# Patient Record
Sex: Female | Born: 1935 | Race: Black or African American | Hispanic: No | Smoking: Never smoker
Health system: Southern US, Community
[De-identification: ages and names within clinical notes are randomized; demographics above are authoritative.]

## PROBLEM LIST (undated history)

## (undated) DIAGNOSIS — I639 Cerebral infarction, unspecified: Secondary | ICD-10-CM

## (undated) DIAGNOSIS — T7840XA Allergy, unspecified, initial encounter: Secondary | ICD-10-CM

## (undated) DIAGNOSIS — Z801 Family history of malignant neoplasm of trachea, bronchus and lung: Secondary | ICD-10-CM

## (undated) DIAGNOSIS — K219 Gastro-esophageal reflux disease without esophagitis: Secondary | ICD-10-CM

## (undated) DIAGNOSIS — I10 Essential (primary) hypertension: Secondary | ICD-10-CM

## (undated) DIAGNOSIS — M4800 Spinal stenosis, site unspecified: Secondary | ICD-10-CM

## (undated) DIAGNOSIS — N390 Urinary tract infection, site not specified: Secondary | ICD-10-CM

## (undated) DIAGNOSIS — J189 Pneumonia, unspecified organism: Secondary | ICD-10-CM

## (undated) DIAGNOSIS — Z006 Encounter for examination for normal comparison and control in clinical research program: Secondary | ICD-10-CM

## (undated) DIAGNOSIS — Z8042 Family history of malignant neoplasm of prostate: Secondary | ICD-10-CM

## (undated) DIAGNOSIS — Z803 Family history of malignant neoplasm of breast: Secondary | ICD-10-CM

## (undated) DIAGNOSIS — I2510 Atherosclerotic heart disease of native coronary artery without angina pectoris: Secondary | ICD-10-CM

## (undated) DIAGNOSIS — M8580 Other specified disorders of bone density and structure, unspecified site: Secondary | ICD-10-CM

## (undated) DIAGNOSIS — R0600 Dyspnea, unspecified: Secondary | ICD-10-CM

## (undated) DIAGNOSIS — M1990 Unspecified osteoarthritis, unspecified site: Secondary | ICD-10-CM

## (undated) DIAGNOSIS — I5032 Chronic diastolic (congestive) heart failure: Secondary | ICD-10-CM

## (undated) HISTORY — DX: Family history of malignant neoplasm of breast: Z80.3

## (undated) HISTORY — DX: Essential (primary) hypertension: I10

## (undated) HISTORY — DX: Family history of malignant neoplasm of prostate: Z80.42

## (undated) HISTORY — DX: Encounter for examination for normal comparison and control in clinical research program: Z00.6

## (undated) HISTORY — DX: Atherosclerotic heart disease of native coronary artery without angina pectoris: I25.10

## (undated) HISTORY — DX: Urinary tract infection, site not specified: N39.0

## (undated) HISTORY — PX: ABDOMINAL HYSTERECTOMY: SHX81

## (undated) HISTORY — DX: Spinal stenosis, site unspecified: M48.00

## (undated) HISTORY — PX: LUMBAR DISC SURGERY: SHX700

## (undated) HISTORY — DX: Gastro-esophageal reflux disease without esophagitis: K21.9

## (undated) HISTORY — DX: Allergy, unspecified, initial encounter: T78.40XA

## (undated) HISTORY — PX: APPENDECTOMY: SHX54

## (undated) HISTORY — DX: Unspecified osteoarthritis, unspecified site: M19.90

## (undated) HISTORY — PX: CATARACT EXTRACTION: SUR2

## (undated) HISTORY — DX: Family history of malignant neoplasm of trachea, bronchus and lung: Z80.1

## (undated) HISTORY — DX: Cerebral infarction, unspecified: I63.9

## (undated) HISTORY — DX: Pneumonia, unspecified organism: J18.9

## (undated) HISTORY — DX: Other specified disorders of bone density and structure, unspecified site: M85.80

---

## 1983-06-11 DIAGNOSIS — I639 Cerebral infarction, unspecified: Secondary | ICD-10-CM

## 1983-06-11 HISTORY — DX: Cerebral infarction, unspecified: I63.9

## 1999-01-29 ENCOUNTER — Other Ambulatory Visit: Admission: RE | Admit: 1999-01-29 | Discharge: 1999-01-29 | Payer: Self-pay | Admitting: Family Medicine

## 1999-12-18 ENCOUNTER — Encounter: Admission: RE | Admit: 1999-12-18 | Discharge: 1999-12-18 | Payer: Self-pay | Admitting: Family Medicine

## 1999-12-18 ENCOUNTER — Encounter: Payer: Self-pay | Admitting: Family Medicine

## 2001-02-17 ENCOUNTER — Encounter: Admission: RE | Admit: 2001-02-17 | Discharge: 2001-02-17 | Payer: Self-pay | Admitting: Family Medicine

## 2001-02-17 ENCOUNTER — Encounter: Payer: Self-pay | Admitting: Family Medicine

## 2001-02-25 ENCOUNTER — Encounter: Admission: RE | Admit: 2001-02-25 | Discharge: 2001-03-26 | Payer: Self-pay | Admitting: Family Medicine

## 2001-05-17 ENCOUNTER — Encounter: Payer: Self-pay | Admitting: Emergency Medicine

## 2001-05-17 ENCOUNTER — Emergency Department (HOSPITAL_COMMUNITY): Admission: EM | Admit: 2001-05-17 | Discharge: 2001-05-17 | Payer: Self-pay | Admitting: Emergency Medicine

## 2001-07-08 ENCOUNTER — Ambulatory Visit (HOSPITAL_COMMUNITY): Admission: RE | Admit: 2001-07-08 | Discharge: 2001-07-08 | Payer: Self-pay | Admitting: *Deleted

## 2002-06-10 LAB — HM COLONOSCOPY

## 2002-07-20 ENCOUNTER — Encounter: Payer: Self-pay | Admitting: Family Medicine

## 2002-07-20 ENCOUNTER — Encounter: Admission: RE | Admit: 2002-07-20 | Discharge: 2002-07-20 | Payer: Self-pay | Admitting: Family Medicine

## 2002-08-24 LAB — HM DEXA SCAN

## 2002-08-25 ENCOUNTER — Emergency Department (HOSPITAL_COMMUNITY): Admission: EM | Admit: 2002-08-25 | Discharge: 2002-08-26 | Payer: Self-pay | Admitting: Emergency Medicine

## 2002-08-26 ENCOUNTER — Encounter: Payer: Self-pay | Admitting: Emergency Medicine

## 2002-08-26 ENCOUNTER — Encounter: Payer: Self-pay | Admitting: Internal Medicine

## 2002-10-12 ENCOUNTER — Encounter: Payer: Self-pay | Admitting: Neurosurgery

## 2002-10-12 ENCOUNTER — Ambulatory Visit (HOSPITAL_COMMUNITY): Admission: RE | Admit: 2002-10-12 | Discharge: 2002-10-12 | Payer: Self-pay | Admitting: Neurosurgery

## 2002-12-29 ENCOUNTER — Ambulatory Visit (HOSPITAL_COMMUNITY): Admission: RE | Admit: 2002-12-29 | Discharge: 2002-12-29 | Payer: Self-pay | Admitting: Gastroenterology

## 2003-08-08 ENCOUNTER — Other Ambulatory Visit: Admission: RE | Admit: 2003-08-08 | Discharge: 2003-08-08 | Payer: Self-pay | Admitting: Family Medicine

## 2003-08-08 ENCOUNTER — Encounter: Payer: Self-pay | Admitting: Family Medicine

## 2003-08-08 LAB — CONVERTED CEMR LAB: Pap Smear: NORMAL

## 2003-08-22 LAB — FECAL OCCULT BLOOD, GUAIAC: Fecal Occult Blood: NEGATIVE

## 2004-01-16 ENCOUNTER — Emergency Department (HOSPITAL_COMMUNITY): Admission: EM | Admit: 2004-01-16 | Discharge: 2004-01-17 | Payer: Self-pay | Admitting: Emergency Medicine

## 2004-01-24 ENCOUNTER — Encounter: Admission: RE | Admit: 2004-01-24 | Discharge: 2004-01-24 | Payer: Self-pay | Admitting: Family Medicine

## 2004-09-04 ENCOUNTER — Ambulatory Visit: Payer: Self-pay | Admitting: Family Medicine

## 2004-10-04 ENCOUNTER — Ambulatory Visit: Payer: Self-pay | Admitting: Family Medicine

## 2005-01-24 ENCOUNTER — Ambulatory Visit: Payer: Self-pay | Admitting: Internal Medicine

## 2005-05-22 ENCOUNTER — Ambulatory Visit: Payer: Self-pay | Admitting: Family Medicine

## 2005-09-17 ENCOUNTER — Ambulatory Visit: Payer: Self-pay | Admitting: Family Medicine

## 2005-09-25 ENCOUNTER — Ambulatory Visit: Payer: Self-pay | Admitting: Family Medicine

## 2005-09-25 ENCOUNTER — Encounter: Admission: RE | Admit: 2005-09-25 | Discharge: 2005-09-25 | Payer: Self-pay | Admitting: *Deleted

## 2005-11-27 ENCOUNTER — Ambulatory Visit: Payer: Self-pay | Admitting: Family Medicine

## 2005-11-28 ENCOUNTER — Encounter: Admission: RE | Admit: 2005-11-28 | Discharge: 2005-11-28 | Payer: Self-pay | Admitting: Family Medicine

## 2005-12-02 ENCOUNTER — Encounter: Admission: RE | Admit: 2005-12-02 | Discharge: 2005-12-02 | Payer: Self-pay | Admitting: Family Medicine

## 2005-12-17 ENCOUNTER — Ambulatory Visit: Payer: Self-pay | Admitting: Family Medicine

## 2005-12-27 ENCOUNTER — Encounter: Admission: RE | Admit: 2005-12-27 | Discharge: 2005-12-27 | Payer: Self-pay | Admitting: Neurosurgery

## 2006-01-01 ENCOUNTER — Ambulatory Visit: Payer: Self-pay | Admitting: Family Medicine

## 2006-01-07 ENCOUNTER — Encounter: Payer: Self-pay | Admitting: Family Medicine

## 2006-01-07 LAB — CONVERTED CEMR LAB: Hgb A1c MFr Bld: 5.8 %

## 2006-01-29 ENCOUNTER — Ambulatory Visit (HOSPITAL_COMMUNITY): Admission: RE | Admit: 2006-01-29 | Discharge: 2006-01-30 | Payer: Self-pay | Admitting: Neurosurgery

## 2006-03-25 ENCOUNTER — Ambulatory Visit: Payer: Self-pay | Admitting: Family Medicine

## 2006-12-08 ENCOUNTER — Encounter: Payer: Self-pay | Admitting: Family Medicine

## 2006-12-08 DIAGNOSIS — I10 Essential (primary) hypertension: Secondary | ICD-10-CM | POA: Insufficient documentation

## 2006-12-08 DIAGNOSIS — Z8619 Personal history of other infectious and parasitic diseases: Secondary | ICD-10-CM | POA: Insufficient documentation

## 2006-12-08 DIAGNOSIS — E7800 Pure hypercholesterolemia, unspecified: Secondary | ICD-10-CM | POA: Insufficient documentation

## 2006-12-08 DIAGNOSIS — K219 Gastro-esophageal reflux disease without esophagitis: Secondary | ICD-10-CM

## 2006-12-09 ENCOUNTER — Ambulatory Visit: Payer: Self-pay | Admitting: Family Medicine

## 2006-12-09 ENCOUNTER — Encounter: Admission: RE | Admit: 2006-12-09 | Discharge: 2006-12-09 | Payer: Self-pay | Admitting: Family Medicine

## 2006-12-16 LAB — CONVERTED CEMR LAB
BUN: 14 mg/dL (ref 6–23)
GFR calc Af Amer: 79 mL/min
Hemoglobin: 12.7 g/dL (ref 12.0–15.0)
Lymphocytes Relative: 36.2 % (ref 12.0–46.0)
MCHC: 32.7 g/dL (ref 30.0–36.0)
MCV: 88 fL (ref 78.0–100.0)
Neutrophils Relative %: 52.2 % (ref 43.0–77.0)
RBC: 4.41 M/uL (ref 3.87–5.11)
Sed Rate: 10 mm/hr (ref 0–25)
WBC: 5.4 10*3/uL (ref 4.5–10.5)

## 2006-12-24 ENCOUNTER — Ambulatory Visit: Payer: Self-pay | Admitting: Family Medicine

## 2007-01-16 ENCOUNTER — Encounter: Payer: Self-pay | Admitting: Family Medicine

## 2007-01-22 ENCOUNTER — Encounter: Admission: RE | Admit: 2007-01-22 | Discharge: 2007-01-22 | Payer: Self-pay | Admitting: Specialist

## 2007-01-28 ENCOUNTER — Encounter: Admission: RE | Admit: 2007-01-28 | Discharge: 2007-01-28 | Payer: Self-pay | Admitting: Specialist

## 2007-03-02 ENCOUNTER — Ambulatory Visit: Payer: Self-pay | Admitting: Family Medicine

## 2007-03-12 ENCOUNTER — Inpatient Hospital Stay (HOSPITAL_COMMUNITY): Admission: RE | Admit: 2007-03-12 | Discharge: 2007-03-15 | Payer: Self-pay | Admitting: Specialist

## 2007-03-12 ENCOUNTER — Encounter (INDEPENDENT_AMBULATORY_CARE_PROVIDER_SITE_OTHER): Payer: Self-pay | Admitting: Specialist

## 2007-03-27 ENCOUNTER — Ambulatory Visit: Payer: Self-pay | Admitting: Family Medicine

## 2007-03-30 LAB — CONVERTED CEMR LAB
ALT: 12 units/L (ref 0–35)
AST: 15 units/L (ref 0–37)
Albumin: 3.5 g/dL (ref 3.5–5.2)
BUN: 14 mg/dL (ref 6–23)
CO2: 33 meq/L — ABNORMAL HIGH (ref 19–32)
Calcium: 9.3 mg/dL (ref 8.4–10.5)
Cholesterol: 170 mg/dL (ref 0–200)
Creatinine, Ser: 0.9 mg/dL (ref 0.4–1.2)
GFR calc non Af Amer: 65 mL/min
Glucose, Bld: 92 mg/dL (ref 70–99)
HDL: 37.5 mg/dL — ABNORMAL LOW (ref >39.0)
LDL Cholesterol: 108 mg/dL — ABNORMAL HIGH (ref 0–99)
Sodium: 144 meq/L (ref 135–145)

## 2007-04-11 ENCOUNTER — Emergency Department (HOSPITAL_COMMUNITY): Admission: EM | Admit: 2007-04-11 | Discharge: 2007-04-11 | Payer: Self-pay | Admitting: Emergency Medicine

## 2007-08-14 ENCOUNTER — Encounter: Payer: Self-pay | Admitting: Family Medicine

## 2007-08-24 ENCOUNTER — Encounter (INDEPENDENT_AMBULATORY_CARE_PROVIDER_SITE_OTHER): Payer: Self-pay | Admitting: *Deleted

## 2008-04-07 ENCOUNTER — Ambulatory Visit: Payer: Self-pay | Admitting: Family Medicine

## 2008-06-10 HISTORY — PX: OTHER SURGICAL HISTORY: SHX169

## 2008-06-24 ENCOUNTER — Ambulatory Visit: Payer: Self-pay | Admitting: Family Medicine

## 2008-06-24 DIAGNOSIS — M810 Age-related osteoporosis without current pathological fracture: Secondary | ICD-10-CM | POA: Insufficient documentation

## 2008-06-30 ENCOUNTER — Ambulatory Visit: Payer: Self-pay | Admitting: Family Medicine

## 2008-07-01 ENCOUNTER — Encounter (INDEPENDENT_AMBULATORY_CARE_PROVIDER_SITE_OTHER): Payer: Self-pay | Admitting: *Deleted

## 2008-07-01 LAB — CONVERTED CEMR LAB
AST: 18 units/L (ref 0–37)
CO2: 32 meq/L (ref 19–32)
Chloride: 106 meq/L (ref 96–112)
Eosinophils Absolute: 0 10*3/uL (ref 0.0–0.7)
Eosinophils Relative: 0.8 % (ref 0.0–5.0)
GFR calc non Af Amer: 75 mL/min
Glucose, Bld: 105 mg/dL — ABNORMAL HIGH (ref 70–99)
HDL: 50.9 mg/dL (ref >39.0)
Hemoglobin: 13.1 g/dL (ref 12.0–15.0)
Lymphocytes Relative: 38.8 % (ref 12.0–46.0)
MCV: 85.3 fL (ref 78.0–100.0)
Monocytes Relative: 8.9 % (ref 3.0–12.0)
Neutro Abs: 2.9 10*3/uL (ref 1.4–7.7)
Neutrophils Relative %: 50.8 % (ref 43.0–77.0)
Phosphorus: 3.2 mg/dL (ref 2.3–4.6)
RBC: 4.53 M/uL (ref 3.87–5.11)
TSH: 1.14 microintl units/mL (ref 0.35–5.50)
Total CHOL/HDL Ratio: 4.2
Triglycerides: 101 mg/dL (ref 0–149)
VLDL: 20 mg/dL (ref 0–40)

## 2008-07-13 ENCOUNTER — Encounter: Payer: Self-pay | Admitting: Family Medicine

## 2008-07-13 ENCOUNTER — Telehealth (INDEPENDENT_AMBULATORY_CARE_PROVIDER_SITE_OTHER): Payer: Self-pay | Admitting: *Deleted

## 2008-08-15 ENCOUNTER — Encounter: Payer: Self-pay | Admitting: Family Medicine

## 2008-08-18 ENCOUNTER — Encounter (INDEPENDENT_AMBULATORY_CARE_PROVIDER_SITE_OTHER): Payer: Self-pay | Admitting: *Deleted

## 2008-09-22 ENCOUNTER — Ambulatory Visit: Payer: Self-pay | Admitting: Family Medicine

## 2008-09-22 DIAGNOSIS — E559 Vitamin D deficiency, unspecified: Secondary | ICD-10-CM | POA: Insufficient documentation

## 2008-09-26 LAB — CONVERTED CEMR LAB: Vit D, 25-Hydroxy: 73 ng/mL (ref 30–89)

## 2008-10-28 ENCOUNTER — Ambulatory Visit (HOSPITAL_COMMUNITY): Admission: RE | Admit: 2008-10-28 | Discharge: 2008-10-29 | Payer: Self-pay | Admitting: Orthopaedic Surgery

## 2008-11-10 ENCOUNTER — Telehealth: Payer: Self-pay | Admitting: Family Medicine

## 2008-11-10 ENCOUNTER — Encounter: Payer: Self-pay | Admitting: Family Medicine

## 2009-01-04 ENCOUNTER — Ambulatory Visit: Payer: Self-pay | Admitting: Family Medicine

## 2009-02-28 ENCOUNTER — Ambulatory Visit: Payer: Self-pay | Admitting: Family Medicine

## 2009-02-28 LAB — CONVERTED CEMR LAB
Cholesterol: 189 mg/dL (ref 0–200)
LDL Cholesterol: 123 mg/dL — ABNORMAL HIGH (ref 0–99)
Triglycerides: 104 mg/dL (ref 0.0–149.0)

## 2009-03-01 LAB — CONVERTED CEMR LAB: Vit D, 25-Hydroxy: 48 ng/mL (ref 30–89)

## 2009-04-11 ENCOUNTER — Ambulatory Visit: Payer: Self-pay | Admitting: Family Medicine

## 2009-04-18 ENCOUNTER — Encounter: Payer: Self-pay | Admitting: Family Medicine

## 2009-04-24 ENCOUNTER — Telehealth: Payer: Self-pay | Admitting: Family Medicine

## 2009-04-25 ENCOUNTER — Ambulatory Visit: Payer: Self-pay | Admitting: Family Medicine

## 2009-04-26 ENCOUNTER — Encounter: Admission: RE | Admit: 2009-04-26 | Discharge: 2009-04-26 | Payer: Self-pay | Admitting: Family Medicine

## 2009-04-26 LAB — CONVERTED CEMR LAB
Albumin: 4.3 g/dL (ref 3.5–5.2)
CO2: 32 meq/L (ref 19–32)
Calcium: 9.7 mg/dL (ref 8.4–10.5)
Chloride: 102 meq/L (ref 96–112)
Potassium: 3.9 meq/L (ref 3.5–5.1)

## 2009-07-08 ENCOUNTER — Emergency Department (HOSPITAL_COMMUNITY): Admission: EM | Admit: 2009-07-08 | Discharge: 2009-07-08 | Payer: Self-pay | Admitting: Family Medicine

## 2009-07-19 ENCOUNTER — Ambulatory Visit: Payer: Self-pay | Admitting: Family Medicine

## 2009-07-19 LAB — CONVERTED CEMR LAB
Bacteria, UA: 0
Bilirubin Urine: NEGATIVE
Blood in Urine, dipstick: NEGATIVE
Casts: 0 /LPF
Glucose, Urine, Semiquant: NEGATIVE
Ketones, urine, test strip: NEGATIVE
Nitrite: NEGATIVE
Protein, U semiquant: NEGATIVE
RBC / HPF: 0
Specific Gravity, Urine: 1.015
Urine crystals, microscopic: 0 /HPF
Urobilinogen, UA: 0.2
WBC Urine, dipstick: NEGATIVE
WBC, UA: 0 {cells}/[HPF]
Yeast, UA: 0
pH: 6

## 2009-07-25 ENCOUNTER — Telehealth: Payer: Self-pay | Admitting: Family Medicine

## 2009-07-26 ENCOUNTER — Emergency Department (HOSPITAL_COMMUNITY): Admission: EM | Admit: 2009-07-26 | Discharge: 2009-07-27 | Payer: Self-pay | Admitting: Emergency Medicine

## 2009-08-16 ENCOUNTER — Encounter: Admission: RE | Admit: 2009-08-16 | Discharge: 2009-08-16 | Payer: Self-pay | Admitting: Specialist

## 2009-11-30 ENCOUNTER — Encounter: Admission: RE | Admit: 2009-11-30 | Discharge: 2009-11-30 | Payer: Self-pay | Admitting: Specialist

## 2010-03-08 ENCOUNTER — Ambulatory Visit: Payer: Self-pay | Admitting: Family Medicine

## 2010-03-09 LAB — CONVERTED CEMR LAB: Vit D, 25-Hydroxy: 83 ng/mL (ref 30–89)

## 2010-03-12 LAB — CONVERTED CEMR LAB
BUN: 16 mg/dL (ref 6–23)
Basophils Absolute: 0 10*3/uL (ref 0.0–0.1)
Bilirubin, Direct: 0.1 mg/dL (ref 0.0–0.3)
Calcium: 10 mg/dL (ref 8.4–10.5)
Creatinine, Ser: 1 mg/dL (ref 0.4–1.2)
Eosinophils Absolute: 0 10*3/uL (ref 0.0–0.7)
Eosinophils Relative: 0.6 % (ref 0.0–5.0)
Glucose, Bld: 95 mg/dL (ref 70–99)
HDL: 51.6 mg/dL (ref >39.00)
MCV: 88.9 fL (ref 78.0–100.0)
Monocytes Absolute: 0.6 10*3/uL (ref 0.1–1.0)
Neutrophils Relative %: 56.4 % (ref 43.0–77.0)
Platelets: 190 10*3/uL (ref 150.0–400.0)
RDW: 12.8 % (ref 11.5–14.6)
Sed Rate: 10 mm/hr (ref 0–22)
Sodium: 141 meq/L (ref 135–145)
Total Bilirubin: 0.7 mg/dL (ref 0.3–1.2)
Total CHOL/HDL Ratio: 4
VLDL: 21.2 mg/dL (ref 0.0–40.0)
WBC: 5.5 10*3/uL (ref 4.5–10.5)

## 2010-05-01 ENCOUNTER — Ambulatory Visit: Payer: Self-pay | Admitting: Internal Medicine

## 2010-05-01 LAB — CONVERTED CEMR LAB
Glucose, Urine, Semiquant: NEGATIVE
Ketones, urine, test strip: NEGATIVE
Urobilinogen, UA: 0.2
pH: 7.5

## 2010-05-02 ENCOUNTER — Encounter: Payer: Self-pay | Admitting: Family Medicine

## 2010-06-15 ENCOUNTER — Ambulatory Visit: Admit: 2010-06-15 | Payer: Self-pay | Admitting: Family Medicine

## 2010-07-03 ENCOUNTER — Ambulatory Visit
Admission: RE | Admit: 2010-07-03 | Discharge: 2010-07-03 | Payer: Self-pay | Source: Home / Self Care | Attending: Family Medicine | Admitting: Family Medicine

## 2010-07-03 DIAGNOSIS — K5909 Other constipation: Secondary | ICD-10-CM | POA: Insufficient documentation

## 2010-07-10 NOTE — Progress Notes (Signed)
Summary: refill request for macrobid  Phone Note Refill Request Message from:  [employer]  Refills Requested: Medication #1:  NITROFURANTOIN MACROCRYSTAL 100 MG  CAPS take by mouth as directed prn   Last Refilled: 05/17/2010 Electronic request from [facility] [address].  Initial call taken by: Lowella Petties CMA,  July 25, 2009 2:15 PM  Follow-up for Phone Call        please call her - why does she need refil please? Follow-up by: Judith Part MD,  July 25, 2009 2:34 PM  Additional Follow-up for Phone Call Additional follow up Details #1::        She is not having a problem right now but Dr. Patsi Sears prescribed these and she has been keeping some on hand.  Lugene Fuquay CMA (AAMA)  July 25, 2009 3:16 PM   px written on EMR for call in  Additional Follow-up by: Judith Part MD,  July 25, 2009 3:20 PM    Additional Follow-up for Phone Call Additional follow up Details #2::    Medication phoned to [facility] [address] [facility] as instructed. Lewanda Rife LPN  July 25, 2009 4:57 PM   New/Updated Medications: NITROFURANTOIN MACROCRYSTAL 100 MG  CAPS (NITROFURANTOIN MACROCRYSTAL) take by mouth as directed prn Prescriptions: NITROFURANTOIN MACROCRYSTAL 100 MG  CAPS (NITROFURANTOIN MACROCRYSTAL) take by mouth as directed prn  #30 x 1   Entered and Authorized by:   Judith Part MD   Signed by:   Lewanda Rife LPN on 12/04/9483   Method used:   Telephoned to ...       [facility] [address] [phone]* (retail)       [address]       [place], [place]  [address]       Ph: [phone]       Fax: [phone]   RxID:   [id number]    Past Medical History:    GERD    Hypertension    spinal stenosis    osteopenia    frequent utis - keeps px for macrobid on hand ---------------------------------Dr Patsi Sears urologist

## 2010-07-10 NOTE — Assessment & Plan Note (Signed)
Summary: F/U ON INFECTION/DLO   Vital Signs:  Patient profile:   77 year old female Height:      61 inches Weight:      162.75 pounds BMI:     30.86 Temp:     98.5 degrees F oral Pulse rate:   64 / minute Pulse rhythm:   152regular BP sitting:   128 / 80  (left arm) Cuff size:   regular  Vitals Entered By: Lewanda Rife LPN (July 19, 2009 3:07 PM)  History of Present Illness: here to f/u for uti she had in ER on 1/29 for uti  macrobid and pyridium were px  is feeling better overall- no more urinary symptoms  her urine is clear today   is not sleeping well  from severe pain in her leg - for 2-3 years  does not know what to do about it  L leg just hurts severely   ? if coming from her back  last ortho appt was quite a while ago --  2 surgeries did not help  no numbness or weakness  worse in the left leg now -- is really both legs now  does not feel like going anywhere        Allergies: 1)  * Vantin 2)  Biaxin 3)  Amoxicillin 4)  Asa 5)  * Furosemide  Past History:  Past Medical History: Last updated: 2008/07/02 GERD Hypertension spinal stenosis osteopenia  Past Surgical History: Last updated: 04/25/2009 Hysterectomy- partial, still has one ovary EGD Back surgery (1985) Cath (07/08/2001) Chest CT (08/26/2002) Abd CT- normal (08/26/2002) Dexa- osteopenia, femoral neck -2.3 (08/24/2002) MRI- spinal stenosis- lumbar, with slip (11/2005) Carotid dopplers (12/2005) Colonoscopy (2004) rotator cuff surgery 2010  in clinical trial ([facility]) -- 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 Eyes:  Denies blurring and eye irritation. CV:  Denies chest pain or discomfort and palpitations. Resp:  Denies cough and shortness of breath. GI:  Denies abdominal pain and change in bowel habits. GU:  Denies dysuria, genital sores, and urinary frequency. MS:  Complains of joint pain, low back pain, and stiffness; denies joint redness, joint swelling, cramps, and muscle weakness. Derm:  Denies lesion(s), poor wound healing, and rash. Neuro:  Denies numbness, tingling, and weakness. Heme:  Denies abnormal bruising and bleeding.  Physical Exam  General:  Well-developed,well-nourished,in no acute distress; alert,appropriate and cooperative throughout examination Head:  normocephalic, atraumatic, and no abnormalities observed.   Eyes:  vision grossly intact, pupils equal, pupils round, and pupils reactive to light.   Neck:  supple with full rom and no masses or thyromegally, no JVD or carotid bruit  no bony tenderness  Lungs:  Normal respiratory effort, chest expands symmetrically. Lungs are clear to auscultation, no crackles or wheezes. Heart:  Normal rate and regular rhythm. S1 and S2 normal without gallop, murmur, click, rub or other extra sounds. Abdomen:  no suprapubic tenderness or fullness felt  Msk:  LS not tender some tenderness SI joints  some pain - in hip rotation no CVA tenderness  Pulses:  R and L carotid,radial,femoral,dorsalis pedis and posterior tibial pulses are full and equal bilaterally Extremities:  No clubbing, cyanosis, edema, or deformity noted with normal full range of motion of all joints.   Neurologic:  strength normal in all extremities and sensation intact to light touch.   Skin:  Intact without suspicious lesions or rashes Cervical Nodes:  No lymphadenopathy noted Inguinal Nodes:  No significant adenopathy Psych:  nl affect    Impression &  Recommendations:  Problem # 1:  HIP PAIN (ICD-719.45) Assessment Deteriorated ongoing hip and leg pain - chronic and worsening will try inc her gabapentin to 200 for better sleep with caution f/u with ortho The following medications were removed from the medication list:    Celebrex 100 Mg Caps (Celecoxib) .Marland Kitchen... 1 by mouth once daily with food as needed pain Her updated medication list for this problem includes:    Adult Aspirin Ec Low Strength 81 Mg Tbec (Aspirin) .Marland Kitchen... Take one by mouth daily    Flexeril 10 Mg Tabs (Cyclobenzaprine hcl) .Marland Kitchen... 1/2 to 1 by mouth three times a day as needed pain or muscle spasm  Orders: Orthopedic Referral (Ortho)  Problem # 2:  UTI (ICD-599.0) Assessment: New  resolved after tx with macrobid symptoms gone and neg ua  adv to call if symptoms return  Her updated medication list for this problem includes:    Nitrofurantoin Macrocrystal 100 Mg Caps (Nitrofurantoin macrocrystal) .Marland Kitchen... Take by mouth as directed prn  Orders: UA Dipstick W/ Micro (manual) ([address])  Complete Medication List: 1)  Diltiazem Hcl Cr 120 Mg Cp12 (Diltiazem hcl) .... Take one by mouth daily 2)  Hydrochlorothiazide 25 Mg Tabs (Hydrochlorothiazide) .... Take one by mouth daily 3)  Klor-con M10 10 Meq Tbcr (Potassium chloride crys cr) .... Take one by mouth daily 4)  Adult Aspirin Ec Low Strength 81 Mg Tbec (Aspirin) .... Take one by mouth daily 5)  Nitrofurantoin Macrocrystal 100 Mg Caps (Nitrofurantoin macrocrystal) .... Take by mouth as directed prn 6)  Flexeril 10 Mg Tabs (Cyclobenzaprine hcl) .... 1/2 to 1 by mouth three times a day as needed pain or muscle spasm 7)  Gabapentin 100 Mg Caps (Gabapentin) .... 2 by mouth qhs 8)  Triamcinolone Acetonide 0.1 % Crea (Triamcinolone acetonide) .... Apply to affected area once daily as needed rash -- do not use for more than 2 weeks  Patient Instructions: 1)  try increasing your gabapentin to 2 pills (200 mg) at bedtime for your pain   2)  we will refer you to orthopedics at check out  3)  update me if urine symptoms return  Current Allergies (reviewed today): * VANTIN BIAXIN AMOXICILLIN ASA * FUROSEMIDE  Laboratory Results   Urine Tests  Date/Time Received: July 19, 2009 3:08 PM  Date/Time Reported: July 19, 2009 3:08 PM   Routine Urinalysis   Color: yellow Appearance: Clear Glucose: negative   (Normal Range: Negative) Bilirubin: negative   (Normal Range: Negative) Ketone: negative   (Normal Range: Negative) Spec. Gravity: 1.015   (Normal Range: 1.003-1.035) Blood: negative   (Normal Range: Negative) pH: 6.0   (Normal Range: 5.0-8.0) Protein: negative   (Normal Range: Negative) Urobilinogen: 0.2   (Normal Range: 0-1) Nitrite: negative   (Normal Range: Negative) Leukocyte Esterace: negative   (Normal Range: Negative)  Urine Microscopic WBC/HPF: 0 RBC/HPF: 0 Bacteria/HPF: 0 Mucous/HPF: few Epithelial/HPF: 3-5 Crystals/HPF: 0 Casts/LPF: 0 Yeast/HPF: 0 Other: 0

## 2010-07-10 NOTE — Assessment & Plan Note (Signed)
Summary: NECK PAIN/CLE   Vital Signs:  Patient profile:   77 year old female Height:      61 inches Weight:      150.25 pounds BMI:     28.49 Temp:     98.4 degrees F oral Pulse rate:   64 / minute Pulse rhythm:   regular BP sitting:   128 / 76  (left arm) Cuff size:   regular  Vitals Entered By: Lewanda Rife LPN (March 08, 2010 10:16 AM) CC: Rt eye pain and rt side of face hurts and runs down neck   History of Present Illness: has been having pain on the R side of face for about a year and getting worse pain behind eye and on top that goes down side of face and neck sharp at times some days better than others  occ pain med -- that Dr Otelia Sergeant gave her -- hydrocodone apap only when it is really bad   still also chronic pain in leg -- cannot have another back surgery  claritin does not help   pain level 7 at most   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" had surgery years ago  never right since  always blurry -- and was told" she is going blind in it "   jaw hurts a bit   no sinus symptoms except occ sneezing  no congestion or colored d/c  no fever   Allergies: 1)  * Vantin 2)  Biaxin 3)  Amoxicillin 4)  Asa 5)  * Furosemide  Past History:  Past Medical History: Last updated: 07/25/2009 GERD Hypertension spinal stenosis osteopenia frequent utis - keeps px for macrobid on hand ---------------------------------Dr Patsi Sears urologist  Past Surgical History: Last updated: 04/25/2009 Hysterectomy- partial, still has one ovary EGD Back surgery (1985) Cath (07/08/2001) Chest CT (08/26/2002) Abd CT- normal (08/26/2002) Dexa- osteopenia, femoral neck -2.3 (08/24/2002) MRI- spinal stenosis- lumbar, with slip (11/2005) Carotid dopplers (12/2005) Colonoscopy (2004) rotator cuff surgery 2010  in clinical trial ([facility]) -- 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 Eyes:  Complains of eye irritation and eye pain; denies blurring, discharge, double vision, light sensitivity, and red eye. ENT:  Complains of earache; denies postnasal drainage, ringing in ears, sinus pressure, and sore throat. CV:  Denies chest pain or discomfort and palpitations. Resp:  Denies cough, shortness of breath, and wheezing. GI:  Denies abdominal pain, nausea, and vomiting. MS:  Complains of stiffness. Derm:  Denies itching, lesion(s), poor wound healing, and rash. Neuro:  Complains of headaches and visual disturbances; denies difficulty with concentration, disturbances in coordination, numbness, poor balance, tingling, and tremors. Psych:  Denies anxiety and depression. Endo:  Denies cold intolerance, excessive thirst, excessive urination, and heat intolerance. Heme:  Denies abnormal bruising and bleeding.   Impression & Recommendations:  Problem # 1:  HEADACHE (ICD-784.0) Assessment New headache and R sided neck and facial pain- ongoing with baseline poor vision in that eye need to rule out temporal arteritis other things in differential incl radiculopathy from neck dz/ trigeminal neuralgia/ tmj sed rate and cbc  today and update  urged to try muscle relaxer for neck pain Her updated medication list for this problem includes:    Adult Aspirin Ec Low Strength 81 Mg Tbec (Aspirin) .Marland Kitchen... Take one by mouth daily    Hydrocodone-acetaminophen 5-325 Mg Tabs (Hydrocodone-acetaminophen) ..... One tablet by mouth every 6 hours as needed.  Orders: TLB-CBC Platelet - w/Differential  (85025-CBCD) TLB-Hepatic/Liver Function Pnl (80076-HEPATIC) TLB-TSH (Thyroid Stimulating Hormone) (84443-TSH) TLB-Sedimentation Rate (ESR) (85652-ESR) T-Vitamin D (25-Hydroxy) ([address]-60454)  Problem # 2:  VITAMIN D DEFICIENCY (ICD-268.9) Assessment: Unchanged lab today pt has been compliant with supplement Orders: TLB-CBC Platelet - w/Differential (85025-CBCD) TLB-Hepatic/Liver Function Pnl (80076-HEPATIC) TLB-TSH (Thyroid Stimulating Hormone) (84443-TSH) TLB-Sedimentation Rate (ESR) (85652-ESR) T-Vitamin D (25-Hydroxy) (09811-91478) Specimen Handling (29562)  Problem # 3:  Hx of HYPERCHOLESTEROLEMIA (ICD-272.0) Assessment: Unchanged  lab today and update  diet has imp with some wt loss  Orders: TLB-CBC Platelet - w/Differential (85025-CBCD) TLB-Hepatic/Liver Function Pnl (80076-HEPATIC) TLB-TSH (Thyroid Stimulating Hormone) (84443-TSH) TLB-Sedimentation Rate (ESR) (85652-ESR) T-Vitamin D (25-Hydroxy) (13086-57846)  Labs Reviewed: SGOT: 17 (02/28/2009)   SGPT: 14 (02/28/2009)   HDL:45.50 (02/28/2009), 50.9 (06/30/2008)  LDL:123 (02/28/2009), DEL (06/30/2008)  Chol:189 (02/28/2009), 212 (06/30/2008)  Trig:104.0 (02/28/2009), 101 (06/30/2008)  Problem # 4:  HYPERTENSION (ICD-401.9) this seems well controlled lab today Her updated medication list for this problem includes:    Diltiazem Hcl Cr 120 Mg Cp12 (Diltiazem hcl) .Marland Kitchen... Take one by mouth daily    Hydrochlorothiazide 25 Mg Tabs (Hydrochlorothiazide) .Marland Kitchen... Take one by mouth daily  Orders: TLB-CBC Platelet - w/Differential (85025-CBCD) TLB-Hepatic/Liver Function Pnl (80076-HEPATIC) TLB-TSH (Thyroid Stimulating Hormone) (84443-TSH) TLB-Sedimentation Rate (ESR) (85652-ESR) T-Vitamin D (25-Hydroxy) (96295-28413)  Complete Medication List: 1)  Diltiazem Hcl Cr 120 Mg Cp12 (Diltiazem hcl) .... Take one by mouth daily 2)  Hydrochlorothiazide 25 Mg Tabs (Hydrochlorothiazide) .... Take one by mouth daily 3)  Klor-con M10 10  Meq Tbcr (Potassium chloride crys cr) .... Take one by mouth daily 4)  Adult Aspirin Ec Low Strength 81 Mg Tbec (Aspirin) .... Take one by mouth daily 5)  Nitrofurantoin Macrocrystal 100 Mg Caps (Nitrofurantoin macrocrystal) .... Take by mouth as directed prn 6)  Flexeril 10 Mg Tabs (Cyclobenzaprine hcl) .... 1/2 to 1 by mouth three times a day as needed pain or muscle spasm 7)  Gabapentin 100 Mg Caps (Gabapentin) .... 2 by mouth qhs 8)  Triamcinolone Acetonide 0.1 % Crea (Triamcinolone acetonide) .... Apply to affected area once daily as needed rash -- do not use for more than 2 weeks 9)  Vitamin D3 1000 Unit Tabs (Cholecalciferol) .... Take 1 tablet by mouth once a day 10)  Hydrocodone-acetaminophen 5-325 Mg Tabs (Hydrocodone-acetaminophen) .... One tablet by mouth every 6 hours as needed. 11)  Methocarban 500mg   .... One tablet by mouth every 6 hours as needed  Other Orders: Venipuncture (24401) TLB-Lipid Panel (80061-LIPID) TLB-Renal Function Panel (80069-RENAL) Flu Vaccine 46yrs + MEDICARE PATIENTS (U2725) Administration Flu vaccine - MCR (D6644)  Patient Instructions: 1)  try a warm compress on face and neck 2)  try muscle relaxer instead of pain pill (caution of sedation)  3)  we are checking labs today  4)  when those come back will make a plan and update you  5)  other labs for for HTN and cholesterol and vitamin D also   Current Allergies (reviewed today): * VANTIN BIAXIN AMOXICILLIN ASA * FUROSEMIDE      Flu Vaccine Consent Questions     Do you have a history of severe allergic reactions  to this vaccine? no    Any prior history of allergic reactions to egg and/or gelatin? no    Do you have a sensitivity to the preservative Thimersol? no    Do you have a past history of Guillan-Barre Syndrome? no    Do you currently have an acute febrile illness? no    Have you ever had a severe reaction to latex? no    Vaccine information given and explained to patient? yes    Are  you currently pregnant? no    Lot Number:[id number]   Exp Date:12/08/2010   Site Given  Left Deltoid IMflu   Lewanda Rife LPN  March 08, 2010 10:51 AM

## 2010-07-10 NOTE — Assessment & Plan Note (Signed)
Summary: ?UTI/CLE   Vital Signs:  Patient profile:   77 year old female Height:      61 inches Weight:      152.25 pounds BMI:     28.87 Temp:     98.6 degrees F oral Pulse rate:   62 / minute Pulse rhythm:   regular BP sitting:   120 / 72  (left arm) Cuff size:   regular  Vitals Entered ByMelody Comas (May 01, 2010 10:12 AM) CC: uti   History of Present Illness: CC: UTI?  h/o uTIs in past.  Now having burning pain at end of stream.  + urgency.  no polyuria.  h/o cysts removed from bladder.  + L flank/hip pain (h/o back surgery in past)  No hematuria.  no fevers/chills, abd pain, n/v.  supposed to be on macrobid daily per Clark, but not regularly taking.  Last took macrobid yesterday.  Current Medications (verified): 1)  Diltiazem Hcl Cr 120 Mg  Cp12 (Diltiazem Hcl) .... Take One By Mouth Daily 2)  Hydrochlorothiazide 25 Mg  Tabs (Hydrochlorothiazide) .... Take One By Mouth Daily 3)  Klor-Con M10 10 Meq  Tbcr (Potassium Chloride Crys Cr) .... Take One By Mouth Daily 4)  Adult Aspirin Ec Low Strength 81 Mg  Tbec (Aspirin) .... Take One By Mouth Daily 5)  Nitrofurantoin Macrocrystal 100 Mg  Caps (Nitrofurantoin Macrocrystal) .... Take By Mouth As Directed Prn 6)  Flexeril 10 Mg  Tabs (Cyclobenzaprine Hcl) .... 1/2 To 1 By Mouth Three Times A Day As Needed Pain or Muscle Spasm 7)  Vitamin D3 1000 Unit Tabs (Cholecalciferol) .... Take 2  Tablets  By Mouth Once A Day 8)  Hydrocodone-Acetaminophen 5-325 Mg Tabs (Hydrocodone-Acetaminophen) .... One Tablet By Mouth Every 6 Hours As Needed.  Allergies: 1)  ! Gabapentin (Gabapentin) 2)  * Vantin 3)  Biaxin 4)  Amoxicillin 5)  Asa 6)  * Furosemide  Past History:  Past Medical History: Last updated: 07/25/2009 GERD Hypertension spinal stenosis osteopenia frequent utis - keeps px for macrobid on hand ---------------------------------Dr Patsi Sears urologist  Social History: Last updated: 06/24/2008 Marital  Status: Married Children: 6 Occupation: retired non smoker  Review of Systems       per HPI  Physical Exam  General:  Well-developed,well-nourished,in no acute distress; alert,appropriate and cooperative throughout examination Lungs:  Normal respiratory effort, chest expands symmetrically. Lungs are clear to auscultation, no crackles or wheezes. Heart:  Normal rate and regular rhythm. S1 and S2 normal without gallop, murmur, click, rub or other extra sounds. Abdomen:  no suprapubic tenderness.  no cva tenderness.  NABS, NTND Pulses:  2+ rad pulses Extremities:  no pedal edema   Impression & Recommendations:  Problem # 1:  UTI (ICD-599.0) Encouraged to push clear liquids, get enough rest. To be seen in 10 days if no improvement, sooner if worse.  advised to hold macrobid while on cipro.  treat for 7 days given pt endorses L flank pain.  Her updated medication list for this problem includes:    Nitrofurantoin Macrocrystal 100 Mg Caps (Nitrofurantoin macrocrystal) .Marland Kitchen... Take by mouth as directed prn    Ciprofloxacin Hcl 500 Mg Tabs (Ciprofloxacin hcl) .Marland Kitchen... Take one twice daily  Orders: UA Dipstick W/ Micro (manual) (62952) Specimen Handling (99000) T-Culture, Urine ([id number])  Complete Medication List: 1)  Diltiazem Hcl Cr 120 Mg Cp12 (Diltiazem hcl) .... Take one by mouth daily 2)  Hydrochlorothiazide 25 Mg Tabs (Hydrochlorothiazide) .... Take one by mouth daily 3)  Klor-con M10 10 Meq Tbcr (Potassium chloride crys cr) .... Take one by mouth daily 4)  Adult Aspirin Ec Low Strength 81 Mg Tbec (Aspirin) .... Take one by mouth daily 5)  Nitrofurantoin Macrocrystal 100 Mg Caps (Nitrofurantoin macrocrystal) .... Take by mouth as directed prn 6)  Flexeril 10 Mg Tabs (Cyclobenzaprine hcl) .... 1/2 to 1 by mouth three times a day as needed pain or muscle spasm 7)  Vitamin D3 1000 Unit Tabs (Cholecalciferol) .... Take 2  tablets  by mouth once a day 8)  Hydrocodone-acetaminophen  5-325 Mg Tabs (Hydrocodone-acetaminophen) .... One tablet by mouth every 6 hours as needed. 9)  Ciprofloxacin Hcl 500 Mg Tabs (Ciprofloxacin hcl) .... Take one twice daily  Patient Instructions: 1)  looks like infection. 2)   treat with course of cipro twice daily for 7 days. 3)  stop macrobid while on cipro 4)  push water and cranberry juice 5)  good to see you today, call clinic wit hquestions or if not improving in next few days.  return sooner if any fevers, nausea, or worsening abdominal pain. Prescriptions: CIPROFLOXACIN HCL 500 MG TABS (CIPROFLOXACIN HCL) take one twice daily  #14 x 0   Entered and Authorized by:   Eustaquio Boyden  MD   Signed by:   Eustaquio Boyden  MD on 05/01/2010   Method used:   Electronically to        [facility] [phone]* (retail)       [address]       [place], [place]  [address]       Ph: [phone]       Fax: [phone]   RxID:   [id number]    Orders Added: 1)  Est. Patient Level III [24401] 2)  UA Dipstick W/ Micro (manual) [81000] 3)  Specimen Handling [99000] 4)  T-Culture, Urine [[id number]]    Current Allergies (reviewed today): ! GABAPENTIN (GABAPENTIN) * VANTIN BIAXIN AMOXICILLIN ASA * FUROSEMIDE   Laboratory Results   Urine Tests  Date/Time Received: May 01, 2010 10:26 AM Date/Time Reported: May 01, 2010 10:26 AM  Routine Urinalysis   Color: yellow Appearance: Cloudy Glucose: negative   (Normal Range: Negative) Bilirubin: negative   (Normal Range: Negative) Ketone: negative   (Normal Range: Negative) Spec. Gravity: 1.010   (Normal Range: 1.003-1.035) Blood: trace-intact   (Normal Range: Negative) pH: 7.5   (Normal Range: 5.0-8.0) Protein: negative   (Normal Range: Negative) Urobilinogen: 0.2   (Normal Range: 0-1) Nitrite: negative   (Normal Range: Negative) Leukocyte Esterace: large   (Normal Range: Negative)  Urine Microscopic WBC/HPF: TNTC RBC/HPF: 4-6 Bacteria/HPF: tr Mucous/HPF:  no Epithelial/HPF: rare Crystals/HPF: 0 Casts/LPF: 0 Yeast/HPF: 0    Comments: read by ...................Eustaquio Boyden  MD  May 01, 2010 10:36 AM  UCx sent.

## 2010-07-12 ENCOUNTER — Ambulatory Visit (INDEPENDENT_AMBULATORY_CARE_PROVIDER_SITE_OTHER): Payer: Medicare HMO

## 2010-07-12 ENCOUNTER — Encounter: Payer: Self-pay | Admitting: Family Medicine

## 2010-07-12 DIAGNOSIS — I10 Essential (primary) hypertension: Secondary | ICD-10-CM

## 2010-07-18 NOTE — Assessment & Plan Note (Signed)
Summary: Nurse visit BP check   Nurse Visit   Vital Signs:  Patient profile:   77 year old female Height:      61 inches Weight:      152.75 pounds BMI:     28.97 Temp:     97.9 degrees F oral Pulse rate:   60 / minute Pulse rhythm:   regular BP sitting:   142 / 84  (left arm) Cuff size:   regular  Vitals Entered By: Lewanda Rife LPN (July 12, 2010 9:43 AM)  Serial Vital Signs/Assessments:  Time      Position  BP       Pulse  Resp  Temp     By                     140/85                         Judith Part MD  CC: BP check Comments Pt came for BP check per Dr Royden Purl request. Pt has been compliant with meds. Has occasional dizziness.   Patient Instructions: 1)  I spoke to pt - bp is better here and at home 2)  will avoid dieters tea and use miralax instead  3)  will continue to monitor and let me know if any spikes    Allergies: 1)  ! Gabapentin (Gabapentin) 2)  * Vantin 3)  Biaxin 4)  Amoxicillin 5)  Asa 6)  * Furosemide  Orders Added: 1)  Est. Patient Level I [04540]

## 2010-07-18 NOTE — Assessment & Plan Note (Signed)
Summary: PROBLEM MOVING BOWELS / LFW   Vital Signs:  Patient profile:   77 year old female Height:      61 inches Weight:      150.25 pounds BMI:     28.49 Temp:     98.2 degrees F oral Pulse rate:   68 / minute Pulse rhythm:   regular BP sitting:   166 / 96  (left arm) Cuff size:   regular  Vitals Entered By: Lewanda Rife LPN (July 03, 2010 10:18 AM)  Serial Vital Signs/Assessments:  Time      Position  BP       Pulse  Resp  Temp     By                     150/85                         Judith Part MD  CC: Constipation and h/a on and off for 2 weeks   History of Present Illness: here for constipation and headache   in past - fall had headache R with neck and facial pain sed rate nl at 10 other labs ok  that headache is better   nagging headaches on and off for 2 wk  bp is up today -- has not been checking at home  took her med   colonosc 04-- thinks it was normal - no problems   been constipated for years  last month or 2 terrible  is coming out in lumps and balls - hard to pass drinks lots of water is eating prunes  uses 2 softener pills every day  drank some diet tea to help bowels mover - that helps a little  has tried miralax  occ cramping but no blood in stool    Allergies: 1)  ! Gabapentin (Gabapentin) 2)  * Vantin 3)  Biaxin 4)  Amoxicillin 5)  Asa 6)  * Furosemide  Past History:  Past Medical History: Last updated: 07/25/2009 GERD Hypertension spinal stenosis osteopenia frequent utis - keeps px for macrobid on hand ---------------------------------Dr Patsi Sears urologist  Past Surgical History: Last updated: 04/25/2009 Hysterectomy- partial, still has one ovary EGD Back surgery (1985) Cath (07/08/2001) Chest CT (08/26/2002) Abd CT- normal (08/26/2002) Dexa- osteopenia, femoral neck -2.3 (08/24/2002) MRI- spinal stenosis- lumbar, with slip (11/2005) Carotid dopplers (12/2005) Colonoscopy (2004) rotator cuff surgery 2010  in  clinical trial ([facility]) -- 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 Eyes:  Denies blurring and eye irritation. CV:  Denies chest pain or discomfort, lightheadness, palpitations, and shortness of breath with exertion. Resp:  Denies cough, shortness of breath, and wheezing. GI:  Complains of constipation; denies abdominal pain, bloody stools, dark tarry stools, gas, indigestion, nausea, and vomiting. GU:  Denies hematuria, urinary frequency, and urinary hesitancy. MS:  Denies cramps, muscle weakness, and stiffness. Derm:  Denies itching, lesion(s), poor wound healing, and rash. Neuro:  Complains of headaches; denies difficulty with concentration, disturbances in coordination, falling down, inability to speak,  memory loss, numbness, sensation of room spinning, tingling, tremors, visual disturbances, and weakness. Psych:  Complains of anxiety; stressed caring for husband. Endo:  Denies cold intolerance, excessive thirst, excessive urination, and heat intolerance. Heme:  Denies abnormal bruising and bleeding.  Physical Exam  General:  overweight but generally well appearing  Head:  normocephalic, atraumatic, and no abnormalities observed.   Eyes:  vision grossly intact, pupils equal, pupils round, and pupils reactive to light.  no conjunctival pallor, injection or icterus  Nose:  no nasal discharge.   Mouth:  pharynx pink and moist.   Neck:  supple with full rom and no masses or thyromegally, no JVD or carotid bruit    Chest Wall:  No deformities, masses, or tenderness noted. Lungs:  Normal respiratory effort, chest expands symmetrically. Lungs are clear to auscultation, no crackles or wheezes. Heart:  Normal rate and regular rhythm. S1 and S2 normal without gallop, murmur, click, rub or other extra sounds. Abdomen:  Bowel sounds positive,abdomen soft and non-tender without masses, organomegaly or hernias noted. no renal bruits  Msk:  No deformity or scoliosis noted of thoracic or lumbar spine.   Pulses:  2+ rad pulses Extremities:  no pedal edema Neurologic:  cranial nerves II-XII intact, sensation intact to light touch, gait normal, and DTRs symmetrical and normal.   Skin:  Intact without suspicious lesions or rashes Cervical Nodes:  No lymphadenopathy noted Inguinal Nodes:  No significant adenopathy Psych:  normal affect, talkative and pleasant    Impression & Recommendations:  Problem # 1:  HEADACHE (ICD-784.0) Assessment Deteriorated this is a new frontal headache - mild and intermittent  could be related to bp - see below will stop the dieters tea -- and re check next thurs  Her updated medication list for this problem includes:    Adult Aspirin Ec Low Strength 81 Mg Tbec (Aspirin) .Marland Kitchen... Take one by mouth daily    Hydrocodone-acetaminophen 5-325 Mg Tabs (Hydrocodone-acetaminophen) ..... One tablet by mouth every 6 hours as needed.  Problem # 2:  HYPERTENSION (ICD-401.9) Assessment: Deteriorated  worse despite med ? if stimulant in dieter's tea  will get ingredients for [place]  stop that  home bp checks daily nurse visit in 10 days if still high will adj tx this may be causing ha  Her updated medication list for this problem includes:    Diltiazem Hcl Cr 120 Mg Cp12 (Diltiazem hcl) .Marland Kitchen... Take one by mouth daily    Hydrochlorothiazide 25 Mg Tabs (Hydrochlorothiazide) .Marland Kitchen... Take one by mouth daily  BP today: 166/96-- re check 150/85 at res  Prior BP: 120/72 (05/01/2010)  Labs  Reviewed: K+: 4.2 (03/08/2010) Creat: : 1.0 (03/08/2010)   Chol: 196 (03/08/2010)   HDL: 51.60 (03/08/2010)   LDL: 123 (03/08/2010)   TG: 106.0 (03/08/2010)  Problem # 3:  CONSTIPATION, CHRONIC (ICD-564.09) Assessment: New getting worse wth time  outlined plan for more fiber and water and exercise start miralax every day  continue stool softener stop dieters tea check in 10 d   Complete Medication List: 1)  Diltiazem Hcl Cr 120 Mg Cp12 (Diltiazem hcl) .... Take one by mouth daily 2)  Hydrochlorothiazide 25 Mg Tabs (Hydrochlorothiazide) .... Take one by mouth daily 3)  Klor-con M10 10 Meq Tbcr (Potassium chloride crys cr) .... Take one by mouth daily 4)  Adult Aspirin Ec Low Strength 81 Mg Tbec (Aspirin) .... Take one by mouth daily 5)  Nitrofurantoin Macrocrystal 100 Mg Caps (Nitrofurantoin macrocrystal) .... Take by mouth as directed  as needed 6)  Flexeril 10 Mg Tabs (Cyclobenzaprine hcl) .... 1/2 to 1 by mouth three times a day as needed pain or muscle spasm 7)  Vitamin D3 1000 Unit Tabs (Cholecalciferol) .... Take 2  tablets  by mouth once a day 8)  Hydrocodone-acetaminophen 5-325 Mg Tabs (Hydrocodone-acetaminophen) .... One tablet by mouth every 6 hours as needed. 9)  Fish Oil 1000 Mg Caps (Omega-3 fatty acids) .... Take 1 capsule by mouth once a day 10)  Different Otc Laxatives(pt Cannot Remember Names  .... Otc as directed. 11)  Dieters Super Tea Misc (Nutritional supp - diet aids) .... Otc as directed for constipation  Patient Instructions: 1)  get miralax over the counter - and take daily with water as directed 2)  continue your stool softerner 3)  drink lots of water 4)  exercise  5)  keep up good fruit and veggie intake 6)  call back or drop off ingredient list for dieters tea  7)  stop the dieters tea (? making bp high ) 8)  check blood pressure at home at least once daily when relaxed  9)  schedule nurse visit for a week from thursday (when her husband comes in for  visit)    Orders Added: 1)  Est. Patient Level IV [[address]]    Current Allergies (reviewed today): ! GABAPENTIN (GABAPENTIN) * VANTIN BIAXIN AMOXICILLIN ASA * FUROSEMIDE

## 2010-08-09 LAB — HM MAMMOGRAPHY: HM Mammogram: NORMAL

## 2010-08-20 ENCOUNTER — Encounter: Payer: Self-pay | Admitting: Family Medicine

## 2010-08-24 ENCOUNTER — Encounter (INDEPENDENT_AMBULATORY_CARE_PROVIDER_SITE_OTHER): Payer: Self-pay | Admitting: *Deleted

## 2010-08-26 LAB — URINE CULTURE: Colony Count: 5000

## 2010-08-26 LAB — POCT URINALYSIS DIP (DEVICE)
Bilirubin Urine: NEGATIVE
Glucose, UA: NEGATIVE mg/dL
Ketones, ur: NEGATIVE mg/dL
Specific Gravity, Urine: 1.01 (ref 1.005–1.030)

## 2010-08-28 NOTE — Letter (Signed)
Summary: Results Follow up Letter  [place] [facility]  [address] [place], [place] [address]   Phone: [phone]  Fax: [phone]    08/24/2010 MRN: [id number]     Sara [name] [address] [place], [place]  [address]  [place]    Dear [name],  The following are the results of your recent test(s):  Test         Result    Pap Smear:        Normal _____  Not Normal _____ Comments: ______________________________________________________ Cholesterol: LDL(Bad cholesterol):         Your goal is less than:         HDL (Good cholesterol):       Your goal is more than: Comments:  ______________________________________________________ Mammogram:        Normal __X___  Not Normal _____ Comments: Repeat in 1 year  ___________________________________________________________________ Hemoccult:        Normal _____  Not normal _______ Comments:    _____________________________________________________________________ Other Tests:    We routinely do not discuss normal results over the telephone.  If you desire a copy of the results, or you have any questions about this information we can discuss them at your next office visit.   Sincerely,  Roxy Manns MD

## 2010-09-18 LAB — BASIC METABOLIC PANEL
BUN: 14 mg/dL (ref 6–23)
Creatinine, Ser: 0.94 mg/dL (ref 0.4–1.2)
GFR calc non Af Amer: 58 mL/min — ABNORMAL LOW (ref >60)
Glucose, Bld: 90 mg/dL (ref 70–99)

## 2010-09-18 LAB — CBC
HCT: 38.8 % (ref 36.0–46.0)
Platelets: 195 10*3/uL (ref 150–400)
RDW: 13.1 % (ref 11.5–15.5)
WBC: 4.9 10*3/uL (ref 4.0–10.5)

## 2010-10-17 ENCOUNTER — Other Ambulatory Visit: Payer: Self-pay | Admitting: Family Medicine

## 2010-10-23 NOTE — Op Note (Signed)
Sara [name], Sara [name]        ACCOUNT NO.:  [id number]   MEDICAL RECORD NO.:  [id number]          PATIENT TYPE:  INP   LOCATION:  3034                         FACILITY:  [facility]   PHYSICIAN:  Kerrin Champagne, M.D.   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  The patient is status post central  decompressive laminectomy at L3 and L4 levels in the past.   POSTOPERATIVE DIAGNOSIS:  As above right L4-5 synovial cyst found to be  impressing on the thecal sac on the right side, affecting the right L5  nerve root.  Lateral recess stenosis, bilateral L4-5 and bilateral L5-S1  affecting both L4, L5, and L1 nerve roots.   PROCEDURE:  1. Bilateral lateral recess decompression redo the procedure above L4-      5 and redo procedure at L5-S1 bilateral.  2. Decompression of bilateral L4, bilateral L5, and bilateral S1 nerve      roots.  3. Resection of synovial cyst right L4-5 facette, off the posterior      lateral aspect of the thecal sac over the right side, L5 nerve root      region.  4. Posterolateral fusion L4-5 with local and VITOSS material local      bone graft.  5. Left L5 bone marrow aspirate.  6. L4-5 pedicle screw and rod fixation using a DePuy Monarch pedicle      screws length 6.25 x 45 mm; 40 mm rods.   SURGEON:  Kerrin Champagne, M.D.   ASSISTANT:  Wende Neighbors, P.A.-C   ANESTHESIA:  General via oral tracheal intubation, Quita Skye. Krista Blue, M.D.  Supplemented with local infiltration Marcaine 1/2% 1:200,000 epinephrine  20 mL.   FINDINGS:  Bilateral lateral recess stenosis L4-5, L5-S1, right L4-5  synovial cyst.   ESTIMATED BLOOD LOSS:  400 mL, Cell Saver returned 200 mL.   COMPLICATIONS:  None.   DRAINS:  Foley to  straight drain, Hemovac x1.   FINDINGS:  Tissue resistance values:  Left L4 screw 43, right L4 screw  28.  Left L5 screw 28, right L5 screw 29.  The patient showed no  significant changes during intraoperative neuro monitoring.  Cell saver  was used during the case.   HISTORY OF PRESENT ILLNESS:  The patient is a 77 year old female has  undergone previous decompressive laminectomy procedure for spinal  stenosis 2 years ago.  Persistent back pain, discomfort, difficulty  standing, and ambulating.  She has undergone extensive evaluation with  both MR and myelogram.  Plain radiographs demonstrating  spondylolisthesis that reduces with flexion and extends to a grade 1  spondylolisthesis L4-5.  The patient found to have lateral recess  narrowing in both L4-5 and L5-S1.  Intraoperative findings, as above.  Most remarkable is a synovial cyst at right L4-5, significant thecal sac  compression, bilateral L5 and S1 nerve root entrapment secondary to  residual lateral recess stenosis.  Foraminal  entrapment right L4 greater  than the left L4 nerve roots.   DESCRIPTION OF PROCEDURE:  After adequate general anesthesia with the  patient on the [facility] spine table, all pressure points well-padded, and  all pads were adjusted.  The patient had TED stockings thigh-high with  neural electrodes placed for intraoperative neural monitoring.  Foley  catheter placed prior to turning in the prone position.   All pressure points were well-padded.  PAS stockings were used during  the case.  The patient had the area for expected surgery marked in the  preoperative holding area at L4-5 and L5-S1 designated.  The initial  incision was made after standard prep with DuraPrep solution, draped  with Ioban and Vi-Drape in the usual manner.   Incision ellipsing the old incision scar in the midline extending from  the S1-L2 the skin and subcutaneous layers after infiltration with  Marcaine 1/2% and 1:200,000  epinephrine.  Incision carried to the  spinous process of L2 superiorly.  The spinous process of L5 inferiorly,  and then in the midline carried to the depth equal to that of the lowest  portion of the spinous process, and the posterior aspect of the lamina  of L2 and L5.   Next, exposure carried out to the lateral masses and L3-4 and at L4-5  preserving the facette capsules at the L3-4 level and resecting the  capsules at the expected L4-5 level; and then performing exposure out to  the transverse process of L5 bilaterally, and the transverse process of  L4 bilaterally.  Bleeders controlled using monopolar-bipolar  electrocautery.  Gutter was then packed with sponges.  Exposure carried  down to the L5-S1 level, Cobbs were used to elevate the paralumbar  muscles, both sides.  Viper retractor was placed in the midline at the  upper lumbar segments, and Boss McCullough retractor was used to retract  the L5-S1 level.  Intraoperative C-arm used to identify the levels.  Clamps on the spinous process of L2 and of L5 identified these segments,  and a Penfield-4 placed into the facette at the L4-5 level on the right  side identifying this level.  This level was then marked using a marking  pen at L2 and L5.  Care was taken to ensure that these were the correct  levels.  The upper transverse process of L4 located at the L3-4 level,  and adjacent to the L3-4 facette, again, this capsule preserved this  level.  At the L4-5 level, the patient had resection of the facette  capsules both sides.  This measured exposure at the L5 transverse  process carried out laterally.   Using loupe magnification and headlamp then osteotome was used to resect  the medial border of the inferior articular process of L4 on the right  side.  This was carried down to the superior articular process of L5 on  the right side, and decompression was carried out resecting the medial  portions of the lamina of the facette at  this level.  Then 3 mm Kerrison  used to enter into the spinal canal over the superior aspect of the  lamina of L5.  Foraminotomy performed at the right L5 nerve root.  Then  an osteotome used to perform an osteotomy of the superior articular  process of L5 on the right side, green-sticking this area, and then  removing the 2-and-3-mm Kerrison's.  This decompressed the lateral  recess.  The patient then underwent a recess decompression at L3-4 on  the right side above, and this was then used to expose the L4 nerve root  at its entry point into the L4r neural foramen.  Resecting the reflected  portion of the ligamentum flavum overlying the nerve root and the  superior portion of the L5 superior articular process, decompressing  this nerve root quite nicely.   A generous amount of soft tissue still felt to be localized over the  lateral aspect of the thecal sac just at the medial portion of the L4-5  facette that had been previously resected.  This was then carefully  observed with a Penfield-4 and cushions to be easily dissected off the  thecal sac and represented synovial cyst. sent to pathology for  pathologic diagnosis.  Following its resection then, the thecal sac was  well decompressed on the right side.  A hockey stick neural probe could  be passed down the L4 neural foramen demonstrating its patency with  decompression of the L4 nerve root as well as the lateral recess of L5.  The L5 nerve root neural foramen appeared to be well decompressed.   Exposure of the L5-S1 level then carried out, using an osteotome  resecting the medial 20% of the inferior articular process of L5  exposing the superior articular process of S1.  Then exposing the  superior aspect and lamina of S1 using 2-and-3-mm Kerrison's to perform  foraminotomy over the right S1 nerve root.  Then osteotomizing the  medial facette and S1 superior articulate process on the right side,  decompressing the lateral recess,  removing the ligament of flavum where  it was reflected here.  This decompressed the lateral recess and allowed  for evaluation of the neural foramen both S1 and L5 on the right side.  Blunt tip neural probe could be easily passed out both of these  demonstrating the patency of decompression.   This was then similarly performed on the left side.  Moving to the left  side of the patient, first a lateral recess decompression was carried  down on the left side at L4-5 using an osteotome to resect residual  portions of the medial articular process on the left side at L4-5 along  with lamina that was residual here.  Decompressing the lateral recess,  by resecting portions of the medial portion of this inferior articular  process of L4, and performing foraminotomy over the L5 nerve root by  resecting portions of the superior aspect of the lamina of L5 and then  out lateral over the L5 nerve root, decompressing this nerve within its  foramen.  Identifying the medial aspect of pedicle of L5, using  osteotome then to resect, perform osteotomy in the medial portion of the  L5, superior articular process on the left side.  This was then removed  using 2-and-3-mm Kerrison's decompressing the lateral recess here, and  opening the spinal canal quite nicely.  Hockey stick neural probe could  be passed out the L5 neural foramen, and blunt tip neural probe could be  passed out the L-4 neural foramen.  Decompression was carried up to the  level of the L3-4 facette to ensure that the entire lateral recess of  the L4 nerve root was well decompressed as well.   Again, a hockey stick neural probe could be passed out over the L4 nerve  root on the left side demonstrating patency and decompression of the  left L4 nerve root here.  Thrombin-soaked Gelfoam then placed into the  lateral recess on  the left side, right side at L4-5, right side of L5-  S1.  Final decompression of lateral recess carried out on the  left side  at L5-S1 performing an osteotomy of the inferior articular process of L5  on the left side, medial border, about 15-20% using an osteotome.  Identifying the superior portion of the S1 lamina, forming a  foraminotomy over the S1 nerve root with 2-and-3-mm Kerrison's; and  osteotomizing the medial aspect of the S1 superior articular process and  then resecting using 2-and-3-mm Kerrison's decompressing the left L5-S1  lateral recess.  When this was completed, then blunt tip neural probe  could be passed out the S1 neural foramen at L5, and neural foramen  demonstrated patencies of these foramen, decompressing the lateral  recess at that level.  Bone wax applied to the bleeding cancellus bone  surfaces, and thrombin-soaked Gelfoam placed as well as Cottonoids.   Next, the posterolateral regions bilaterally were further exposed with  removal of a small portion of muscle making a pocket for later bone  graft over the transverse process of L4 extending to L5 bilaterally.  The awl was then placed at the expected insertion area for the pedicle  screw on the left side at the L4 level, intersection of the L4  transverse process with the lateral aspect of the L4 pedicle, superior  to the process of L4.  Intraoperative C-arm used to identify the correct  position and alignment of the awl.   Next a blunt pedicle finder then used to probe the pedicle on the left  side with a correct degree of convergence and lordosis.  This was probed  to approximately 45 mm.  A 6.2 x 45 mm screw was chosen.  Tapping was  performed using a 5.5 tap decortication carried out over the transverse  process of L4 on the left side, and then local bone graft was then  harvested with resection of the facettes and lamina for the previous  decompression portion of the procedure was used bone graft the posterior  lateral region here.   After tapping, then checking the channel for the pedicle using a ball-  tipped  probe, ensuring patency of the pedicle with some sign of broach.  The 45 mm x 6.25 screw was placed without difficulty.  At the left L5  level, then, an awl was introduced; again, at the intersection of  transverse process of L5 with the lateral aspect of pedicle of L5.  Observed with C-arm fluoro to be in the correct position and alignment.  Blunt tip pedicle finder used to probe the pedicle to a depth of 45 mm.  Again, checking the pedicle with the ball-tipped probe assuring its  patency and no sign of broaching the cortex.  With this completed, then  bone marrow aspiration was carried out using the trocar provided.  The  trocar was introduced into the pedicle of L5 on the left side to a depth  of 45 mm.  Aspiration of dense bone marrow was carried out for the  VITOSS bone substitute.  This was then applied to the strip with 10 mL  of VITOSS.  Tapping was carried out at the L5 pedicle opening using a  5.5 tap; and checking the pedicle, again, with the ball-tipped probe to  ensure patency of the pedicle opening.  No sign of broaching cortex.  A  45 mm x 6.25 screw then placed on the left side at L5 following  decortication of the transverse  process of L5, introduction of local  bone graft over the transverse process in addition to the VITOSS  material.  Screw was placed without difficulty, and the correct degree  of convergence and lordosis observed on C-arm fluoro.  Final screws  placed on the right side, again, at L4 and L5 and we performed similar  to those on the left using the ball-tipped probe between each of the  procedures to ensure the patency of the pedicles.   An awl was used to make an entry point using C-arm fluoro to identify  this at the intersection of the transverse process at L4 with its  pedicle.  Then using the blunt-tip pedicle probe, probing the channel on  the right side at L4, then tapping 5 times, and then placing the  appropriate screw 45 mm x 6.25.  This was  done following decortication  of the transverse process, and application of VITOSS and local bone  graft here.  Final screw was placed on the right side at the L5 level;  and, again, using the awl for an initial entry point, I then used a  pedicle probe, and probed the channel with the correct degree of  convergence and lordosis.  Checking with the ball-tipped probe to ensure patency of the pedicle opening, no sign of broach.  Tapping with  appropriate 5 tap, and then placing a 45 mm x 6.25 screw after first  decorticating the transverse process on the right side of L5 as well.  The ball-tipped probe was used to probe each of the pedicle channels  between the steps of using the pedicle probe as well as tapping pedicles  following each step prior to insertion of the screw.  Following  placement of the screws bone grafting done, and each of the screws were  measured for soft tissue resistance.  Left L4 measured 43, the left L5  measured 28, right L4 measured 28, right L5 measured 29.  These appeared  to be adequate with soft tissue resistance, and the C-arm fluoro  identified the screws to be in good position and alignment.  Then 40-mm  rods were then placed into the screw fasteners after first loosening the  screw fasteners from their threaded portion of the screw using the  breaker provided.  Then with the caps reach of the fasteners were then  applied; and each of the fasteners were attached to the rod at 80 foot  pounds using the antitorque device.  Stabilization and instrumentation  then carried out.  Irrigation was performed.  All excess Gelfoam was  removed from lateral recesses at each level.  Bone wax applied to the  cancellous bone surfaces and excess bone wax removed.  Irrigation was  carried out.  Copious amounts of irrigant solution was used.  Final C-  arm views were obtained in AP and lateral planes demonstrating the rods  and screws in position and alignment.   The patient  then had closure of the incision.  First placing a medium  Hemovac drain in the depths of the incision actually on the right lower  lumbar spine, reapproximating the paralumbar muscles in the midline  loosely with interrupted #1 Vicryl sutures, reattaching the lumbodorsal  fascia to the spinous processes and interspinous ligaments where  possible with #1 Vicryl suture.  Deep subcu layers were approximated  after approximating the lumbodorsal fascia in the midline with  interrupted Vicryl sutures.  These were then closed with interrupted #1-  0 Vicryl sutures, and  the more superficial fascial layers closed with  interrupted 2-0 Vicryl suture.  The skin closed with a running subcu  stitch and 4-0 Vicryl.  The patient then had Dermabond applied.  Then  4x4s, ABD pad fixed to the skin with Hypafix tape.  The patient was then  returned to a supine position, reactivated, extubated, and returned to  the recovery room in satisfactory condition.      Kerrin Champagne, M.D.  Electronically Signed     JEN/MEDQ  D:  03/12/2007  T:  03/13/2007  Job:  [id number]

## 2010-10-23 NOTE — Op Note (Signed)
Sara [name], Sara [name]        ACCOUNT NO.:  [id number]   MEDICAL RECORD NO.:  [id number]          PATIENT TYPE:  AMB   LOCATION:  SDS                          FACILITY:  [facility]   PHYSICIAN:  Vanita Panda. Magnus Ivan, M.D.DATE OF BIRTH:  [birth year]   DATE OF PROCEDURE:  10/28/2008  DATE OF DISCHARGE:                               OPERATIVE REPORT   PREOPERATIVE DIAGNOSES:  Severe right shoulder impingement syndrome with  partial thickness subscapularis tendon rotator cuff tear and full-  thickness supraspinatus and infraspinatus rotator cuff tears.   POSTOPERATIVE DIAGNOSES:  Severe right shoulder impingement syndrome  with partial thickness subscapularis tendon rotator cuff tear and full-  thickness supraspinatus and infraspinatus rotator cuff tears.   PROCEDURE:  1. Right shoulder arthroscopy with extensor debridement.  2. Right shoulder arthroscopy subacromial decompression.  3. Right shoulder arthroscopically-assisted rotator cuff repair.   SURGEON:  Vanita Panda. Magnus Ivan, MD   ANESTHESIA:  1. Regional right shoulder block.  2. General.   ANTIBIOTICS:  IV Ancef 1 g.   BLOOD LOSS:  Minimal.   COMPLICATIONS:  None.   INDICATIONS:  Briefly, Ms. Sara [name] is a 77 year old active individual  who is right-hand-dominant.  She has had severe right shoulder pain for  sometime now.  She has been followed by Dr. Otelia Sergeant for her neck and felt  like the majority of her pain was related to her shoulder.  He has tried  injections over the last year and she did get relief with injections,  but continued to complain of shoulder weakness and recurrent pain.  An  MRI was obtained and it showed a significant full thickness rotator cuff  tear at the supraspinatus and infraspinatus tendons with retraction.  There was also partial tearing of this subscapularis tendon.  It was  recommended, she undergo right shoulder arthroscopy, debridement and  subacromial decompression in an attempt of  rotator cuff repair with the  understanding that if her tissues did not allow due to weakness of the  tissues, we would just perform the debridement.  The risk and benefits  are well understood by the patient.  She agrees to surgery.   DESCRIPTION OF PROCEDURE:  After informed consent obtained, appropriate  right shoulder was marked.  Anesthesia was obtained in right shoulder  block, then she was brought to the operating room, placed supine on the  operating table.  General anesthesia was then obtained.  She was then  fashioned in a beach chair position with appropriate padding and  positioning of the head and neck and padding of the nonoperative left  arm.  Her right arm was then prepped and draped with DuraPrep and  sterile drapes, and placed in in-line skeletal traction using 45 degrees  of forward flexion and 10 pounds of traction with neutral rotation.  A  time-out was called, and she was identified as the correct patient and  correct right shoulder.  I then made a posterior incision of the  posterolateral edge of the acromion and the arthroscope was inserted  into the glenohumeral joint.  Right away, you could see there was  significant tearing of the rotator cuff.  The subscapularis  tendon was  torn anteriorly, but there was plenty of fibers remaining, biceps tendon  was not present, and you could see there was a full thickness rotator  cuff tear.  An anterior portal was then made just lateral to the  coracoid process and a soft tissue ablation wand as well as the  arthroscopic shaver was inserted to carry out an extensive debridement  on the undersurface of the rotator cuff to mobilize this as well as in  the glenohumeral joint itself including fraying of the labrum in the  subscapularis.  I then entered the subacromial space of the posterior  portal and made a separate lateral portal.  Through the lateral portal,  I mobilized the rotator cuff using a soft-tissue ablation wand  and  arthroscopic shaver.  I performed a subacromial decompression with the  shaving of the undersurface of the acromion and a partial acromioplasty.  I removed significant bursitis tissue from the shoulder using a soft-  tissue ablation wand.  Once I was able to mobilize the rotator cuff, I  was able to clean the footprint using a high-speed bur to get bleeding  bone base.  I was able to grab the rotator cuff and pull it over to near-  resting position and she did have some integrity of her cuff.  I then  made two separate additional portals for suture management.  Next, I  placed a punch for a 5.5 Arthrex suture anchor and then placed a 5.5  bioabsorbable screw with two strands of FiberWire suture.  Then using a  Scorpion suture passer, I was able to pass horizontal mattress sutures  from the front of the rotator cuff for 1 set into back of the rotator  cuff for a set of sutures.  I then used sliding knot to tie both of  these knots down tightly.  Next, using a double-row technique, I put a  separate push lock into the subdeltoid area of the humeral head to bring  the sutures down to resting position.  I put the shoulder through  internal and external rotation, you could visualize that the rotator  cuff was intact.  I then removed all instrumentation and closed the  portal sites with interrupted 3-0 nylon suture.  Xeroform followed by  well-padded sterile dressing was applied, and the patient's shoulder was  placed in a shoulder abduction mobilizer sling.  The patient was  awakened, extubated, and taken to recovery room in stable condition.  All final counts were correct, and there were no complications noted.      Vanita Panda. Magnus Ivan, M.D.  Electronically Signed     CYB/MEDQ  D:  10/28/2008  T:  10/29/2008  Job:  [id number]

## 2010-10-26 NOTE — Cardiovascular Report (Signed)
[place]. [facility]  Patient:    Sara [name], GUDGEL Visit Number: [id number] MRN: [id number]          Service Type: EMS Location: [place] Attending Physician:  Cathren Laine Dictated by:   Daisey Must, M.D. [facility] Proc. Date: 07/08/01 Admit Date:  05/17/2001 Discharge Date: 05/17/2001   CC:         Laurita Quint, M.D. [facility]  Rollene Rotunda, M.D. [facility]  Cardiac Catheterization Laboratory   Cardiac Catheterization  PROCEDURES PERFORMED: Left heart catheterization with coronary angiography and left ventriculography.  INDICATIONS: The patient is a 77 year old woman, who has had progressive exertional dyspnea and chest discomfort. She was evaluated by Dr. Antoine Poche in the office and referred for cardiac catheterization to rule out coronary artery disease.  DESCRIPTION OF PROCEDURE: A 6 French sheath was placed in the right femoral artery.  Standard Judkins 6 French catheters were utilized.  Contrast was Omnipaque.  There were no complications.  RESULTS:  HEMODYNAMICS: Left ventricular pressure 140/20.  Aortic pressure 140/74. There was no aortic valve gradient.  LEFT VENTRICULOGRAM: Wall motion is normal. Ejection fraction is estimated at greater than or equal to 60%. There is trace mitral regurgitation.  CORONARY ARTERIOGRAPHY: (Right dominant).  Left main: Normal.  Left anterior descending: The left anterior descending artery has a 20% stenosis in the mid vessel. The LAD gives rise to two normal sized diagonal branches.  Left circumflex: The left circumflex is a large dominant vessel. There are minor luminal irregularities in the proximal vessel and a 20% stenosis in the mid vessel. The circumflex gives rise to a small OM-1, normal sized OM-2, normal sized first and second posterolateral branches, and a small to normal sized left posterior descending artery.  Right coronary artery: The right coronary artery is a small nondominant vessel.   The right coronary is normal by angiography.  IMPRESSION: 1. Normal left ventricular function systolic function. 2. No significant coronary artery disease identified. Dictated by:   Daisey Must, M.D. [facility] Attending Physician:  Cathren Laine DD:  07/08/01 TD:  07/09/01 Job: [id number]

## 2010-10-26 NOTE — Discharge Summary (Signed)
[name], [name]        ACCOUNT NO.:  [id number]   MEDICAL RECORD NO.:  [id number]          PATIENT TYPE:  INP   LOCATION:  5033                         FACILITY:  [facility]   PHYSICIAN:  Kerrin Champagne, M.D.   DATE OF BIRTH:  02-May-1932   DATE OF ADMISSION:  03/12/2007  DATE OF DISCHARGE:  03/15/2007                               DISCHARGE SUMMARY   ADMISSION DIAGNOSES:  1. Bilateral lateral recess stenosis, lumbar four-five and lumbar five-      sacral one, foraminal entrapment, bilateral lumbar four nerve roots      with degenerative spondylolisthesis, lumbar four-five.  2. Status post central decompressive laminectomy at lumbar three-four      levels in 2007.  3. Hypertension.   DISCHARGE DIAGNOSES:  1. Bilateral lateral recess stenosis, lumbar four-five and lumbar five-      sacral one, foraminal entrapment, bilateral lumbar four nerve roots      with degenerative spondylolisthesis, lumbar four-five.  2. Status post central decompressive laminectomy at lumbar three-four      levels in 2007.  3. Hypertension.  4. Posthemorrhagic anemia, requiring transfusion of 2 units of      autologous blood.   PROCEDURE:  1. On March 12, 2007, the patient underwent bilateral lateral recess      decompression redo above L4-5 and redo procedure at L5-S1      bilateral.  2. Decompression of bilateral L4, bilateral L5 and bilateral S1 nerve      roots.  3. Resection of synovial cyst, right L4-5 facet, off the      posterolateral aspect of the thecal sac over the right side at the      L5 nerve root region.  4. Posterolateral fusion L4-5 with local and vitos bone graft      material.  5. Left L5 bone marrow aspirate.  6. L4-5 pedicle screw and rod fixation.  This was performed by Dr.      Otelia Sergeant, assisted by Maud Deed, PA-C, under general anesthesia.   CONSULTATIONS:  None.   BRIEF HISTORY:  The patient is a 77 year old female, status post  previous decompression and  laminectomy for spinal stenosis at the L3-4  level in the past.  She has had persistent pain in the low back with  difficulty standing and ambulating.  Plain radiographs demonstrated  spondylolisthesis that reduced with flexion and extends to a grade 1  spondylolisthesis at L4-5.  MRI scan had indicated lateral recess  narrowing in both the L4-5 and L5-S1 levels.  She was also noted to have  a synovial cyst at the right L4-5 level with significant thecal sac  compression as well as bilateral L5 and S1 nerve root entrapment  secondary to residual lateral recess stenosis.  Foraminal entrapment  right L4 greater than the left L4 nerve roots.  It was felt that she  would benefit from surgical intervention and was admitted for the  procedure as stated above.   BRIEF HOSPITAL COURSE:  The patient tolerated the procedure under  general anesthesia without complications.  Postoperatively neurovascular  function of the lower extremities was noted to be intact.  Hemovac  drain  was discontinued and wound was healing well throughout the hospital  stay.  Diet was held until bowel function returned.  Once the patient  was having flatus and bowel movements, a regular diet was restarted.  Foley catheter was discontinued, and the patient was able to void.  Hemoglobin and hematocrit postoperatively dropped to 8.3, and the  patient was transfused with 2 units of packed red blood cells, which was  autologous, and hemoglobin returned to a value of 10.0.  The patient  received physical therapy for ambulation and gait training.  She was  able to use an Aspen LSO for ambulation, also used a walker for  ambulation.  Prior to discharge, she was ambulating in the hallway.  She  was able to demonstrate the ability to don and doff the Aspen LSO  safely.  She was ambulating 250 feet on the day of discharge.   PERTINENT LABORATORY VALUES:  Hemoglobin and hematocrit as stated above.  Chemistry studies on admission  within normal limits and repeat on  October 3 within normal limits as well.   PLAN:  The patient was discharged to her home.  She was given  prescription for Vicodin to use for pain and ferrous sulfate to take for  1 month for her anemia.  She will change her dressing daily and will be  allowed to shower.  She will wear her brace at all times when out of bed  and use a walker for ambulation.  The patient will follow up with Dr.  Otelia Sergeant 2 weeks from the date of surgery.  All questions encouraged and  answered.      Sara [name], P.A.      Kerrin Champagne, M.D.  Electronically Signed    SMV/MEDQ  D:  06/18/2007  T:  06/18/2007  Job:  [id number]

## 2010-11-16 ENCOUNTER — Other Ambulatory Visit: Payer: Self-pay | Admitting: Family Medicine

## 2010-11-16 NOTE — Telephone Encounter (Signed)
Pt needs to call for appt. 

## 2011-01-21 ENCOUNTER — Other Ambulatory Visit: Payer: Self-pay | Admitting: Family Medicine

## 2011-01-22 NOTE — Telephone Encounter (Signed)
[facility] [address] electronically request HCTZ 25mg , Taztia XT 120mg  and Potassium chloride #30 x1 refill on each. Note added for pt to call for appt.

## 2011-02-07 ENCOUNTER — Encounter: Payer: Self-pay | Admitting: Family Medicine

## 2011-02-08 ENCOUNTER — Encounter: Payer: Self-pay | Admitting: Family Medicine

## 2011-02-08 ENCOUNTER — Ambulatory Visit (INDEPENDENT_AMBULATORY_CARE_PROVIDER_SITE_OTHER)
Admission: RE | Admit: 2011-02-08 | Discharge: 2011-02-08 | Disposition: A | Discharge status: Home / Self Care | Payer: Medicare HMO | Source: Ambulatory Visit | Attending: Family Medicine | Admitting: Family Medicine

## 2011-02-08 ENCOUNTER — Ambulatory Visit (INDEPENDENT_AMBULATORY_CARE_PROVIDER_SITE_OTHER): Payer: Medicare HMO | Admitting: Family Medicine

## 2011-02-08 DIAGNOSIS — M25559 Pain in unspecified hip: Secondary | ICD-10-CM

## 2011-02-08 DIAGNOSIS — M25552 Pain in left hip: Secondary | ICD-10-CM

## 2011-02-08 DIAGNOSIS — N644 Mastodynia: Secondary | ICD-10-CM | POA: Insufficient documentation

## 2011-02-08 DIAGNOSIS — M25551 Pain in right hip: Secondary | ICD-10-CM | POA: Insufficient documentation

## 2011-02-08 MED ORDER — TRAMADOL HCL 50 MG PO TABS: 50.0000 mg | ORAL_TABLET | Freq: Three times a day (TID) | ORAL | Status: DC | PRN

## 2011-02-08 NOTE — Progress Notes (Signed)
Subjective:    Patient ID: Sara [name], female    DOB: 1931-10-21, 77 y.o.   MRN: [id number]  HPI Is having some breast pain and also L hip pain   Never had breast pain before  No hormones No longer in dementia study  Is a little worse on L side  No lumps  Mam 3/12 - was fine  No menopause medicines  No caffiene at all  Not more active lately   L hip pain -- always has trouble with it  Pain is in groin area L  No xrays recently- last in 2010 showed oa   Patient Active Problem List  Diagnoses  . VITAMIN D DEFICIENCY  . HYPERCHOLESTEROLEMIA  . HYPERTENSION  . GERD  . SPINAL STENOSIS  . OSTEOPENIA  . CONSTIPATION, CHRONIC  . Breast pain   Past Medical History  Diagnosis Date  . GERD (gastroesophageal reflux disease)   . Hypertension   . Spinal stenosis   . Osteopenia   . Frequent UTI   . Clinical trial participant     [facility] Genetic studies in familial dementia   Past Surgical History  Procedure Date  . Abdominal hysterectomy     partial has one ovary  . Brain surgery 1985  . Rotator cuff surgery 2010   History  Substance Use Topics  . Smoking status: Never Smoker   . Smokeless tobacco: Not on file  . Alcohol Use:    Family History  Problem Relation Age of Onset  . Cancer Mother     kidney tumor  . Hypertension Mother   . Diabetes Sister   . Cancer Brother     lung CA  . Diabetes Brother   . Diabetes Sister    Allergies  Allergen Reactions  . Amoxicillin     REACTION: reaction not known  . Aspirin     REACTION: GERD  . Cefpodoxime Proxetil     REACTION: reaction not known  . Clarithromycin     REACTION: reaction not known  . Furosemide     REACTION: doesn't help  . Gabapentin     REACTION: caused severe swellling   Current Outpatient Prescriptions on File Prior to Visit  Medication Sig Dispense Refill  . aspirin EC 81 MG tablet Take 81 mg by mouth daily.        . cholecalciferol (VITAMIN D) 1000 UNITS tablet Take 1,000  Units by mouth daily.       . hydrochlorothiazide 25 MG tablet TAKE ONE TABLET BY MOUTH EVERY DAY  30 tablet  1  . potassium chloride (K-DUR) 10 MEQ tablet TAKE ONE TABLET BY MOUTH EVERY DAY  30 tablet  1  . TAZTIA XT 120 MG 24 hr capsule TAKE ONE CAPSULE BY MOUTH EVERY DAY  30 each  1  . cyclobenzaprine (FLEXERIL) 10 MG tablet Take 5-10 mg by mouth 3 (three) times daily as needed.        . Omega-3 Fatty Acids (FISH OIL) 1000 MG CAPS Take 1 capsule by mouth daily.             Review of Systems Review of Systems  Constitutional: Negative for fever, appetite change, fatigue and unexpected weight change.  Eyes: Negative for pain and visual disturbance.  Respiratory: Negative for cough and shortness of breath.   Cardiovascular: Negative for cp or palpitations    Gastrointestinal: Negative for nausea, diarrhea and constipation.  Genitourinary: Negative for urgency and frequency. no vaginal discharge MSK pos  for hip pain- worse on L , radiates to groin  Skin: Negative for pallor or rash   Neurological: Negative for weakness, light-headedness, numbness and headaches.  Hematological: Negative for adenopathy. Does not bruise/bleed easily.  Psychiatric/Behavioral: Negative for dysphoric mood. The patient is not nervous/anxious.          Objective:   Physical Exam  Constitutional: She appears well-developed and well-nourished. No distress.  HENT:  Head: Normocephalic and atraumatic.  Eyes: Conjunctivae and EOM are normal. Pupils are equal, round, and reactive to light.  Neck: Normal range of motion. Neck supple. No JVD present. Carotid bruit is not present. Erythema present. No thyromegaly present.  Cardiovascular: Normal rate, regular rhythm and normal heart sounds.   Pulmonary/Chest: Effort normal and breath sounds normal. No respiratory distress. She has no wheezes.  Abdominal: Soft. Bowel sounds are normal. She exhibits no distension. There is no tenderness. There is no rebound.    Genitourinary: There is breast tenderness. No breast swelling, discharge or bleeding.       Generalized tenderness of outer quadrants of both breasts  No M or skin change or nipple d/c  Musculoskeletal: She exhibits tenderness. She exhibits no edema.       Pt has limited internal and external rotation of both hips - worse on the L with pain rad to the groin  No troch tenderness Some medial knee joint line tenderness- no swelling No LS tenderness today  Lymphadenopathy:    She has no cervical adenopathy.  Neurological: She is alert. She has normal reflexes. Coordination normal.  Skin: Skin is warm and dry. No rash noted. No erythema. No pallor.  Psychiatric: She has a normal mood and affect.          Assessment & Plan:

## 2011-02-08 NOTE — Patient Instructions (Signed)
X ray of hip today  Try the ultram (tramadol) as needed for more severe pain - but be careful of dizziness or sedation  Will make plan after xray report returns  Breast pain -- we will watch this  Avoid caffiene  Wear supportive bra  If any lumps or changes please let me know

## 2011-02-08 NOTE — Assessment & Plan Note (Addendum)
In pt with hx of fibrocystic breasts - but does not drink caffiene or take hormone replacement  Pain has been intermittent in past Mam utd Exam reassuring  Will continue to monitor and update if symptoms worsen or do not resolve

## 2011-02-10 ENCOUNTER — Encounter: Payer: Self-pay | Admitting: Family Medicine

## 2011-02-10 NOTE — Assessment & Plan Note (Addendum)
With pain in groin and limited rom hips Suspect OA has worsened  Xray today and update Will try some tramadol with caution for added pain relief  Want to avoid nsaids if possible

## 2011-03-18 ENCOUNTER — Other Ambulatory Visit: Payer: Self-pay | Admitting: Family Medicine

## 2011-03-19 LAB — POCT URINALYSIS DIP (DEVICE)
Bilirubin Urine: NEGATIVE
Nitrite: NEGATIVE
Protein, ur: 100 — AB
Urobilinogen, UA: 0.2
pH: 5.5

## 2011-03-21 LAB — HEMOGLOBIN AND HEMATOCRIT, BLOOD
HCT: 25.1 — ABNORMAL LOW
HCT: 26.2 — ABNORMAL LOW
Hemoglobin: 10 — ABNORMAL LOW
Hemoglobin: 8.3 — ABNORMAL LOW

## 2011-03-21 LAB — BASIC METABOLIC PANEL
BUN: 4 — ABNORMAL LOW
CO2: 31
Calcium: 8 — ABNORMAL LOW
Calcium: 9.8
Chloride: 106
Chloride: 104
Creatinine, Ser: 0.95
Creatinine, Ser: 0.92
GFR calc Af Amer: >60
GFR calc non Af Amer: >60
Glucose, Bld: 152 — ABNORMAL HIGH

## 2011-03-21 LAB — CROSSMATCH
ABO/RH(D): O POS
Antibody Screen: NEGATIVE

## 2011-03-21 LAB — CBC
MCV: 86.9
RBC: 4.1
WBC: 5.7

## 2011-03-21 LAB — ABO/RH: ABO/RH(D): O POS

## 2011-05-20 ENCOUNTER — Ambulatory Visit (INDEPENDENT_AMBULATORY_CARE_PROVIDER_SITE_OTHER): Payer: Medicare HMO | Admitting: Family Medicine

## 2011-05-20 DIAGNOSIS — Z23 Encounter for immunization: Secondary | ICD-10-CM

## 2011-08-21 ENCOUNTER — Encounter: Payer: Self-pay | Admitting: Family Medicine

## 2011-08-22 ENCOUNTER — Encounter: Payer: Self-pay | Admitting: *Deleted

## 2011-10-16 ENCOUNTER — Other Ambulatory Visit: Payer: Self-pay | Admitting: Family Medicine

## 2011-11-14 ENCOUNTER — Other Ambulatory Visit: Payer: Self-pay | Admitting: Family Medicine

## 2011-11-14 NOTE — Telephone Encounter (Signed)
Done

## 2012-01-14 ENCOUNTER — Encounter: Payer: Self-pay | Admitting: Family Medicine

## 2012-01-14 ENCOUNTER — Ambulatory Visit (INDEPENDENT_AMBULATORY_CARE_PROVIDER_SITE_OTHER): Payer: Medicare HMO | Admitting: Family Medicine

## 2012-01-14 VITALS — BP 122/66 | HR 61 | Temp 98.3°F | Wt 151.0 lb

## 2012-01-14 DIAGNOSIS — L299 Pruritus, unspecified: Secondary | ICD-10-CM | POA: Insufficient documentation

## 2012-01-14 DIAGNOSIS — I10 Essential (primary) hypertension: Secondary | ICD-10-CM

## 2012-01-14 LAB — RENAL FUNCTION PANEL
BUN: 15 mg/dL (ref 6–23)
CO2: 31 mEq/L (ref 19–32)
Creatinine, Ser: 0.9 mg/dL (ref 0.4–1.2)
GFR: 83.25 mL/min (ref >60.00)
Glucose, Bld: 99 mg/dL (ref 70–99)
Phosphorus: 3.3 mg/dL (ref 2.3–4.6)
Sodium: 139 mEq/L (ref 135–145)

## 2012-01-14 LAB — HEPATIC FUNCTION PANEL
AST: 18 U/L (ref 0–37)
Albumin: 4.3 g/dL (ref 3.5–5.2)
Alkaline Phosphatase: 79 U/L (ref 39–117)
Total Protein: 7.4 g/dL (ref 6.0–8.3)

## 2012-01-14 NOTE — Assessment & Plan Note (Signed)
bp in fair control at this time  No changes needed  Disc lifstyle change with low sodium diet and exercise   If itching does not resolve may need to consider changing her hctz Lab today

## 2012-01-14 NOTE — Assessment & Plan Note (Signed)
Lab today- hepatic/ renal Trial of zyrtec No rash seen - ? Allergy Change to non scented products Then update- if not imp will consider change of hctz

## 2012-01-14 NOTE — Progress Notes (Signed)
Subjective:    Patient ID: Sara [name], female    DOB: May 13, 1932, 78 y.o.   MRN: [id number]  HPI Is here with some itching -- ? Wondered if she has shingles   Started all over her body , no rash anywhere  It started about 4 months months   No new medicines  No new otc meds or vits or herbs  No new products in the past 4 mo Tide detergent Uses fabric softener sheets  Dial soap occ perfume   Lab Results  Component Value Date   ALT 13 03/08/2010   AST 19 03/08/2010   ALKPHOS 74 03/08/2010   BILITOT 0.7 03/08/2010     Is on hctz  Never had problems with it before   Patient Active Problem List  Diagnosis  . VITAMIN D DEFICIENCY  . HYPERCHOLESTEROLEMIA  . HYPERTENSION  . GERD  . SPINAL STENOSIS  . OSTEOPENIA  . CONSTIPATION, CHRONIC  . Breast pain  . Hip pain, left  . Itching   Past Medical History  Diagnosis Date  . GERD (gastroesophageal reflux disease)   . Hypertension   . Spinal stenosis   . Osteopenia   . Frequent UTI   . 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 Osteoarthritis     hips   Past Surgical History  Procedure Date  . Abdominal hysterectomy     partial has one ovary  . Brain surgery 1985  . Rotator cuff surgery 2010   History  Substance Use Topics  . Smoking status: Never Smoker   . Smokeless tobacco: Not on file  . Alcohol Use:    Family History  Problem Relation Age of Onset  . Cancer Mother     kidney tumor  . Hypertension Mother   . Diabetes Sister   . Cancer Brother     lung CA  . Diabetes Brother   . Diabetes Sister    Allergies  Allergen Reactions  . Amoxicillin     REACTION: reaction not known  . Aspirin     REACTION: GERD  . Cefpodoxime Proxetil     REACTION: reaction not known  . Clarithromycin     REACTION: reaction not known  . Furosemide     REACTION: doesn't help  . Gabapentin     REACTION: caused severe swellling   Current Outpatient Prescriptions on  File Prior to Visit  Medication Sig Dispense Refill  . aspirin EC 81 MG tablet Take 81 mg by mouth daily.        . cholecalciferol (VITAMIN D) 1000 UNITS tablet Take 1,000 Units by mouth daily.       . hydrochlorothiazide (HYDRODIURIL) 25 MG tablet TAKE ONE TABLET BY MOUTH EVERY DAY  30 tablet  6  . polyethylene glycol (MIRALAX / GLYCOLAX) packet Take 17 g by mouth daily.        . potassium chloride (K-DUR) 10 MEQ tablet TAKE ONE TABLET BY MOUTH EVERY DAY  30 tablet  6  . TAZTIA XT 120 MG 24 hr capsule TAKE ONE  BY MOUTH EVERY DAY  30 each  5     Review of Systems    Review of Systems  Constitutional: Negative for fever, appetite change, fatigue and unexpected weight change.  Eyes: Negative for pain and visual disturbance.  Respiratory: Negative for cough and shortness of breath.   Cardiovascular: Negative for cp or palpitations    Gastrointestinal: Negative for nausea,  diarrhea and constipation.  Genitourinary: Negative for urgency and frequency.  Skin: Negative for pallor or rash  pos for itching  Neurological: Negative for weakness, light-headedness, numbness and headaches.  Hematological: Negative for adenopathy. Does not bruise/bleed easily.  Psychiatric/Behavioral: Negative for dysphoric mood. The patient is not nervous/anxious.      Objective:   Physical Exam  Constitutional: She appears well-developed and well-nourished. No distress.  HENT:  Head: Normocephalic and atraumatic.  Mouth/Throat: Oropharynx is clear and moist. No oropharyngeal exudate.  Eyes: Conjunctivae and EOM are normal. Pupils are equal, round, and reactive to light. Right eye exhibits no discharge. Left eye exhibits no discharge. No scleral icterus.  Neck: Normal range of motion. Neck supple.  Cardiovascular: Normal rate, regular rhythm, normal heart sounds and intact distal pulses.  Exam reveals no gallop.   Pulmonary/Chest: Effort normal and breath sounds normal. She has no wheezes.  Abdominal: Soft.  Bowel sounds are normal. She exhibits no distension and no mass. There is no tenderness.       No HSM  Musculoskeletal: She exhibits no edema.  Lymphadenopathy:    She has no cervical adenopathy.  Neurological: She is alert. She has normal reflexes. No cranial nerve deficit. She exhibits normal muscle tone. Coordination normal.  Skin: Skin is warm and dry. No rash noted. No erythema. No pallor.       No rash Some dryness of skin Few excoriations  Psychiatric: She has a normal mood and affect.          Assessment & Plan:

## 2012-01-14 NOTE — Patient Instructions (Signed)
Get zytec 10 mg over the counter Take 1 daily at night for itching  Change soap to dove for sensitive skin  Use scent free products, and avoid perfume for now Stop all fabric softener Change detergent to tide FREE  If you are not better in 2 weeks - call and let me know , we may need to change one of your medicines   Labs today  The name of the shingles vaccine is Zostavax

## 2012-03-11 ENCOUNTER — Encounter: Payer: Self-pay | Admitting: Family Medicine

## 2012-03-11 ENCOUNTER — Ambulatory Visit (INDEPENDENT_AMBULATORY_CARE_PROVIDER_SITE_OTHER): Payer: Medicare HMO | Admitting: Family Medicine

## 2012-03-11 VITALS — BP 126/88 | HR 76 | Temp 101.1°F | Ht 62.0 in | Wt 149.8 lb

## 2012-03-11 DIAGNOSIS — J019 Acute sinusitis, unspecified: Secondary | ICD-10-CM | POA: Insufficient documentation

## 2012-03-11 MED ORDER — DOXYCYCLINE HYCLATE 100 MG PO TABS: 100.0000 mg | ORAL_TABLET | Freq: Two times a day (BID) | ORAL | Status: DC

## 2012-03-11 MED ORDER — GUAIFENESIN-CODEINE 100-10 MG/5ML PO SYRP: 5.0000 mL | ORAL_SOLUTION | Freq: Every evening | ORAL | Status: DC | PRN

## 2012-03-11 NOTE — Patient Instructions (Addendum)
Take the doxycycline as directed  Try the cough syrup at night- it will make you sleepy Drink lots of fluids Try saline nasal spray for congestion as needed chlorcedin hbp is fine if it help Update if not starting to improve in a week or if worsening

## 2012-03-11 NOTE — Progress Notes (Signed)
Subjective:    Patient ID: Sara [name], female    DOB: [birth year], 78 y.o.   MRN: [id number]  HPI Took a flu shot last Wednesday  Was at [facility]  Started feeling sick the day after  Coughing all night  Low grade fever at night- chilled and achey  Some runny and stuffy nose Some ear fullness  Some facial pain now and then   Phlegm - prod in ams only - dark yellow to brown  No wheezing   Taking chlorcedin hpb  Patient Active Problem List  Diagnosis  . VITAMIN D DEFICIENCY  . HYPERCHOLESTEROLEMIA  . HYPERTENSION  . GERD  . SPINAL STENOSIS  . OSTEOPENIA  . CONSTIPATION, CHRONIC  . Breast pain  . Hip pain, left  . Itching   Past Medical History  Diagnosis Date  . GERD (gastroesophageal reflux disease)   . Hypertension   . Spinal stenosis   . Osteopenia   . Frequent UTI   . 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 Osteoarthritis     hips   Past Surgical History  Procedure Date  . Abdominal hysterectomy     partial has one ovary  . Brain surgery 1985  . Rotator cuff surgery 2010   History  Substance Use Topics  . Smoking status: Never Smoker   . Smokeless tobacco: Not on file  . Alcohol Use: No   Family History  Problem Relation Age of Onset  . Cancer Mother     kidney tumor  . Hypertension Mother   . Diabetes Sister   . Cancer Brother     lung CA  . Diabetes Brother   . Diabetes Sister    Allergies  Allergen Reactions  . Amoxicillin     REACTION: reaction not known  . Aspirin     REACTION: GERD  . Cefpodoxime Proxetil     REACTION: reaction not known  . Clarithromycin     REACTION: reaction not known  . Furosemide     REACTION: doesn't help  . Gabapentin     REACTION: caused severe swellling   Current Outpatient Prescriptions on File Prior to Visit  Medication Sig Dispense Refill  . aspirin EC 81 MG tablet Take 81 mg by mouth daily.        . hydrochlorothiazide (HYDRODIURIL) 25 MG tablet  TAKE ONE TABLET BY MOUTH EVERY DAY  30 tablet  6  . polyethylene glycol (MIRALAX / GLYCOLAX) packet Take 17 g by mouth as needed.       . potassium chloride (K-DUR) 10 MEQ tablet TAKE ONE TABLET BY MOUTH EVERY DAY  30 tablet  6  . TAZTIA XT 120 MG 24 hr capsule TAKE ONE  BY MOUTH EVERY DAY  30 each  5     Review of Systems Review of Systems  Constitutional: Negative for appetite change,  and unexpected weight change.  Eyes: Negative for pain and visual disturbance.  ENT pos for congestion and sinus pressure and post nasal drip  Respiratory: Negative for sob or wheeze Cardiovascular: Negative for cp or palpitations    Gastrointestinal: Negative for nausea, diarrhea and constipation.  Genitourinary: Negative for urgency and frequency.  Skin: Negative for pallor or rash   Neurological: Negative for weakness, light-headedness, numbness and headaches.  Hematological: Negative for adenopathy. Does not bruise/bleed easily.  Psychiatric/Behavioral: Negative for dysphoric mood. The patient is not nervous/anxious.  Objective:   Physical Exam  Constitutional: She appears well-developed and well-nourished. No distress.  HENT:  Head: Normocephalic and atraumatic.  Mouth/Throat: Oropharynx is clear and moist. No oropharyngeal exudate.       Nares are injected and congested   Bilateral maxillary sinus tenderness  Eyes: Conjunctivae normal and EOM are normal. Pupils are equal, round, and reactive to light. Right eye exhibits no discharge. Left eye exhibits no discharge. No scleral icterus.  Neck: Normal range of motion. Neck supple. No thyromegaly present.  Cardiovascular: Normal rate and regular rhythm.   Pulmonary/Chest: Effort normal and breath sounds normal. No respiratory distress. She has no wheezes. She has no rales. She exhibits no tenderness.       Harsh bs at bases , no rhonchi   Lymphadenopathy:    She has no cervical adenopathy.  Neurological: She is alert. She has normal  reflexes.  Skin: Skin is warm and dry. No rash noted. No erythema.  Psychiatric: She has a normal mood and affect.          Assessment & Plan:

## 2012-03-11 NOTE — Assessment & Plan Note (Signed)
After a uri viral  Is all to several abx Cover with doxycycline Robitussin AC for night time cough Disc symptomatic care - see instructions on AVS Update if not starting to improve in a week or if worsening

## 2012-05-13 ENCOUNTER — Other Ambulatory Visit: Payer: Self-pay | Admitting: Family Medicine

## 2012-06-11 ENCOUNTER — Other Ambulatory Visit: Payer: Self-pay | Admitting: Family Medicine

## 2012-08-31 ENCOUNTER — Encounter: Payer: Self-pay | Admitting: Family Medicine

## 2012-09-04 ENCOUNTER — Encounter: Payer: Self-pay | Admitting: Family Medicine

## 2012-09-04 ENCOUNTER — Ambulatory Visit (INDEPENDENT_AMBULATORY_CARE_PROVIDER_SITE_OTHER): Payer: Medicare Other | Admitting: Family Medicine

## 2012-09-04 VITALS — BP 144/84 | HR 57 | Temp 98.5°F | Ht 62.0 in | Wt 155.0 lb

## 2012-09-04 DIAGNOSIS — L738 Other specified follicular disorders: Secondary | ICD-10-CM

## 2012-09-04 DIAGNOSIS — R350 Frequency of micturition: Secondary | ICD-10-CM

## 2012-09-04 DIAGNOSIS — L731 Pseudofolliculitis barbae: Secondary | ICD-10-CM | POA: Insufficient documentation

## 2012-09-04 LAB — POCT URINALYSIS DIPSTICK
Bilirubin, UA: NEGATIVE
Ketones, UA: NEGATIVE
Leukocytes, UA: NEGATIVE
Spec Grav, UA: 1.01

## 2012-09-04 NOTE — Progress Notes (Signed)
Subjective:    Patient ID: Sara [name], female    DOB: 1931-10-19, 79 y.o.   MRN: [id number]  HPI Here for an uncomfortable area in groin - it hurts and itches - ? Unsure if there is a rash  She has never had shingles in the past Has noticed for about a week   No fever  It burns a little when she urinates More urinary frequency -no blood in urine   No vagina discharge  Is generally tired  Has some stress - dealing with her husband since he had a stroke   Patient Active Problem List  Diagnosis  . VITAMIN D DEFICIENCY  . HYPERCHOLESTEROLEMIA  . HYPERTENSION  . GERD  . SPINAL STENOSIS  . OSTEOPENIA  . CONSTIPATION, CHRONIC  . Breast pain  . Hip pain, left   Past Medical History  Diagnosis Date  . GERD (gastroesophageal reflux disease)   . Hypertension   . Spinal stenosis   . Osteopenia   . Frequent UTI   . 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 Osteoarthritis     hips   Past Surgical History  Procedure Laterality Date  . Abdominal hysterectomy      partial has one ovary  . Brain surgery  1985  . Rotator cuff surgery  2010   History  Substance Use Topics  . Smoking status: Never Smoker   . Smokeless tobacco: Not on file  . Alcohol Use: No   Family History  Problem Relation Age of Onset  . Cancer Mother     kidney tumor  . Hypertension Mother   . Diabetes Sister   . Cancer Brother     lung CA  . Diabetes Brother   . Diabetes Sister    Allergies  Allergen Reactions  . Amoxicillin     REACTION: reaction not known  . Aspirin     REACTION: GERD  . Cefpodoxime Proxetil     REACTION: reaction not known  . Clarithromycin     REACTION: reaction not known  . Furosemide     REACTION: doesn't help  . Gabapentin     REACTION: caused severe swellling   Current Outpatient Prescriptions on File Prior to Visit  Medication Sig Dispense Refill  . aspirin EC 81 MG tablet Take 81 mg by mouth daily.         . Cholecalciferol (VITAMIN D-3) 1000 UNITS CAPS Take 1 tablet by mouth daily.      . hydrochlorothiazide (HYDRODIURIL) 25 MG tablet TAKE ONE TABLET BY MOUTH EVERY DAY  30 tablet  3  . KLOR-CON M10 10 MEQ tablet TAKE ONE TABLET BY MOUTH EVERY DAY  30 tablet  3  . nitrofurantoin (MACRODANTIN) 100 MG capsule Take 100 mg by mouth 2 (two) times a week.      . polyethylene glycol (MIRALAX / GLYCOLAX) packet Take 17 g by mouth as needed.       Marland Kitchen TAZTIA XT 120 MG 24 hr capsule TAKE ONE  BY MOUTH EVERY DAY  30 capsule  5  . [DISCONTINUED] potassium chloride (K-DUR) 10 MEQ tablet TAKE ONE TABLET BY MOUTH EVERY DAY  30 tablet  6   No current facility-administered medications on file prior to visit.       Review of Systems Review of Systems  Constitutional: Negative for fever, appetite change, fatigue and unexpected weight change.  Eyes: Negative for pain and visual disturbance.  Respiratory:  Negative for cough and shortness of breath.   Cardiovascular: Negative for cp or palpitations    Gastrointestinal: Negative for nausea, diarrhea and constipation.  Genitourinary: Negative for urgency and pos for frequency  Skin: Negative for pallor or rash  pos for itchy/ tender area  Neurological: Negative for weakness, light-headedness, numbness and headaches.  Hematological: Negative for adenopathy. Does not bruise/bleed easily.  Psychiatric/Behavioral: Negative for dysphoric mood. The patient is not nervous/anxious.         Objective:   Physical Exam  Constitutional: She appears well-developed and well-nourished. No distress.  HENT:  Head: Normocephalic and atraumatic.  Mouth/Throat: Oropharynx is clear and moist.  Eyes: Conjunctivae and EOM are normal. Pupils are equal, round, and reactive to light. Right eye exhibits no discharge. Left eye exhibits no discharge. No scleral icterus.  Neck: Normal range of motion. Neck supple. No JVD present. Carotid bruit is not present. No thyromegaly present.   Cardiovascular: Normal rate and regular rhythm.   Pulmonary/Chest: Effort normal and breath sounds normal. No respiratory distress. She has no wheezes.  Abdominal: Soft. Bowel sounds are normal. She exhibits no distension, no abdominal bruit and no mass. There is no tenderness. There is no rebound and no guarding.  No suprapubic tenderness or fullness    Genitourinary:  Small erythematous area at top of mons pubis with scant induration - no drainage or tenderness or ulceration Resembles ingrown hair Nl app vag mucosa and urethra  Musculoskeletal: She exhibits no edema.  No cva tenderness   Lymphadenopathy:    She has no cervical adenopathy.  Neurological: She is alert. She has normal reflexes. No cranial nerve deficit. She exhibits normal muscle tone. Coordination normal.  Skin: Skin is warm and dry. No rash noted. No pallor.  Psychiatric: She has a normal mood and affect.          Assessment & Plan:

## 2012-09-04 NOTE — Patient Instructions (Addendum)
I think you have had an ingrown hair in the pubic area - looks like it is improving  Use warm compresses - 10 minutes on and 10 minutes off  Let me know if worse or not further improved after the weekend   (especially if it gets red or starts to drain)_ Urine is clear today

## 2012-09-04 NOTE — Assessment & Plan Note (Signed)
Suspect this is what caused her pain and swelling at top of mons pubis, her exam yields small area of erythema / mild / no fluctuance Adv use of warm compresses and if worse or no imp - will px abx  Will watch closely

## 2012-09-04 NOTE — Assessment & Plan Note (Signed)
ua neg Suspect overactive bladder Will watch this and disc tx if worse

## 2012-09-07 ENCOUNTER — Other Ambulatory Visit: Payer: Self-pay | Admitting: Family Medicine

## 2012-09-08 ENCOUNTER — Telehealth: Payer: Self-pay

## 2012-09-08 MED ORDER — SULFAMETHOXAZOLE-TRIMETHOPRIM 800-160 MG PO TABS: 1.0000 | ORAL_TABLET | Freq: Two times a day (BID) | ORAL | Status: DC

## 2012-09-08 NOTE — Telephone Encounter (Signed)
Pt said symptoms are same as when seen on 09/04/12; still pain and swelling in pubic area and another spot on privates has come up. p t request antibiotic to [facility] [address]Please advise.

## 2012-09-08 NOTE — Telephone Encounter (Signed)
Pt.notified

## 2012-09-08 NOTE — Telephone Encounter (Signed)
For folliculitis / skin infection take septra as directed and update if not improved Will send to her [facility] Be careful outdoors as this med can make her sunburn easily

## 2012-10-05 ENCOUNTER — Other Ambulatory Visit: Payer: Self-pay | Admitting: Family Medicine

## 2012-12-04 ENCOUNTER — Encounter (HOSPITAL_COMMUNITY): Payer: Self-pay | Admitting: Emergency Medicine

## 2012-12-04 ENCOUNTER — Emergency Department (HOSPITAL_COMMUNITY)
Admission: EM | Admit: 2012-12-04 | Discharge: 2012-12-04 | Disposition: A | Discharge status: Home / Self Care | Payer: Medicare Other | Source: Home / Self Care | Attending: Family Medicine | Admitting: Family Medicine

## 2012-12-04 ENCOUNTER — Telehealth: Payer: Self-pay | Admitting: Family Medicine

## 2012-12-04 DIAGNOSIS — B372 Candidiasis of skin and nail: Secondary | ICD-10-CM

## 2012-12-04 LAB — GLUCOSE, CAPILLARY: Glucose-Capillary: 90 mg/dL (ref 70–99)

## 2012-12-04 MED ORDER — NYSTATIN-TRIAMCINOLONE 100000-0.1 UNIT/GM-% EX OINT: TOPICAL_OINTMENT | Freq: Two times a day (BID) | CUTANEOUS | Status: DC

## 2012-12-04 MED ORDER — FLUCONAZOLE 150 MG PO TABS: ORAL_TABLET | ORAL | Status: DC

## 2012-12-04 NOTE — ED Provider Notes (Signed)
History    CSN: [id number] Arrival date & time 12/04/12  1012  First MD Initiated Contact with Patient 12/04/12 1036     Chief Complaint  Patient presents with  . Urticaria   (Consider location/radiation/quality/duration/timing/severity/associated sxs/prior Treatment) HPI Comments: 79 -year-old female here complaining of itchiness in her vulvar and bilateral groin area for almost 3 weeks. She has seen her primary care provider about this symptom and she was found to have an ingrown hair in the pubic area was treated with Septra she completed treatment without relief of her symptoms. She was also started on Zyrtec which has not helped. She is constantly scratching and feels her whole body is itching although there no rashes in other body areas. On reviewing her record she had normal blood glucose August 2013. Also had a normal hepatic panel last year. Denies polyuria polydipsia or polyphagia.  Past Medical History  Diagnosis Date  . GERD (gastroesophageal reflux disease)   . Hypertension   . Spinal stenosis   . Osteopenia   . Frequent UTI   . 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 Osteoarthritis     hips   Past Surgical History  Procedure Laterality Date  . Abdominal hysterectomy      partial has one ovary  . Brain surgery  1985  . Rotator cuff surgery  2010   Family History  Problem Relation Age of Onset  . Cancer Mother     kidney tumor  . Hypertension Mother   . Diabetes Sister   . Cancer Brother     lung CA  . Diabetes Brother   . Diabetes Sister    History  Substance Use Topics  . Smoking status: Never Smoker   . Smokeless tobacco: Not on file  . Alcohol Use: No   OB History   Grav Para Term Preterm Abortions TAB SAB Ect Mult Living                 Review of Systems  Constitutional: Negative for fever, chills, activity change, appetite change and fatigue.  Eyes: Negative for itching.  Gastrointestinal: Negative  for nausea, vomiting and abdominal pain.  Genitourinary: Negative for dysuria, vaginal discharge, genital sores and vaginal pain.  Skin: Positive for rash.       Vulvar and bilateral groin rash as per HPI  Allergic/Immunologic: Negative for environmental allergies and food allergies.  Neurological: Negative for dizziness and headaches.    Allergies  Amoxicillin; Aspirin; Cefpodoxime proxetil; Clarithromycin; Furosemide; and Gabapentin  Home Medications   Current Outpatient Rx  Name  Route  Sig  Dispense  Refill  . aspirin EC 81 MG tablet   Oral   Take 81 mg by mouth daily.           . Cholecalciferol (VITAMIN D-3) 1000 UNITS CAPS   Oral   Take 1 tablet by mouth daily.         . hydrochlorothiazide (HYDRODIURIL) 25 MG tablet      TAKE ONE TABLET BY MOUTH EVERY DAY   30 tablet   3   . KLOR-CON M10 10 MEQ tablet      TAKE ONE TABLET BY MOUTH EVERY DAY   30 tablet   2   . nitrofurantoin (MACRODANTIN) 100 MG capsule   Oral   Take 100 mg by mouth 2 (two) times a week.         . TAZTIA XT 120 MG  24 hr capsule      TAKE ONE  BY MOUTH EVERY DAY   30 capsule   5   . fluconazole (DIFLUCAN) 150 MG tablet      1 tab po weekly for 4   4 tablet   0   . nystatin-triamcinolone ointment (MYCOLOG)   Topical   Apply topically 2 (two) times daily.   30 g   0   . polyethylene glycol (MIRALAX / GLYCOLAX) packet   Oral   Take 17 g by mouth as needed.           BP 142/82  Pulse 60  Temp(Src) 98.6 F (37 C) (Oral)  Resp 20  SpO2 97% Physical Exam  Nursing note and vitals reviewed. Constitutional: She is oriented to person, place, and time. She appears well-developed and well-nourished. No distress.  HENT:  Head: Normocephalic and atraumatic.  Cardiovascular: Normal heart sounds.   Pulmonary/Chest: Breath sounds normal.  Abdominal: Soft. There is no tenderness.  Lymphadenopathy:    She has no cervical adenopathy.  Neurological: She is alert and oriented to  person, place, and time.  Skin: She is not diaphoretic.  Skin in bilateral groin areas look erythematous, smooth, shiny, inflamed and with areas of skin brake and maceration. There is also vulvar erythema, swelling and mild thickening of the skin from scratching. Described areas are very pruriginous. No pustules or scabs.     ED Course  Procedures (including critical care time) Labs Reviewed - No data to display No results found. 1. Candidal intertrigo     MDM  Treated with nystatin/triamcinolone cream and oral Diflucan. Check point-of-care CBG= Supportive care and red flags that should prompt return to medical attention discussed with patient and provided in writing.  Sharin Grave, MD 12/05/12 [phone]

## 2012-12-04 NOTE — Telephone Encounter (Signed)
Pt called and stated that ever since she started taking Zyrtec, she has been itching.  She says it started in her "private parts" that she saw Dr. Milinda Antis for but now it is her entire body that is itching.  Pt is also experiencing diarrhea but is unsure if this is related.  No other symptoms present.  She stopped taking Zyrtec 2-3 days ago and is still experiencing symptoms.  Advised pt to take Benadryl 25mg  tablet and if she experiences worsening symptoms to call our office or seek UC (no appts available today).  Pt verbalized understanding and has previous appt scheduled with Dr. Milinda Antis on Monday 6/30.

## 2012-12-04 NOTE — ED Notes (Signed)
Pt c/o itching all over body that comes and goes ... It started in the groin area about 2 weeks ago... Saw her PCP and was told it was an ingrown hair... Reports she started to take Zyrtec about 4 weeks ago and it maybe the cause of her itchiness... Denies: fevers, urinary sxs, vag d/c... She is alert and oriented w/no signs of acute distress.

## 2012-12-05 NOTE — Telephone Encounter (Signed)
Will see her then 

## 2012-12-07 ENCOUNTER — Other Ambulatory Visit: Payer: Self-pay | Admitting: Family Medicine

## 2012-12-07 ENCOUNTER — Ambulatory Visit (INDEPENDENT_AMBULATORY_CARE_PROVIDER_SITE_OTHER): Payer: Medicare Other | Admitting: Family Medicine

## 2012-12-07 ENCOUNTER — Encounter: Payer: Self-pay | Admitting: Family Medicine

## 2012-12-07 VITALS — BP 130/88 | HR 56 | Temp 98.3°F | Ht 62.0 in | Wt 152.2 lb

## 2012-12-07 DIAGNOSIS — M7989 Other specified soft tissue disorders: Secondary | ICD-10-CM

## 2012-12-07 DIAGNOSIS — K130 Diseases of lips: Secondary | ICD-10-CM | POA: Insufficient documentation

## 2012-12-07 MED ORDER — DILTIAZEM HCL ER BEADS 120 MG PO CP24: ORAL_CAPSULE | ORAL | Status: DC

## 2012-12-07 NOTE — Telephone Encounter (Signed)
Received faxed refill request from pharmacy. Refill sent to pharmacy electronically. 

## 2012-12-07 NOTE — Patient Instructions (Signed)
Continue your treatment for the groin itching - let me know if it does not go away  We will do referral to surgery for evaluation of your arm - I would like their opinion - see Shirlee Limerick on the way out for this Take care of yourself

## 2012-12-07 NOTE — Assessment & Plan Note (Signed)
Pt c/o of antecubital area of R arm - swelling (appears fatty - like lipoma on exam) - and pt has discomfort  Otherwise nl exam Will ref to gen surg to eval and see what they think this is

## 2012-12-07 NOTE — Progress Notes (Signed)
Subjective:    Patient ID: Sara [name], female    DOB: 09/22/1931, 79 y.o.   MRN: [id number]  HPI Here with arm swelling - both arms but worse in the Right  Is primarily over the antecubital area (worse if she uses her arm more )  This is really bothering her No trauma or blood draw in that area No neck pain  No numbness  Went to UC for itching in groin area  This has gone on for a long time - intermittently Still all in vaginal area / on outside only Dx with candidal vaginitis and tx with diflucan and also cream- anti fungal and also triamcinolone  Will take a diflucan weekly for 4 weeks  Is improved quite a bit   Patient Active Problem List   Diagnosis Date Noted  . Frequent urination 09/04/2012  . Ingrown hair 09/04/2012  . Breast pain 02/08/2011  . Hip pain, left 02/08/2011  . CONSTIPATION, CHRONIC 07/03/2010  . VITAMIN D DEFICIENCY 09/22/2008  . OSTEOPENIA 06/24/2008  . HYPERCHOLESTEROLEMIA 12/08/2006  . HYPERTENSION 12/08/2006  . GERD 12/08/2006  . SPINAL STENOSIS 12/08/2006   Past Medical History  Diagnosis Date  . GERD (gastroesophageal reflux disease)   . Hypertension   . Spinal stenosis   . Osteopenia   . Frequent UTI   . 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 Osteoarthritis     hips   Past Surgical History  Procedure Laterality Date  . Abdominal hysterectomy      partial has one ovary  . Brain surgery  1985  . Rotator cuff surgery  2010   History  Substance Use Topics  . Smoking status: Never Smoker   . Smokeless tobacco: Not on file  . Alcohol Use: No   Family History  Problem Relation Age of Onset  . Cancer Mother     kidney tumor  . Hypertension Mother   . Diabetes Sister   . Cancer Brother     lung CA  . Diabetes Brother   . Diabetes Sister    Allergies  Allergen Reactions  . Amoxicillin     REACTION: reaction not known  . Aspirin     REACTION: GERD  . Cefpodoxime Proxetil      REACTION: reaction not known  . Clarithromycin     REACTION: reaction not known  . Furosemide     REACTION: doesn't help  . Gabapentin     REACTION: caused severe swellling   Current Outpatient Prescriptions on File Prior to Visit  Medication Sig Dispense Refill  . aspirin EC 81 MG tablet Take 81 mg by mouth daily.        . Cholecalciferol (VITAMIN D-3) 1000 UNITS CAPS Take 1 tablet by mouth daily.      . fluconazole (DIFLUCAN) 150 MG tablet 1 tab po weekly for 4  4 tablet  0  . hydrochlorothiazide (HYDRODIURIL) 25 MG tablet TAKE ONE TABLET BY MOUTH EVERY DAY  30 tablet  3  . KLOR-CON M10 10 MEQ tablet TAKE ONE TABLET BY MOUTH EVERY DAY  30 tablet  2  . nitrofurantoin (MACRODANTIN) 100 MG capsule Take 100 mg by mouth 2 (two) times a week.      . nystatin-triamcinolone ointment (MYCOLOG) Apply topically 2 (two) times daily.  30 g  0  . polyethylene glycol (MIRALAX / GLYCOLAX) packet Take 17 g by mouth as needed.       Marland Kitchen  TAZTIA XT 120 MG 24 hr capsule TAKE ONE  BY MOUTH EVERY DAY  30 capsule  5  . [DISCONTINUED] potassium chloride (K-DUR) 10 MEQ tablet TAKE ONE TABLET BY MOUTH EVERY DAY  30 tablet  6   No current facility-administered medications on file prior to visit.     Review of Systems Review of Systems  Constitutional: Negative for fever, appetite change, fatigue and unexpected weight change.  Eyes: Negative for pain and visual disturbance.  Respiratory: Negative for cough and shortness of breath.   Cardiovascular: Negative for cp or palpitations    Gastrointestinal: Negative for nausea, diarrhea and constipation.  Genitourinary: Negative for urgency and frequency.  Skin: Negative for pallor or rash  pos for itching in vulvar/groin area that is improved  MSK neg for joint pain or swelling  Neurological: Negative for weakness, light-headedness, numbness and headaches.  Hematological: Negative for adenopathy. Does not bruise/bleed easily.  Psychiatric/Behavioral: Negative  for dysphoric mood. The patient is not nervous/anxious.         Objective:   Physical Exam  Constitutional: She appears well-developed and well-nourished. No distress.  obese and well appearing   HENT:  Head: Normocephalic and atraumatic.  Eyes: Conjunctivae and EOM are normal. Pupils are equal, round, and reactive to light. Right eye exhibits no discharge. Left eye exhibits no discharge. No scleral icterus.  Neck: Normal range of motion. Neck supple. No JVD present. Carotid bruit is not present. No thyromegaly present.  Cardiovascular: Normal rate, regular rhythm, normal heart sounds and intact distal pulses.   Pulmonary/Chest: Effort normal and breath sounds normal.  Abdominal: Soft. Bowel sounds are normal. She exhibits no abdominal bruit. There is no tenderness.  Musculoskeletal: She exhibits edema and tenderness.  Areas of fullness just below antecubital fossa of both arms -worse on the R with tenderness No rash or skin change noted  Nl rom and no crepitus or fluctuance noted   Lymphadenopathy:    She has no cervical adenopathy.  Neurological: She is alert. She has normal reflexes. She exhibits normal muscle tone.  Skin: Skin is warm and dry. No rash noted. No erythema. No pallor.  Psychiatric: She has a normal mood and affect.          Assessment & Plan:

## 2012-12-07 NOTE — Assessment & Plan Note (Signed)
Recently seen in UC for this - and tx with diflucan once weekly for 4 weeks as well as a lotrisone cream- doing much better If no imp will ref to gyn or derm  Disc keeping area clean and dry and cool

## 2012-12-09 ENCOUNTER — Encounter (INDEPENDENT_AMBULATORY_CARE_PROVIDER_SITE_OTHER): Payer: Self-pay | Admitting: General Surgery

## 2012-12-09 ENCOUNTER — Ambulatory Visit (INDEPENDENT_AMBULATORY_CARE_PROVIDER_SITE_OTHER): Payer: Medicare Other | Admitting: General Surgery

## 2012-12-09 VITALS — BP 132/74 | HR 78 | Temp 97.5°F | Resp 14 | Ht 62.0 in | Wt 151.6 lb

## 2012-12-09 DIAGNOSIS — M25521 Pain in right elbow: Secondary | ICD-10-CM

## 2012-12-09 DIAGNOSIS — M25529 Pain in unspecified elbow: Secondary | ICD-10-CM

## 2012-12-09 NOTE — Progress Notes (Signed)
Subjective:     Patient ID: Sara [name], female   DOB: [birth year], 79 y.o.   MRN: [id number]  HPI The pt is referred by Dr. Milinda Antis for an eval of a R elbow pain/? Mass.  The pt states she has had pain for about 6 mo with increasing an decreasing size.  She complains mainly of pain.  There is no particular thing that causes the pain.  Review of Systems  Constitutional: Negative.   HENT: Negative.   Respiratory: Negative.   Cardiovascular: Negative.   Gastrointestinal: Negative.   Neurological: Negative.   All [name] systems reviewed and are negative.       Objective:   Physical Exam  Constitutional: She is oriented to person, place, and time. She appears well-developed and well-nourished.  HENT:  Head: Normocephalic and atraumatic.  Eyes: Conjunctivae and EOM are normal. Pupils are equal, round, and reactive to light.  Neck: Normal range of motion. Neck supple.  Cardiovascular: Normal rate, regular rhythm and normal heart sounds.   Pulmonary/Chest: Effort normal and breath sounds normal.  Abdominal: Soft. Bowel sounds are normal.  Musculoskeletal: Normal range of motion.       Right elbow: She exhibits normal range of motion, no swelling, no effusion and no deformity. Tenderness (at the ventral muscle compartment) found.  Neurological: She is alert and oriented to person, place, and time.       Assessment:     79 y/o F with possible bursitis or tendonitis.  No Lipoma seen.     Plan:     1. Pt to f/u with her Orthopedic physician. 2. F/u PRN

## 2013-01-03 ENCOUNTER — Other Ambulatory Visit: Payer: Self-pay | Admitting: Family Medicine

## 2013-01-04 ENCOUNTER — Other Ambulatory Visit: Payer: Self-pay | Admitting: *Deleted

## 2013-01-04 MED ORDER — POTASSIUM CHLORIDE CRYS ER 10 MEQ PO TBCR: EXTENDED_RELEASE_TABLET | ORAL | Status: DC

## 2013-01-04 NOTE — Telephone Encounter (Signed)
done

## 2013-01-04 NOTE — Telephone Encounter (Signed)
Please refill for 6 mo 

## 2013-01-04 NOTE — Telephone Encounter (Signed)
Electronic refill request, no recent f/u or CPE appt please advise

## 2013-03-05 ENCOUNTER — Ambulatory Visit (INDEPENDENT_AMBULATORY_CARE_PROVIDER_SITE_OTHER): Payer: Medicare Other

## 2013-03-05 DIAGNOSIS — Z23 Encounter for immunization: Secondary | ICD-10-CM

## 2013-03-09 ENCOUNTER — Encounter (INDEPENDENT_AMBULATORY_CARE_PROVIDER_SITE_OTHER): Payer: Self-pay

## 2013-04-14 ENCOUNTER — Other Ambulatory Visit: Payer: Self-pay | Admitting: *Deleted

## 2013-06-16 ENCOUNTER — Encounter: Payer: Self-pay | Admitting: Family Medicine

## 2013-06-16 ENCOUNTER — Ambulatory Visit (INDEPENDENT_AMBULATORY_CARE_PROVIDER_SITE_OTHER): Payer: Medicare HMO | Admitting: Family Medicine

## 2013-06-16 VITALS — BP 142/70 | HR 54 | Temp 98.1°F | Ht 62.0 in | Wt 154.5 lb

## 2013-06-16 DIAGNOSIS — Z23 Encounter for immunization: Secondary | ICD-10-CM

## 2013-06-16 DIAGNOSIS — E7800 Pure hypercholesterolemia, unspecified: Secondary | ICD-10-CM

## 2013-06-16 DIAGNOSIS — E559 Vitamin D deficiency, unspecified: Secondary | ICD-10-CM

## 2013-06-16 DIAGNOSIS — I10 Essential (primary) hypertension: Secondary | ICD-10-CM

## 2013-06-16 MED ORDER — DILTIAZEM HCL ER BEADS 120 MG PO CP24: ORAL_CAPSULE | ORAL | Status: DC

## 2013-06-16 MED ORDER — POTASSIUM CHLORIDE CRYS ER 10 MEQ PO TBCR: EXTENDED_RELEASE_TABLET | ORAL | Status: DC

## 2013-06-16 MED ORDER — HYDROCHLOROTHIAZIDE 25 MG PO TABS: 25.0000 mg | ORAL_TABLET | Freq: Every day | ORAL | Status: DC

## 2013-06-16 NOTE — Progress Notes (Signed)
Pre-visit discussion using our clinic review tool. No additional management support is needed unless otherwise documented below in the visit note.  

## 2013-06-16 NOTE — Patient Instructions (Signed)
If you are interested in a shingles/zoster vaccine - call your insurance to check on coverage,( you should not get it within 1 month of other vaccines) , then call us for a prescription  for it to take to a pharmacy that gives the shot , or make a nurse visit to get it here depending on your coverage Pneumonia vaccine today  You are due for a tetanus shot - get it at the health department - (less expensive)  Please ask up front- to send for your last colonoscopy (it may be in your paper chart)  Labs today

## 2013-06-16 NOTE — Progress Notes (Signed)
Subjective:    Patient ID: Sara [name], female    DOB: May 30, 1932, 80 y.o.   MRN: [id number]  HPI Here for f/u of chronic medical problems   Wt is up 3 lb with bmi of 28  bp is stable today - has been out of bp medicine for 4 days - feeling ok - just tired from schedule  No cp or palpitations or headaches or edema  No side effects to medicines  BP Readings from Last 3 Encounters:  06/16/13 142/70  12/09/12 132/74  12/07/12 130/88      A lot of aches and pains from arthritis   Ate poorly after the holidays - getting back on track Gained 2-3 lb  Cannot walk due to excessive cold   Hx of hyperlipidema Lab Results  Component Value Date   CHOL 196 03/08/2010   HDL 51.60 03/08/2010   LDLCALC 123* 03/08/2010   LDLDIRECT 143.4 06/30/2008   TRIG 106.0 03/08/2010   CHOLHDL 4 03/08/2010    Has mammogram set up  Interested in shingles vaccine if covered  Will get pneumovax today  Td is not covered by medicare   Patient Active Problem List   Diagnosis Date Noted  . Arm swelling 12/07/2012  . Intertrigo labialis 12/07/2012  . Frequent urination 09/04/2012  . Ingrown hair 09/04/2012  . Breast pain 02/08/2011  . Hip pain, left 02/08/2011  . CONSTIPATION, CHRONIC 07/03/2010  . VITAMIN D DEFICIENCY 09/22/2008  . OSTEOPENIA 06/24/2008  . HYPERCHOLESTEROLEMIA 12/08/2006  . HYPERTENSION 12/08/2006  . GERD 12/08/2006  . SPINAL STENOSIS 12/08/2006   Past Medical History  Diagnosis Date  . GERD (gastroesophageal reflux disease)   . Hypertension   . Spinal stenosis   . Osteopenia   . Frequent UTI   . Clinical trial participant     [facility] studies in familial dementia  . Osteoarthritis     hips   Past Surgical History  Procedure Laterality Date  . Abdominal hysterectomy      partial has one ovary  . Brain surgery  1985  . Rotator cuff surgery  2010   History  Substance Use Topics  . Smoking status: Never Smoker   . Smokeless tobacco: Not on file    . Alcohol Use: No   Family History  Problem Relation Age of Onset  . Cancer Mother     kidney tumor  . Hypertension Mother   . Diabetes Sister   . Cancer Brother     lung CA  . Diabetes Brother   . Diabetes Sister    Allergies  Allergen Reactions  . Amoxicillin     REACTION: reaction not known  . Aspirin     REACTION: GERD  . Cefpodoxime Proxetil     REACTION: reaction not known  . Clarithromycin     REACTION: reaction not known  . Furosemide     REACTION: doesn't help  . Gabapentin     REACTION: caused severe swellling   Current Outpatient Prescriptions on File Prior to Visit  Medication Sig Dispense Refill  . aspirin EC 81 MG tablet Take 81 mg by mouth daily.        . Cholecalciferol (VITAMIN D-3) 1000 UNITS CAPS Take 1 tablet by mouth daily.      . fluconazole (DIFLUCAN) 150 MG tablet 1 tab po weekly for 4  4 tablet  0  . nitrofurantoin (MACRODANTIN) 100 MG capsule Take 100 mg by mouth 2 (two) times a week.      Marland Kitchen  nystatin-triamcinolone ointment (MYCOLOG) Apply topically 2 (two) times daily.  30 g  0  . polyethylene glycol (MIRALAX / GLYCOLAX) packet Take 17 g by mouth as needed.       . [DISCONTINUED] potassium chloride (K-DUR) 10 MEQ tablet TAKE ONE TABLET BY MOUTH EVERY DAY  30 tablet  6   No current facility-administered medications on file prior to visit.    Review of Systems Review of Systems  Constitutional: Negative for fever, appetite change,  and unexpected weight change.pos for fatigue  Eyes: Negative for pain and visual disturbance.  Respiratory: Negative for cough and shortness of breath.   Cardiovascular: Negative for cp or palpitations    Gastrointestinal: Negative for nausea, diarrhea and constipation.  Genitourinary: Negative for urgency and frequency.  Skin: Negative for pallor or rash   MSK pos for OA pain  Neurological: Negative for weakness, light-headedness, numbness and headaches.  Hematological: Negative for adenopathy. Does not  bruise/bleed easily.  Psychiatric/Behavioral: Negative for dysphoric mood. The patient is not nervous/anxious.  pos for caregiver stress (husband)       Objective:   Physical Exam  Constitutional: She appears well-developed and well-nourished. No distress.  overwt and well app  HENT:  Head: Normocephalic and atraumatic.  Mouth/Throat: Oropharynx is clear and moist.  Eyes: Conjunctivae and EOM are normal. Pupils are equal, round, and reactive to light. No scleral icterus.  Neck: Normal range of motion. Neck supple. No JVD present. Carotid bruit is not present. No thyromegaly present.  Cardiovascular: Normal rate, regular rhythm, normal heart sounds and intact distal pulses.  Exam reveals no gallop.   Pulmonary/Chest: Effort normal and breath sounds normal. No respiratory distress. She has no wheezes. She has no rales.  Abdominal: Soft. Bowel sounds are normal. She exhibits no distension, no abdominal bruit and no mass. There is no tenderness.  Musculoskeletal: She exhibits no edema.  Lymphadenopathy:    She has no cervical adenopathy.  Neurological: She is alert. She has normal reflexes. No cranial nerve deficit. She exhibits normal muscle tone. Coordination normal.  Skin: Skin is warm and dry. No rash noted.  Psychiatric: She has a normal mood and affect.          Assessment & Plan:

## 2013-06-17 LAB — COMPREHENSIVE METABOLIC PANEL
ALBUMIN: 4.3 g/dL (ref 3.5–5.2)
ALT: 17 U/L (ref 0–35)
AST: 20 U/L (ref 0–37)
Alkaline Phosphatase: 65 U/L (ref 39–117)
BUN: 16 mg/dL (ref 6–23)
CALCIUM: 9.6 mg/dL (ref 8.4–10.5)
CHLORIDE: 104 meq/L (ref 96–112)
CO2: 30 meq/L (ref 19–32)
Creatinine, Ser: 0.9 mg/dL (ref 0.4–1.2)
GFR: 78.66 mL/min (ref >60.00)
GLUCOSE: 91 mg/dL (ref 70–99)
POTASSIUM: 4 meq/L (ref 3.5–5.1)
SODIUM: 140 meq/L (ref 135–145)
TOTAL PROTEIN: 7.1 g/dL (ref 6.0–8.3)
Total Bilirubin: 0.7 mg/dL (ref 0.3–1.2)

## 2013-06-17 LAB — LIPID PANEL
CHOLESTEROL: 224 mg/dL — AB (ref 0–200)
HDL: 55.7 mg/dL (ref >39.00)
Total CHOL/HDL Ratio: 4
Triglycerides: 92 mg/dL (ref 0.0–149.0)
VLDL: 18.4 mg/dL (ref 0.0–40.0)

## 2013-06-17 LAB — CBC WITH DIFFERENTIAL/PLATELET
BASOS PCT: 0.6 % (ref 0.0–3.0)
Basophils Absolute: 0 10*3/uL (ref 0.0–0.1)
EOS PCT: 0.8 % (ref 0.0–5.0)
Eosinophils Absolute: 0 10*3/uL (ref 0.0–0.7)
HCT: 39.7 % (ref 36.0–46.0)
Hemoglobin: 13.1 g/dL (ref 12.0–15.0)
Lymphocytes Relative: 35.9 % (ref 12.0–46.0)
Lymphs Abs: 2.1 10*3/uL (ref 0.7–4.0)
MCHC: 33 g/dL (ref 30.0–36.0)
MCV: 85 fl (ref 78.0–100.0)
MONO ABS: 0.4 10*3/uL (ref 0.1–1.0)
MONOS PCT: 7.7 % (ref 3.0–12.0)
NEUTROS PCT: 55 % (ref 43.0–77.0)
Neutro Abs: 3.1 10*3/uL (ref 1.4–7.7)
PLATELETS: 203 10*3/uL (ref 150.0–400.0)
RBC: 4.67 Mil/uL (ref 3.87–5.11)
RDW: 12.6 % (ref 11.5–14.6)
WBC: 5.7 10*3/uL (ref 4.5–10.5)

## 2013-06-17 LAB — TSH: TSH: 1.13 u[IU]/mL (ref 0.35–5.50)

## 2013-06-17 LAB — LDL CHOLESTEROL, DIRECT: LDL DIRECT: 143.1 mg/dL

## 2013-06-17 LAB — VITAMIN D 25 HYDROXY (VIT D DEFICIENCY, FRACTURES): Vit D, 25-Hydroxy: 62 ng/mL (ref 30–89)

## 2013-06-17 NOTE — Assessment & Plan Note (Signed)
Lab today Imp to bone and overall health

## 2013-06-17 NOTE — Assessment & Plan Note (Signed)
Disc goals for lipids and reasons to control them Rev labs with pt (last draw)  Rev low sat fat diet in detail Lab today  

## 2013-06-17 NOTE — Assessment & Plan Note (Signed)
bp up off med for 4 d Refills given Rev lifestyle changes/ DASH diet  Lab today

## 2013-06-18 ENCOUNTER — Encounter: Payer: Self-pay | Admitting: *Deleted

## 2013-06-24 ENCOUNTER — Telehealth: Payer: Self-pay | Admitting: Family Medicine

## 2013-06-24 NOTE — Telephone Encounter (Signed)
Set reminder in inbasket to call pt late Feb and see if she is ready to schedule colonoscopy (that's around the time pt told me when I spoke with her)  Didn't want to call pt back because she was really upset last time we spoke

## 2013-06-24 NOTE — Telephone Encounter (Signed)
I'm sorry to hear that - just ask her to call our office when she is ready to get it scheduled-thanks

## 2013-06-24 NOTE — Telephone Encounter (Signed)
We received pt's last colonoscopy and it was on 12/29/2002.  Dr. Glori Bickers sent me a message saying pt is due for her next colonoscopy (ever 10 years) and did pt want [place] to put the order in.  Pt said she just lost her sister and she does want to have her colonoscopy but she wants to wait a month or 2 before she has it done, due to her grieving her sisters death  Pt's last colonoscopy placed back in your inbox

## 2013-07-12 ENCOUNTER — Telehealth: Payer: Self-pay | Admitting: Family Medicine

## 2013-07-12 NOTE — Telephone Encounter (Signed)
Relevant patient education mailed to patient.  

## 2013-07-23 ENCOUNTER — Encounter: Payer: Self-pay | Admitting: Gastroenterology

## 2013-08-18 ENCOUNTER — Ambulatory Visit (AMBULATORY_SURGERY_CENTER): Payer: Self-pay | Admitting: *Deleted

## 2013-08-18 VITALS — Ht 62.0 in | Wt 155.2 lb

## 2013-08-18 DIAGNOSIS — Z1211 Encounter for screening for malignant neoplasm of colon: Secondary | ICD-10-CM

## 2013-08-18 MED ORDER — PEG-KCL-NACL-NASULF-NA ASC-C 100 G PO SOLR: ORAL | Status: DC

## 2013-08-18 NOTE — Progress Notes (Signed)
No egg or soy products  

## 2013-08-19 ENCOUNTER — Telehealth: Payer: Self-pay | Admitting: Gastroenterology

## 2013-08-19 NOTE — Telephone Encounter (Signed)
Pt mailed voucher for free MoviPrep.  Pt aware

## 2013-09-01 ENCOUNTER — Ambulatory Visit (AMBULATORY_SURGERY_CENTER): Payer: Medicare HMO | Admitting: Gastroenterology

## 2013-09-01 ENCOUNTER — Encounter: Payer: Self-pay | Admitting: Gastroenterology

## 2013-09-01 VITALS — BP 137/75 | HR 60 | Temp 97.9°F | Resp 19 | Ht 62.0 in | Wt 155.0 lb

## 2013-09-01 DIAGNOSIS — R933 Abnormal findings on diagnostic imaging of other parts of digestive tract: Secondary | ICD-10-CM

## 2013-09-01 DIAGNOSIS — Z1211 Encounter for screening for malignant neoplasm of colon: Secondary | ICD-10-CM

## 2013-09-01 MED ORDER — SODIUM CHLORIDE 0.9 % IV SOLN: 500.0000 mL | INTRAVENOUS | Status: DC

## 2013-09-01 NOTE — Patient Instructions (Signed)
YOU HAD AN ENDOSCOPIC PROCEDURE TODAY AT [facility]: Refer to the procedure report that was given to you for any specific questions about what was found during the examination.  If the procedure report does not answer your questions, please call your gastroenterologist to clarify.  If you requested that your care partner not be given the details of your procedure findings, then the procedure report has been included in a sealed envelope for you to review at your convenience later.  YOU SHOULD EXPECT: Some feelings of bloating in the abdomen. Passage of more gas than usual.  Walking can help get rid of the air that was put into your GI tract during the procedure and reduce the bloating. If you had a lower endoscopy (such as a colonoscopy or flexible sigmoidoscopy) you may notice spotting of blood in your stool or on the toilet paper. If you underwent a bowel prep for your procedure, then you may not have a normal bowel movement for a few days.  DIET: Your first meal following the procedure should be a light meal and then it is ok to progress to your normal diet.  A half-sandwich or bowl of soup is an example of a good first meal.  Heavy or fried foods are harder to digest and may make you feel nauseous or bloated.  Likewise meals heavy in dairy and vegetables can cause extra gas to form and this can also increase the bloating.  Drink plenty of fluids but you should avoid alcoholic beverages for 24 hours.  ACTIVITY: Your care partner should take you home directly after the procedure.  You should plan to take it easy, moving slowly for the rest of the day.  You can resume normal activity the day after the procedure however you should NOT DRIVE or use heavy machinery for 24 hours (because of the sedation medicines used during the test).    SYMPTOMS TO REPORT IMMEDIATELY: A gastroenterologist can be reached at any hour.  During normal business hours, 8:30 AM to 5:00 PM Monday through Friday,  call [phone].  After hours and on weekends, please call the GI answering service at [phone] who will take a message and have the physician on call contact you.   Following lower endoscopy (colonoscopy or flexible sigmoidoscopy):  Excessive amounts of blood in the stool  Significant tenderness or worsening of abdominal pains  Swelling of the abdomen that is new, acute  Fever of 100F or higher  FOLLOW UP:  Our staff will call the home number listed on your records the next business day following your procedure to check on you and address any questions or concerns that you may have at that time regarding the information given to you following your procedure. This is a courtesy call and so if there is no answer at the home number and we have not heard from you through the emergency physician on call, we will assume that you have returned to your regular daily activities without incident.  SIGNATURES/CONFIDENTIALITY: You and/or your care partner have signed paperwork which will be entered into your electronic medical record.  These signatures attest to the fact that that the information above on your After Visit Summary has been reviewed and is understood.  Full responsibility of the confidentiality of this discharge information lies with you and/or your care-partner.  Continue your normal medications  Dr. Ardis Hughs' office will call you to set up an appointment to discuss other options for further testing

## 2013-09-01 NOTE — Op Note (Signed)
[facility]  [address] [place], [address]   COLONOSCOPY PROCEDURE REPORT  PATIENT: Sara, [name]  MR#: [id number] BIRTHDATE: May 19, 1932 , 81  yrs. old GENDER: Female ENDOSCOPIST: Milus Banister, MD REFERRED QQ:[id number] Vernell Morgans, M.D. PROCEDURE DATE:  09/01/2013 PROCEDURE:   Colonoscopy, screening Incomplete examination) First Screening Colonoscopy - Avg.  risk and is 50 yrs.  old or older - No.  Prior Negative Screening - Now for repeat screening. 10 or more years since last screening  History of Adenoma - Now for follow-up colonoscopy & has been > or = to 3 yrs.  N/A  Polyps Removed Today? No.  Recommend repeat exam, <10 yrs? No. ASA CLASS:   Class II INDICATIONS:   sent for colon cancer screening by Dr. Glori Bickers : colonoscopy Dr. Penelope Coop 2004 found no polyps MEDICATIONS: MAC sedation, administered by CRNA and propofol (Diprivan) 500mg  IV  DESCRIPTION OF PROCEDURE:   After the risks benefits and alternatives of the procedure were thoroughly explained, informed consent was obtained.  A digital rectal exam revealed no abnormalities of the rectum.   The [id number]  endoscope was introduced through the anus and advanced to the sigmoid colon. No adverse events experienced.   Limited by a tortuous and redundant colon.   The quality of the prep was excellent.  The instrument was then slowly withdrawn as the colon was fully examined.   COLON FINDINGS: THIS WAS AN INCOMPLETE EXAMINATION: There was a tight turn in proximal sigmoid colon which I could not advance past.  Adult and pediatric colonoscopes were attempted, there was increasing scope trauma at the site with each attempt and I was concerned about elevated risk for perforation and terminated the procedure.  Retroflexed views revealed no abnormalities. The time to cecum=minutes seconds.  Withdrawal time=minutes 0 seconds.  The scope was withdrawn and the procedure completed. COMPLICATIONS:  There were no complications.  ENDOSCOPIC IMPRESSION: THIS WAS AN INCOMPLETE EXAMINATION: There was a tight turn in proximal sigmoid colon which I could not advance past.  Adult and pediatric colonoscopes were attempted, there was increasing scope trauma at the site with each attempt and I was concerned about elevated risk for perforation and terminated the procedure.  RECOMMENDATIONS: My office will call you to schedule NGI appt to discuss other options for colon cancer screening (cologuard stool test, CT colonography) vs.  opting not to continue colon cancer screening (these type of screening tests usuall stop between age 29-80).   eSigned:  Milus Banister, MD 09/01/2013 10:54 AM

## 2013-09-02 ENCOUNTER — Telehealth: Payer: Self-pay | Admitting: *Deleted

## 2013-09-02 NOTE — Telephone Encounter (Signed)
  Follow up Call-  Call back number 09/01/2013  Post procedure Call Back phone  # [phone]  Permission to leave phone message Yes     Patient questions:  Do you have a fever, pain , or abdominal swelling? no Pain Score  0 *  Have you tolerated food without any problems? yes  Have you been able to return to your normal activities? yes  Do you have any questions about your discharge instructions: Diet   no Medications  no Follow up visit  no  Do you have questions or concerns about your Care? no  Actions: * If pain score is 4 or above: No action needed, pain <4.

## 2013-09-08 ENCOUNTER — Encounter: Payer: Self-pay | Admitting: Family Medicine

## 2013-09-09 ENCOUNTER — Encounter: Payer: Self-pay | Admitting: Family Medicine

## 2013-10-26 ENCOUNTER — Ambulatory Visit (INDEPENDENT_AMBULATORY_CARE_PROVIDER_SITE_OTHER): Payer: Commercial Managed Care - HMO | Admitting: Gastroenterology

## 2013-10-26 ENCOUNTER — Encounter: Payer: Self-pay | Admitting: Gastroenterology

## 2013-10-26 VITALS — BP 130/78 | HR 60 | Ht 62.0 in | Wt 153.1 lb

## 2013-10-26 DIAGNOSIS — R933 Abnormal findings on diagnostic imaging of other parts of digestive tract: Secondary | ICD-10-CM

## 2013-10-26 NOTE — Progress Notes (Signed)
Review of pertinent gastrointestinal problems: 1. Routine risk for colon cancer: 2004 colonoscopy Dr. Penelope Coop found no polyps;  Incomplete colonoscopy Ardis Hughs 09/2013 (sent for screening examination) "There was a tight turn in proximal sigmoid colon which I could not advance past. Adult and pediatric colonoscopes were attempted, there was increasing scope trauma at the site with each attempt and I was concerned about elevated risk for perforation and terminated the procedure"  HPI: This is a   very pleasant 80 year old woman for whom I did a colonoscopy about a month ago. It was incomplete. It was for routine screening. Her sigmoid was very tortuous but completely benign appearing. No overt bleeding, no weight loss.  Chronic constipation, much improved with miralax.   Review of systems: Pertinent positive and negative review of systems were noted in the above HPI section. Complete review of systems was performed and was otherwise normal.    Past Medical History  Diagnosis Date  . GERD (gastroesophageal reflux disease)   . Hypertension   . Spinal stenosis   . Osteopenia   . Frequent UTI   . Clinical trial participant     [facility] studies in familial dementia  . Allergy   . Osteoarthritis     hips, back  . Pneumonia   . Stroke 1985    Past Surgical History  Procedure Laterality Date  . Abdominal hysterectomy      partial has one ovary  . Rotator cuff surgery Right 2010  . Cataract extraction Bilateral   . Appendectomy    . Lumbar disc surgery      x 3    Current Outpatient Prescriptions  Medication Sig Dispense Refill  . aspirin EC 81 MG tablet Take 81 mg by mouth daily.        . Cholecalciferol (VITAMIN D-3) 1000 UNITS CAPS Take 1 tablet by mouth daily.      Marland Kitchen diltiazem (TIAZAC) 120 MG 24 hr capsule Take 120 mg by mouth daily.      Mariane Baumgarten Sodium (EQ STOOL SOFTENER PO) Take 100 mg by mouth daily.      Marland Kitchen doxylamine, Sleep, (EQ SLEEP AID) 25 MG tablet Take 25 mg  by mouth at bedtime as needed.      . hydrochlorothiazide (HYDRODIURIL) 25 MG tablet Take 1 tablet (25 mg total) by mouth daily.  30 tablet  11  . polyethylene glycol (MIRALAX / GLYCOLAX) packet Take 17 g by mouth as needed.       . potassium chloride (KLOR-CON M10) 10 MEQ tablet TAKE ONE TABLET BY MOUTH EVERY DAY  30 tablet  11  . tolterodine (DETROL) 2 MG tablet Take 2 mg by mouth 2 (two) times daily.      . [DISCONTINUED] potassium chloride (K-DUR) 10 MEQ tablet TAKE ONE TABLET BY MOUTH EVERY DAY  30 tablet  6   No current facility-administered medications for this visit.    Allergies as of 10/26/2013 - Review Complete 10/26/2013  Allergen Reaction Noted  . Amoxicillin  12/08/2006  . Aspirin  12/08/2006  . Cefpodoxime proxetil  12/08/2006  . Clarithromycin  12/08/2006  . Furosemide  12/08/2006  . Gabapentin      Family History  Problem Relation Age of Onset  . Other Mother     kidney tumor  . Hypertension Mother   . Diabetes Sister   . Lung cancer Brother   . Diabetes Brother   . Diabetes Sister     x 2  . Colon cancer  Maternal Uncle   . Esophageal cancer Neg Hx   . Rectal cancer Neg Hx   . Stomach cancer Neg Hx   . Other Sister     intestine burst  . Breast cancer Maternal Aunt     x 2  . Breast cancer Daughter   . Diabetes Son     x 3  . Diabetes Daughter     History   Social History  . Marital Status: Married    Spouse Name: N/A    Number of Children: 6  . Years of Education: N/A   Occupational History  . RETIRED    Social History Main Topics  . Smoking status: Never Smoker   . Smokeless tobacco: Never Used  . Alcohol Use: No  . Drug Use: No  . Sexual Activity: Not on file   Other Topics Concern  . Not on file   Social History Narrative  . No narrative on file       Physical Exam: BP 130/78  Pulse 60  Ht 5\' 2"  (1.575 m)  Wt 153 lb 2 oz (69.457 kg)  BMI 28.00 kg/m2 Constitutional: generally well-appearing Psychiatric: alert and  oriented x3 Eyes: extraocular movements intact Mouth: oral pharynx moist, no lesions Neck: supple no lymphadenopathy Cardiovascular: heart regular rate and rhythm Lungs: clear to auscultation bilaterally Abdomen: soft, nontender, nondistended, no obvious ascites, no peritoneal signs, normal bowel sounds Extremities: no lower extremity edema bilaterally Skin: no lesions on visible extremities    Assessment and plan: 80 y.o. female with  routine risk for colon cancer, very tortuous sigmoid colon which precluded complete colonoscopy  We discussed several options to complete her colon cancer screening versus suspend cancer screening given her age. She does understand colon cancer screening generally stops between age of 86 and 45. She is nervous about the possibility that she does have underlying significant pathology and so we agreed to proceed with Colo guard colon cancer screening stool tests. If this is positive then she'll need further testing including probably imaging or perhaps repeat colonoscopy. If it is negative then she would not likely require any further colon cancer screening.

## 2013-10-26 NOTE — Patient Instructions (Signed)
Cologuard colon cancer screening test. If this is negative, then you are probably done with colon cancer screening. If this is positive, then you will need further testing (CT colonography, repeat standard colonoscopy?).

## 2013-11-30 LAB — COLOGUARD: Cologuard: NEGATIVE

## 2014-01-03 ENCOUNTER — Telehealth: Payer: Self-pay | Admitting: Gastroenterology

## 2014-01-03 NOTE — Telephone Encounter (Signed)
The patient has been notified of this information and all questions answered.

## 2014-01-03 NOTE — Telephone Encounter (Signed)
Please call her.  cologuard screening test was negative.  She does not need further colon cancer screening testing.

## 2014-01-05 ENCOUNTER — Encounter: Payer: Self-pay | Admitting: Gastroenterology

## 2014-02-11 ENCOUNTER — Encounter: Payer: Self-pay | Admitting: Family Medicine

## 2014-02-11 ENCOUNTER — Ambulatory Visit (INDEPENDENT_AMBULATORY_CARE_PROVIDER_SITE_OTHER): Payer: Commercial Managed Care - HMO | Admitting: Family Medicine

## 2014-02-11 VITALS — BP 122/74 | HR 46 | Temp 98.1°F | Ht 62.0 in | Wt 152.0 lb

## 2014-02-11 DIAGNOSIS — R7303 Prediabetes: Secondary | ICD-10-CM | POA: Insufficient documentation

## 2014-02-11 DIAGNOSIS — I10 Essential (primary) hypertension: Secondary | ICD-10-CM

## 2014-02-11 DIAGNOSIS — R7309 Other abnormal glucose: Secondary | ICD-10-CM

## 2014-02-11 DIAGNOSIS — R739 Hyperglycemia, unspecified: Secondary | ICD-10-CM

## 2014-02-11 DIAGNOSIS — H8110 Benign paroxysmal vertigo, unspecified ear: Secondary | ICD-10-CM

## 2014-02-11 DIAGNOSIS — Z23 Encounter for immunization: Secondary | ICD-10-CM

## 2014-02-11 LAB — HEMOGLOBIN A1C: Hgb A1c MFr Bld: 6.3 % (ref 4.6–6.5)

## 2014-02-11 LAB — POCT CBG (FASTING - GLUCOSE)-MANUAL ENTRY: Glucose Fasting, POC: 87 mg/dL (ref 70–99)

## 2014-02-11 NOTE — Assessment & Plan Note (Signed)
Improved today - suspect positional vertigo either viral or allergy related Nl exam today  Can use dramamine with caution if symptoms worsen-aware of sedation with that Update if not starting to improve in a week or if worsening

## 2014-02-11 NOTE — Patient Instructions (Signed)
If you get more dizzy - you can buy meclizine (dramaine) over the counter and take as directed  It will make you sleepy  If symptoms worsen -or do not improve-let me know  Lab today to check for diabetes

## 2014-02-11 NOTE — Assessment & Plan Note (Signed)
One high reading at home 87 today Check A1C ? If her meter is accurate

## 2014-02-11 NOTE — Assessment & Plan Note (Signed)
bp in fair control at this time  BP Readings from Last 1 Encounters:  02/11/14 122/74   No changes needed Disc lifstyle change with low sodium diet and exercise  No change in tx

## 2014-02-11 NOTE — Progress Notes (Signed)
Pre visit review using our clinic review tool, if applicable. No additional management support is needed unless otherwise documented below in the visit note. 

## 2014-02-11 NOTE — Progress Notes (Signed)
Subjective:    Patient ID: Sara [name], female    DOB: December 05, 1931, 80 y.o.   MRN: [id number]  HPI Here for dizziness and she has noted some elevated blood sugars   Glucose fasting today is 87  Had a 200 reading at home - that was fasting also  ? What she ate the night before  ? If her meter is accurate   Dizziness - positional / happens when she looks down Room spins  No falls  No headache  occ nauseated with dizziness  Lasted about 2-3 days   Better today   Some allergy symptoms  Ears feels ok - but does not hear well as a rule  bp is stable today  No cp or palpitations or headaches or edema  No side effects to medicines  BP Readings from Last 3 Encounters:  02/11/14 122/74  10/26/13 130/78  09/01/13 137/75     Patient Active Problem List   Diagnosis Date Noted  . Arm swelling 12/07/2012  . Intertrigo labialis 12/07/2012  . Frequent urination 09/04/2012  . Ingrown hair 09/04/2012  . Breast pain 02/08/2011  . Hip pain, left 02/08/2011  . CONSTIPATION, CHRONIC 07/03/2010  . VITAMIN D DEFICIENCY 09/22/2008  . OSTEOPENIA 06/24/2008  . HYPERCHOLESTEROLEMIA 12/08/2006  . HYPERTENSION 12/08/2006  . GERD 12/08/2006  . SPINAL STENOSIS 12/08/2006   Past Medical History  Diagnosis Date  . GERD (gastroesophageal reflux disease)   . Hypertension   . Spinal stenosis   . Osteopenia   . Frequent UTI   . Clinical trial participant     [facility] studies in familial dementia  . Allergy   . Osteoarthritis     hips, back  . Pneumonia   . Stroke 1985   Past Surgical History  Procedure Laterality Date  . Abdominal hysterectomy      partial has one ovary  . Rotator cuff surgery Right 2010  . Cataract extraction Bilateral   . Appendectomy    . Lumbar disc surgery      x 3   History  Substance Use Topics  . Smoking status: Never Smoker   . Smokeless tobacco: Never Used  . Alcohol Use: No   Family History  Problem Relation Age of Onset  .  Other Mother     kidney tumor  . Hypertension Mother   . Diabetes Sister   . Lung cancer Brother   . Diabetes Brother   . Diabetes Sister     x 2  . Colon cancer Maternal Uncle   . Esophageal cancer Neg Hx   . Rectal cancer Neg Hx   . Stomach cancer Neg Hx   . Other Sister     intestine burst  . Breast cancer Maternal Aunt     x 2  . Breast cancer Daughter   . Diabetes Son     x 3  . Diabetes Daughter    Allergies  Allergen Reactions  . Amoxicillin     REACTION: reaction not known  . Aspirin     REACTION: GERD  . Cefpodoxime Proxetil     REACTION: reaction not known  . Clarithromycin     REACTION: reaction not known  . Furosemide     REACTION: doesn't help  . Gabapentin     REACTION: caused severe swellling   Current Outpatient Prescriptions on File Prior to Visit  Medication Sig Dispense Refill  . aspirin EC 81 MG tablet Take 81 mg by mouth  daily.        . Cholecalciferol (VITAMIN D-3) 1000 UNITS CAPS Take 1 tablet by mouth daily.      Marland Kitchen diltiazem (TIAZAC) 120 MG 24 hr capsule Take 120 mg by mouth daily.      Mariane Baumgarten Sodium (EQ STOOL SOFTENER PO) Take 100 mg by mouth daily.      Marland Kitchen doxylamine, Sleep, (EQ SLEEP AID) 25 MG tablet Take 25 mg by mouth at bedtime as needed.      . hydrochlorothiazide (HYDRODIURIL) 25 MG tablet Take 1 tablet (25 mg total) by mouth daily.  30 tablet  11  . polyethylene glycol (MIRALAX / GLYCOLAX) packet Take 17 g by mouth as needed.       . potassium chloride (KLOR-CON M10) 10 MEQ tablet TAKE ONE TABLET BY MOUTH EVERY DAY  30 tablet  11  . tolterodine (DETROL) 2 MG tablet Take 2 mg by mouth 2 (two) times daily.      . [DISCONTINUED] potassium chloride (K-DUR) 10 MEQ tablet TAKE ONE TABLET BY MOUTH EVERY DAY  30 tablet  6   No current facility-administered medications on file prior to visit.       Review of Systems Review of Systems  Constitutional: Negative for fever, appetite change, fatigue and unexpected weight change.  Eyes:  Negative for pain and visual disturbance.  Respiratory: Negative for cough and shortness of breath.   Cardiovascular: Negative for cp or palpitations    Gastrointestinal: Negative for nausea, diarrhea and constipation.  Genitourinary: Negative for urgency and frequency.  Skin: Negative for pallor or rash   Neurological: Negative for weakness,  numbness and headaches. pos for intermittent dizziness that is improved today Hematological: Negative for adenopathy. Does not bruise/bleed easily.  Psychiatric/Behavioral: Negative for dysphoric mood. The patient is not nervous/anxious.         Objective:   Physical Exam  Constitutional: She appears well-developed and well-nourished. No distress.  HENT:  Head: Normocephalic and atraumatic.  Nose: Nose normal.  Eyes: Conjunctivae and EOM are normal. Pupils are equal, round, and reactive to light. Right eye exhibits no discharge. Left eye exhibits no discharge. No scleral icterus.  Neck: Normal range of motion. Neck supple. No JVD present. No thyromegaly present.  Cardiovascular: Normal rate, regular rhythm, normal heart sounds and intact distal pulses.  Exam reveals no gallop.   Pulmonary/Chest: Effort normal and breath sounds normal. No respiratory distress. She has no wheezes. She has no rales.  Abdominal: Soft. Bowel sounds are normal. She exhibits no distension and no mass. There is no tenderness.  Musculoskeletal: She exhibits no edema and no tenderness.  Lymphadenopathy:    She has no cervical adenopathy.  Neurological: She is alert. She has normal reflexes. She displays no atrophy. No cranial nerve deficit or sensory deficit. She exhibits normal muscle tone. Coordination and gait normal.  No focal cerebellar signs   Skin: Skin is warm and dry. No rash noted. No erythema. No pallor.  Psychiatric: She has a normal mood and affect.          Assessment & Plan:   Problem List Items Addressed This Visit     Cardiovascular and  Mediastinum   HYPERTENSION      bp in fair control at this time  BP Readings from Last 1 Encounters:  02/11/14 122/74   No changes needed Disc lifstyle change with low sodium diet and exercise  No change in tx       Nervous and Auditory  Vertigo, benign paroxysmal - Primary     Improved today - suspect positional vertigo either viral or allergy related Nl exam today  Can use dramamine with caution if symptoms worsen-aware of sedation with that Update if not starting to improve in a week or if worsening        Other   Hyperglycemia     One high reading at home 87 today Check A1C ? If her meter is accurate     Relevant Orders      Glucose (CBG), Fasting (Completed)      Hemoglobin A1c (Completed)    Other Visit Diagnoses   Need for prophylactic vaccination and inoculation against influenza        Relevant Orders       Flu Vaccine QUAD 36+ mos PF IM (Fluarix Quad PF) (Completed)

## 2014-02-15 ENCOUNTER — Encounter: Payer: Self-pay | Admitting: *Deleted

## 2014-04-15 ENCOUNTER — Ambulatory Visit (INDEPENDENT_AMBULATORY_CARE_PROVIDER_SITE_OTHER)
Admission: RE | Admit: 2014-04-15 | Discharge: 2014-04-15 | Disposition: A | Discharge status: Home / Self Care | Payer: Commercial Managed Care - HMO | Source: Ambulatory Visit | Attending: Family Medicine | Admitting: Family Medicine

## 2014-04-15 ENCOUNTER — Encounter: Payer: Self-pay | Admitting: Family Medicine

## 2014-04-15 ENCOUNTER — Ambulatory Visit (INDEPENDENT_AMBULATORY_CARE_PROVIDER_SITE_OTHER): Payer: Commercial Managed Care - HMO | Admitting: Family Medicine

## 2014-04-15 VITALS — BP 128/74 | HR 52 | Temp 98.4°F | Ht 62.0 in | Wt 152.8 lb

## 2014-04-15 DIAGNOSIS — R21 Rash and other nonspecific skin eruption: Secondary | ICD-10-CM

## 2014-04-15 DIAGNOSIS — M79672 Pain in left foot: Secondary | ICD-10-CM

## 2014-04-15 MED ORDER — CLOTRIMAZOLE-BETAMETHASONE 1-0.05 % EX CREA: 1.0000 "application " | TOPICAL_CREAM | Freq: Two times a day (BID) | CUTANEOUS | Status: DC

## 2014-04-15 NOTE — Assessment & Plan Note (Signed)
Tender under 2nd/3rd metatarsal heads  ?mild hammer toe deformity (2nd toe) No callus  Xray today   May benefit from orthotic in the future  Pend result

## 2014-04-15 NOTE — Assessment & Plan Note (Signed)
Area on the back of the neck is round with some central clearing / more linear in front  Trial of lotrisone cream bid and update  Adv to avoid scents/harsh detergents

## 2014-04-15 NOTE — Progress Notes (Signed)
Pre visit review using our clinic review tool, if applicable. No additional management support is needed unless otherwise documented below in the visit note. 

## 2014-04-15 NOTE — Progress Notes (Signed)
Subjective:    Patient ID: Sara [name], female    DOB: 07/11/1931, 80 y.o.   MRN: [id number]  HPI Here with a rash and foot pain   Some rash - patch at post hair line and itchy - for about 2 months (comes and goes) No redness or blisters  Feels some bumps  Tried some otc topical meds including cortisone -no help   Also L foot pain  Is in the ball of her foot  No trauma  When it hurts-does not matter if she is moving or sitting  Dull to hot in nature  She tried tylenol - eases it a bit (the tylenol arthritis formula)   She does not wear very supportive shoes   Patient Active Problem List   Diagnosis Date Noted  . Vertigo, benign paroxysmal 02/11/2014  . Hyperglycemia 02/11/2014  . Arm swelling 12/07/2012  . Intertrigo labialis 12/07/2012  . Frequent urination 09/04/2012  . Ingrown hair 09/04/2012  . Breast pain 02/08/2011  . Hip pain, left 02/08/2011  . CONSTIPATION, CHRONIC 07/03/2010  . VITAMIN D DEFICIENCY 09/22/2008  . OSTEOPENIA 06/24/2008  . HYPERCHOLESTEROLEMIA 12/08/2006  . HYPERTENSION 12/08/2006  . GERD 12/08/2006  . SPINAL STENOSIS 12/08/2006   Past Medical History  Diagnosis Date  . GERD (gastroesophageal reflux disease)   . Hypertension   . Spinal stenosis   . Osteopenia   . Frequent UTI   . Clinical trial participant     [facility] studies in familial dementia  . Allergy   . Osteoarthritis     hips, back  . Pneumonia   . Stroke 1985   Past Surgical History  Procedure Laterality Date  . Abdominal hysterectomy      partial has one ovary  . Rotator cuff surgery Right 2010  . Cataract extraction Bilateral   . Appendectomy    . Lumbar disc surgery      x 3   History  Substance Use Topics  . Smoking status: Never Smoker   . Smokeless tobacco: Never Used  . Alcohol Use: No   Family History  Problem Relation Age of Onset  . Other Mother     kidney tumor  . Hypertension Mother   . Diabetes Sister   . Lung cancer  Brother   . Diabetes Brother   . Diabetes Sister     x 2  . Colon cancer Maternal Uncle   . Esophageal cancer Neg Hx   . Rectal cancer Neg Hx   . Stomach cancer Neg Hx   . Other Sister     intestine burst  . Breast cancer Maternal Aunt     x 2  . Breast cancer Daughter   . Diabetes Son     x 3  . Diabetes Daughter    Allergies  Allergen Reactions  . Amoxicillin     REACTION: reaction not known  . Aspirin     REACTION: GERD  . Cefpodoxime Proxetil     REACTION: reaction not known  . Clarithromycin     REACTION: reaction not known  . Furosemide     REACTION: doesn't help  . Gabapentin     REACTION: caused severe swellling   Current Outpatient Prescriptions on File Prior to Visit  Medication Sig Dispense Refill  . aspirin EC 81 MG tablet Take 81 mg by mouth daily.      . Cholecalciferol (VITAMIN D-3) 1000 UNITS CAPS Take 1 tablet by mouth daily.    Marland Kitchen  diltiazem (TIAZAC) 120 MG 24 hr capsule Take 120 mg by mouth daily.    Mariane Baumgarten Sodium (EQ STOOL SOFTENER PO) Take 100 mg by mouth daily.    Marland Kitchen doxylamine, Sleep, (EQ SLEEP AID) 25 MG tablet Take 25 mg by mouth at bedtime as needed.    . hydrochlorothiazide (HYDRODIURIL) 25 MG tablet Take 1 tablet (25 mg total) by mouth daily. 30 tablet 11  . polyethylene glycol (MIRALAX / GLYCOLAX) packet Take 17 g by mouth as needed.     . potassium chloride (KLOR-CON M10) 10 MEQ tablet TAKE ONE TABLET BY MOUTH EVERY DAY 30 tablet 11  . tolterodine (DETROL) 2 MG tablet Take 2 mg by mouth 2 (two) times daily.    . [DISCONTINUED] potassium chloride (K-DUR) 10 MEQ tablet TAKE ONE TABLET BY MOUTH EVERY DAY 30 tablet 6   No current facility-administered medications on file prior to visit.      Review of Systems Review of Systems  Constitutional: Negative for fever, appetite change, fatigue and unexpected weight change.  Eyes: Negative for pain and visual disturbance.  Respiratory: Negative for cough and shortness of breath.     Cardiovascular: Negative for cp or palpitations    Gastrointestinal: Negative for nausea, diarrhea and constipation.  Genitourinary: Negative for urgency and frequency.  Skin: Negative for pallor and pos for rash Msk pos for foot pain  Neurological: Negative for weakness, light-headedness, numbness and headaches.  Hematological: Negative for adenopathy. Does not bruise/bleed easily.  Psychiatric/Behavioral: Negative for dysphoric mood. The patient is not nervous/anxious.         Objective:   Physical Exam  Constitutional: She appears well-developed and well-nourished. No distress.  HENT:  Head: Normocephalic and atraumatic.  Mouth/Throat: Oropharynx is clear and moist.  Eyes: Conjunctivae and EOM are normal. Pupils are equal, round, and reactive to light. Right eye exhibits no discharge. Left eye exhibits no discharge. No scleral icterus.  Neck: Normal range of motion. Neck supple. No JVD present.  Cardiovascular: Normal rate, regular rhythm and normal heart sounds.   Pulmonary/Chest: Effort normal and breath sounds normal.  Musculoskeletal: She exhibits tenderness. She exhibits no edema.  Tenderness over the distal 2nd and 3rd metatarsals L foot  slt hammertoe def in 2nd toe  No crepitus   Medial bunion worse on R foot   Lymphadenopathy:    She has no cervical adenopathy.  Neurological: She is alert. She has normal reflexes. She exhibits normal muscle tone. Coordination normal.  Skin: Skin is warm and dry. Rash noted. No erythema. No pallor.  1-2 cm area at base of post scalp is round with raised borders and scale  Several areas on ant neck that are excoriated and raised but smooth  Psychiatric: She has a normal mood and affect.          Assessment & Plan:   Problem List Items Addressed This Visit      Musculoskeletal and Integument   Rash and nonspecific skin eruption    Area on the back of the neck is round with some central clearing / more linear in front  Trial  of lotrisone cream bid and update  Adv to avoid scents/harsh detergents        Other   Left foot pain - Primary    Tender under 2nd/3rd metatarsal heads  ?mild hammer toe deformity (2nd toe) No callus  Xray today   May benefit from orthotic in the future  Pend result     Relevant Orders  DG Foot Complete Left (Completed)

## 2014-04-15 NOTE — Patient Instructions (Signed)
Xray of foot today  We will contact you with result and plan  Try to wear more supportive shoes (also wide) Try the new cream on rash areas twice daily  Update if not starting to improve in the next 10-14 days or if worsening

## 2014-04-28 ENCOUNTER — Encounter: Payer: Self-pay | Admitting: *Deleted

## 2014-06-13 ENCOUNTER — Other Ambulatory Visit: Payer: Self-pay | Admitting: Family Medicine

## 2014-07-08 ENCOUNTER — Other Ambulatory Visit: Payer: Self-pay | Admitting: Family Medicine

## 2014-07-08 NOTE — Telephone Encounter (Signed)
Ok to refill? Last prescribed 06/16/13. Last seen 04/15/14.

## 2014-07-08 NOTE — Telephone Encounter (Signed)
Please schedule f/u in the spring and refill until then  Thanks

## 2014-07-08 NOTE — Telephone Encounter (Signed)
Spoken to patient. Schedule follow up on 07/22/14 at 2pm. Sent in rx

## 2014-07-22 ENCOUNTER — Ambulatory Visit (INDEPENDENT_AMBULATORY_CARE_PROVIDER_SITE_OTHER): Payer: Commercial Managed Care - HMO | Admitting: Family Medicine

## 2014-07-22 ENCOUNTER — Encounter: Payer: Self-pay | Admitting: Family Medicine

## 2014-07-22 VITALS — BP 126/74 | HR 59 | Temp 97.8°F | Ht 62.0 in | Wt 154.1 lb

## 2014-07-22 DIAGNOSIS — R739 Hyperglycemia, unspecified: Secondary | ICD-10-CM | POA: Diagnosis not present

## 2014-07-22 DIAGNOSIS — Z636 Dependent relative needing care at home: Secondary | ICD-10-CM | POA: Diagnosis not present

## 2014-07-22 DIAGNOSIS — I10 Essential (primary) hypertension: Secondary | ICD-10-CM

## 2014-07-22 DIAGNOSIS — E7800 Pure hypercholesterolemia, unspecified: Secondary | ICD-10-CM

## 2014-07-22 DIAGNOSIS — E559 Vitamin D deficiency, unspecified: Secondary | ICD-10-CM

## 2014-07-22 LAB — CBC WITH DIFFERENTIAL/PLATELET
Basophils Absolute: 0 10*3/uL (ref 0.0–0.1)
Basophils Relative: 0.7 % (ref 0.0–3.0)
EOS PCT: 1.2 % (ref 0.0–5.0)
Eosinophils Absolute: 0.1 10*3/uL (ref 0.0–0.7)
HEMATOCRIT: 40.5 % (ref 36.0–46.0)
Hemoglobin: 13.4 g/dL (ref 12.0–15.0)
Lymphocytes Relative: 35 % (ref 12.0–46.0)
Lymphs Abs: 2.1 10*3/uL (ref 0.7–4.0)
MCHC: 33 g/dL (ref 30.0–36.0)
MCV: 84.3 fl (ref 78.0–100.0)
MONOS PCT: 10.3 % (ref 3.0–12.0)
Monocytes Absolute: 0.6 10*3/uL (ref 0.1–1.0)
Neutro Abs: 3.2 10*3/uL (ref 1.4–7.7)
Neutrophils Relative %: 52.8 % (ref 43.0–77.0)
PLATELETS: 206 10*3/uL (ref 150.0–400.0)
RBC: 4.8 Mil/uL (ref 3.87–5.11)
RDW: 13.9 % (ref 11.5–15.5)
WBC: 6 10*3/uL (ref 4.0–10.5)

## 2014-07-22 LAB — COMPREHENSIVE METABOLIC PANEL
ALBUMIN: 4.1 g/dL (ref 3.5–5.2)
ALT: 12 U/L (ref 0–35)
AST: 16 U/L (ref 0–37)
Alkaline Phosphatase: 78 U/L (ref 39–117)
BUN: 22 mg/dL (ref 6–23)
CHLORIDE: 104 meq/L (ref 96–112)
CO2: 32 mEq/L (ref 19–32)
Calcium: 9.9 mg/dL (ref 8.4–10.5)
Creatinine, Ser: 0.95 mg/dL (ref 0.40–1.20)
GFR: 72.75 mL/min (ref >60.00)
GLUCOSE: 102 mg/dL — AB (ref 70–99)
POTASSIUM: 3.8 meq/L (ref 3.5–5.1)
Sodium: 140 mEq/L (ref 135–145)
Total Bilirubin: 0.5 mg/dL (ref 0.2–1.2)
Total Protein: 7.2 g/dL (ref 6.0–8.3)

## 2014-07-22 LAB — LIPID PANEL
Cholesterol: 186 mg/dL (ref 0–200)
HDL: 55.2 mg/dL (ref >39.00)
LDL CALC: 101 mg/dL — AB (ref 0–99)
NonHDL: 130.8
TRIGLYCERIDES: 148 mg/dL (ref 0.0–149.0)
Total CHOL/HDL Ratio: 3
VLDL: 29.6 mg/dL (ref 0.0–40.0)

## 2014-07-22 LAB — TSH: TSH: 1.11 u[IU]/mL (ref 0.35–4.50)

## 2014-07-22 LAB — VITAMIN D 25 HYDROXY (VIT D DEFICIENCY, FRACTURES): VITD: 51.8 ng/mL (ref 30.00–100.00)

## 2014-07-22 LAB — HEMOGLOBIN A1C: HEMOGLOBIN A1C: 6.3 % (ref 4.6–6.5)

## 2014-07-22 MED ORDER — POTASSIUM CHLORIDE CRYS ER 10 MEQ PO TBCR: 10.0000 meq | EXTENDED_RELEASE_TABLET | Freq: Every day | ORAL | Status: DC

## 2014-07-22 NOTE — Progress Notes (Signed)
Subjective:    Patient ID: Sara [name], female    DOB: [birth year], 80 y.o.   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 -this is difficult  She has to help him with transfers -has to pull on him a lot - bothers her shoulder  Also his dementia keeps her nervous - he has to be watched all the time   PT came out today- to do initial assessment   Is very tired - caregiver stress Does not sleep well at night and up quite a bit with him   bp is stable today  No cp or palpitations or headaches or edema  No side effects to medicines  BP Readings from Last 3 Encounters:  07/22/14 126/74  04/15/14 128/74  02/11/14 122/74     Wt is up 2 lb with bmi of 28   Hyperglycemia  Lab Results  Component Value Date   HGBA1C 6.3 02/11/2014   She checks her sugar every once in a while - usually low 100s Watches her diet "sometimes" - worse over the weekends will eat some sweets  Likes to make cakes   High cholesterol - due for a check  Lab Results  Component Value Date   CHOL 224* 06/16/2013   HDL 55.70 06/16/2013   LDLCALC 123* 03/08/2010   LDLDIRECT 143.1 06/16/2013   TRIG 92.0 06/16/2013   CHOLHDL 4 06/16/2013   she loves real butter  Eats more fried food than she should   Patient Active Problem List   Diagnosis Date Noted  . Left foot pain 04/15/2014  . Rash and nonspecific skin eruption 04/15/2014  . Vertigo, benign paroxysmal 02/11/2014  . Hyperglycemia 02/11/2014  . Arm swelling 12/07/2012  . Intertrigo labialis 12/07/2012  . Frequent urination 09/04/2012  . Ingrown hair 09/04/2012  . Breast pain 02/08/2011  . Hip pain, left 02/08/2011  . CONSTIPATION, CHRONIC 07/03/2010  . VITAMIN D DEFICIENCY 09/22/2008  . OSTEOPENIA 06/24/2008  . HYPERCHOLESTEROLEMIA 12/08/2006  . Essential hypertension 12/08/2006  . GERD 12/08/2006  . SPINAL STENOSIS 12/08/2006   Past Medical History  Diagnosis Date  . GERD  (gastroesophageal reflux disease)   . Hypertension   . Spinal stenosis   . Osteopenia   . Frequent UTI   . Clinical trial participant     [facility] studies in familial dementia  . Allergy   . Osteoarthritis     hips, back  . Pneumonia   . Stroke 1985   Past Surgical History  Procedure Laterality Date  . Abdominal hysterectomy      partial has one ovary  . Rotator cuff surgery Right 2010  . Cataract extraction Bilateral   . Appendectomy    . Lumbar disc surgery      x 3   History  Substance Use Topics  . Smoking status: Never Smoker   . Smokeless tobacco: Never Used  . Alcohol Use: No   Family History  Problem Relation Age of Onset  . Other Mother     kidney tumor  . Hypertension Mother   . Diabetes Sister   . Lung cancer Brother   . Diabetes Brother   . Diabetes Sister     x 2  . Colon cancer Maternal Uncle   . Esophageal cancer Neg Hx   . Rectal cancer Neg Hx   . Stomach cancer Neg Hx   . Other Sister  intestine burst  . Breast cancer Maternal Aunt     x 2  . Breast cancer Daughter   . Diabetes Son     x 3  . Diabetes Daughter    Allergies  Allergen Reactions  . Amoxicillin     REACTION: reaction not known  . Aspirin     REACTION: GERD  . Cefpodoxime Proxetil     REACTION: reaction not known  . Clarithromycin     REACTION: reaction not known  . Furosemide     REACTION: doesn't help  . Gabapentin     REACTION: caused severe swellling   Current Outpatient Prescriptions on File Prior to Visit  Medication Sig Dispense Refill  . aspirin EC 81 MG tablet Take 81 mg by mouth daily.      . Cholecalciferol (VITAMIN D-3) 1000 UNITS CAPS Take 1 tablet by mouth daily.    . clotrimazole-betamethasone (LOTRISONE) cream Apply 1 application topically 2 (two) times daily. To affected areas 30 g 1  . Docusate Sodium (EQ STOOL SOFTENER PO) Take 100 mg by mouth daily.    Marland Kitchen doxylamine, Sleep, (EQ SLEEP AID) 25 MG tablet Take 25 mg by mouth at bedtime as  needed.    . hydrochlorothiazide (HYDRODIURIL) 25 MG tablet TAKE ONE TABLET BY MOUTH ONCE DAILY 30 tablet 4  . KLOR-CON M10 10 MEQ tablet TAKE ONE TABLET BY MOUTH ONCE DAILY 30 tablet 0  . polyethylene glycol (MIRALAX / GLYCOLAX) packet Take 17 g by mouth as needed.     Marland Kitchen TAZTIA XT 120 MG 24 hr capsule TAKE ONE CAPSULE BY MOUTH ONCE DAILY 30 capsule 4  . tolterodine (DETROL) 2 MG tablet Take 2 mg by mouth 2 (two) times daily.    . [DISCONTINUED] potassium chloride (K-DUR) 10 MEQ tablet TAKE ONE TABLET BY MOUTH EVERY DAY 30 tablet 6   No current facility-administered medications on file prior to visit.       Review of Systems Review of Systems  Constitutional: Negative for fever, appetite change,  and unexpected weight change.  Eyes: Negative for pain and visual disturbance.  Respiratory: Negative for cough and shortness of breath.   Cardiovascular: Negative for cp or palpitations    Gastrointestinal: Negative for nausea, diarrhea and constipation.  Genitourinary: Negative for urgency and frequency.  Skin: Negative for pallor or rash   Neurological: Negative for weakness, light-headedness, numbness and headaches.  Hematological: Negative for adenopathy. Does not bruise/bleed easily.  Psychiatric/Behavioral: Negative for dysphoric mood. The patient is not nervous/anxious.  pos for caregiver stress        Objective:   Physical Exam  Constitutional: She appears well-developed and well-nourished. No distress.  HENT:  Head: Normocephalic and atraumatic.  Mouth/Throat: Oropharynx is clear and moist.  Eyes: Conjunctivae and EOM are normal. Pupils are equal, round, and reactive to light. No scleral icterus.  Neck: Normal range of motion. Neck supple. No JVD present. Carotid bruit is not present. No thyromegaly present.  Cardiovascular: Normal rate and regular rhythm.   Pulmonary/Chest: Effort normal and breath sounds normal. No respiratory distress. She has no wheezes. She has no rales.    Abdominal: Soft. Bowel sounds are normal. She exhibits no distension and no abdominal bruit. There is no tenderness. There is no rebound.  Musculoskeletal: She exhibits no edema.  Lymphadenopathy:    She has no cervical adenopathy.  Neurological: She is alert. She has normal reflexes. No cranial nerve deficit. She exhibits normal muscle tone. Coordination normal.  Skin: Skin  is warm and dry. No rash noted. No erythema. No pallor.  Psychiatric: She has a normal mood and affect.          Assessment & Plan:   Problem List Items Addressed This Visit      Cardiovascular and Mediastinum   Essential hypertension - Primary    bp in fair control at this time  BP Readings from Last 1 Encounters:  07/22/14 126/74   No changes needed Disc lifstyle change with low sodium diet and exercise   Lab today      Relevant Orders   CBC with Differential/Platelet (Completed)   Comprehensive metabolic panel (Completed)   TSH (Completed)   Lipid panel (Completed)     Other   Caregiver stress    Long disc re: pt 's limitations re: caring for her chronically ill pt with dementia She does not have help-nor the $ for asst living right now  Aware she will not be able to do this long term Enc her to look at some care facilities  Disc coping skills       HYPERCHOLESTEROLEMIA    Lipid panel today Disc goals for lipids and reasons to control them Rev last lipids  Rev low sat fat diet in detail       Relevant Orders   Lipid panel (Completed)   Hyperglycemia    A1C today Disc imp of low glycemic diet and wt control       Relevant Orders   Hemoglobin A1c (Completed)   Vitamin D deficiency    D level today Disc imp to bone and overall health        Relevant Orders   Vit D  25 hydroxy (rtn osteoporosis monitoring) (Completed)

## 2014-07-22 NOTE — Progress Notes (Signed)
Pre visit review using our clinic review tool, if applicable. No additional management support is needed unless otherwise documented below in the visit note. 

## 2014-07-22 NOTE — Patient Instructions (Addendum)
Labs today  prevnar vaccine today If you are interested in a shingles/zoster vaccine - call your insurance to check on coverage,( you should not get it within 1 month of other vaccines) , then call us for a prescription  for it to take to a pharmacy that gives the shot , or make a nurse visit to get it here depending on your coverage   No change in medicine

## 2014-07-24 NOTE — Assessment & Plan Note (Signed)
D level today  Disc imp to bone and overall health   

## 2014-07-24 NOTE — Assessment & Plan Note (Signed)
bp in fair control at this time  BP Readings from Last 1 Encounters:  07/22/14 126/74   No changes needed Disc lifstyle change with low sodium diet and exercise   Lab today

## 2014-07-24 NOTE — Assessment & Plan Note (Signed)
Lipid panel today Disc goals for lipids and reasons to control them Rev last lipids  Rev low sat fat diet in detail

## 2014-07-24 NOTE — Assessment & Plan Note (Signed)
Long disc re: pt 's limitations re: caring for her chronically ill pt with dementia She does not have help-nor the $ for asst living right now  Aware she will not be able to do this long term Enc her to look at some care facilities  Disc coping skills

## 2014-07-24 NOTE — Assessment & Plan Note (Signed)
A1C today Disc imp of low glycemic diet and wt control

## 2014-07-25 ENCOUNTER — Encounter: Payer: Self-pay | Admitting: *Deleted

## 2014-09-22 DIAGNOSIS — Z803 Family history of malignant neoplasm of breast: Secondary | ICD-10-CM | POA: Diagnosis not present

## 2014-09-22 DIAGNOSIS — Z1231 Encounter for screening mammogram for malignant neoplasm of breast: Secondary | ICD-10-CM | POA: Diagnosis not present

## 2014-09-26 ENCOUNTER — Encounter: Payer: Self-pay | Admitting: Family Medicine

## 2014-09-27 ENCOUNTER — Encounter: Payer: Self-pay | Admitting: *Deleted

## 2014-10-04 DIAGNOSIS — R3915 Urgency of urination: Secondary | ICD-10-CM | POA: Diagnosis not present

## 2014-10-04 DIAGNOSIS — N3090 Cystitis, unspecified without hematuria: Secondary | ICD-10-CM | POA: Diagnosis not present

## 2014-10-04 DIAGNOSIS — N3945 Continuous leakage: Secondary | ICD-10-CM | POA: Diagnosis not present

## 2014-11-02 ENCOUNTER — Other Ambulatory Visit: Payer: Self-pay | Admitting: Family Medicine

## 2014-12-05 ENCOUNTER — Other Ambulatory Visit: Payer: Self-pay | Admitting: Family Medicine

## 2015-03-03 ENCOUNTER — Ambulatory Visit: Payer: Commercial Managed Care - HMO | Admitting: Family Medicine

## 2015-03-06 ENCOUNTER — Encounter: Payer: Self-pay | Admitting: Family Medicine

## 2015-03-06 ENCOUNTER — Ambulatory Visit (INDEPENDENT_AMBULATORY_CARE_PROVIDER_SITE_OTHER): Payer: Commercial Managed Care - HMO | Admitting: Family Medicine

## 2015-03-06 VITALS — BP 128/74 | HR 54 | Temp 98.1°F | Ht 62.0 in | Wt 152.2 lb

## 2015-03-06 DIAGNOSIS — Z23 Encounter for immunization: Secondary | ICD-10-CM

## 2015-03-06 DIAGNOSIS — R350 Frequency of micturition: Secondary | ICD-10-CM

## 2015-03-06 DIAGNOSIS — L309 Dermatitis, unspecified: Secondary | ICD-10-CM | POA: Diagnosis not present

## 2015-03-06 DIAGNOSIS — R8299 Other abnormal findings in urine: Secondary | ICD-10-CM | POA: Diagnosis not present

## 2015-03-06 DIAGNOSIS — R8290 Unspecified abnormal findings in urine: Secondary | ICD-10-CM | POA: Diagnosis not present

## 2015-03-06 LAB — POCT URINALYSIS DIPSTICK
BILIRUBIN UA: NEGATIVE
Glucose, UA: NEGATIVE
KETONES UA: NEGATIVE
NITRITE UA: NEGATIVE
PH UA: 6
PROTEIN UA: NEGATIVE
RBC UA: NEGATIVE
Spec Grav, UA: >=1.03
Urobilinogen, UA: 0.2

## 2015-03-06 MED ORDER — MOMETASONE FUROATE 0.1 % EX CREA: 1.0000 "application " | TOPICAL_CREAM | Freq: Every day | CUTANEOUS | Status: DC

## 2015-03-06 NOTE — Assessment & Plan Note (Signed)
Suspect due to concentrated urine Enc to drink more water and fluids in general  Urine sent for cx  Will update in the meantime if new symptoms

## 2015-03-06 NOTE — Assessment & Plan Note (Signed)
Behind ears but not in canals ? If rel to env allergies  tx with elocon cream daily prn Also mild cleanser Disc avoid scents and colors in products Update if not starting to improve in a week or if worsening

## 2015-03-06 NOTE — Progress Notes (Signed)
Pre visit review using our clinic review tool, if applicable. No additional management support is needed unless otherwise documented below in the visit note. 

## 2015-03-06 NOTE — Progress Notes (Signed)
Subjective:    Patient ID: Sara [name], female    DOB: January 14, 1932, 81 y.o.   MRN: [id number]  HPI Here with urinary symptoms and rash   Has odor in her urine - noticed 2 wk ago  Does eat broccoli - not asparagus or brussel sprouts  Poss not drinking enough water -has to make herself drink that  Some lemonade  No tea or coffee  No dysuria  Some frequency-but that's normal for her (esp at night)  No leaking  No blood in urine  No fever   Results for orders placed or performed in visit on 03/06/15  POCT urinalysis dipstick  Result Value Ref Range   Color, UA Light Yellow    Clarity, UA Clear    Glucose, UA Neg.    Bilirubin, UA Neg.    Ketones, UA Neg.    Spec Grav, UA >=1.030    Blood, UA Neg.    pH, UA 6.0    Protein, UA Neg.    Urobilinogen, UA 0.2    Nitrite, UA Neg.    Leukocytes, UA small (1+) (A) Negative    Lost her husband since last visit-hospice Has a lot of support    Rash behind ears -is itchy  Had something similar last year  ? If allergic to perfume  She uses ivory soap and some perfume  Uses neosporin otc   Patient Active Problem List   Diagnosis Date Noted  . Caregiver stress 07/22/2014  . Left foot pain 04/15/2014  . Rash and nonspecific skin eruption 04/15/2014  . Vertigo, benign paroxysmal 02/11/2014  . Hyperglycemia 02/11/2014  . Arm swelling 12/07/2012  . Intertrigo labialis 12/07/2012  . Frequent urination 09/04/2012  . Ingrown hair 09/04/2012  . Breast pain 02/08/2011  . Hip pain, left 02/08/2011  . CONSTIPATION, CHRONIC 07/03/2010  . Vitamin D deficiency 09/22/2008  . OSTEOPENIA 06/24/2008  . HYPERCHOLESTEROLEMIA 12/08/2006  . Essential hypertension 12/08/2006  . GERD 12/08/2006  . SPINAL STENOSIS 12/08/2006   Past Medical History  Diagnosis Date  . GERD (gastroesophageal reflux disease)   . Hypertension   . Spinal stenosis   . Osteopenia   . Frequent UTI   . Clinical trial participant     [facility]  studies in familial dementia  . Allergy   . Osteoarthritis     hips, back  . Pneumonia   . Stroke 1985   Past Surgical History  Procedure Laterality Date  . Abdominal hysterectomy      partial has one ovary  . Rotator cuff surgery Right 2010  . Cataract extraction Bilateral   . Appendectomy    . Lumbar disc surgery      x 3   Social History  Substance Use Topics  . Smoking status: Never Smoker   . Smokeless tobacco: Never Used  . Alcohol Use: No   Family History  Problem Relation Age of Onset  . Other Mother     kidney tumor  . Hypertension Mother   . Diabetes Sister   . Lung cancer Brother   . Diabetes Brother   . Diabetes Sister     x 2  . Colon cancer Maternal Uncle   . Esophageal cancer Neg Hx   . Rectal cancer Neg Hx   . Stomach cancer Neg Hx   . Other Sister     intestine burst  . Breast cancer Maternal Aunt     x 2  . Breast cancer Daughter   .  Diabetes Son     x 3  . Diabetes Daughter    Allergies  Allergen Reactions  . Amoxicillin     REACTION: reaction not known  . Aspirin     REACTION: GERD  . Cefpodoxime Proxetil     REACTION: reaction not known  . Clarithromycin     REACTION: reaction not known  . Furosemide     REACTION: doesn't help  . Gabapentin     REACTION: caused severe swellling   Current Outpatient Prescriptions on File Prior to Visit  Medication Sig Dispense Refill  . aspirin EC 81 MG tablet Take 81 mg by mouth daily.      . Cholecalciferol (VITAMIN D-3) 1000 UNITS CAPS Take 1 tablet by mouth daily.    . clotrimazole-betamethasone (LOTRISONE) cream Apply 1 application topically 2 (two) times daily. To affected areas 30 g 1  . Docusate Sodium (EQ STOOL SOFTENER PO) Take 100 mg by mouth daily.    Marland Kitchen doxylamine, Sleep, (EQ SLEEP AID) 25 MG tablet Take 25 mg by mouth at bedtime as needed.    . hydrochlorothiazide (HYDRODIURIL) 25 MG tablet TAKE ONE TABLET BY MOUTH ONCE DAILY 30 tablet 3  . polyethylene glycol (MIRALAX / GLYCOLAX)  packet Take 17 g by mouth as needed.     . potassium chloride (KLOR-CON M10) 10 MEQ tablet Take 1 tablet (10 mEq total) by mouth daily. 30 tablet 11  . TAZTIA XT 120 MG 24 hr capsule TAKE ONE CAPSULE BY MOUTH ONCE DAILY 30 capsule 5  . tolterodine (DETROL) 2 MG tablet Take 2 mg by mouth 2 (two) times daily.    . [DISCONTINUED] potassium chloride (K-DUR) 10 MEQ tablet TAKE ONE TABLET BY MOUTH EVERY DAY 30 tablet 6   No current facility-administered medications on file prior to visit.      Review of Systems Review of Systems  Constitutional: Negative for fever, appetite change, fatigue and unexpected weight change.  Eyes: Negative for pain and visual disturbance.  Respiratory: Negative for cough and shortness of breath.   Cardiovascular: Negative for cp or palpitations    Gastrointestinal: Negative for nausea, diarrhea and constipation.  Genitourinary: Negative for urgency and frequency. neg for dysuria or hematuria or flank pain  Skin: Negative for pallor and pos for rash behind ears that itches  Neurological: Negative for weakness, light-headedness, numbness and headaches.  Hematological: Negative for adenopathy. Does not bruise/bleed easily.  Psychiatric/Behavioral: Negative for dysphoric mood. The patient is not nervous/anxious.  pos for grief        Objective:   Physical Exam  Constitutional: She appears well-developed and well-nourished. No distress.  HENT:  Head: Normocephalic and atraumatic.  Eyes: Conjunctivae and EOM are normal. Pupils are equal, round, and reactive to light.  Neck: Normal range of motion. Neck supple.  Cardiovascular: Normal rate, regular rhythm and normal heart sounds.   Pulmonary/Chest: Effort normal and breath sounds normal.  Abdominal: Soft. Bowel sounds are normal. She exhibits no distension. There is no tenderness. There is no rebound.  No cva tenderness    Musculoskeletal: She exhibits no edema.  Lymphadenopathy:    She has no cervical  adenopathy.  Neurological: She is alert.  Skin: Skin is warm and dry. Rash noted.  Areas of scale and mildly thickened skin behind ears bilaterally  No signs of infection  No excoriations  Psychiatric: She has a normal mood and affect.  Talks candidly about loss of her husband  Assessment & Plan:   Problem List Items Addressed This Visit      Musculoskeletal and Integument   Eczema    Behind ears but not in canals ? If rel to env allergies  tx with elocon cream daily prn Also mild cleanser Disc avoid scents and colors in products Update if not starting to improve in a week or if worsening          Other   Abnormal urine odor - Primary    Suspect due to concentrated urine Enc to drink more water and fluids in general  Urine sent for cx  Will update in the meantime if new symptoms       Relevant Orders   Urine culture    Other Visit Diagnoses    Urinary frequency        Relevant Orders    POCT urinalysis dipstick (Completed)    Need for influenza vaccination        Relevant Orders    Flu Vaccine QUAD 36+ mos PF IM (Fluarix & Fluzone Quad PF) (Completed)

## 2015-03-06 NOTE — Patient Instructions (Signed)
I think the urine odor is from not drinking enough water  Please increase fluids - aim for 8-10 servings per day We will culture your urine and get back to you with a result Flu shot today Try the cream for your ears Avoid perfume   Update if not starting to improve in a week or if worsening

## 2015-03-09 LAB — URINE CULTURE

## 2015-03-10 ENCOUNTER — Telehealth: Payer: Self-pay | Admitting: Family Medicine

## 2015-03-10 MED ORDER — CIPROFLOXACIN HCL 250 MG PO TABS: 250.0000 mg | ORAL_TABLET | Freq: Two times a day (BID) | ORAL | Status: DC

## 2015-03-10 NOTE — Telephone Encounter (Signed)
I sent cipro to her [facility]-take for 5 days Update if no improvement

## 2015-03-10 NOTE — Telephone Encounter (Signed)
Pt notified Rx sent to pharmacy and to update [place] if no improvement

## 2015-03-27 DIAGNOSIS — H5210 Myopia, unspecified eye: Secondary | ICD-10-CM | POA: Diagnosis not present

## 2015-03-27 DIAGNOSIS — H5211 Myopia, right eye: Secondary | ICD-10-CM | POA: Diagnosis not present

## 2015-04-04 ENCOUNTER — Other Ambulatory Visit: Payer: Self-pay | Admitting: Family Medicine

## 2015-05-03 ENCOUNTER — Other Ambulatory Visit: Payer: Self-pay | Admitting: Family Medicine

## 2015-07-24 ENCOUNTER — Ambulatory Visit (INDEPENDENT_AMBULATORY_CARE_PROVIDER_SITE_OTHER): Payer: Commercial Managed Care - HMO | Admitting: Family Medicine

## 2015-07-24 ENCOUNTER — Encounter: Payer: Self-pay | Admitting: Family Medicine

## 2015-07-24 VITALS — BP 152/85 | HR 55 | Temp 98.4°F | Ht 62.0 in | Wt 158.5 lb

## 2015-07-24 DIAGNOSIS — I10 Essential (primary) hypertension: Secondary | ICD-10-CM

## 2015-07-24 DIAGNOSIS — R1084 Generalized abdominal pain: Secondary | ICD-10-CM | POA: Diagnosis not present

## 2015-07-24 DIAGNOSIS — M79645 Pain in left finger(s): Secondary | ICD-10-CM

## 2015-07-24 DIAGNOSIS — R109 Unspecified abdominal pain: Secondary | ICD-10-CM | POA: Insufficient documentation

## 2015-07-24 DIAGNOSIS — E7800 Pure hypercholesterolemia, unspecified: Secondary | ICD-10-CM

## 2015-07-24 DIAGNOSIS — R1030 Lower abdominal pain, unspecified: Secondary | ICD-10-CM | POA: Diagnosis not present

## 2015-07-24 LAB — CBC WITH DIFFERENTIAL/PLATELET
BASOS PCT: 0.8 % (ref 0.0–3.0)
Basophils Absolute: 0.1 10*3/uL (ref 0.0–0.1)
Eosinophils Absolute: 0.1 10*3/uL (ref 0.0–0.7)
Eosinophils Relative: 0.9 % (ref 0.0–5.0)
HEMATOCRIT: 41.8 % (ref 36.0–46.0)
Hemoglobin: 13.5 g/dL (ref 12.0–15.0)
LYMPHS PCT: 32 % (ref 12.0–46.0)
Lymphs Abs: 2 10*3/uL (ref 0.7–4.0)
MCHC: 32.3 g/dL (ref 30.0–36.0)
MCV: 86.9 fl (ref 78.0–100.0)
MONOS PCT: 9.3 % (ref 3.0–12.0)
Monocytes Absolute: 0.6 10*3/uL (ref 0.1–1.0)
NEUTROS ABS: 3.6 10*3/uL (ref 1.4–7.7)
Neutrophils Relative %: 57 % (ref 43.0–77.0)
PLATELETS: 221 10*3/uL (ref 150.0–400.0)
RBC: 4.81 Mil/uL (ref 3.87–5.11)
RDW: 13.7 % (ref 11.5–15.5)
WBC: 6.4 10*3/uL (ref 4.0–10.5)

## 2015-07-24 LAB — POC URINALSYSI DIPSTICK (AUTOMATED)
Bilirubin, UA: NEGATIVE
Blood, UA: NEGATIVE
GLUCOSE UA: NEGATIVE
Ketones, UA: NEGATIVE
LEUKOCYTES UA: NEGATIVE
NITRITE UA: NEGATIVE
Protein, UA: NEGATIVE
Spec Grav, UA: 1.02
Urobilinogen, UA: 0.2
pH, UA: 6

## 2015-07-24 LAB — LIPID PANEL
CHOLESTEROL: 194 mg/dL (ref 0–200)
HDL: 53.7 mg/dL (ref >39.00)
LDL CALC: 110 mg/dL — AB (ref 0–99)
NonHDL: 140.47
TRIGLYCERIDES: 153 mg/dL — AB (ref 0.0–149.0)
Total CHOL/HDL Ratio: 4
VLDL: 30.6 mg/dL (ref 0.0–40.0)

## 2015-07-24 LAB — COMPREHENSIVE METABOLIC PANEL
ALT: 12 U/L (ref 0–35)
AST: 16 U/L (ref 0–37)
Albumin: 4.3 g/dL (ref 3.5–5.2)
Alkaline Phosphatase: 61 U/L (ref 39–117)
BUN: 16 mg/dL (ref 6–23)
CALCIUM: 9.8 mg/dL (ref 8.4–10.5)
CHLORIDE: 105 meq/L (ref 96–112)
CO2: 30 meq/L (ref 19–32)
Creatinine, Ser: 0.92 mg/dL (ref 0.40–1.20)
GFR: 75.31 mL/min (ref >60.00)
Glucose, Bld: 90 mg/dL (ref 70–99)
Potassium: 3.9 mEq/L (ref 3.5–5.1)
Sodium: 140 mEq/L (ref 135–145)
Total Bilirubin: 0.7 mg/dL (ref 0.2–1.2)
Total Protein: 7.4 g/dL (ref 6.0–8.3)

## 2015-07-24 LAB — TSH: TSH: 1.28 u[IU]/mL (ref 0.35–4.50)

## 2015-07-24 NOTE — Patient Instructions (Signed)
Labs today  Stop at check out and we will schedule your CT of the abdomen Try warm compress on thumb if it bothers you - we can get an xray if it worsens  BP is high  Make sure to take your fluid pill the next time you come so we get a more accurate blood pressure  Follow up with me 2 wk after CT scan

## 2015-07-24 NOTE — Assessment & Plan Note (Signed)
Fleeting L MCP joint pain  Not constant  occ stiff  Suspect OA Pt has f/u -may do xray at that time after other issues are figured out  Nl exam

## 2015-07-24 NOTE — Progress Notes (Signed)
Subjective:    Patient ID: Renato Shin, female    DOB: 1931-08-19, 82 y.o.   MRN: [id number]  HPI Here with pain - back and also hand   Pain is in low abd/waist and rad to her back  Bothers her when she stands and gets tired - and also when she bends more  On and off over the past year  Hx of multi level deg disc dz in back -thinks that is where it comes from (surgery times 3) Had hyst -one ovary remains  No urinary symptoms No vaginal d/c  miralax helps her constipation a lot   Used to lift her husband a lot   Lost him in 2016 -it is hard  Thinks she needs to get out more and visit her friends  Goes to church and sings in the choir    Also base of her L thumb - shoots down thumb occasionally  Nothing provokes it   bp is up today BP Readings from Last 3 Encounters:  07/24/15 150/86  03/06/15 128/74  07/22/14 126/74       Results for orders placed or performed in visit on 07/24/15  POCT Urinalysis Dipstick (Automated)  Result Value Ref Range   Color, UA Yellow    Clarity, UA Clear    Glucose, UA Neg.    Bilirubin, UA Neg.    Ketones, UA Neg.    Spec Grav, UA 1.020    Blood, UA Neg.    pH, UA 6.0    Protein, UA Neg.    Urobilinogen, UA 0.2    Nitrite, UA Neg.    Leukocytes, UA Negative Negative    Patient Active Problem List   Diagnosis Date Noted  . Pain of left thumb 07/24/2015  . Abdominal pain 07/24/2015  . Abnormal urine odor 03/06/2015  . Eczema 03/06/2015  . Caregiver stress 07/22/2014  . Left foot pain 04/15/2014  . Rash and nonspecific skin eruption 04/15/2014  . Vertigo, benign paroxysmal 02/11/2014  . Hyperglycemia 02/11/2014  . Arm swelling 12/07/2012  . Intertrigo labialis 12/07/2012  . Frequent urination 09/04/2012  . Ingrown hair 09/04/2012  . Breast pain 02/08/2011  . Hip pain, left 02/08/2011  . CONSTIPATION, CHRONIC 07/03/2010  . Vitamin D deficiency 09/22/2008  . OSTEOPENIA 06/24/2008  . HYPERCHOLESTEROLEMIA  12/08/2006  . Essential hypertension 12/08/2006  . GERD 12/08/2006  . SPINAL STENOSIS 12/08/2006   Past Medical History  Diagnosis Date  . GERD (gastroesophageal reflux disease)   . Hypertension   . Spinal stenosis   . Osteopenia   . Frequent UTI   . 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 Allergy   . Osteoarthritis     hips, back  . Pneumonia   . Stroke [facility]) 1985   Past Surgical History  Procedure Laterality Date  . Abdominal hysterectomy      partial has one ovary  . Rotator cuff surgery Right 2010  . Cataract extraction Bilateral   . Appendectomy    . Lumbar disc surgery      x 3   Social History  Substance Use Topics  . Smoking status: Never Smoker   . Smokeless tobacco: Never Used  . Alcohol Use: No   Family History  Problem Relation Age of Onset  . Other Mother     kidney tumor  . Hypertension Mother   . Diabetes Sister   . Lung cancer Brother   .  Diabetes Brother   . Diabetes Sister     x 2  . Colon cancer Maternal Uncle   . Esophageal cancer Neg Hx   . Rectal cancer Neg Hx   . Stomach cancer Neg Hx   . Other Sister     intestine burst  . Breast cancer Maternal Aunt     x 2  . Breast cancer Daughter   . Diabetes Son     x 3  . Diabetes Daughter    Allergies  Allergen Reactions  . Amoxicillin     REACTION: reaction not known  . Aspirin     REACTION: GERD  . Cefpodoxime Proxetil     REACTION: reaction not known  . Clarithromycin     REACTION: reaction not known  . Furosemide     REACTION: doesn't help  . Gabapentin     REACTION: caused severe swellling   Current Outpatient Prescriptions on File Prior to Visit  Medication Sig Dispense Refill  . aspirin EC 81 MG tablet Take 81 mg by mouth daily.      . Cholecalciferol (VITAMIN D-3) 1000 UNITS CAPS Take 1 tablet by mouth daily.    . ciprofloxacin (CIPRO) 250 MG tablet Take 1 tablet (250 mg total) by mouth 2 (two) times daily. 10 tablet 0  .  clotrimazole-betamethasone (LOTRISONE) cream Apply 1 application topically 2 (two) times daily. To affected areas 30 g 1  . Docusate Sodium (EQ STOOL SOFTENER PO) Take 100 mg by mouth daily.    Marland Kitchen doxylamine, Sleep, (EQ SLEEP AID) 25 MG tablet Take 25 mg by mouth at bedtime as needed.    . hydrochlorothiazide (HYDRODIURIL) 25 MG tablet TAKE ONE TABLET BY MOUTH ONCE DAILY 30 tablet 5  . mometasone (ELOCON) 0.1 % cream Apply 1 application topically daily. To affected areas 15 g 1  . polyethylene glycol (MIRALAX / GLYCOLAX) packet Take 17 g by mouth as needed.     . potassium chloride (KLOR-CON M10) 10 MEQ tablet Take 1 tablet (10 mEq total) by mouth daily. 30 tablet 11  . TAZTIA XT 120 MG 24 hr capsule TAKE ONE CAPSULE BY MOUTH ONCE DAILY 30 capsule 2  . tolterodine (DETROL) 2 MG tablet Take 2 mg by mouth 2 (two) times daily.    . [DISCONTINUED] potassium chloride (K-DUR) 10 MEQ tablet TAKE ONE TABLET BY MOUTH EVERY DAY 30 tablet 6   No current facility-administered medications on file prior to visit.     Review of Systems Review of Systems  Constitutional: Negative for fever, appetite change, fatigue and unexpected weight change.  Eyes: Negative for pain and visual disturbance.  Respiratory: Negative for cough and shortness of breath.   Cardiovascular: Negative for cp or palpitations    Gastrointestinal: Negative for nausea, diarrhea and constipation. neg for dark stool or blood in stool or vomiting or heartburn  Genitourinary: Negative for urgency and frequency.  Skin: Negative for pallor or rash   Neurological: Negative for weakness, light-headedness, numbness and headaches.  Hematological: Negative for adenopathy. Does not bruise/bleed easily.  Psychiatric/Behavioral: Negative for dysphoric mood. The patient is not nervous/anxious.         Objective:   Physical Exam  Constitutional: She appears well-developed and well-nourished. No distress.  Well appearing   HENT:  Head:  Normocephalic and atraumatic.  Mouth/Throat: Oropharynx is clear and moist.  Eyes: Conjunctivae and EOM are normal. Pupils are equal, round, and reactive to light. No scleral icterus.  Neck: Normal range of motion.  Neck supple. No JVD present. Carotid bruit is not present. No thyromegaly present.  Cardiovascular: Normal rate, regular rhythm, normal heart sounds and intact distal pulses.  Exam reveals no gallop.   Pulmonary/Chest: Effort normal and breath sounds normal. No respiratory distress. She has no wheezes. She has no rales.  No crackles  Abdominal: Soft. Bowel sounds are normal. She exhibits no distension, no abdominal bruit, no pulsatile midline mass and no mass. There is no hepatosplenomegaly. There is generalized tenderness. There is no rigidity, no rebound, no guarding, no CVA tenderness, no tenderness at McBurney's point and negative Murphy's sign.  Mild tenderness diffusely  No M noted   Musculoskeletal: She exhibits tenderness. She exhibits no edema.  LS scar present  No spinal tenderness Mild tenderness of LS musculature bilat   Very mild tenderness of L first MCP joint without swelling or deformity or crepitus  Lymphadenopathy:    She has no cervical adenopathy.  Neurological: She is alert. She has normal strength and normal reflexes. She displays no atrophy. No cranial nerve deficit or sensory deficit. She exhibits normal muscle tone. Coordination and gait normal.  Skin: Skin is warm and dry. No rash noted. No erythema. No pallor.  Psychiatric: She has a normal mood and affect.          Assessment & Plan:   Problem List Items Addressed This Visit      Cardiovascular and Mediastinum   Essential hypertension    bp is up today BP: (!) 152/85 mmHg suspect because she held her hctz F/u planned-will take it that day Also enc to check at home       Relevant Orders   CBC with Differential/Platelet   Comprehensive metabolic panel   TSH   Lipid panel     Other    Abdominal pain - Primary    C/o lower abd pain but tender diffusely  ? If this is rel to her back DDD- but tenderness is concerning  Will check lab and then order CT of abd/pelvis  F/u after that  Will keep track of symptoms and diet       Relevant Orders   CT Abdomen Pelvis W Contrast   POCT Urinalysis Dipstick (Automated) (Completed)   HYPERCHOLESTEROLEMIA    Due for lipid check -diet controlled Disc goals for lipids and reasons to control them Rev labs with pt from last check  Rev low sat fat diet in detail       Pain of left thumb    Fleeting L MCP joint pain  Not constant  occ stiff  Suspect OA Pt has f/u -may do xray at that time after other issues are figured out  Nl exam

## 2015-07-24 NOTE — Progress Notes (Signed)
Pre visit review using our clinic review tool, if applicable. No additional management support is needed unless otherwise documented below in the visit note. 

## 2015-07-24 NOTE — Assessment & Plan Note (Signed)
C/o lower abd pain but tender diffusely  ? If this is rel to her back DDD- but tenderness is concerning  Will check lab and then order CT of abd/pelvis  F/u after that  Will keep track of symptoms and diet

## 2015-07-24 NOTE — Assessment & Plan Note (Signed)
bp is up today BP: (!) 152/85 mmHg suspect because she held her hctz F/u planned-will take it that day Also enc to check at home

## 2015-07-24 NOTE — Assessment & Plan Note (Signed)
Due for lipid check -diet controlled Disc goals for lipids and reasons to control them Rev labs with pt from last check  Rev low sat fat diet in detail

## 2015-07-25 ENCOUNTER — Encounter: Payer: Self-pay | Admitting: *Deleted

## 2015-07-26 ENCOUNTER — Ambulatory Visit (INDEPENDENT_AMBULATORY_CARE_PROVIDER_SITE_OTHER)
Admission: RE | Admit: 2015-07-26 | Discharge: 2015-07-26 | Disposition: A | Discharge status: Home / Self Care | Payer: Commercial Managed Care - HMO | Source: Ambulatory Visit | Attending: Family Medicine | Admitting: Family Medicine

## 2015-07-26 DIAGNOSIS — R1084 Generalized abdominal pain: Secondary | ICD-10-CM

## 2015-07-26 DIAGNOSIS — R1030 Lower abdominal pain, unspecified: Secondary | ICD-10-CM | POA: Diagnosis not present

## 2015-07-26 MED ORDER — IOHEXOL 300 MG/ML  SOLN
100.0000 mL | Freq: Once | INTRAMUSCULAR | Status: AC | PRN
  Administered 2015-07-26: 100 mL via INTRAVENOUS

## 2015-08-01 ENCOUNTER — Other Ambulatory Visit: Payer: Self-pay | Admitting: Family Medicine

## 2015-08-01 ENCOUNTER — Other Ambulatory Visit: Payer: Self-pay | Admitting: *Deleted

## 2015-08-01 MED ORDER — POTASSIUM CHLORIDE CRYS ER 10 MEQ PO TBCR: 10.0000 meq | EXTENDED_RELEASE_TABLET | Freq: Every day | ORAL | Status: DC

## 2015-08-01 MED ORDER — HYDROCHLOROTHIAZIDE 25 MG PO TABS: 25.0000 mg | ORAL_TABLET | Freq: Every day | ORAL | Status: DC

## 2015-08-01 MED ORDER — DILTIAZEM HCL ER BEADS 120 MG PO CP24: 120.0000 mg | ORAL_CAPSULE | Freq: Every day | ORAL | Status: DC

## 2015-08-01 NOTE — Telephone Encounter (Signed)
Received fax saying pt needs Rxs sent to new [facility], done

## 2015-08-03 ENCOUNTER — Telehealth: Payer: Self-pay | Admitting: Family Medicine

## 2015-08-03 DIAGNOSIS — R1084 Generalized abdominal pain: Secondary | ICD-10-CM

## 2015-08-03 DIAGNOSIS — K388 Other specified diseases of appendix: Secondary | ICD-10-CM

## 2015-08-03 NOTE — Telephone Encounter (Signed)
Done-I will route ref to [facility]

## 2015-08-03 NOTE — Telephone Encounter (Signed)
Pt called to get results of ct scan she had on 2/15

## 2015-08-03 NOTE — Telephone Encounter (Signed)
Pt agrees with referral and wants to see general surgeon in [place], please put referral in, I advise pt our [facility] will call to schedule appt

## 2015-08-03 NOTE — Telephone Encounter (Signed)
GI recommends having pt see a general surgeon first for a review of the CT and exam - I will do the referral, would she rather go to [facility] or [place]?

## 2015-08-03 NOTE — Telephone Encounter (Signed)
Pt called to f/u on CT results see your comments

## 2015-08-03 NOTE — Addendum Note (Signed)
Addended by: Loura Pardon A on: 08/03/2015 07:04 PM   Modules accepted: Orders

## 2015-08-04 ENCOUNTER — Other Ambulatory Visit: Payer: Self-pay | Admitting: Family Medicine

## 2015-08-09 ENCOUNTER — Encounter: Payer: Self-pay | Admitting: Family Medicine

## 2015-08-09 ENCOUNTER — Ambulatory Visit (INDEPENDENT_AMBULATORY_CARE_PROVIDER_SITE_OTHER): Payer: Commercial Managed Care - HMO | Admitting: Family Medicine

## 2015-08-09 VITALS — BP 128/75 | HR 64 | Temp 98.3°F | Ht 62.0 in | Wt 155.5 lb

## 2015-08-09 DIAGNOSIS — K388 Other specified diseases of appendix: Secondary | ICD-10-CM

## 2015-08-09 DIAGNOSIS — I10 Essential (primary) hypertension: Secondary | ICD-10-CM

## 2015-08-09 NOTE — Progress Notes (Signed)
Pre visit review using our clinic review tool, if applicable. No additional management support is needed unless otherwise documented below in the visit note. 

## 2015-08-09 NOTE — Progress Notes (Signed)
Subjective:    Patient ID: Sara [name], female    DOB: [birth year], 82 y.o.   MRN: [id number]  HPI Here for f/u of abd pain comes and goes  Also gets tired easily  CT showed a poss appendix mucocele - ? - she has appt to see Dr Ninfa Linden (gen surg) on Friday to review the CT with her  Most of pain is on the L -not always A little nauseated occ as well  Walking makes it hurt more   Last time she was here bp was up due to skipping hctz  Better today BP Readings from Last 3 Encounters:  08/09/15 132/84  07/24/15 152/85  03/06/15 128/74   improved   Also wt went down 3 lb  Not eating as much  bmi of 28  Is eating smaller amounts   Last visit we discussed her shooting pain in the L thumb  Does not think she needs an xr   Results for orders placed or performed in visit on 07/24/15  CBC with Differential/Platelet  Result Value Ref Range   WBC 6.4 4.0 - 10.5 K/uL   RBC 4.81 3.87 - 5.11 Mil/uL   Hemoglobin 13.5 12.0 - 15.0 g/dL   HCT 41.8 36.0 - 46.0 %   MCV 86.9 78.0 - 100.0 fl   MCHC 32.3 30.0 - 36.0 g/dL   RDW 13.7 11.5 - 15.5 %   Platelets 221.0 150.0 - 400.0 K/uL   Neutrophils Relative % 57.0 43.0 - 77.0 %   Lymphocytes Relative 32.0 12.0 - 46.0 %   Monocytes Relative 9.3 3.0 - 12.0 %   Eosinophils Relative 0.9 0.0 - 5.0 %   Basophils Relative 0.8 0.0 - 3.0 %   Neutro Abs 3.6 1.4 - 7.7 K/uL   Lymphs Abs 2.0 0.7 - 4.0 K/uL   Monocytes Absolute 0.6 0.1 - 1.0 K/uL   Eosinophils Absolute 0.1 0.0 - 0.7 K/uL   Basophils Absolute 0.1 0.0 - 0.1 K/uL  Comprehensive metabolic panel  Result Value Ref Range   Sodium 140 135 - 145 mEq/L   Potassium 3.9 3.5 - 5.1 mEq/L   Chloride 105 96 - 112 mEq/L   CO2 30 19 - 32 mEq/L   Glucose, Bld 90 70 - 99 mg/dL   BUN 16 6 - 23 mg/dL   Creatinine, Ser 0.92 0.40 - 1.20 mg/dL   Total Bilirubin 0.7 0.2 - 1.2 mg/dL   Alkaline Phosphatase 61 39 - 117 U/L   AST 16 0 - 37 U/L   ALT 12 0 - 35 U/L   Total Protein 7.4 6.0 - 8.3  g/dL   Albumin 4.3 3.5 - 5.2 g/dL   Calcium 9.8 8.4 - 10.5 mg/dL   GFR 75.31 >60.00 mL/min  TSH  Result Value Ref Range   TSH 1.28 0.35 - 4.50 uIU/mL  Lipid panel  Result Value Ref Range   Cholesterol 194 0 - 200 mg/dL   Triglycerides 153.0 (H) 0.0 - 149.0 mg/dL   HDL 53.70 >39.00 mg/dL   VLDL 30.6 0.0 - 40.0 mg/dL   LDL Cholesterol 110 (H) 0 - 99 mg/dL   Total CHOL/HDL Ratio 4    NonHDL 140.47   POCT Urinalysis Dipstick (Automated)  Result Value Ref Range   Color, UA Yellow    Clarity, UA Clear    Glucose, UA Neg.    Bilirubin, UA Neg.    Ketones, UA Neg.    Spec Grav, UA 1.020    Blood,  UA Neg.    pH, UA 6.0    Protein, UA Neg.    Urobilinogen, UA 0.2    Nitrite, UA Neg.    Leukocytes, UA Negative Negative     Patient Active Problem List   Diagnosis Date Noted  . 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 Pain of left thumb 07/24/2015  . Abdominal pain 07/24/2015  . Abnormal urine odor 03/06/2015  . Eczema 03/06/2015  . Caregiver stress 07/22/2014  . Left foot pain 04/15/2014  . Rash and nonspecific skin eruption 04/15/2014  . Vertigo, benign paroxysmal 02/11/2014  . Hyperglycemia 02/11/2014  . Arm swelling 12/07/2012  . Intertrigo labialis 12/07/2012  . Frequent urination 09/04/2012  . Ingrown hair 09/04/2012  . Breast pain 02/08/2011  . Hip pain, left 02/08/2011  . CONSTIPATION, CHRONIC 07/03/2010  . Vitamin D deficiency 09/22/2008  . OSTEOPENIA 06/24/2008  . HYPERCHOLESTEROLEMIA 12/08/2006  . Essential hypertension 12/08/2006  . GERD 12/08/2006  . SPINAL STENOSIS 12/08/2006   Past Medical History  Diagnosis Date  . GERD (gastroesophageal reflux disease)   . Hypertension   . Spinal stenosis   . Osteopenia   . Frequent UTI   . Clinical trial participant     [facility] studies in familial dementia  . Allergy   . Osteoarthritis     hips, back  . Pneumonia   . Stroke [facility]) 1985   Past Surgical History  Procedure Laterality Date  . Abdominal  hysterectomy      partial has one ovary  . Rotator cuff surgery Right 2010  . Cataract extraction Bilateral   . Appendectomy    . Lumbar disc surgery      x 3   Social History  Substance Use Topics  . Smoking status: Never Smoker   . Smokeless tobacco: Never Used  . Alcohol Use: No   Family History  Problem Relation Age of Onset  . Other Mother     kidney tumor  . Hypertension Mother   . Diabetes Sister   . Lung cancer Brother   . Diabetes Brother   . Diabetes Sister     x 2  . Colon cancer Maternal Uncle   . Esophageal cancer Neg Hx   . Rectal cancer Neg Hx   . Stomach cancer Neg Hx   . Other Sister     intestine burst  . Breast cancer Maternal Aunt     x 2  . Breast cancer Daughter   . Diabetes Son     x 3  . Diabetes Daughter    Allergies  Allergen Reactions  . Amoxicillin     REACTION: reaction not known  . Aspirin     REACTION: GERD  . Cefpodoxime Proxetil     REACTION: reaction not known  . Clarithromycin     REACTION: reaction not known  . Furosemide     REACTION: doesn't help  . Gabapentin     REACTION: caused severe swellling   Current Outpatient Prescriptions on File Prior to Visit  Medication Sig Dispense Refill  . aspirin EC 81 MG tablet Take 81 mg by mouth daily.      . Cholecalciferol (VITAMIN D-3) 1000 UNITS CAPS Take 1 tablet by mouth daily.    . ciprofloxacin (CIPRO) 250 MG tablet Take 1 tablet (250 mg total) by mouth 2 (two) times daily. 10 tablet 0  . clotrimazole-betamethasone (LOTRISONE) cream Apply 1 application topically 2 (two) times daily. To affected areas 30 g 1  .  diltiazem (TAZTIA XT) 120 MG 24 hr capsule Take 1 capsule (120 mg total) by mouth daily. 90 capsule 0  . Docusate Sodium (EQ STOOL SOFTENER PO) Take 100 mg by mouth daily.    Marland Kitchen doxylamine, Sleep, (EQ SLEEP AID) 25 MG tablet Take 25 mg by mouth at bedtime as needed.    . hydrochlorothiazide (HYDRODIURIL) 25 MG tablet Take 1 tablet (25 mg total) by mouth daily. 90 tablet  0  . mometasone (ELOCON) 0.1 % cream Apply 1 application topically daily. To affected areas 15 g 1  . polyethylene glycol (MIRALAX / GLYCOLAX) packet Take 17 g by mouth as needed.     . potassium chloride (KLOR-CON M10) 10 MEQ tablet Take 1 tablet (10 mEq total) by mouth daily. 90 tablet 0  . tolterodine (DETROL) 2 MG tablet Take 2 mg by mouth 2 (two) times daily.    . [DISCONTINUED] potassium chloride (K-DUR) 10 MEQ tablet TAKE ONE TABLET BY MOUTH EVERY DAY 30 tablet 6   No current facility-administered medications on file prior to visit.     Review of Systems Review of Systems  Constitutional: Negative for fever, appetite change, fatigue and unexpected weight change.  Eyes: Negative for pain and visual disturbance.  Respiratory: Negative for cough and shortness of breath.   Cardiovascular: Negative for cp or palpitations    Gastrointestinal: Negative for  diarrhea and constipation. pos for lower abd pain and nausea /no vomiting  Genitourinary: Negative for urgency and frequency.  Skin: Negative for pallor or rash   Neurological: Negative for weakness, light-headedness, numbness and headaches.  Hematological: Negative for adenopathy. Does not bruise/bleed easily.  Psychiatric/Behavioral: Negative for dysphoric mood. The patient is not nervous/anxious.         Objective:   Physical Exam  Constitutional: She appears well-developed and well-nourished. No distress.  Well appearing   HENT:  Head: Normocephalic and atraumatic.  Mouth/Throat: Oropharynx is clear and moist.  Eyes: Conjunctivae and EOM are normal. Pupils are equal, round, and reactive to light. No scleral icterus.  Neck: Normal range of motion. Neck supple. No JVD present. Carotid bruit is not present. No thyromegaly present.  Cardiovascular: Normal rate, regular rhythm, normal heart sounds and intact distal pulses.  Exam reveals no gallop.   Pulmonary/Chest: Effort normal and breath sounds normal. No respiratory distress.  She has no wheezes. She has no rales.  No crackles  Abdominal: Soft. Bowel sounds are normal. She exhibits no distension, no pulsatile liver, no abdominal bruit and no mass. There is no hepatosplenomegaly. There is tenderness in the right lower quadrant and left lower quadrant. There is no rebound, no guarding, no CVA tenderness, no tenderness at McBurney's point and negative Murphy's sign.  Musculoskeletal: She exhibits no edema.  Lymphadenopathy:    She has no cervical adenopathy.  Neurological: She is alert. She has normal reflexes.  Skin: Skin is warm and dry. No rash noted. No erythema. No pallor.  Psychiatric: She has a normal mood and affect.          Assessment & Plan:   Problem List Items Addressed This Visit      Cardiovascular and Mediastinum   Essential hypertension    bp in fair control at this time  BP Readings from Last 1 Encounters:  08/09/15 128/75   No changes needed Disc lifstyle change with low sodium diet and exercise  Improved today back on her hctz Will continue current tx         Digestive  Mucocele, appendix - Primary    Rev CT with pt Unsure if this is the cause of her pain  Will f/u with surgeon this wk as planned Reassuring exam

## 2015-08-09 NOTE — Patient Instructions (Signed)
Blood pressure is ok  Labs look ok  Here is is a copy of your CT scan See the surgeon as planned   Take care of yourself

## 2015-08-10 NOTE — Assessment & Plan Note (Signed)
bp in fair control at this time  BP Readings from Last 1 Encounters:  08/09/15 128/75   No changes needed Disc lifstyle change with low sodium diet and exercise  Improved today back on her hctz Will continue current tx

## 2015-08-10 NOTE — Assessment & Plan Note (Signed)
Rev CT with pt Unsure if this is the cause of her pain  Will f/u with surgeon this wk as planned Reassuring exam

## 2015-08-11 ENCOUNTER — Other Ambulatory Visit: Payer: Self-pay | Admitting: Surgery

## 2015-08-11 DIAGNOSIS — R1032 Left lower quadrant pain: Secondary | ICD-10-CM | POA: Diagnosis not present

## 2015-09-25 DIAGNOSIS — Z1231 Encounter for screening mammogram for malignant neoplasm of breast: Secondary | ICD-10-CM | POA: Diagnosis not present

## 2015-09-25 DIAGNOSIS — Z803 Family history of malignant neoplasm of breast: Secondary | ICD-10-CM | POA: Diagnosis not present

## 2015-10-02 ENCOUNTER — Encounter: Payer: Self-pay | Admitting: Family Medicine

## 2015-10-20 ENCOUNTER — Other Ambulatory Visit: Payer: Self-pay | Admitting: Family Medicine

## 2015-12-04 ENCOUNTER — Ambulatory Visit (INDEPENDENT_AMBULATORY_CARE_PROVIDER_SITE_OTHER): Payer: Commercial Managed Care - HMO | Admitting: Family Medicine

## 2015-12-04 ENCOUNTER — Encounter: Payer: Self-pay | Admitting: Family Medicine

## 2015-12-04 ENCOUNTER — Ambulatory Visit (INDEPENDENT_AMBULATORY_CARE_PROVIDER_SITE_OTHER)
Admission: RE | Admit: 2015-12-04 | Discharge: 2015-12-04 | Disposition: A | Discharge status: Home / Self Care | Payer: Commercial Managed Care - HMO | Source: Ambulatory Visit | Attending: Family Medicine | Admitting: Family Medicine

## 2015-12-04 VITALS — BP 140/80 | HR 66 | Temp 97.9°F | Ht 62.0 in | Wt 153.8 lb

## 2015-12-04 DIAGNOSIS — S92501A Displaced unspecified fracture of right lesser toe(s), initial encounter for closed fracture: Secondary | ICD-10-CM

## 2015-12-04 DIAGNOSIS — M79671 Pain in right foot: Secondary | ICD-10-CM

## 2015-12-04 DIAGNOSIS — S99921A Unspecified injury of right foot, initial encounter: Secondary | ICD-10-CM | POA: Diagnosis not present

## 2015-12-04 DIAGNOSIS — M7989 Other specified soft tissue disorders: Secondary | ICD-10-CM | POA: Diagnosis not present

## 2015-12-04 DIAGNOSIS — S92511A Displaced fracture of proximal phalanx of right lesser toe(s), initial encounter for closed fracture: Secondary | ICD-10-CM | POA: Diagnosis not present

## 2015-12-04 DIAGNOSIS — M79674 Pain in right toe(s): Secondary | ICD-10-CM

## 2015-12-04 NOTE — Progress Notes (Signed)
Dr. Frederico Hamman T. Dena Esperanza, MD, [facility] [place], [address] Phone: [phone] Fax: [phone]  12/04/2015  Patient: Sara [name], MRN: [id number], DOB: 07-Sep-1931, 82 y.o.  Primary Physician:  Loura Pardon, MD   Chief Complaint  Patient presents with  . Toe Pain    Hit toe last Monday-Right Pinky Toe-Pain radiate up leg  . Foot Swelling    Right   Subjective:   Sara [name] is a 82 y.o. very pleasant female patient who presents with the following:  R 5th toe fracture. She had toe last Monday on her pinky toe, and she has continued to have pain and significant swelling in the right foot and she also has had some pain that radiated up her leg.  Pain is worst in the fifth toe, but there is pain in other locations as well.  She is ambulating without any difficulty right now.  She has Buddy taped her toes.  Past Medical History, Surgical History, Social History, Family History, Problem List, Medications, and Allergies have been reviewed and updated if relevant.  Patient Active Problem List   Diagnosis Date Noted  . 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 Pain of left thumb 07/24/2015  . Abdominal pain 07/24/2015  . Abnormal urine odor 03/06/2015  . Eczema 03/06/2015  . Caregiver stress 07/22/2014  . Left foot pain 04/15/2014  . Rash and nonspecific skin eruption 04/15/2014  . Vertigo, benign paroxysmal 02/11/2014  . Hyperglycemia 02/11/2014  . Arm swelling 12/07/2012  . Intertrigo labialis 12/07/2012  . Frequent urination 09/04/2012  . Ingrown hair 09/04/2012  . Breast pain 02/08/2011  . Hip pain, left 02/08/2011  . CONSTIPATION, CHRONIC 07/03/2010  . Vitamin D deficiency 09/22/2008  . OSTEOPENIA 06/24/2008  . HYPERCHOLESTEROLEMIA 12/08/2006  . Essential hypertension 12/08/2006  . GERD 12/08/2006  . SPINAL STENOSIS 12/08/2006    Past Medical History  Diagnosis Date  . GERD (gastroesophageal  reflux disease)   . Hypertension   . Spinal stenosis   . Osteopenia   . Frequent UTI   . Clinical trial participant     [facility] studies in familial dementia  . Allergy   . Osteoarthritis     hips, back  . Pneumonia   . Stroke [facility]) 1985    Past Surgical History  Procedure Laterality Date  . Abdominal hysterectomy      partial has one ovary  . Rotator cuff surgery Right 2010  . Cataract extraction Bilateral   . Appendectomy    . Lumbar disc surgery      x 3    Social History   Social History  . Marital Status: Married    Spouse Name: N/A  . Number of Children: 6  . Years of Education: N/A   Occupational History  . RETIRED    Social History Main Topics  . Smoking status: Never Smoker   . Smokeless tobacco: Never Used  . Alcohol Use: No  . Drug Use: No  . Sexual Activity: Not on file   Other Topics Concern  . Not on file   Social History Narrative    Family History  Problem Relation Age of Onset  . Other Mother     kidney tumor  . Hypertension Mother   . Diabetes Sister   . Lung cancer Brother   . Diabetes Brother   . Diabetes Sister     x 2  . Colon cancer Maternal Uncle   .  Esophageal cancer Neg Hx   . Rectal cancer Neg Hx   . Stomach cancer Neg Hx   . Other Sister     intestine burst  . Breast cancer Maternal Aunt     x 2  . Breast cancer Daughter   . Diabetes Son     x 3  . Diabetes Daughter     Allergies  Allergen Reactions  . Amoxicillin     REACTION: reaction not known  . Aspirin     REACTION: GERD  . Cefpodoxime Proxetil     REACTION: reaction not known  . Clarithromycin     REACTION: reaction not known  . Furosemide     REACTION: doesn't help  . Gabapentin     REACTION: caused severe swellling    Medication list reviewed and updated in full in [place].   GEN: No acute illnesses, no fevers, chills. GI: No n/v/d, eating normally Pulm: No SOB Interactive and getting along well at home.  Otherwise,  ROS is as per the HPI.  Objective:   BP 140/80 mmHg  Pulse 66  Temp(Src) 97.9 F (36.6 C) (Oral)  Ht 5\' 2"  (1.575 m)  Wt 153 lb 12 oz (69.741 kg)  BMI 28.11 kg/m2  GEN: WDWN, NAD, Non-toxic, A & O x 3 HEENT: Atraumatic, Normocephalic. Neck supple. No masses, No LAD. Ears and Nose: No external deformity. EXTR: No c/c/e NEURO Normal gait.  PSYCH: Normally interactive. Conversant. Not depressed or anxious appearing.  Calm demeanor.   Pain and swelling in the fifth greater than fourth on the right toes.  Nontender in other toes.  Nontender in all metatarsals, and the midfoot.  All ligaments are nontender and stable.  The medial and lateral malleoli are stable and nontender.  Fifth metatarsal is nontender.  The tibia and fibula are nontender.  Laboratory and Imaging Data: Dg Foot Complete Right  12/04/2015  CLINICAL DATA:  Right fifth toe pain status post injury 1 week ago. Pain and swelling. EXAM: RIGHT FOOT COMPLETE - 3+ VIEW COMPARISON:  Plain film of the right foot dated 07/26/2009. FINDINGS: Displaced fracture within the midportion of the right fifth proximal phalanx, with approximately 1-2 mm diastases, and slight overriding of the fracture fragments. Alignment at the adjacent fifth metatarsophalangeal and interphalangeal joint spaces remain normal. Overlying soft tissue swelling noted. No other fracture line or displaced fracture fragment identified. Chronic spurring node along the posterior margin of the calcaneus and at the base of the first metatarsal bone. IMPRESSION: Displaced fracture within the midportion of the right fifth proximal phalanx. Electronically Signed   By: Franki Cabot M.D.   On: 12/04/2015 13:49     Assessment and Plan:   Fracture of fifth toe, right, closed, initial encounter  Right foot injury, initial encounter - Plan: DG Foot Complete Right  Pain and swelling of toe of right foot  Midshaft fifth proximal phalanx fracture of the right foot with  displacement.  Advised to wear comfortable shoes, and Buddy taping for support with the adjacent digit.  This does not appear to be an operative fracture.  I reassured her regarding the rest of her foot and ankle.  Follow-up: prn  Orders Placed This Encounter  Procedures  . DG Foot Complete Right    Signed,  Vallarie Fei T. Steve Gregg, MD   Patient's Medications  New Prescriptions   No medications on file  Previous Medications   ASPIRIN EC 81 MG TABLET    Take 81 mg by mouth  daily.     CHOLECALCIFEROL (VITAMIN D-3) 1000 UNITS CAPS    Take 1 tablet by mouth daily.   CLOTRIMAZOLE-BETAMETHASONE (LOTRISONE) CREAM    Apply 1 application topically 2 (two) times daily. To affected areas   DILTIAZEM (TIAZAC) 120 MG 24 HR CAPSULE    TAKE 1 CAPSULE EVERY DAY   DOCUSATE SODIUM (EQ STOOL SOFTENER PO)    Take 100 mg by mouth daily.   DOXYLAMINE, SLEEP, (EQ SLEEP AID) 25 MG TABLET    Take 25 mg by mouth at bedtime as needed.   HYDROCHLOROTHIAZIDE (HYDRODIURIL) 25 MG TABLET    TAKE 1 TABLET EVERY DAY   MOMETASONE (ELOCON) 0.1 % CREAM    Apply 1 application topically daily. To affected areas   POLYETHYLENE GLYCOL (MIRALAX / GLYCOLAX) PACKET    Take 17 g by mouth as needed.    POTASSIUM CHLORIDE (K-DUR) 10 MEQ TABLET    TAKE 1 TABLET EVERY DAY   TOLTERODINE (DETROL) 2 MG TABLET    Take 2 mg by mouth 2 (two) times daily.  Modified Medications   No medications on file  Discontinued Medications   CIPROFLOXACIN (CIPRO) 250 MG TABLET    Take 1 tablet (250 mg total) by mouth 2 (two) times daily.

## 2015-12-04 NOTE — Progress Notes (Signed)
Pre visit review using our clinic review tool, if applicable. No additional management support is needed unless otherwise documented below in the visit note. 

## 2015-12-26 ENCOUNTER — Other Ambulatory Visit: Payer: Self-pay | Admitting: Family Medicine

## 2016-03-04 ENCOUNTER — Other Ambulatory Visit: Payer: Self-pay | Admitting: Family Medicine

## 2016-03-15 ENCOUNTER — Encounter: Payer: Self-pay | Admitting: Family Medicine

## 2016-03-15 ENCOUNTER — Ambulatory Visit (INDEPENDENT_AMBULATORY_CARE_PROVIDER_SITE_OTHER): Payer: Commercial Managed Care - HMO | Admitting: Family Medicine

## 2016-03-15 VITALS — BP 124/78 | HR 54 | Temp 98.3°F | Ht 62.0 in | Wt 151.5 lb

## 2016-03-15 DIAGNOSIS — Z23 Encounter for immunization: Secondary | ICD-10-CM

## 2016-03-15 DIAGNOSIS — R1013 Epigastric pain: Secondary | ICD-10-CM | POA: Diagnosis not present

## 2016-03-15 DIAGNOSIS — K388 Other specified diseases of appendix: Secondary | ICD-10-CM

## 2016-03-15 DIAGNOSIS — L304 Erythema intertrigo: Secondary | ICD-10-CM | POA: Insufficient documentation

## 2016-03-15 DIAGNOSIS — H6123 Impacted cerumen, bilateral: Secondary | ICD-10-CM

## 2016-03-15 MED ORDER — CLOTRIMAZOLE-BETAMETHASONE 1-0.05 % EX CREA: 1.0000 "application " | TOPICAL_CREAM | Freq: Two times a day (BID) | CUTANEOUS | 1 refills | Status: DC

## 2016-03-15 MED ORDER — RANITIDINE HCL 150 MG PO CAPS: 150.0000 mg | ORAL_CAPSULE | Freq: Two times a day (BID) | ORAL | 11 refills | Status: DC

## 2016-03-15 NOTE — Progress Notes (Signed)
Subjective:    Patient ID: Sara [name], female    DOB: 17-Apr-1932, 82 y.o.   MRN: [id number]  HPI Here for f/u of GI issues and fatigue   In retrospect -thinks she had a "bug" She had an episode of n/v/d last week - that did get better  For a few weeks if feels like food is sitting in her esophagus - not moving down - full feeling but no pain or heartburn  (? If food is digesting)  Burping helps a lot  Of note-this started before the virus she had   Diet has not changes  occ tylenol / no nsaids  Takes a baby 81 mg daily  No issues with dry mouth or swallowing - is on detrol   Constipation is well controlled with miralax     Pt had CT of abd in 2/17 and there was a ? Of mucocele of appendix  She saw Dr Rush Farmer (surg) - and he thought it was residual fluid from past appendectomy  Also had evid of a small R groin hernia  He recommended re check CT and f/u in 6 mo  She still has occ pain in that area   She has chronic constipation - manages with miralax   Review of Systems    Review of Systems  Constitutional: Negative for fever, appetite change, fatigue and unexpected weight change.  Eyes: Negative for pain and visual disturbance.  ENT pos for hearing loss - ? If wax in ears  Respiratory: Negative for cough and shortness of breath.   Cardiovascular: Negative for cp or palpitations    Gastrointestinal: Negative for nausea, diarrhea and constipation. pos for dyspepsia Genitourinary: Negative for urgency and frequency.  Skin: Negative for pallor or rash   Neurological: Negative for weakness, light-headedness, numbness and headaches.  Hematological: Negative for adenopathy. Does not bruise/bleed easily.  Psychiatric/Behavioral: Negative for dysphoric mood. The patient is not nervous/anxious.       Objective:   Physical Exam  Constitutional: She appears well-developed and well-nourished. No distress.  Well appearing   HENT:  Head: Normocephalic and  atraumatic.  Mouth/Throat: Oropharynx is clear and moist.  Eyes: Conjunctivae and EOM are normal. Pupils are equal, round, and reactive to light. No scleral icterus.  Neck: Normal range of motion. Neck supple.  Cardiovascular: Normal rate, regular rhythm and normal heart sounds.   Pulmonary/Chest: Effort normal and breath sounds normal. No respiratory distress. She has no wheezes. She has no rales.  Abdominal: Soft. Bowel sounds are normal. She exhibits no distension, no pulsatile liver, no abdominal bruit, no pulsatile midline mass and no mass. There is no hepatosplenomegaly. There is tenderness in the epigastric area. There is no rigidity, no rebound, no guarding, no CVA tenderness, no tenderness at McBurney's point and negative Murphy's sign.  Mild epigastric tenderness  Lymphadenopathy:    She has no cervical adenopathy.  Neurological: She is alert. No cranial nerve deficit. She exhibits normal muscle tone. Coordination normal.  Skin: Skin is warm and dry. No erythema. No pallor.  Psychiatric: She has a normal mood and affect.          Assessment & Plan:   Problem List Items Addressed This Visit      Digestive   Mucocele, appendix    This was thought to be a fluid collection on last CT rev by surgeon / also poss R early inguinal hernia  She is due for 6 mo f/u CT but not having symptoms- so  declines it for now  inst her to call when she is ready to get it         Nervous and Auditory   Bilateral impacted cerumen    inst to use debrox on a schedule for partial cerumen impaction  Will f/u if no imp for ear irrigation        Musculoskeletal and Integument   Intertrigo    Refilled clotrimazole-betamethasone  For prn use  inst to keep area as clean and dry as possible        Other   Dyspepsia    Will try zantac 150 mg bid (consider ppi if no imp)  Disc diet/handout given for gerd  Also eat small portions/ chew well and eat slower Update if no imp or if dysphagia  symptoms worsen          Other Visit Diagnoses    Need for influenza vaccination    -  Primary   Relevant Orders   Flu Vaccine QUAD 36+ mos IM (Completed)

## 2016-03-15 NOTE — Patient Instructions (Addendum)
You have some ear wax but not totally obstructive Get Debrox or a similar product over the counter -use it as directed once or twice a month Flu shot today  Take ranitidine (px zantac) 150 mg twice daily for your stomach symptoms  Also avoid foods that cause acid reflux-see the handout  I sent your cream to the pharmacy  You are due for a 6 month re check of CT scan to look at your lower right abdomen - let us know when you want to do that  Keep using your miralax   Follow up with me in about 2 months for your stomach  If your symptoms do not improve-let me know earlier

## 2016-03-17 NOTE — Assessment & Plan Note (Signed)
Refilled clotrimazole-betamethasone  For prn use  inst to keep area as clean and dry as possible

## 2016-03-17 NOTE — Assessment & Plan Note (Signed)
This was thought to be a fluid collection on last CT rev by surgeon / also poss R early inguinal hernia  She is due for 6 mo f/u CT but not having symptoms- so declines it for now  inst her to call when she is ready to get it

## 2016-03-17 NOTE — Assessment & Plan Note (Signed)
inst to use debrox on a schedule for partial cerumen impaction  Will f/u if no imp for ear irrigation

## 2016-03-17 NOTE — Assessment & Plan Note (Signed)
Will try zantac 150 mg bid (consider ppi if no imp)  Disc diet/handout given for gerd  Also eat small portions/ chew well and eat slower Update if no imp or if dysphagia symptoms worsen

## 2016-03-27 DIAGNOSIS — H524 Presbyopia: Secondary | ICD-10-CM | POA: Diagnosis not present

## 2016-04-01 DIAGNOSIS — K388 Other specified diseases of appendix: Secondary | ICD-10-CM | POA: Diagnosis not present

## 2016-04-17 ENCOUNTER — Other Ambulatory Visit: Payer: Self-pay | Admitting: Surgery

## 2016-04-17 DIAGNOSIS — K388 Other specified diseases of appendix: Secondary | ICD-10-CM

## 2016-04-25 ENCOUNTER — Ambulatory Visit
Admission: RE | Admit: 2016-04-25 | Discharge: 2016-04-25 | Disposition: A | Discharge status: Home / Self Care | Payer: Commercial Managed Care - HMO | Source: Ambulatory Visit | Attending: Surgery | Admitting: Surgery

## 2016-04-25 DIAGNOSIS — R1031 Right lower quadrant pain: Secondary | ICD-10-CM | POA: Diagnosis not present

## 2016-04-25 DIAGNOSIS — K388 Other specified diseases of appendix: Secondary | ICD-10-CM

## 2016-04-25 MED ORDER — IOPAMIDOL (ISOVUE-300) INJECTION 61%
100.0000 mL | Freq: Once | INTRAVENOUS | Status: AC | PRN
  Administered 2016-04-25: 100 mL via INTRAVENOUS

## 2016-04-26 ENCOUNTER — Ambulatory Visit: Payer: Commercial Managed Care - HMO | Admitting: Family Medicine

## 2016-04-30 ENCOUNTER — Ambulatory Visit (INDEPENDENT_AMBULATORY_CARE_PROVIDER_SITE_OTHER): Payer: Commercial Managed Care - HMO | Admitting: Internal Medicine

## 2016-04-30 ENCOUNTER — Encounter: Payer: Self-pay | Admitting: Internal Medicine

## 2016-04-30 VITALS — BP 136/80 | HR 59 | Temp 98.7°F | Wt 153.5 lb

## 2016-04-30 DIAGNOSIS — B9789 Other viral agents as the cause of diseases classified elsewhere: Secondary | ICD-10-CM | POA: Diagnosis not present

## 2016-04-30 DIAGNOSIS — J069 Acute upper respiratory infection, unspecified: Secondary | ICD-10-CM

## 2016-04-30 MED ORDER — BENZONATATE 100 MG PO CAPS
100.0000 mg | ORAL_CAPSULE | Freq: Two times a day (BID) | ORAL | 0 refills | Status: DC | PRN
Start: 2016-04-30 — End: 2016-11-28

## 2016-04-30 NOTE — Progress Notes (Signed)
HPI  Pt presents to the clinic today with c/o runny nose and cough. This started 3 weeks ago. She is blowing clear mucous out of her nose. The cough is productive of clear mucous. It seems worse during the day, when she is talking. She denies fever, chills or body aches. She has felt a little fatigued and short of breath. She has taken Coricidin and Tussin with some relief. She does have a history of seasonal allergies, but does not take anything OTC for this. She has not had sick contacts that she is aware of.   Review of Systems        Past Medical History:  Diagnosis Date  . Allergy   . Clinical trial participant    [facility] studies in familial dementia  . Frequent UTI   . GERD (gastroesophageal reflux disease)   . Hypertension   . Osteoarthritis    hips, back  . Osteopenia   . Pneumonia   . Spinal stenosis   . Stroke [facility]) 1985    Family History  Problem Relation Age of Onset  . Other Mother     kidney tumor  . Hypertension Mother   . Diabetes Sister   . Lung cancer Brother   . Diabetes Brother   . Diabetes Sister     x 2  . Colon cancer Maternal Uncle   . Esophageal cancer Neg Hx   . Rectal cancer Neg Hx   . Stomach cancer Neg Hx   . Other Sister     intestine burst  . Breast cancer Maternal Aunt     x 2  . Breast cancer Daughter   . Diabetes Son     x 3  . Diabetes Daughter     Social History   Social History  . Marital status: Married    Spouse name: N/A  . Number of children: 6  . Years of education: N/A   Occupational History  . RETIRED Retired   Social History Main Topics  . Smoking status: Never Smoker  . Smokeless tobacco: Never Used  . Alcohol use No  . Drug use: No  . Sexual activity: Not on file   Other Topics Concern  . Not on file   Social History Narrative  . No narrative on file    Allergies  Allergen Reactions  . Amoxicillin     REACTION: reaction not known  . Aspirin     REACTION: GERD  . Cefpodoxime  Proxetil     REACTION: reaction not known  . Clarithromycin     REACTION: reaction not known  . Furosemide     REACTION: doesn't help  . Gabapentin     REACTION: caused severe swellling     Constitutional: Denies headache, fatigue, fever or abrupt weight changes.  HEENT:  Positive runny nose. Denies eye redness, eye pain, pressure behind the eyes, facial pain, nasal congestion, ear pain, ringing in the ears, wax buildup, or bloody nose. Respiratory: Positive cough. Denies difficulty breathing or shortness of breath.  Cardiovascular: Denies chest pain, chest tightness, palpitations or swelling in the hands or feet.   No other specific complaints in a complete review of systems (except as listed in HPI above).  Objective:   BP 136/80   Pulse (!) 59   Temp 98.7 F (37.1 C) (Oral)   Wt 153 lb 8 oz (69.6 kg)   SpO2 97%   BMI 28.08 kg/m  Wt Readings from Last 3 Encounters:  04/30/16 153 lb 8 oz (69.6 kg)  03/15/16 151 lb 8 oz (68.7 kg)  12/04/15 153 lb 12 oz (69.7 kg)     General: Appears her stated age, in NAD. HEENT: Head: normal shape and size; Eyes: sclera white, no icterus, conjunctiva pink; Nose: mucosa boogy and moist, septum midline; Throat/Mouth:  Teeth present, mucosa pink and moist, no exudate noted, no lesions or ulcerations noted.  Neck: No cervical lymphadenopathy.  Pulmonary/Chest: Normal effort and positive vesicular breath sounds. No respiratory distress. No wheezes, rales or ronchi noted.       Assessment & Plan:   Viral cough:  Get some rest and drink plenty of water Start Claritin OTC eRx for Tessalon Pearls 100 mg TID prn  RTC as needed or if symptoms persist.   Webb Silversmith, NP

## 2016-04-30 NOTE — Patient Instructions (Signed)
Cough, Adult Coughing is a reflex that clears your throat and your airways. Coughing helps to heal and protect your lungs. It is normal to cough occasionally, but a cough that happens with other symptoms or lasts a long time may be a sign of a condition that needs treatment. A cough may last only 2-3 weeks (acute), or it may last longer than 8 weeks (chronic). What are the causes? Coughing is commonly caused by:  Breathing in substances that irritate your lungs.  A viral or bacterial respiratory infection.  Allergies.  Asthma.  Postnasal drip.  Smoking.  Acid backing up from the stomach into the esophagus (gastroesophageal reflux).  Certain medicines.  Chronic lung problems, including COPD (or rarely, lung cancer).  Other medical conditions such as heart failure.  Follow these instructions at home: Pay attention to any changes in your symptoms. Take these actions to help with your discomfort:  Take medicines only as told by your health care provider. ? If you were prescribed an antibiotic medicine, take it as told by your health care provider. Do not stop taking the antibiotic even if you start to feel better. ? Talk with your health care provider before you take a cough suppressant medicine.  Drink enough fluid to keep your urine clear or pale yellow.  If the air is dry, use a cold steam vaporizer or humidifier in your bedroom or your home to help loosen secretions.  Avoid anything that causes you to cough at work or at home.  If your cough is worse at night, try sleeping in a semi-upright position.  Avoid cigarette smoke. If you smoke, quit smoking. If you need help quitting, ask your health care provider.  Avoid caffeine.  Avoid alcohol.  Rest as needed.  Contact a health care provider if:  You have new symptoms.  You cough up pus.  Your cough does not get better after 2-3 weeks, or your cough gets worse.  You cannot control your cough with suppressant  medicines and you are losing sleep.  You develop pain that is getting worse or pain that is not controlled with pain medicines.  You have a fever.  You have unexplained weight loss.  You have night sweats. Get help right away if:  You cough up blood.  You have difficulty breathing.  Your heartbeat is very fast. This information is not intended to replace advice given to you by your health care provider. Make sure you discuss any questions you have with your health care provider. Document Released: 11/23/2010 Document Revised: 11/02/2015 Document Reviewed: 08/03/2014 Elsevier Interactive Patient Education  2017 Elsevier Inc.  

## 2016-05-08 ENCOUNTER — Other Ambulatory Visit: Payer: Self-pay | Admitting: Family Medicine

## 2016-05-14 NOTE — Telephone Encounter (Signed)
Pt called to ck on refills sent from [facility]; advised pt approved and sent to [place] on 05/08/16. Pt voiced understanding.

## 2016-08-16 ENCOUNTER — Telehealth: Payer: Self-pay | Admitting: Family Medicine

## 2016-08-16 NOTE — Telephone Encounter (Signed)
LVM for pt to call back and schedule AWV + labs with Lesia and CPE with PCP. °

## 2016-09-25 DIAGNOSIS — Z1231 Encounter for screening mammogram for malignant neoplasm of breast: Secondary | ICD-10-CM | POA: Diagnosis not present

## 2016-09-25 DIAGNOSIS — Z803 Family history of malignant neoplasm of breast: Secondary | ICD-10-CM | POA: Diagnosis not present

## 2016-10-01 ENCOUNTER — Other Ambulatory Visit: Payer: Self-pay | Admitting: Family Medicine

## 2016-10-01 ENCOUNTER — Encounter: Payer: Self-pay | Admitting: Family Medicine

## 2016-10-01 NOTE — Telephone Encounter (Signed)
Scheduled 12/09/16

## 2016-10-02 ENCOUNTER — Encounter: Payer: Self-pay | Admitting: *Deleted

## 2016-11-28 ENCOUNTER — Telehealth: Payer: Self-pay | Admitting: Family Medicine

## 2016-11-28 ENCOUNTER — Observation Stay (HOSPITAL_COMMUNITY)
Admission: EM | Admit: 2016-11-28 | Discharge: 2016-11-30 | Disposition: A | Discharge status: Home / Self Care | Payer: Medicare HMO | Attending: Internal Medicine | Admitting: Internal Medicine

## 2016-11-28 ENCOUNTER — Emergency Department (HOSPITAL_COMMUNITY): Payer: Medicare HMO

## 2016-11-28 ENCOUNTER — Encounter (HOSPITAL_COMMUNITY): Payer: Self-pay | Admitting: Emergency Medicine

## 2016-11-28 DIAGNOSIS — Z8673 Personal history of transient ischemic attack (TIA), and cerebral infarction without residual deficits: Secondary | ICD-10-CM | POA: Insufficient documentation

## 2016-11-28 DIAGNOSIS — R079 Chest pain, unspecified: Secondary | ICD-10-CM | POA: Diagnosis not present

## 2016-11-28 DIAGNOSIS — E559 Vitamin D deficiency, unspecified: Secondary | ICD-10-CM | POA: Insufficient documentation

## 2016-11-28 DIAGNOSIS — I110 Hypertensive heart disease with heart failure: Secondary | ICD-10-CM | POA: Insufficient documentation

## 2016-11-28 DIAGNOSIS — M1990 Unspecified osteoarthritis, unspecified site: Secondary | ICD-10-CM | POA: Diagnosis not present

## 2016-11-28 DIAGNOSIS — E7800 Pure hypercholesterolemia, unspecified: Secondary | ICD-10-CM | POA: Insufficient documentation

## 2016-11-28 DIAGNOSIS — Z7982 Long term (current) use of aspirin: Secondary | ICD-10-CM | POA: Diagnosis not present

## 2016-11-28 DIAGNOSIS — R0789 Other chest pain: Secondary | ICD-10-CM

## 2016-11-28 DIAGNOSIS — R001 Bradycardia, unspecified: Secondary | ICD-10-CM | POA: Diagnosis not present

## 2016-11-28 DIAGNOSIS — F0390 Unspecified dementia without behavioral disturbance: Secondary | ICD-10-CM | POA: Insufficient documentation

## 2016-11-28 DIAGNOSIS — Z87440 Personal history of urinary (tract) infections: Secondary | ICD-10-CM | POA: Diagnosis not present

## 2016-11-28 DIAGNOSIS — I25110 Atherosclerotic heart disease of native coronary artery with unstable angina pectoris: Principal | ICD-10-CM | POA: Insufficient documentation

## 2016-11-28 DIAGNOSIS — K219 Gastro-esophageal reflux disease without esophagitis: Secondary | ICD-10-CM | POA: Insufficient documentation

## 2016-11-28 DIAGNOSIS — M8580 Other specified disorders of bone density and structure, unspecified site: Secondary | ICD-10-CM | POA: Diagnosis not present

## 2016-11-28 DIAGNOSIS — I4510 Unspecified right bundle-branch block: Secondary | ICD-10-CM | POA: Insufficient documentation

## 2016-11-28 DIAGNOSIS — R0609 Other forms of dyspnea: Secondary | ICD-10-CM | POA: Diagnosis not present

## 2016-11-28 DIAGNOSIS — I10 Essential (primary) hypertension: Secondary | ICD-10-CM | POA: Diagnosis not present

## 2016-11-28 DIAGNOSIS — R778 Other specified abnormalities of plasma proteins: Secondary | ICD-10-CM

## 2016-11-28 DIAGNOSIS — K5909 Other constipation: Secondary | ICD-10-CM | POA: Insufficient documentation

## 2016-11-28 DIAGNOSIS — R7989 Other specified abnormal findings of blood chemistry: Secondary | ICD-10-CM | POA: Diagnosis not present

## 2016-11-28 DIAGNOSIS — R0600 Dyspnea, unspecified: Secondary | ICD-10-CM

## 2016-11-28 DIAGNOSIS — Z880 Allergy status to penicillin: Secondary | ICD-10-CM | POA: Insufficient documentation

## 2016-11-28 HISTORY — DX: Dyspnea, unspecified: R06.00

## 2016-11-28 LAB — CBC WITH DIFFERENTIAL/PLATELET
Basophils Absolute: 0 10*3/uL (ref 0.0–0.1)
Basophils Relative: 0 %
Eosinophils Absolute: 0.1 10*3/uL (ref 0.0–0.7)
Eosinophils Relative: 1 %
HCT: 43 % (ref 36.0–46.0)
HEMOGLOBIN: 13.7 g/dL (ref 12.0–15.0)
LYMPHS ABS: 2.8 10*3/uL (ref 0.7–4.0)
LYMPHS PCT: 34 %
MCH: 27.8 pg (ref 26.0–34.0)
MCHC: 31.9 g/dL (ref 30.0–36.0)
MCV: 87.2 fL (ref 78.0–100.0)
Monocytes Absolute: 0.8 10*3/uL (ref 0.1–1.0)
Monocytes Relative: 10 %
NEUTROS PCT: 55 %
Neutro Abs: 4.5 10*3/uL (ref 1.7–7.7)
Platelets: 195 10*3/uL (ref 150–400)
RBC: 4.93 MIL/uL (ref 3.87–5.11)
RDW: 13.2 % (ref 11.5–15.5)
WBC: 8.3 10*3/uL (ref 4.0–10.5)

## 2016-11-28 LAB — COMPREHENSIVE METABOLIC PANEL
ALT: 13 U/L — ABNORMAL LOW (ref 14–54)
AST: 19 U/L (ref 15–41)
Albumin: 4.2 g/dL (ref 3.5–5.0)
Alkaline Phosphatase: 90 U/L (ref 38–126)
Anion gap: 9 (ref 5–15)
BUN: 15 mg/dL (ref 6–20)
CHLORIDE: 107 mmol/L (ref 101–111)
CO2: 24 mmol/L (ref 22–32)
Calcium: 9.7 mg/dL (ref 8.9–10.3)
Creatinine, Ser: 0.95 mg/dL (ref 0.44–1.00)
GFR calc Af Amer: >60 mL/min (ref >60)
GFR, EST NON AFRICAN AMERICAN: 54 mL/min — AB (ref >60)
Glucose, Bld: 100 mg/dL — ABNORMAL HIGH (ref 65–99)
POTASSIUM: 3.7 mmol/L (ref 3.5–5.1)
SODIUM: 140 mmol/L (ref 135–145)
Total Bilirubin: 0.9 mg/dL (ref 0.3–1.2)
Total Protein: 7.2 g/dL (ref 6.5–8.1)

## 2016-11-28 LAB — TROPONIN I: Troponin I: 0.06 ng/mL (ref <0.03)

## 2016-11-28 LAB — I-STAT TROPONIN, ED: Troponin i, poc: 0.01 ng/mL (ref 0.00–0.08)

## 2016-11-28 LAB — BRAIN NATRIURETIC PEPTIDE: B Natriuretic Peptide: 325.4 pg/mL — ABNORMAL HIGH (ref 0.0–100.0)

## 2016-11-28 LAB — D-DIMER, QUANTITATIVE: D-Dimer, Quant: 0.41 ug/mL-FEU (ref 0.00–0.50)

## 2016-11-28 MED ORDER — VITAMIN D 1000 UNITS PO TABS
1000.0000 [IU] | ORAL_TABLET | Freq: Every day | ORAL | Status: DC
  Administered 2016-11-29 – 2016-11-30 (×2): 1000 [IU] via ORAL
  Filled 2016-11-28 (×2): qty 1

## 2016-11-28 MED ORDER — ZOLPIDEM TARTRATE 5 MG PO TABS
5.0000 mg | ORAL_TABLET | Freq: Every evening | ORAL | Status: AC | PRN
  Administered 2016-11-28: 5 mg via ORAL
  Filled 2016-11-28: qty 1

## 2016-11-28 MED ORDER — MORPHINE SULFATE (PF) 2 MG/ML IV SOLN: 1.0000 mg | INTRAVENOUS | Status: DC | PRN

## 2016-11-28 MED ORDER — BENZONATATE 100 MG PO CAPS: 100.0000 mg | ORAL_CAPSULE | Freq: Two times a day (BID) | ORAL | Status: DC | PRN

## 2016-11-28 MED ORDER — SODIUM CHLORIDE 0.9% FLUSH: 3.0000 mL | INTRAVENOUS | Status: DC | PRN

## 2016-11-28 MED ORDER — SODIUM CHLORIDE 0.9% FLUSH
3.0000 mL | Freq: Two times a day (BID) | INTRAVENOUS | Status: DC
  Administered 2016-11-29: 3 mL via INTRAVENOUS

## 2016-11-28 MED ORDER — SODIUM CHLORIDE 0.9 % WEIGHT BASED INFUSION
3.0000 mL/kg/h | INTRAVENOUS | Status: AC
  Administered 2016-11-29: 3 mL/kg/h via INTRAVENOUS

## 2016-11-28 MED ORDER — ENOXAPARIN SODIUM 40 MG/0.4ML ~~LOC~~ SOLN: 40.0000 mg | SUBCUTANEOUS | Status: DC

## 2016-11-28 MED ORDER — ASPIRIN EC 81 MG PO TBEC
81.0000 mg | DELAYED_RELEASE_TABLET | Freq: Every day | ORAL | Status: DC
  Administered 2016-11-29 – 2016-11-30 (×2): 81 mg via ORAL
  Filled 2016-11-28 (×2): qty 1

## 2016-11-28 MED ORDER — ONDANSETRON HCL 4 MG/2ML IJ SOLN: 4.0000 mg | Freq: Four times a day (QID) | INTRAMUSCULAR | Status: DC | PRN

## 2016-11-28 MED ORDER — HEPARIN (PORCINE) IN NACL 100-0.45 UNIT/ML-% IJ SOLN
800.0000 [IU]/h | INTRAMUSCULAR | Status: DC
  Administered 2016-11-28: 800 [IU]/h via INTRAVENOUS
  Filled 2016-11-28: qty 250

## 2016-11-28 MED ORDER — DILTIAZEM HCL ER COATED BEADS 120 MG PO CP24
120.0000 mg | ORAL_CAPSULE | Freq: Every day | ORAL | Status: DC
  Administered 2016-11-29 – 2016-11-30 (×2): 120 mg via ORAL
  Filled 2016-11-28 (×3): qty 1

## 2016-11-28 MED ORDER — SODIUM CHLORIDE 0.9 % WEIGHT BASED INFUSION
1.0000 mL/kg/h | INTRAVENOUS | Status: DC
  Administered 2016-11-29: 1 mL/kg/h via INTRAVENOUS

## 2016-11-28 MED ORDER — ASPIRIN 81 MG PO CHEW
243.0000 mg | CHEWABLE_TABLET | Freq: Once | ORAL | Status: AC
  Administered 2016-11-28: 243 mg via ORAL
  Filled 2016-11-28: qty 3

## 2016-11-28 MED ORDER — ACETAMINOPHEN 325 MG PO TABS: 650.0000 mg | ORAL_TABLET | ORAL | Status: DC | PRN

## 2016-11-28 MED ORDER — POLYETHYLENE GLYCOL 3350 17 G PO PACK: 17.0000 g | PACK | Freq: Every day | ORAL | Status: DC | PRN

## 2016-11-28 MED ORDER — HYDROCHLOROTHIAZIDE 25 MG PO TABS
25.0000 mg | ORAL_TABLET | Freq: Every day | ORAL | Status: DC
  Administered 2016-11-29 – 2016-11-30 (×2): 25 mg via ORAL
  Filled 2016-11-28 (×2): qty 1

## 2016-11-28 MED ORDER — POTASSIUM CHLORIDE ER 10 MEQ PO TBCR
10.0000 meq | EXTENDED_RELEASE_TABLET | Freq: Every day | ORAL | Status: DC
  Administered 2016-11-29 – 2016-11-30 (×2): 10 meq via ORAL
  Filled 2016-11-28 (×4): qty 1

## 2016-11-28 MED ORDER — VITAMIN D-3 25 MCG (1000 UT) PO CAPS: 1000.0000 [IU] | ORAL_CAPSULE | Freq: Every day | ORAL | Status: DC

## 2016-11-28 MED ORDER — SODIUM CHLORIDE 0.9 % IV SOLN: 250.0000 mL | INTRAVENOUS | Status: DC | PRN

## 2016-11-28 MED ORDER — HEPARIN BOLUS VIA INFUSION
3500.0000 [IU] | Freq: Once | INTRAVENOUS | Status: AC
  Administered 2016-11-28: 3500 [IU] via INTRAVENOUS
  Filled 2016-11-28: qty 3500

## 2016-11-28 MED ORDER — FAMOTIDINE 20 MG PO TABS: 20.0000 mg | ORAL_TABLET | Freq: Two times a day (BID) | ORAL | Status: DC | PRN

## 2016-11-28 NOTE — Progress Notes (Signed)
ANTICOAGULATION CONSULT NOTE - Initial Consult  Pharmacy Consult for Heparin  Indication: chest pain/ACS  Allergies  Allergen Reactions  . Amoxicillin     Reaction not known  . Cefpodoxime Proxetil     Reaction not known  . Clarithromycin     Reaction not known  . Furosemide     REACTION: doesn't help  . Gabapentin Swelling    Caused severe swellling    Patient Measurements: Height: 5\' 2"  (157.5 cm) Weight: 150 lb 9.6 oz (68.3 kg) IBW/kg (Calculated) : 50.1 Heparin Dosing Weight: 64.3 kg  Vital Signs: Temp: 98.5 F (36.9 C) (06/21 1800) Temp Source: Oral (06/21 1800) BP: 145/105 (06/21 1800) Pulse Rate: 60 (06/21 1800)  Labs:  Recent Labs  11/28/16 1352 11/28/16 1751  HGB 13.7  --   HCT 43.0  --   PLT 195  --   CREATININE 0.95  --   TROPONINI  --  0.06*    Estimated Creatinine Clearance: 41.4 mL/min (by C-G formula based on SCr of 0.95 mg/dL).   Medical History: Past Medical History:  Diagnosis Date  . Allergy   . Clinical trial participant    [facility] studies in familial dementia  . Dyspnea 11/28/2016  . Frequent UTI   . GERD (gastroesophageal reflux disease)   . Hypertension   . Osteoarthritis    hips, back  . Osteopenia   . Pneumonia   . Spinal stenosis   . Stroke [facility]) 1985    Assessment: 63 YOF admitted with chest discomfort and SOB, troponin mildly elevated, pharmacy is consulted to start IV heparin for ACS. She is not on anticoagulation PTA, baseline CBC wnl.  Goal of Therapy:  Heparin level 0.3-0.7 units/ml Monitor platelets by anticoagulation protocol: Yes   Plan:  Heparin bolus 3500 units x 1 Heparin infusion 800 units/hr F/u 8 hr heparin level at 0500 Daily heparin level and CBC  Maryanna Shape, PharmD, BCPS  Clinical Pharmacist  Pager: [phone]   11/28/2016,8:05 PM

## 2016-11-28 NOTE — Telephone Encounter (Signed)
Patient Name: Sara [name] ON DOB: 14-Jan-1932 Initial Comment Robin Nurse Assessment Nurse: Rock Nephew, RN, Juliann Pulse Date/Time (Eastern Time): 11/28/2016 12:42:45 PM Confirm and document reason for call. If symptomatic, describe symptoms. ---Caller stated that her Mother is having SOB , fatigue and just does not feel good . Does the patient have any new or worsening symptoms? ---Yes Will a triage be completed? ---Yes Related visit to physician within the last 2 weeks? ---No Does the PT have any chronic conditions? (i.e. diabetes, asthma, etc.) ---Yes List chronic conditions. ---HTN Is this a behavioral health or substance abuse call? ---No Guidelines Guideline Title Affirmed Question Affirmed Notes Chest Pain Difficulty breathing Final Disposition User Go to ED Now Rock Nephew, RN, [facility] - ED

## 2016-11-28 NOTE — ED Triage Notes (Addendum)
Pt reports chest tightness and SOB with exertion been occurring for "long time". Cannot give time. Does improve with resting. States she sometimes gets nauseated when it occurs. Denies chest tightness right now but reports "hard to breathe". Breathing is regular at this time and unlabored. No extremity swelling at this time.

## 2016-11-28 NOTE — Progress Notes (Signed)
CRITICAL VALUE ALERT  Critical Value:Troponin 0.06  Date & Time Notied:  11/28/2016 @ 1920  Provider Notified: Dr Marlou Porch  Orders Received/Actions taken: Heparin gtt

## 2016-11-28 NOTE — Progress Notes (Signed)
    Trop mildly elevated 0.06.  Will start Heparin IV  Candee Furbish, MD

## 2016-11-28 NOTE — ED Provider Notes (Signed)
[facility] DEPT Provider Note   CSN: [id number] Arrival date & time: 11/28/16  1341     History   Chief Complaint Chief Complaint  Patient presents with  . Shortness of Breath    HPI Sara [name] is a 83 y.o. female.Complains of a tightness in her chest radiating to the back onset several months ago becoming worse over the past few days symptoms worse with walking improved with rest. She also complains of mild right lateral lower leg pain but over the knee onset approximate 5 days ago after taking a car trip to [place]. Presently asymptomatic except for mild left leg pain. No other associated symptoms. No treatment prior to coming here.  HPI  Past Medical History:  Diagnosis Date  . Allergy   . Clinical trial participant    [facility] studies in familial dementia  . Frequent UTI   . GERD (gastroesophageal reflux disease)   . Hypertension   . Osteoarthritis    hips, back  . Osteopenia   . Pneumonia   . Spinal stenosis   . Stroke [facility]) 1985    Patient Active Problem List   Diagnosis Date Noted  . Intertrigo 03/15/2016  . Dyspepsia 03/15/2016  . Bilateral impacted cerumen 03/15/2016  . 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 Pain of left thumb 07/24/2015  . Abdominal pain 07/24/2015  . Abnormal urine odor 03/06/2015  . Eczema 03/06/2015  . Caregiver stress 07/22/2014  . Left foot pain 04/15/2014  . Rash and nonspecific skin eruption 04/15/2014  . Vertigo, benign paroxysmal 02/11/2014  . Hyperglycemia 02/11/2014  . Arm swelling 12/07/2012  . Intertrigo labialis 12/07/2012  . Frequent urination 09/04/2012  . Ingrown hair 09/04/2012  . Breast pain 02/08/2011  . Hip pain, left 02/08/2011  . CONSTIPATION, CHRONIC 07/03/2010  . Vitamin D deficiency 09/22/2008  . OSTEOPENIA 06/24/2008  . HYPERCHOLESTEROLEMIA 12/08/2006  . Essential hypertension 12/08/2006  . GERD 12/08/2006  . SPINAL STENOSIS 12/08/2006    Past Surgical History:  Procedure  Laterality Date  . ABDOMINAL HYSTERECTOMY     partial has one ovary  . APPENDECTOMY    . CATARACT EXTRACTION Bilateral   . LUMBAR DISC SURGERY     x 3  . rotator cuff surgery Right 2010    OB History    No data available       Home Medications    Prior to Admission medications   Medication Sig Start Date End Date Taking? Authorizing Provider  aspirin EC 81 MG tablet Take 81 mg by mouth daily.      [provider]  benzonatate (TESSALON) 100 MG capsule Take 1 capsule (100 mg total) by mouth 2 (two) times daily as needed for cough. 04/30/16   Jearld Fenton, NP  Cholecalciferol (VITAMIN D-3) 1000 UNITS CAPS Take 1 tablet by mouth daily.    [provider]  clotrimazole-betamethasone (LOTRISONE) cream Apply 1 application topically 2 (two) times daily. To affected areas 03/15/16   Tower, Wynelle Fanny, MD  diltiazem [facility]) 120 MG 24 hr capsule TAKE 1 CAPSULE EVERY DAY 10/01/16   Tower, Wynelle Fanny, MD  Docusate Sodium (EQ STOOL SOFTENER PO) Take 100 mg by mouth daily.    [provider]  doxylamine, Sleep, (EQ SLEEP AID) 25 MG tablet Take 25 mg by mouth at bedtime as needed.    [provider]  hydrochlorothiazide (HYDRODIURIL) 25 MG tablet TAKE 1 TABLET EVERY DAY 10/01/16   Tower, Wynelle Fanny, MD  mometasone (ELOCON) 0.1 %  cream Apply 1 application topically daily. To affected areas 03/06/15   Tower, Wynelle Fanny, MD  polyethylene glycol (MIRALAX / GLYCOLAX) packet Take 17 g by mouth as needed.     [provider]  potassium chloride (K-DUR) 10 MEQ tablet TAKE 1 TABLET EVERY DAY 10/01/16   Tower, Wynelle Fanny, MD  ranitidine (ZANTAC) 150 MG capsule Take 1 capsule (150 mg total) by mouth 2 (two) times daily. 03/15/16   Tower, Wynelle Fanny, MD  tolterodine (DETROL) 2 MG tablet Take 2 mg by mouth 2 (two) times daily.    [provider]    Family History Family History  Problem Relation Age of Onset  . Lung cancer Brother   . Other Mother        kidney tumor  .  Hypertension Mother   . Diabetes Sister   . Diabetes Brother   . Diabetes Sister        x 2  . Colon cancer Maternal Uncle   . Other Sister        intestine burst  . Breast cancer Maternal Aunt        x 2  . Breast cancer Daughter   . Diabetes Son        x 3  . Diabetes Daughter   . Esophageal cancer Neg Hx   . Rectal cancer Neg Hx   . Stomach cancer Neg Hx     Social History Social History  Substance Use Topics  . Smoking status: Never Smoker  . Smokeless tobacco: Never Used  . Alcohol use No     Allergies   Amoxicillin; Aspirin; Cefpodoxime proxetil; Clarithromycin; Furosemide; and Gabapentin   Review of Systems Review of Systems  Constitutional: Negative.   HENT: Negative.   Respiratory: Positive for shortness of breath.   Cardiovascular: Positive for chest pain.  Gastrointestinal: Negative.   Musculoskeletal: Positive for myalgias.  Skin: Negative.   Neurological: Negative.   Psychiatric/Behavioral: Negative.   All other systems reviewed and are negative.    Physical Exam Updated Vital Signs BP (!) 147/87 (BP Location: Left Arm)   Pulse (!) 53   Temp 97.9 F (36.6 C) (Oral)   Resp 15   SpO2 95%   Physical Exam  Constitutional: She appears well-developed and well-nourished.  HENT:  Head: Normocephalic and atraumatic.  Eyes: Conjunctivae are normal. Pupils are equal, round, and reactive to light.  Neck: Neck supple. No tracheal deviation present. No thyromegaly present.  Cardiovascular: Normal rate and regular rhythm.   No murmur heard. Pulmonary/Chest: Effort normal and breath sounds normal.  Abdominal: Soft. Bowel sounds are normal. She exhibits no distension. There is no tenderness.  Musculoskeletal: Normal range of motion. She exhibits no edema or tenderness.  Neurological: She is alert. Coordination normal.  Skin: Skin is warm and dry. No rash noted.  Psychiatric: She has a normal mood and affect.  Nursing note and vitals  reviewed.    ED Treatments / Results  Labs (all labs ordered are listed, but only abnormal results are displayed) Labs Reviewed  COMPREHENSIVE METABOLIC PANEL - Abnormal; Notable for the following:       Result Value   Glucose, Bld 100 (*)    ALT 13 (*)    GFR calc non Af Amer 54 (*)    All other components within normal limits  CBC WITH DIFFERENTIAL/PLATELET  D-DIMER, QUANTITATIVE (NOT AT [facility])  Randolm Idol, ED    EKG  EKG Interpretation  Date/Time:  Thursday November 28 2016 13:45:17 EDT Ventricular Rate:  66 PR Interval:  182 QRS Duration: 96 QT Interval:  400 QTC Calculation: 419 R Axis:   -13 Text Interpretation:  Sinus rhythm with occasional Premature ventricular complexes Incomplete right bundle branch block Septal infarct , age undetermined Abnormal ECG No significant change since last tracing Confirmed by Orlie Dakin [phone]) on 11/28/2016 2:31:03 PM      Chest x-ray viewed by me Radiology Dg Chest 2 View  Result Date: 11/28/2016 CLINICAL DATA:  Chest tightness EXAM: CHEST  2 VIEW COMPARISON:  10/25/2008 FINDINGS: Heart size and vascularity normal. Atherosclerotic aorta. Lungs are clear without infiltrate or effusion. IMPRESSION: No active cardiopulmonary disease. Electronically Signed   By: Franchot Gallo M.D.   On: 11/28/2016 14:28    Procedures Procedures (including critical care time)  Medications Ordered in ED Medications  aspirin chewable tablet 243 mg (not administered)  Chest x-ray viewed by me Results for orders placed or performed during the hospital encounter of 11/28/16  Comprehensive metabolic panel  Result Value Ref Range   Sodium 140 135 - 145 mmol/L   Potassium 3.7 3.5 - 5.1 mmol/L   Chloride 107 101 - 111 mmol/L   CO2 24 22 - 32 mmol/L   Glucose, Bld 100 (H) 65 - 99 mg/dL   BUN 15 6 - 20 mg/dL   Creatinine, Ser 0.95 0.44 - 1.00 mg/dL   Calcium 9.7 8.9 - 10.3 mg/dL   Total Protein 7.2 6.5 - 8.1 g/dL   Albumin 4.2 3.5 - 5.0 g/dL    AST 19 15 - 41 U/L   ALT 13 (L) 14 - 54 U/L   Alkaline Phosphatase 90 38 - 126 U/L   Total Bilirubin 0.9 0.3 - 1.2 mg/dL   GFR calc non Af Amer 54 (L) >60 mL/min   GFR calc Af Amer >60 >60 mL/min   Anion gap 9 5 - 15  CBC with Differential  Result Value Ref Range   WBC 8.3 4.0 - 10.5 K/uL   RBC 4.93 3.87 - 5.11 MIL/uL   Hemoglobin 13.7 12.0 - 15.0 g/dL   HCT 43.0 36.0 - 46.0 %   MCV 87.2 78.0 - 100.0 fL   MCH 27.8 26.0 - 34.0 pg   MCHC 31.9 30.0 - 36.0 g/dL   RDW 13.2 11.5 - 15.5 %   Platelets 195 150 - 400 K/uL   Neutrophils Relative % 55 %   Neutro Abs 4.5 1.7 - 7.7 K/uL   Lymphocytes Relative 34 %   Lymphs Abs 2.8 0.7 - 4.0 K/uL   Monocytes Relative 10 %   Monocytes Absolute 0.8 0.1 - 1.0 K/uL   Eosinophils Relative 1 %   Eosinophils Absolute 0.1 0.0 - 0.7 K/uL   Basophils Relative 0 %   Basophils Absolute 0.0 0.0 - 0.1 K/uL  D-dimer, quantitative (not at [facility])  Result Value Ref Range   D-Dimer, Quant 0.41 0.00 - 0.50 ug/mL-FEU  I-stat troponin, ED  Result Value Ref Range   Troponin i, poc 0.01 0.00 - 0.08 ng/mL   Comment 3           Dg Chest 2 View  Result Date: 11/28/2016 CLINICAL DATA:  Chest tightness EXAM: CHEST  2 VIEW COMPARISON:  10/25/2008 FINDINGS: Heart size and vascularity normal. Atherosclerotic aorta. Lungs are clear without infiltrate or effusion. IMPRESSION: No active cardiopulmonary disease. Electronically Signed   By: Franchot Gallo M.D.   On: 11/28/2016 14:28   Initial Impression / Assessment and Plan /  ED Course  I have reviewed the triage vital signs and the nursing notes.  Pertinent labs & imaging results that were available during my care of the patient were reviewed by me and considered in my medical decision making (see chart for details).    Heart score equals 5 4:20 PM patient resting comfortably. Hospitalist service consulted and will arrange for overnight stay  Final Clinical Impressions(s) / ED Diagnoses  Diagnosis #1 chest pain with  exertion  Final diagnoses:  None    New Prescriptions New Prescriptions   No medications on file     Orlie Dakin, MD 11/28/16 1627

## 2016-11-28 NOTE — Consult Note (Signed)
Cardiology Consultation:   Patient ID: Sara [name]; [id number]; 1931/08/08   Admit date: 11/28/2016 Date of Consult: 11/28/2016  Primary Care Provider: Abner Greenspan, MD Primary Cardiologist: Marlou Porch, new Primary Electrophysiologist:  none   Patient Profile:   Sara [name] is a 83 y.o. female with a hx of Hypertension, GERD, nonsmoker who is being seen today for the evaluation of dyspnea on exertion concerning for angina at the request of Dr. Tana Coast.  History of Present Illness:   [name] is an 83 year old female here with worsening dyspnea on exertion over the past 2-3 months. For instance when walking from the kitchen to the bathroom or bedroom, sometimes she will feel short of breath. She may have some chest heaviness as well associated with this. No orthopnea, no PND. Sometimes she may feel some nausea associated with her symptoms. She has had no prior myocardial infarction, no need for prior cardiac workup. She tells me that her mother lived to age 41.    Her d-dimer was negative in the emergency room, troponin was normal, EKG shows no acute ST segment changes, PVC noted. Chest x-ray is unremarkable.  Past Medical History:  Diagnosis Date  . Allergy   . Clinical trial participant    [facility] studies in familial dementia  . Dyspnea 11/28/2016  . Frequent UTI   . GERD (gastroesophageal reflux disease)   . Hypertension   . Osteoarthritis    hips, back  . Osteopenia   . Pneumonia   . Spinal stenosis   . Stroke [facility]) 1985    Past Surgical History:  Procedure Laterality Date  . ABDOMINAL HYSTERECTOMY     partial has one ovary  . APPENDECTOMY    . CATARACT EXTRACTION Bilateral   . LUMBAR DISC SURGERY     x 3  . rotator cuff surgery Right 2010     Inpatient Medications: Scheduled Meds: . aspirin EC  81 mg Oral Daily  . diltiazem  120 mg Oral Daily  . enoxaparin (LOVENOX) injection  40 mg Subcutaneous Q24H  . famotidine  20 mg  Oral BID  . hydrochlorothiazide  25 mg Oral Daily  . potassium chloride  10 mEq Oral Daily  . Vitamin D-3  1,000 Units Oral Daily   Continuous Infusions:  PRN Meds: acetaminophen, benzonatate, morphine injection, ondansetron (ZOFRAN) IV, polyethylene glycol  Allergies:    Allergies  Allergen Reactions  . Amoxicillin     Reaction not known  . Aspirin     GERD  . Cefpodoxime Proxetil     Reaction not known  . Clarithromycin     Reaction not known  . Furosemide     REACTION: doesn't help  . Gabapentin Swelling    Caused severe swellling    Social History:   Social History   Social History  . Marital status: Married    Spouse name: N/A  . Number of children: 6  . Years of education: N/A   Occupational History  . RETIRED Retired   Social History Main Topics  . Smoking status: Never Smoker  . Smokeless tobacco: Never Used  . Alcohol use No  . Drug use: No  . Sexual activity: Not on file   Other Topics Concern  . Not on file   Social History Narrative  . No narrative on file    Family History:   The patient's family history includes Breast cancer in her daughter and maternal aunt; Colon cancer in her maternal uncle;  Diabetes in her brother, daughter, sister, sister, and son; Hypertension in her mother; Lung cancer in her brother; Other in her mother and sister. There is no history of Esophageal cancer, Rectal cancer, or Stomach cancer.  ROS:  Please see the history of present illness.  ROS  No bleeding, no syncope, no orthopnea, no PND All other ROS reviewed and negative.     Physical Exam/Data:   Vitals:   11/28/16 1510 11/28/16 1545 11/28/16 1630 11/28/16 1715  BP: 128/73  (!) 147/85 140/82  Pulse: 60 (!) 51 (!) 48 (!) 55  Resp: 16 17 17 14   Temp:      TempSrc:      SpO2: 99% 98% 96% 93%   No intake or output data in the 24 hours ending 11/28/16 1756 There were no vitals filed for this visit. There is no height or weight on file to calculate BMI.    General:  Well nourished, well developed, in no acute distress, looks younger than stated age 21: normal Lymph: no adenopathy Neck: no JVD Endocrine:  No thryomegaly Vascular: No carotid bruits; FA pulses 2+ bilaterally without bruits  Cardiac:  normal S1, S2; RRR; no murmur  Lungs:  clear to auscultation bilaterally, no wheezing, rhonchi or rales  Abd: soft, nontender, no hepatomegaly  Ext: no edema Musculoskeletal:  No deformities, BUE and BLE strength normal and equal Skin: warm and dry  Neuro:  CNs 2-12 intact, no focal abnormalities noted Psych:  Normal affect   EKG:  The EKG was personally reviewed and demonstrates:  Sinus rhythm, nonspecific ST-T wave changes, PVC noted  Telemetry:  Telemetry was personally reviewed and demonstrates:  She is demonstrating occasional ventricular bigeminy with effective pulse in the 40s. This is transient.  Relevant CV Studies: Echo pending  Laboratory Data:  Chemistry Recent Labs Lab 11/28/16 1352  NA 140  K 3.7  CL 107  CO2 24  GLUCOSE 100*  BUN 15  CREATININE 0.95  CALCIUM 9.7  GFRNONAA 54*  GFRAA >60  ANIONGAP 9     Recent Labs Lab 11/28/16 1352  PROT 7.2  ALBUMIN 4.2  AST 19  ALT 13*  ALKPHOS 90  BILITOT 0.9   Hematology Recent Labs Lab 11/28/16 1352  WBC 8.3  RBC 4.93  HGB 13.7  HCT 43.0  MCV 87.2  MCH 27.8  MCHC 31.9  RDW 13.2  PLT 195   Cardiac EnzymesNo results for input(s): TROPONINI in the last 168 hours.  Recent Labs Lab 11/28/16 1355  TROPIPOC 0.01    BNPNo results for input(s): BNP, PROBNP in the last 168 hours.  DDimer  Recent Labs Lab 11/28/16 1352  DDIMER 0.41    Radiology/Studies:  Dg Chest 2 View  Result Date: 11/28/2016 CLINICAL DATA:  Chest tightness EXAM: CHEST  2 VIEW COMPARISON:  10/25/2008 FINDINGS: Heart size and vascularity normal. Atherosclerotic aorta. Lungs are clear without infiltrate or effusion. IMPRESSION: No active cardiopulmonary disease. Electronically  Signed   By: Franchot Gallo M.D.   On: 11/28/2016 14:28    Assessment and Plan:   Dyspnea on exertion  - This may very well be an anginal equivalent. I'm concerned about the progression of her symptoms over the past 2 months or so.  - With her age, she has a high pretest probability of coronary artery disease and it would not be unreasonable to proceed with cardiac catheterization. Risks versus benefits including stroke, heart attack, renal impairment discussed. She is willing to proceed. Nothing by mouth past  midnight.  - Continue with serial troponins. Echocardiogram will be helpful to make sure that she does not have any evidence of cardiomyopathy.  - BNP is currently pending.  - Interestingly, she does demonstrate ventricular bigeminy with an effective pulse rate 40s which may be inhibiting some of her exertional activity. Nonetheless, I think a coronary workup is warranted.  Hyperlipidemia  - She is obtaining a lipid panel.  Bradycardia  - Sometimes in the 40s with bigeminy. Asymptomatic. She is taking Cardizem.    Signed, Candee Furbish, MD  11/28/2016 5:56 PM

## 2016-11-28 NOTE — H&P (Signed)
History and Physical        Hospital Admission Note Date: 11/28/2016  Patient name: Sara [name] Medical record number: [id number] Date of birth: 1931/06/18 Age: 83 y.o. Gender: female  PCP: Tower, Wynelle Fanny, MD    Patient coming from: home   I have reviewed all records in the [facility].    Chief Complaint:  Chest heaviness with dyspnea on exertion worsening for last 2 months   HPI: Patient is a 83 year old female with history of hypertension, osteoarthritis, GERD presented to ED with shortness of breath with exertion worsening in the last 2 months. Patient also reported chest heaviness with dyspnea. Patient reports that she has noticed it has been worsening in the last 2 months to the point that she feels winded while going from her bedroom to the kitchen. She denies any weight gain, any orthopnea, PND or any swelling in her legs or ankles. She does notice chest heaviness with shortness of breath and the symptoms improved with resting. Occasionally she gets nauseated when her symptoms occur. She also has occasional coughing but no fevers or chills. No prior cardiac workup or prior history of MI. No wheezing Patient had a recent car travel to [place] last week. Feels that her symptoms were little worse after the car travel.   ED work-up/course:  Temp 97.9 and respiratory rate 22, pulse 63, BP 147/87, O2 sats 100% on room air BMET, CBC unremarkable, troponin POC 0.01, d-dimer negative/0.41 EKG rate 66, normal sinus rhythm, incomplete right bundle branch block, no acute ST-T wave changes suggestive of ischemia  Review of Systems: Positives marked in 'bold' Constitutional: Denies fever, chills, diaphoresis, poor appetite and fatigue.  HEENT: Denies photophobia, eye pain, redness, hearing loss, ear pain, congestion, sore throat, rhinorrhea, sneezing, mouth sores,  trouble swallowing, neck pain, neck stiffness and tinnitus.   Respiratory: Please see history of present illness  Cardiovascular: Denies palpitations and leg swelling.  Gastrointestinal: Denies nausea, vomiting, abdominal pain, diarrhea, constipation, blood in stool and abdominal distention.  Genitourinary: Denies dysuria, urgency, frequency, hematuria, flank pain and difficulty urinating.  Musculoskeletal: Denies myalgias, back pain, joint swelling, arthralgias and gait problem.  Skin: Denies pallor, rash and wound.  Neurological: Denies dizziness, seizures, syncope, weakness, light-headedness, numbness and headaches.  Hematological: Denies adenopathy. Easy bruising, personal or family bleeding history  Psychiatric/Behavioral: Denies suicidal ideation, mood changes, confusion, nervousness, sleep disturbance and agitation  Past Medical History: Past Medical History:  Diagnosis Date  . Allergy   . Clinical trial participant    [facility] studies in familial dementia  . Frequent UTI   . GERD (gastroesophageal reflux disease)   . Hypertension   . Osteoarthritis    hips, back  . Osteopenia   . Pneumonia   . Spinal stenosis   . Stroke [facility]) 1985    Past Surgical History:  Procedure Laterality Date  . ABDOMINAL HYSTERECTOMY     partial has one ovary  . APPENDECTOMY    . CATARACT EXTRACTION Bilateral   . LUMBAR DISC SURGERY     x 3  . rotator cuff surgery Right 2010    Medications: Prior to Admission medications  Medication Sig Start Date End Date Taking? Authorizing Provider  aspirin EC 81 MG tablet Take 81 mg by mouth daily.      [provider]  benzonatate (TESSALON) 100 MG capsule Take 1 capsule (100 mg total) by mouth 2 (two) times daily as needed for cough. 04/30/16   Jearld Fenton, NP  Cholecalciferol (VITAMIN D-3) 1000 UNITS CAPS Take 1 tablet by mouth daily.    [provider]  clotrimazole-betamethasone (LOTRISONE) cream Apply 1  application topically 2 (two) times daily. To affected areas 03/15/16   Tower, Wynelle Fanny, MD  diltiazem [facility]) 120 MG 24 hr capsule TAKE 1 CAPSULE EVERY DAY 10/01/16   Tower, Wynelle Fanny, MD  Docusate Sodium (EQ STOOL SOFTENER PO) Take 100 mg by mouth daily.    [provider]  doxylamine, Sleep, (EQ SLEEP AID) 25 MG tablet Take 25 mg by mouth at bedtime as needed.    [provider]  hydrochlorothiazide (HYDRODIURIL) 25 MG tablet TAKE 1 TABLET EVERY DAY 10/01/16   Tower, Marne A, MD  mometasone (ELOCON) 0.1 % cream Apply 1 application topically daily. To affected areas 03/06/15   Tower, Wynelle Fanny, MD  polyethylene glycol (MIRALAX / GLYCOLAX) packet Take 17 g by mouth as needed.     [provider]  potassium chloride (K-DUR) 10 MEQ tablet TAKE 1 TABLET EVERY DAY 10/01/16   Tower, Wynelle Fanny, MD  ranitidine (ZANTAC) 150 MG capsule Take 1 capsule (150 mg total) by mouth 2 (two) times daily. 03/15/16   Tower, Wynelle Fanny, MD  tolterodine (DETROL) 2 MG tablet Take 2 mg by mouth 2 (two) times daily.    [provider]    Allergies:   Allergies  Allergen Reactions  . Amoxicillin     REACTION: reaction not known  . Aspirin     REACTION: GERD  . Cefpodoxime Proxetil     REACTION: reaction not known  . Clarithromycin     REACTION: reaction not known  . Furosemide     REACTION: doesn't help  . Gabapentin     REACTION: caused severe swellling    Social History: She reports that she has never smoked. She has never used smokeless tobacco. She reports that she does not drink alcohol or use drugs.  Family History: Family History  Problem Relation Age of Onset  . Lung cancer Brother   . Other Mother        kidney tumor  . Hypertension Mother   . Diabetes Sister   . Diabetes Brother   . Diabetes Sister        x 2  . Colon cancer Maternal Uncle   . Other Sister        intestine burst  . Breast cancer Maternal Aunt        x 2  . Breast cancer Daughter   . Diabetes Son         x 3  . Diabetes Daughter   . Esophageal cancer Neg Hx   . Rectal cancer Neg Hx   . Stomach cancer Neg Hx     Physical Exam: Blood pressure (!) 147/85, pulse (!) 48, temperature 97.9 F (36.6 C), temperature source Oral, resp. rate 17, SpO2 96 %. General: Alert, awake, oriented x3, in no acute distress. Eyes: pink conjunctiva,anicteric sclera, pupils equal and reactive to light and accomodation, HEENT: normocephalic, atraumatic, oropharynx clear Neck: supple, no masses or lymphadenopathy, no goiter, no bruits, no JVD CVS: Regular rate and rhythm, without  murmurs, rubs or gallops. No lower extremity edema Resp : Clear to auscultation bilaterally, no wheezing, rales or rhonchi. GI : Soft, nontender, nondistended, positive bowel sounds, no masses. No hepatomegaly. No hernia.  Musculoskeletal: No clubbing or cyanosis, positive pedal pulses. No contracture. ROM intact  Neuro: Grossly intact, no focal neurological deficits, strength 5/5 upper and lower extremities bilaterally Psych: alert and oriented x 3, normal mood and affect Skin: no rashes or lesions, warm and dry   LABS on Admission: I have personally reviewed all the labs and imagings below    Basic Metabolic Panel:  Recent Labs Lab 11/28/16 1352  NA 140  K 3.7  CL 107  CO2 24  GLUCOSE 100*  BUN 15  CREATININE 0.95  CALCIUM 9.7   Liver Function Tests:  Recent Labs Lab 11/28/16 1352  AST 19  ALT 13*  ALKPHOS 90  BILITOT 0.9  PROT 7.2  ALBUMIN 4.2   No results for input(s): LIPASE, AMYLASE in the last 168 hours. No results for input(s): AMMONIA in the last 168 hours. CBC:  Recent Labs Lab 11/28/16 1352  WBC 8.3  NEUTROABS 4.5  HGB 13.7  HCT 43.0  MCV 87.2  PLT 195   Cardiac Enzymes: No results for input(s): CKTOTAL, CKMB, CKMBINDEX, TROPONINI in the last 168 hours. BNP: Invalid input(s): POCBNP CBG: No results for input(s): GLUCAP in the last 168 hours.  Radiological Exams on Admission:    Dg Chest 2 View  Result Date: 11/28/2016 CLINICAL DATA:  Chest tightness EXAM: CHEST  2 VIEW COMPARISON:  10/25/2008 FINDINGS: Heart size and vascularity normal. Atherosclerotic aorta. Lungs are clear without infiltrate or effusion. IMPRESSION: No active cardiopulmonary disease. Electronically Signed   By: Franchot Gallo M.D.   On: 11/28/2016 14:28      EKG: Independently reviewed. EKG rate 66, normal sinus rhythm, incomplete right bundle branch block, no acute ST-T wave changes suggestive of ischemia   Assessment/Plan Principal Problem:   Dyspnea on exertion with chest heaviness: Appears anginal-equivalent with patient's history - No wheezing, BNP pending, however no orthopnea, PND or symptoms of fluid overload. D-dimer negative hence PE ruled out, no anemia. No prior cardiac workup, chest x-ray clear - Troponin 1 negative, obtain Serial troponins, BNP  - Obtain 2-D echo, discussed with cardiology, Dr Marlou Porch, will evaluate patient for further recommendations regarding stress test versus cardiac cath - Continue aspirin, obtain lipid panel  Active Problems:   HYPERCHOLESTEROLEMIA - Obtain lipid panel    Essential hypertension - Continue HCTZ, diltiazem  Sinus bradycardia - Likely due to Cardizem, asymptomatic    GERD - Continue Pepcid   DVT prophylaxis: Lovenox   CODE STATUS: Full CODE STATUS   Consults called: Cardiology, Dr. Marlou Porch   Family Communication: Admission, patients condition and plan of care including tests being ordered have been discussed with the patient, 2 daughters, 1 son at the bedside who indicates understanding and agree with the plan and Code Status  Admission status: Observation, telemetry   Disposition plan: Further plan will depend as patient's clinical course evolves and further radiologic and laboratory data become available.    At the time of admission, it appears that the appropriate admission status for this patient is observation  . This  is judged to be reasonable and necessary in order to provide the required intensity of service to ensure the patient's safety given the presenting symptoms dyspnea on exertion with chest tightness to rule out cardiac ischemia, physical exam findings, and initial radiographic and laboratory data  in the context of their chronic comorbidities.     Time Spent on Admission: 22mins     Troi Florendo M.D. [facility] 11/28/2016, 5:15 PM Pager: [phone]  If 7PM-7AM, please contact night-coverage [email or web address] Password [email or web address]

## 2016-11-28 NOTE — Telephone Encounter (Signed)
Per chart review pt went to [facility] ED

## 2016-11-29 ENCOUNTER — Encounter (HOSPITAL_COMMUNITY): Payer: Self-pay | Admitting: Cardiovascular Disease

## 2016-11-29 ENCOUNTER — Encounter (HOSPITAL_COMMUNITY)
Admission: EM | Disposition: A | Discharge status: Home / Self Care | Payer: Self-pay | Source: Home / Self Care | Attending: Emergency Medicine

## 2016-11-29 ENCOUNTER — Observation Stay (HOSPITAL_BASED_OUTPATIENT_CLINIC_OR_DEPARTMENT_OTHER): Payer: Medicare HMO

## 2016-11-29 DIAGNOSIS — I2510 Atherosclerotic heart disease of native coronary artery without angina pectoris: Secondary | ICD-10-CM | POA: Diagnosis not present

## 2016-11-29 DIAGNOSIS — R079 Chest pain, unspecified: Secondary | ICD-10-CM | POA: Diagnosis not present

## 2016-11-29 DIAGNOSIS — E7800 Pure hypercholesterolemia, unspecified: Secondary | ICD-10-CM

## 2016-11-29 DIAGNOSIS — I360 Nonrheumatic tricuspid (valve) stenosis: Secondary | ICD-10-CM | POA: Diagnosis not present

## 2016-11-29 DIAGNOSIS — R7989 Other specified abnormal findings of blood chemistry: Secondary | ICD-10-CM

## 2016-11-29 DIAGNOSIS — I10 Essential (primary) hypertension: Secondary | ICD-10-CM | POA: Diagnosis not present

## 2016-11-29 DIAGNOSIS — R748 Abnormal levels of other serum enzymes: Secondary | ICD-10-CM | POA: Diagnosis not present

## 2016-11-29 DIAGNOSIS — R778 Other specified abnormalities of plasma proteins: Secondary | ICD-10-CM

## 2016-11-29 DIAGNOSIS — R0609 Other forms of dyspnea: Secondary | ICD-10-CM | POA: Diagnosis not present

## 2016-11-29 HISTORY — PX: LEFT HEART CATH AND CORONARY ANGIOGRAPHY: CATH118249

## 2016-11-29 LAB — LIPID PANEL
Cholesterol: 187 mg/dL (ref 0–200)
HDL: 53 mg/dL (ref >40)
LDL CALC: 122 mg/dL — AB (ref 0–99)
Total CHOL/HDL Ratio: 3.5 RATIO
Triglycerides: 60 mg/dL (ref <150)
VLDL: 12 mg/dL (ref 0–40)

## 2016-11-29 LAB — BASIC METABOLIC PANEL
Anion gap: 8 (ref 5–15)
BUN: 13 mg/dL (ref 6–20)
CHLORIDE: 106 mmol/L (ref 101–111)
CO2: 26 mmol/L (ref 22–32)
CREATININE: 0.82 mg/dL (ref 0.44–1.00)
Calcium: 9.2 mg/dL (ref 8.9–10.3)
GFR calc non Af Amer: >60 mL/min (ref >60)
Glucose, Bld: 101 mg/dL — ABNORMAL HIGH (ref 65–99)
Potassium: 3.7 mmol/L (ref 3.5–5.1)
Sodium: 140 mmol/L (ref 135–145)

## 2016-11-29 LAB — ECHOCARDIOGRAM COMPLETE
HEIGHTINCHES: 62 in
Weight: 2400 oz

## 2016-11-29 LAB — HEPARIN LEVEL (UNFRACTIONATED): HEPARIN UNFRACTIONATED: 0.57 [IU]/mL (ref 0.30–0.70)

## 2016-11-29 LAB — PROTIME-INR
INR: 1.08
Prothrombin Time: 14 s (ref 11.4–15.2)

## 2016-11-29 LAB — TROPONIN I
Troponin I: 0.08 ng/mL (ref <0.03)
Troponin I: 0.08 ng/mL (ref <0.03)

## 2016-11-29 SURGERY — LEFT HEART CATH AND CORONARY ANGIOGRAPHY
Anesthesia: LOCAL

## 2016-11-29 MED ORDER — HEPARIN (PORCINE) IN NACL 2-0.9 UNIT/ML-% IJ SOLN
INTRAMUSCULAR | Status: AC
  Filled 2016-11-29: qty 1000

## 2016-11-29 MED ORDER — FENTANYL CITRATE (PF) 100 MCG/2ML IJ SOLN
INTRAMUSCULAR | Status: DC | PRN
  Administered 2016-11-29: 25 ug via INTRAVENOUS

## 2016-11-29 MED ORDER — MIDAZOLAM HCL 2 MG/2ML IJ SOLN
INTRAMUSCULAR | Status: DC | PRN
  Administered 2016-11-29: 1 mg via INTRAVENOUS

## 2016-11-29 MED ORDER — HEPARIN SODIUM (PORCINE) 1000 UNIT/ML IJ SOLN
INTRAMUSCULAR | Status: DC | PRN
  Administered 2016-11-29: 4000 [IU] via INTRAVENOUS

## 2016-11-29 MED ORDER — IOPAMIDOL (ISOVUE-370) INJECTION 76%
INTRAVENOUS | Status: DC | PRN
  Administered 2016-11-29: 80 mL via INTRA_ARTERIAL

## 2016-11-29 MED ORDER — HEPARIN (PORCINE) IN NACL 2-0.9 UNIT/ML-% IJ SOLN
INTRAMUSCULAR | Status: AC | PRN
  Administered 2016-11-29: 1000 mL

## 2016-11-29 MED ORDER — LIDOCAINE HCL (PF) 1 % IJ SOLN
INTRAMUSCULAR | Status: AC
  Filled 2016-11-29: qty 30

## 2016-11-29 MED ORDER — HEPARIN SODIUM (PORCINE) 1000 UNIT/ML IJ SOLN
INTRAMUSCULAR | Status: AC
  Filled 2016-11-29: qty 1

## 2016-11-29 MED ORDER — VERAPAMIL HCL 2.5 MG/ML IV SOLN
INTRAVENOUS | Status: DC | PRN
  Administered 2016-11-29: 10 mL via INTRA_ARTERIAL

## 2016-11-29 MED ORDER — FENTANYL CITRATE (PF) 100 MCG/2ML IJ SOLN
INTRAMUSCULAR | Status: AC
  Filled 2016-11-29: qty 2

## 2016-11-29 MED ORDER — MIDAZOLAM HCL 2 MG/2ML IJ SOLN
INTRAMUSCULAR | Status: AC
  Filled 2016-11-29: qty 2

## 2016-11-29 MED ORDER — IOPAMIDOL (ISOVUE-370) INJECTION 76%
INTRAVENOUS | Status: AC
  Filled 2016-11-29: qty 100

## 2016-11-29 MED ORDER — LIDOCAINE HCL (PF) 1 % IJ SOLN
INTRAMUSCULAR | Status: DC | PRN
  Administered 2016-11-29: 2 mL

## 2016-11-29 MED ORDER — ZOLPIDEM TARTRATE 5 MG PO TABS
5.0000 mg | ORAL_TABLET | Freq: Once | ORAL | Status: AC
  Administered 2016-11-29: 5 mg via ORAL
  Filled 2016-11-29: qty 1

## 2016-11-29 MED ORDER — VERAPAMIL HCL 2.5 MG/ML IV SOLN
INTRAVENOUS | Status: AC
  Filled 2016-11-29: qty 2

## 2016-11-29 SURGICAL SUPPLY — 11 items
CATH 5FR JL3.5 JR4 ANG PIG MP (CATHETERS) ×1 IMPLANT
CATH INFINITI 5FR JL4 (CATHETERS) ×1 IMPLANT
DEVICE RAD COMP TR BAND LRG (VASCULAR PRODUCTS) ×1 IMPLANT
GLIDESHEATH SLEND SS 6F .021 (SHEATH) ×1 IMPLANT
GUIDEWIRE INQWIRE 1.5J.035X260 (WIRE) IMPLANT
INQWIRE 1.5J .035X260CM (WIRE) ×2
KIT HEART LEFT (KITS) ×2 IMPLANT
PACK CARDIAC CATHETERIZATION (CUSTOM PROCEDURE TRAY) ×2 IMPLANT
SYR MEDRAD MARK V 150ML (SYRINGE) ×2 IMPLANT
TRANSDUCER W/STOPCOCK (MISCELLANEOUS) ×2 IMPLANT
TUBING CIL FLEX 10 FLL-RA (TUBING) ×2 IMPLANT

## 2016-11-29 NOTE — Plan of Care (Signed)
Problem: Pain Managment: Goal: General experience of comfort will improve Outcome: Progressing No complain of pain. Encouraged to let nurse know when in pain.

## 2016-11-29 NOTE — H&P (View-Only) (Signed)
    Trop mildly elevated 0.06.  Will start Heparin IV  Candee Furbish, MD

## 2016-11-29 NOTE — Progress Notes (Signed)
  Echocardiogram 2D Echocardiogram has been performed.  Sara [name] 11/29/2016, 4:19 PM

## 2016-11-29 NOTE — Progress Notes (Signed)
PROGRESS NOTE    Sara [name]  YJE:[id number] DOB: 05-May-1932 DOA: 11/28/2016 PCP: Abner Greenspan, MD    Brief Narrative:Patient is a 83 year old female with history of hypertension, osteoarthritis, GERD presented to ED with shortness of breath with exertion worsening in the last 2 months.   Assessment & Plan:   Principal Problem:   Dyspnea on exertion Active Problems:   HYPERCHOLESTEROLEMIA   Essential hypertension   GERD   Chest pain   Elevated troponin   Unstable angina:  IV heparin, aspirin,  S/p cardiac catheterization. Showing minimal CAD, no PCI.  Mild anterolateral wall hypokinesis noted.  Echocardiogram ordered.  Pt ? Allergic to lasix.  Bmp in am.    Hypertension: well controlled.    Hyperlipidemia. Get lipid panel.   Bradycardia:  Monitor on cardizem. Asymptomatic.     DVT prophylaxis: heparin gtt.  Code Status: (full code.  Family Communication: none at bedside.  Disposition Plan: home in am.    Consultants:   Cardiology.    Procedures:cardiac cath  Antimicrobials: none.   Subjective: Currently chest pain free. No nausea, or vomiting. abd pain.    Objective: Vitals:   11/29/16 1015 11/29/16 1030 11/29/16 1047 11/29/16 1300  BP: 132/86 132/76 130/73 139/71  Pulse: (!) 50     Resp: 17     Temp:    98.3 F (36.8 C)  TempSrc:    Oral  SpO2: 97% 98% 100% 97%  Weight:      Height:        Intake/Output Summary (Last 24 hours) at 11/29/16 1639 Last data filed at 11/29/16 1030  Gross per 24 hour  Intake           277.71 ml  Output              850 ml  Net          -572.29 ml   Filed Weights   11/28/16 1800 11/29/16 0421  Weight: 68.3 kg (150 lb 9.6 oz) 68 kg (150 lb)    Examination:  General exam: Appears calm and comfortable  Respiratory system: Clear to auscultation. Respiratory effort normal. Cardiovascular system: S1 & S2 heard, RRR. No JVD, murmurs, rubs, gallops or clicks. No pedal edema. Gastrointestinal  system: Abdomen is nondistended, soft and nontender. No organomegaly or masses felt. Normal bowel sounds heard. Central nervous system: Alert and oriented. No focal neurological deficits. Extremities: Symmetric 5 x 5 power. Skin: No rashes, lesions or ulcers Psychiatry: Judgement and insight appear normal. Mood & affect appropriate.     Data Reviewed: I have personally reviewed following labs and imaging studies  CBC:  Recent Labs Lab 11/28/16 1352  WBC 8.3  NEUTROABS 4.5  HGB 13.7  HCT 43.0  MCV 87.2  PLT 637   Basic Metabolic Panel:  Recent Labs Lab 11/28/16 1352 11/29/16 0508  NA 140 140  K 3.7 3.7  CL 107 106  CO2 24 26  GLUCOSE 100* 101*  BUN 15 13  CREATININE 0.95 0.82  CALCIUM 9.7 9.2   GFR: Estimated Creatinine Clearance: 47.8 mL/min (by C-G formula based on SCr of 0.82 mg/dL). Liver Function Tests:  Recent Labs Lab 11/28/16 1352  AST 19  ALT 13*  ALKPHOS 90  BILITOT 0.9  PROT 7.2  ALBUMIN 4.2   No results for input(s): LIPASE, AMYLASE in the last 168 hours. No results for input(s): AMMONIA in the last 168 hours. Coagulation Profile:  Recent Labs Lab 11/29/16 0503  INR 1.08  Cardiac Enzymes:  Recent Labs Lab 11/28/16 1751 11/28/16 2310 11/29/16 0508  TROPONINI 0.06* 0.08* 0.08*   BNP (last 3 results) No results for input(s): PROBNP in the last 8760 hours. HbA1C: No results for input(s): HGBA1C in the last 72 hours. CBG: No results for input(s): GLUCAP in the last 168 hours. Lipid Profile:  Recent Labs  11/29/16 0508  CHOL 187  HDL 53  LDLCALC 122*  TRIG 60  CHOLHDL 3.5   Thyroid Function Tests: No results for input(s): TSH, T4TOTAL, FREET4, T3FREE, THYROIDAB in the last 72 hours. Anemia Panel: No results for input(s): VITAMINB12, FOLATE, FERRITIN, TIBC, IRON, RETICCTPCT in the last 72 hours. Sepsis Labs: No results for input(s): PROCALCITON, LATICACIDVEN in the last 168 hours.  No results found for this or any  previous visit (from the past 240 hour(s)).       Radiology Studies: Dg Chest 2 View  Result Date: 11/28/2016 CLINICAL DATA:  Chest tightness EXAM: CHEST  2 VIEW COMPARISON:  10/25/2008 FINDINGS: Heart size and vascularity normal. Atherosclerotic aorta. Lungs are clear without infiltrate or effusion. IMPRESSION: No active cardiopulmonary disease. Electronically Signed   By: Franchot Gallo M.D.   On: 11/28/2016 14:28        Scheduled Meds: . aspirin EC  81 mg Oral Daily  . cholecalciferol  1,000 Units Oral Daily  . diltiazem  120 mg Oral Daily  . hydrochlorothiazide  25 mg Oral Daily  . potassium chloride  10 mEq Oral Daily   Continuous Infusions: . heparin 800 Units/hr (11/28/16 2030)     LOS: 0 days    Time spent: 63 min    Kree Rafter, MD [facility] Pager [phone]  If 7PM-7AM, please contact night-coverage [email or web address] Password [facility] 11/29/2016, 4:39 PM

## 2016-11-29 NOTE — Progress Notes (Signed)
ANTICOAGULATION CONSULT NOTE - Follow Up Consult  Pharmacy Consult for Heparin  Indication: chest pain/ACS  Patient Measurements: Height: 5\' 2"  (157.5 cm) Weight: 150 lb (68 kg) IBW/kg (Calculated) : 50.1  Vital Signs: Temp: 98.3 F (36.8 C) (06/22 0421) Temp Source: Oral (06/22 0421) BP: 134/78 (06/22 0421) Pulse Rate: 52 (06/22 0421)  Labs:  Recent Labs  11/28/16 1352 11/28/16 1751 11/28/16 2310 11/29/16 0503 11/29/16 0508  HGB 13.7  --   --   --   --   HCT 43.0  --   --   --   --   PLT 195  --   --   --   --   LABPROT  --   --   --  14.0  --   INR  --   --   --  1.08  --   HEPARINUNFRC  --   --   --   --  0.57  CREATININE 0.95  --   --   --   --   TROPONINI  --  0.06* 0.08*  --   --     Estimated Creatinine Clearance: 41.3 mL/min (by C-G formula based on SCr of 0.95 mg/dL).   Assessment: Heparin for mild troponin elevation, plan for cath, initial heparin level is therapeutic  Goal of Therapy:  Heparin level 0.3-0.7 units/ml Monitor platelets by anticoagulation protocol: Yes   Plan:  -Cont heparin at 800 units/hr -1200 HL  Icarus Partch 11/29/2016,5:57 AM

## 2016-11-29 NOTE — Progress Notes (Signed)
   Discussed with Dr. Burt Knack Cath with minimal CAD, no PCI/ intervention Mild anterolateral wall hypokinesis noted. LVEDP - 18 reasonable  Await ECHO. BNP 325  Might be reasonable to use low dose lasix 20mg  QD instead of HCTZ Add ACE-I, lisinopril 5mg  on DC. Check BMET in one week post DC.  Will see in follow up.   Candee Furbish, MD

## 2016-11-29 NOTE — Care Management Note (Signed)
Case Management Note  Patient Details  Name: Sara [name] MRN: [id number] Date of Birth: 08-Jul-1931  Subjective/Objective:   Pt presented for Dyspnea on Exertion. Pt is from home with family support. PTA independent.  Pt has PCP and gets her medications without any problems.                 Action/Plan: No home needs identified at time of visit.   Expected Discharge Date:                  Expected Discharge Plan:  Home/Self Care  In-House Referral:  NA  Discharge planning Services  CM Consult  Post Acute Care Choice:  NA Choice offered to:  NA  DME Arranged:  N/A DME Agency:  NA  HH Arranged:  NA HH Agency:  NA  Status of Service:  Completed, signed off  If discussed at [place] of Stay Meetings, dates discussed:    Additional Comments:  Bethena Roys, RN 11/29/2016, 2:35 PM

## 2016-11-29 NOTE — Interval H&P Note (Signed)
Cath Lab Visit (complete for each Cath Lab visit)  Clinical Evaluation Leading to the Procedure:   ACS: Yes.    Non-ACS:    Anginal Classification: CCS III  Anti-ischemic medical therapy: Minimal Therapy (1 class of medications)  Non-Invasive Test Results: No non-invasive testing performed  Prior CABG: No previous CABG      History and Physical Interval Note:  11/29/2016 9:28 AM  Sara [name]  has presented today for surgery, with the diagnosis of unstable angina  The various methods of treatment have been discussed with the patient and family. After consideration of risks, benefits and other options for treatment, the patient has consented to  Procedure(s): Left Heart Cath and Coronary Angiography (N/A) as a surgical intervention .  The patient's history has been reviewed, patient examined, no change in status, stable for surgery.  I have reviewed the patient's chart and labs.  Questions were answered to the patient's satisfaction.     Sherren Mocha

## 2016-11-29 NOTE — Care Management Obs Status (Signed)
[place] NOTIFICATION   Patient Details  Name: BRELEIGH CARPINO MRN: [id number] Date of Birth: 15-Mar-1932   Medicare Observation Status Notification Given:  Yes    Bethena Roys, RN 11/29/2016, 2:34 PM

## 2016-11-30 DIAGNOSIS — R0609 Other forms of dyspnea: Secondary | ICD-10-CM

## 2016-11-30 DIAGNOSIS — I10 Essential (primary) hypertension: Secondary | ICD-10-CM | POA: Diagnosis not present

## 2016-11-30 DIAGNOSIS — R079 Chest pain, unspecified: Secondary | ICD-10-CM

## 2016-11-30 DIAGNOSIS — E7800 Pure hypercholesterolemia, unspecified: Secondary | ICD-10-CM | POA: Diagnosis not present

## 2016-11-30 LAB — CBC
HEMATOCRIT: 41 % (ref 36.0–46.0)
HEMOGLOBIN: 12.9 g/dL (ref 12.0–15.0)
MCH: 27.3 pg (ref 26.0–34.0)
MCHC: 31.5 g/dL (ref 30.0–36.0)
MCV: 86.9 fL (ref 78.0–100.0)
Platelets: 184 10*3/uL (ref 150–400)
RBC: 4.72 MIL/uL (ref 3.87–5.11)
RDW: 13.1 % (ref 11.5–15.5)
WBC: 7.1 10*3/uL (ref 4.0–10.5)

## 2016-11-30 NOTE — Discharge Instructions (Signed)
Chest Wall Pain Chest wall pain is pain in or around the bones and muscles of your chest. Sometimes, an injury causes this pain. Sometimes, the cause may not be known. This pain may take several weeks or longer to get better. Follow these instructions at home: Pay attention to any changes in your symptoms. Take these actions to help with your pain: Rest as told by your health care provider. Avoid activities that cause pain. These include any activities that use your chest muscles or your abdominal and side muscles to lift heavy items. If directed, apply ice to the painful area: Put ice in a plastic bag. Place a towel between your skin and the bag. Leave the ice on for 20 minutes, 2-3 times per day. Take over-the-counter and prescription medicines only as told by your health care provider. Do not use tobacco products, including cigarettes, chewing tobacco, and e-cigarettes. If you need help quitting, ask your health care provider. Keep all follow-up visits as told by your health care provider. This is important.  Contact a health care provider if: You have a fever. Your chest pain becomes worse. You have new symptoms. Get help right away if: You have nausea or vomiting. You feel sweaty or light-headed. You have a cough with phlegm (sputum) or you cough up blood. You develop shortness of breath. This information is not intended to replace advice given to you by your health care provider. Make sure you discuss any questions you have with your health care provider. Document Released: 05/27/2005 Document Revised: 10/05/2015 Document Reviewed: 08/22/2014 Elsevier Interactive Patient Education  2017 [employer] Refer to this sheet in the next few weeks. These instructions provide you with information about caring for yourself after your procedure. Your health care provider may also give you more specific instructions. Your treatment has been planned according to current  medical practices, but problems sometimes occur. Call your health care provider if you have any problems or questions after your procedure. What can I expect after the procedure? After your procedure, it is typical to have the following:  Bruising at the radial site that usually fades within 1-2 weeks.  Blood collecting in the tissue (hematoma) that may be painful to the touch. It should usually decrease in size and tenderness within 1-2 weeks.  Follow these instructions at home:  Take medicines only as directed by your health care provider.  You may shower 24-48 hours after the procedure or as directed by your health care provider. Remove the bandage (dressing) and gently wash the site with plain soap and water. Pat the area dry with a clean towel. Do not rub the site, because this may cause bleeding.  Do not take baths, swim, or use a hot tub until your health care provider approves.  Check your insertion site every day for redness, swelling, or drainage.  Do not apply powder or lotion to the site.  Do not flex or bend the affected arm for 24 hours or as directed by your health care provider.  Do not push or pull heavy objects with the affected arm for 24 hours or as directed by your health care provider.  Do not lift over 10 lb (4.5 kg) for 5 days after your procedure or as directed by your health care provider.  Ask your health care provider when it is okay to: ? Return to work or school. ? Resume usual physical activities or sports. ? Resume sexual activity.  Do not drive home if  you are discharged the same day as the procedure. Have someone else drive you.  You may drive 24 hours after the procedure unless otherwise instructed by your health care provider.  Do not operate machinery or power tools for 24 hours after the procedure.  If your procedure was done as an outpatient procedure, which means that you went home the same day as your procedure, a responsible adult should  be with you for the first 24 hours after you arrive home.  Keep all follow-up visits as directed by your health care provider. This is important. Contact a health care provider if:  You have a fever.  You have chills.  You have increased bleeding from the radial site. Hold pressure on the site. Get help right away if:  You have unusual pain at the radial site.  You have redness, warmth, or swelling at the radial site.  You have drainage (other than a small amount of blood on the dressing) from the radial site.  The radial site is bleeding, and the bleeding does not stop after 30 minutes of holding steady pressure on the site.  Your arm or hand becomes pale, cool, tingly, or numb. This information is not intended to replace advice given to you by your health care provider. Make sure you discuss any questions you have with your health care provider. Document Released: 06/29/2010 Document Revised: 11/02/2015 Document Reviewed: 12/13/2013 Elsevier Interactive Patient Education  2018 [employer].

## 2016-11-30 NOTE — Progress Notes (Signed)
Progress Note  Patient Name: Sara [name] Date of Encounter: 11/30/2016  Primary Cardiologist: Marlou Porch  Subjective   No dyspnea or chest pain ready for d/c   Inpatient Medications    Scheduled Meds: . aspirin EC  81 mg Oral Daily  . cholecalciferol  1,000 Units Oral Daily  . diltiazem  120 mg Oral Daily  . hydrochlorothiazide  25 mg Oral Daily  . potassium chloride  10 mEq Oral Daily   Continuous Infusions: . heparin 800 Units/hr (11/28/16 2030)   PRN Meds: acetaminophen, famotidine, morphine injection, ondansetron (ZOFRAN) IV, polyethylene glycol   Vital Signs    Vitals:   11/29/16 1642 11/29/16 2033 11/30/16 0034 11/30/16 0407  BP: (!) 149/76 (!) 149/79 102/61 (!) 101/58  Pulse:  (!) 51 (!) 55 (!) 50  Resp:  20 18 18   Temp: 98.1 F (36.7 C) 98.8 F (37.1 C) 98.7 F (37.1 C) 98.6 F (37 C)  TempSrc: Oral     SpO2: 95% 99% 100% 99%  Weight:    68.4 kg (150 lb 12.8 oz)  Height:        Intake/Output Summary (Last 24 hours) at 11/30/16 0807 Last data filed at 11/30/16 0034  Gross per 24 hour  Intake            80.53 ml  Output              700 ml  Net          -619.47 ml   Filed Weights   11/28/16 1800 11/29/16 0421 11/30/16 0407  Weight: 68.3 kg (150 lb 9.6 oz) 68 kg (150 lb) 68.4 kg (150 lb 12.8 oz)    Telemetry    NSR PAC PVC;s  - Personally Reviewed  ECG    SR PVC no acute ST changes  - Personally Reviewed  Physical Exam   Elderly black female  GEN: No acute distress.   Neck: No JVD Cardiac: RRR, no murmurs, rubs, or gallops.  Respiratory: Clear to auscultation bilaterally. GI: Soft, nontender, non-distended  MS: No edema; No deformity. Neuro:  Nonfocal  Psych: Normal affect   Labs    Chemistry  Recent Labs Lab 11/28/16 1352 11/29/16 0508  NA 140 140  K 3.7 3.7  CL 107 106  CO2 24 26  GLUCOSE 100* 101*  BUN 15 13  CREATININE 0.95 0.82  CALCIUM 9.7 9.2  PROT 7.2  --   ALBUMIN 4.2  --   AST 19  --   ALT 13*  --    ALKPHOS 90  --   BILITOT 0.9  --   GFRNONAA 54* >60  GFRAA >60 >60  ANIONGAP 9 8     Hematology  Recent Labs Lab 11/28/16 1352 11/30/16 0250  WBC 8.3 7.1  RBC 4.93 4.72  HGB 13.7 12.9  HCT 43.0 41.0  MCV 87.2 86.9  MCH 27.8 27.3  MCHC 31.9 31.5  RDW 13.2 13.1  PLT 195 184    Cardiac Enzymes  Recent Labs Lab 11/28/16 1751 11/28/16 2310 11/29/16 0508  TROPONINI 0.06* 0.08* 0.08*     Recent Labs Lab 11/28/16 1355  TROPIPOC 0.01     BNP  Recent Labs Lab 11/28/16 1751  BNP 325.4*     DDimer   Recent Labs Lab 11/28/16 1352  DDIMER 0.41     Radiology    Dg Chest 2 View  Result Date: 11/28/2016 CLINICAL DATA:  Chest tightness EXAM: CHEST  2 VIEW COMPARISON:  10/25/2008  FINDINGS: Heart size and vascularity normal. Atherosclerotic aorta. Lungs are clear without infiltrate or effusion. IMPRESSION: No active cardiopulmonary disease. Electronically Signed   By: Franchot Gallo M.D.   On: 11/28/2016 14:28    Cardiac Studies   Echo EF 60-65% grade one diastolic    Assessment & Plan    Chest Pain:  Cath with no CAD non cardiac CHF: ? Mild diastolic EDP 18 EF normal by echo with grade one diastolic Dr Marlou Porch notes indicate ? Change to lasix but listed as allergy Patient not aware of specific issue Continue hydrodiuril Ok to d/c home outpatient f/u [employer], [place], MD  11/30/2016, 8:07 AM

## 2016-12-02 NOTE — Discharge Summary (Signed)
Physician Discharge Summary  Sara [name] NOI:[id number] DOB: 22-Nov-1931 DOA: 11/28/2016  PCP: Abner Greenspan, MD  Admit date: 11/28/2016 Discharge date: 11/30/2016  Admitted From: Home.  Disposition:  Home.   Recommendations for Outpatient Follow-up:  1. Follow up with PCP in 1-2 weeks 2. Please obtain BMP/CBC in one week 3. Please follow up  With cardiology Dr Marlou Porch in 2 weeks.     Discharge Condition:stable.  CODE STATUS: full code.  Diet recommendation: Heart Healthy /  Brief/Interim Summary: Patient is a 83 year old female with history of hypertension, osteoarthritis, GERD presented to ED with shortness of breath with exertion worsening in the last 2 months.  Discharge Diagnoses:  Principal Problem:   Dyspnea on exertion Active Problems:   HYPERCHOLESTEROLEMIA   Essential hypertension   GERD   Chest pain   Elevated troponin  Unstable angina:   she was started on IV heparin, aspirin,  S/p cardiac catheterization. Showing minimal CAD, no PCI. Medical management.  Mild anterolateral wall hypokinesis noted.  Echocardiogram ordered , results reviewed. Discussed with Dr Johnsie Cancel..  Follow up with Dr Marlou Porch as outpatient in 1 to 2 weeks.    Hypertension: well controlled.    Hyperlipidemia.[id number]. Discussed to be started on statin. Patient wanted to talk to her PCP before starting the medication.    Bradycardia:  Monitor on cardizem. Asymptomatic.   Discharge Instructions  Discharge Instructions    Diet - low sodium heart healthy    Complete by:  As directed    Discharge instructions    Complete by:  As directed    Please follow up with Dr Marlou Porch with cardiology in 2 to 4 weeks.     Allergies as of 11/30/2016      Reactions   Amoxicillin    Reaction not known   Cefpodoxime Proxetil    Reaction not known   Clarithromycin    Reaction not known   Furosemide    REACTION: doesn't help   Gabapentin Swelling   Caused severe swellling       Medication List    STOP taking these medications   clotrimazole-betamethasone cream Commonly known as:  LOTRISONE   mometasone 0.1 % cream Commonly known as:  ELOCON     TAKE these medications   aspirin EC 81 MG tablet Take 81 mg by mouth daily.   diltiazem 120 MG 24 hr capsule Commonly known as:  TIAZAC TAKE 1 CAPSULE EVERY DAY   EQ SLEEP AID 25 MG tablet Generic drug:  doxylamine (Sleep) Take 25 mg by mouth at bedtime as needed for sleep.   EQ STOOL SOFTENER PO Take 100 mg by mouth daily as needed (constipation).   hydrochlorothiazide 25 MG tablet Commonly known as:  HYDRODIURIL TAKE 1 TABLET EVERY DAY What changed:  See the new instructions.   polyethylene glycol packet Commonly known as:  MIRALAX / GLYCOLAX Take 17 g by mouth daily.   potassium chloride 10 MEQ tablet Commonly known as:  K-DUR TAKE 1 TABLET EVERY DAY What changed:  See the new instructions.   ranitidine 150 MG capsule Commonly known as:  ZANTAC Take 1 capsule (150 mg total) by mouth 2 (two) times daily.   tolterodine 2 MG tablet Commonly known as:  DETROL Take 2 mg by mouth 2 (two) times daily.   Vitamin D-3 1000 units Caps Take 1,000 Units by mouth daily.      Follow-up Information    Tower, Wynelle Fanny, MD. Schedule an appointment as soon as possible for  a visit in 1 week(s).   Specialties:  Family Medicine, Radiology Contact information: [place] [address] [phone]        Jerline Pain, MD. Schedule an appointment as soon as possible for a visit in 2 week(s).   Specialty:  Cardiology Contact information: [address] [place] [address] [phone]          Allergies  Allergen Reactions  . Amoxicillin     Reaction not known  . Cefpodoxime Proxetil     Reaction not known  . Clarithromycin     Reaction not known  . Furosemide     REACTION: doesn't help  . Gabapentin Swelling    Caused severe swellling     Consultations:  Cardiology.    Procedures/Studies: Dg Chest 2 View  Result Date: 11/28/2016 CLINICAL DATA:  Chest tightness EXAM: CHEST  2 VIEW COMPARISON:  10/25/2008 FINDINGS: Heart size and vascularity normal. Atherosclerotic aorta. Lungs are clear without infiltrate or effusion. IMPRESSION: No active cardiopulmonary disease. Electronically Signed   By: Franchot Gallo M.D.   On: 11/28/2016 14:28   cardiac catheterization   Subjective: No chest pain or sob.   Discharge Exam: Vitals:   11/30/16 0900 11/30/16 1015  BP: 124/74 103/76  Pulse:    Resp:    Temp: 98.8 F (37.1 C)    Vitals:   11/30/16 0034 11/30/16 0407 11/30/16 0900 11/30/16 1015  BP: 102/61 (!) 101/58 124/74 103/76  Pulse: (!) 55 (!) 50    Resp: 18 18    Temp: 98.7 F (37.1 C) 98.6 F (37 C) 98.8 F (37.1 C)   TempSrc:   Oral   SpO2: 100% 99% 98%   Weight:  68.4 kg (150 lb 12.8 oz)    Height:        General: Pt is alert, awake, not in acute distress Cardiovascular: RRR, S1/S2 +, no rubs, no gallops Respiratory: CTA bilaterally, no wheezing, no rhonchi Abdominal: Soft, NT, ND, bowel sounds + Extremities: no edema, no cyanosis    The results of significant diagnostics from this hospitalization (including imaging, microbiology, ancillary and laboratory) are listed below for reference.     Microbiology: No results found for this or any previous visit (from the past 240 hour(s)).   Labs: BNP (last 3 results)  Recent Labs  11/28/16 1751  BNP 213.0*   Basic Metabolic Panel:  Recent Labs Lab 11/28/16 1352 11/29/16 0508  NA 140 140  K 3.7 3.7  CL 107 106  CO2 24 26  GLUCOSE 100* 101*  BUN 15 13  CREATININE 0.95 0.82  CALCIUM 9.7 9.2   Liver Function Tests:  Recent Labs Lab 11/28/16 1352  AST 19  ALT 13*  ALKPHOS 90  BILITOT 0.9  PROT 7.2  ALBUMIN 4.2   No results for input(s): LIPASE, AMYLASE in the last 168 hours. No results for input(s): AMMONIA in the last 168  hours. CBC:  Recent Labs Lab 11/28/16 1352 11/30/16 0250  WBC 8.3 7.1  NEUTROABS 4.5  --   HGB 13.7 12.9  HCT 43.0 41.0  MCV 87.2 86.9  PLT 195 184   Cardiac Enzymes:  Recent Labs Lab 11/28/16 1751 11/28/16 2310 11/29/16 0508  TROPONINI 0.06* 0.08* 0.08*   BNP: Invalid input(s): POCBNP CBG: No results for input(s): GLUCAP in the last 168 hours. D-Dimer No results for input(s): DDIMER in the last 72 hours. Hgb A1c No results for input(s): HGBA1C in the last  72 hours. Lipid Profile No results for input(s): CHOL, HDL, LDLCALC, TRIG, CHOLHDL, LDLDIRECT in the last 72 hours. Thyroid function studies No results for input(s): TSH, T4TOTAL, T3FREE, THYROIDAB in the last 72 hours.  Invalid input(s): FREET3 Anemia work up No results for input(s): VITAMINB12, FOLATE, FERRITIN, TIBC, IRON, RETICCTPCT in the last 72 hours. Urinalysis    Component Value Date/Time   COLORURINE yellow 05/01/2010 0952   APPEARANCEUR Cloudy 05/01/2010 0952   LABSPEC 1.010 05/01/2010 0952   PHURINE 7.5 05/01/2010 0952   GLUCOSEU NEGATIVE 07/08/2009 1223   HGBUR trace-intact 05/01/2010 0952   BILIRUBINUR Neg. 07/24/2015 1121   KETONESUR NEGATIVE 07/08/2009 1223   PROTEINUR Neg. 07/24/2015 1121   PROTEINUR 100 (A) 07/08/2009 1223   UROBILINOGEN 0.2 07/24/2015 1121   UROBILINOGEN 0.2 05/01/2010 0952   NITRITE Neg. 07/24/2015 1121   NITRITE negative 05/01/2010 0952   LEUKOCYTESUR Negative 07/24/2015 1121   Sepsis Labs Invalid input(s): PROCALCITONIN,  WBC,  LACTICIDVEN Microbiology No results found for this or any previous visit (from the past 240 hour(s)).   Time coordinating discharge: Over 30 minutes  SIGNED:   Hosie Poisson, MD  [facility] 12/02/2016, 10:49 AM Pager   If 7PM-7AM, please contact night-coverage [email or web address] Password [email or web address]

## 2016-12-03 ENCOUNTER — Ambulatory Visit (INDEPENDENT_AMBULATORY_CARE_PROVIDER_SITE_OTHER): Payer: Medicare HMO | Admitting: Family Medicine

## 2016-12-03 ENCOUNTER — Encounter: Payer: Self-pay | Admitting: Family Medicine

## 2016-12-03 VITALS — BP 146/72 | HR 60 | Temp 98.5°F | Wt 149.5 lb

## 2016-12-03 DIAGNOSIS — I2510 Atherosclerotic heart disease of native coronary artery without angina pectoris: Secondary | ICD-10-CM | POA: Diagnosis not present

## 2016-12-03 DIAGNOSIS — E785 Hyperlipidemia, unspecified: Secondary | ICD-10-CM | POA: Diagnosis not present

## 2016-12-03 DIAGNOSIS — I209 Angina pectoris, unspecified: Secondary | ICD-10-CM

## 2016-12-03 MED ORDER — ATORVASTATIN CALCIUM 10 MG PO TABS: 10.0000 mg | ORAL_TABLET | Freq: Every day | ORAL | 3 refills | Status: DC

## 2016-12-03 MED ORDER — NITROGLYCERIN 0.4 MG SL SUBL: 0.4000 mg | SUBLINGUAL_TABLET | SUBLINGUAL | 1 refills | Status: DC | PRN

## 2016-12-03 NOTE — Progress Notes (Signed)
Marvel Plan WUJ:[id number] DOB: 10/26/1931 DOA: 11/28/2016  PCP: Abner Greenspan, MD  Admit date: 11/28/2016 Discharge date: 11/30/2016  Admitted From: Home.  Disposition:  Home.   Recommendations for Outpatient Follow-up:  1. Follow up with PCP in 1-2 weeks 2. Please obtain BMP/CBC in one week 3. Please follow up  With cardiology Dr Marlou Porch in 2 weeks.    Discharge Condition:stable.  CODE STATUS: full code.  Diet recommendation: Heart Healthy /  Brief/Interim Summary: Patient is a 83 year old female with history of hypertension, osteoarthritis, GERD presented to ED with shortness of breath with exertion worsening in the last 2 months.  Discharge Diagnoses:  Principal Problem:   Dyspnea on exertion Active Problems:   HYPERCHOLESTEROLEMIA   Essential hypertension   GERD   Chest pain   Elevated troponin  Unstable angina:   she was started on IV heparin, aspirin,  S/p cardiac catheterization. Showing minimal CAD, no PCI. Medical management.  Mild anterolateral wall hypokinesis noted.  Echocardiogram ordered , results reviewed. Discussed with Dr Johnsie Cancel. Follow up with Dr Marlou Porch as outpatient in 1 to 2 weeks.    Hypertension: well controlled.    Hyperlipidemia.[id number]. Discussed to be started on statin. Patient wanted to talk to her PCP before starting the medication.    Bradycardia:  Monitor on cardizem. Asymptomatic.   Discharge Instructions      Discharge Instructions    Diet - low sodium heart healthy    Complete by:  As directed    Discharge instructions    Complete by:  As directed    Please follow up with Dr Marlou Porch with cardiology in 2 to 4 weeks.         Allergies as of 11/30/2016      Reactions   Amoxicillin    Reaction not known   Cefpodoxime Proxetil    Reaction not known   Clarithromycin    Reaction not known   Furosemide    REACTION: doesn't help   Gabapentin Swelling   Caused severe swellling          Medication List    STOP taking these medications   clotrimazole-betamethasone cream Commonly known as:  LOTRISONE   mometasone 0.1 % cream Commonly known as:  ELOCON     TAKE these medications   aspirin EC 81 MG tablet Take 81 mg by mouth daily.   diltiazem 120 MG 24 hr capsule Commonly known as:  TIAZAC TAKE 1 CAPSULE EVERY DAY   EQ SLEEP AID 25 MG tablet Generic drug:  doxylamine (Sleep) Take 25 mg by mouth at bedtime as needed for sleep.   EQ STOOL SOFTENER PO Take 100 mg by mouth daily as needed (constipation).   hydrochlorothiazide 25 MG tablet Commonly known as:  HYDRODIURIL TAKE 1 TABLET EVERY DAY What changed:  See the new instructions.   polyethylene glycol packet Commonly known as:  MIRALAX / GLYCOLAX Take 17 g by mouth daily.   potassium chloride 10 MEQ tablet Commonly known as:  K-DUR TAKE 1 TABLET EVERY DAY What changed:  See the new instructions.   ranitidine 150 MG capsule Commonly known as:  ZANTAC Take 1 capsule (150 mg total) by mouth 2 (two) times daily.   tolterodine 2 MG tablet Commonly known as:  DETROL Take 2 mg by mouth 2 (two) times daily.   Vitamin D-3 1000 units Caps Take 1,000 Units by mouth daily.   =======================================  The above was discussed with patient. Admitted with shortness of breath. Minimal increase in troponin  noted. Nonobstructive coronary disease noted on catheterization, she did not require stent placement. Discharged home, due for follow-up labs and follow-up here. Has follow-up with cardiology pending.  Since admission to the hospital and then coming home from the hospital she has had occ L breast pain. This comes and goes.  Can happen at rest but not with exertion.  Still SOB with exertion.  She is not short of breath at rest.  Overall she is not worse than when she was in the hospital.  Lipids discussed the patient along with her other labs. Rationale for statin treatment  discussed. She is already on aspirin.  PMH and SH reviewed  ROS: Per HPI unless specifically indicated in ROS section   Meds, vitals, and allergies reviewed.   GEN: nad, alert and oriented HEENT: mucous membranes moist NECK: supple w/o LA CV: rrr. PULM: ctab, no inc wob ABD: soft, +bs EXT: no edema SKIN: no acute rash

## 2016-12-03 NOTE — Patient Instructions (Signed)
Start lipitor.  Recheck cholesterol in about 2 months.  If you can't tolerate the lipitor with aches then stop the medicine and let us know.  Use nitroglycerin if needed for severe chest pain.  If not relief then go to the hospital or dial 911.  I'll update Dr. Glori Bickers.  Keep the appointment with cardiology.  Go to the lab on the way out.  We'll contact you with your lab report. Take care.  Glad to see you.

## 2016-12-04 ENCOUNTER — Encounter: Payer: Self-pay | Admitting: Family Medicine

## 2016-12-04 DIAGNOSIS — I2510 Atherosclerotic heart disease of native coronary artery without angina pectoris: Secondary | ICD-10-CM | POA: Insufficient documentation

## 2016-12-04 LAB — CBC WITH DIFFERENTIAL/PLATELET
Basophils Absolute: 0.1 10*3/uL (ref 0.0–0.1)
Basophils Relative: 1 % (ref 0.0–3.0)
Eosinophils Absolute: 0.2 10*3/uL (ref 0.0–0.7)
Eosinophils Relative: 2.6 % (ref 0.0–5.0)
HCT: 43.2 % (ref 36.0–46.0)
Hemoglobin: 14.3 g/dL (ref 12.0–15.0)
LYMPHS ABS: 2.9 10*3/uL (ref 0.7–4.0)
Lymphocytes Relative: 35.2 % (ref 12.0–46.0)
MCHC: 33.1 g/dL (ref 30.0–36.0)
MCV: 85.9 fl (ref 78.0–100.0)
MONOS PCT: 10.2 % (ref 3.0–12.0)
Monocytes Absolute: 0.8 10*3/uL (ref 0.1–1.0)
NEUTROS ABS: 4.2 10*3/uL (ref 1.4–7.7)
NEUTROS PCT: 51 % (ref 43.0–77.0)
Platelets: 286 10*3/uL (ref 150.0–400.0)
RBC: 5.03 Mil/uL (ref 3.87–5.11)
RDW: 13.8 % (ref 11.5–15.5)
WBC: 8.2 10*3/uL (ref 4.0–10.5)

## 2016-12-04 LAB — BASIC METABOLIC PANEL
BUN: 16 mg/dL (ref 6–23)
CALCIUM: 10.2 mg/dL (ref 8.4–10.5)
CO2: 32 meq/L (ref 19–32)
Chloride: 101 mEq/L (ref 96–112)
Creatinine, Ser: 0.95 mg/dL (ref 0.40–1.20)
GFR: 72.32 mL/min (ref >60.00)
Glucose, Bld: 101 mg/dL — ABNORMAL HIGH (ref 70–99)
Potassium: 4.1 mEq/L (ref 3.5–5.1)
Sodium: 138 mEq/L (ref 135–145)

## 2016-12-04 NOTE — Assessment & Plan Note (Signed)
There are multiple issues going on. All discussed with patient and her daughter.  Due for follow-up labs, see notes on labs.  Hyperlipidemia. She has indication for statin. Not yet treated with statin. Her daughter had intolerance to Lipitor. Her daughter had significant muscle aches on the medication. Discussed with patient about options. Discussed with patient about statin intolerance that is a potential concern.  Given her known coronary disease it is reasonable to start Lipitor, since it is a relatively potent statin. If she cannot tolerate it, if she has muscle aches, then stop the medication and let us know. We may need to decrease her dose, decrease dosing frequency, or change to a less potent statin to see if she can tolerate that. All discussed with patient. If she can tolerate Lipitor and we will recheck her lipids in about 2 months.  Coronary disease, not requiring stent. Minimal elevation in troponin. She does have exertional shortness of breath that could be an anginal equipment. She does not have chest pain with exertion but she has episodic left breast area discomfort at rest. Discussed with her about options, at this point since she is not worse the plan would still be for medical management. Add on statin, use nitroglycerin if needed, routine nitroglycerin cautions given. If she has escalating chest pain then go to emergency room.  Keep follow-up with cardiology. She was scheduled for annual Medicare wellness visit in the near future, this was canceled. I advised her to keep her routine physical with her primary doc. Routed to PCP as FYI. Appreciate help of all involved. >25 minutes spent in face to face time with patient, >50% spent in counselling or coordination of care.

## 2016-12-06 ENCOUNTER — Ambulatory Visit: Payer: Medicare HMO | Admitting: Family Medicine

## 2016-12-06 ENCOUNTER — Ambulatory Visit: Payer: Medicare HMO

## 2016-12-09 ENCOUNTER — Ambulatory Visit: Payer: Commercial Managed Care - HMO

## 2016-12-10 ENCOUNTER — Other Ambulatory Visit: Payer: Self-pay | Admitting: Family Medicine

## 2016-12-16 ENCOUNTER — Ambulatory Visit (INDEPENDENT_AMBULATORY_CARE_PROVIDER_SITE_OTHER): Payer: Medicare HMO | Admitting: Family Medicine

## 2016-12-16 ENCOUNTER — Encounter: Payer: Self-pay | Admitting: Family Medicine

## 2016-12-16 VITALS — BP 130/68 | HR 57 | Temp 98.0°F | Ht 61.5 in | Wt 148.5 lb

## 2016-12-16 DIAGNOSIS — E559 Vitamin D deficiency, unspecified: Secondary | ICD-10-CM | POA: Diagnosis not present

## 2016-12-16 DIAGNOSIS — Z23 Encounter for immunization: Secondary | ICD-10-CM

## 2016-12-16 DIAGNOSIS — R739 Hyperglycemia, unspecified: Secondary | ICD-10-CM | POA: Diagnosis not present

## 2016-12-16 DIAGNOSIS — E2839 Other primary ovarian failure: Secondary | ICD-10-CM | POA: Diagnosis not present

## 2016-12-16 DIAGNOSIS — I10 Essential (primary) hypertension: Secondary | ICD-10-CM | POA: Diagnosis not present

## 2016-12-16 DIAGNOSIS — Z0000 Encounter for general adult medical examination without abnormal findings: Secondary | ICD-10-CM

## 2016-12-16 DIAGNOSIS — M8580 Other specified disorders of bone density and structure, unspecified site: Secondary | ICD-10-CM

## 2016-12-16 DIAGNOSIS — E7800 Pure hypercholesterolemia, unspecified: Secondary | ICD-10-CM

## 2016-12-16 DIAGNOSIS — I2510 Atherosclerotic heart disease of native coronary artery without angina pectoris: Secondary | ICD-10-CM | POA: Diagnosis not present

## 2016-12-16 NOTE — Assessment & Plan Note (Signed)
Reviewed health habits including diet and exercise and skin cancer prevention Reviewed appropriate screening tests for age  Also reviewed health mt list, fam hx and immunization status , as well as social and family history   See HPI Has not had AMW -she cancelled it  Will work on Doctor, general practice and cognition is good  Labs reviewed and more planed for 1 month  prevnar vaccine today  dexa ordered  Unsure if she wants to screen for colon cancer screening (cologuard nl 2015)

## 2016-12-16 NOTE — Assessment & Plan Note (Signed)
Disc goals for lipids and reasons to control them Rev labs with pt (last check) Rev low sat fat diet in detail  Now on and tolerating 10 mg atorvastatin  Labs planned for a mo Goal is LDL 70 or less in light of CAD

## 2016-12-16 NOTE — Assessment & Plan Note (Signed)
No stenting  On asa  Now tolerating 10 mg lipitor (for lipid check in a mo) Disc risk factors  For f/u with cardiology soon  bp is well controlled

## 2016-12-16 NOTE — Assessment & Plan Note (Signed)
bp in fair control at this time  BP Readings from Last 1 Encounters:  12/16/16 130/68   No changes needed Disc lifstyle change with low sodium diet and exercise  Labs reviewed

## 2016-12-16 NOTE — Assessment & Plan Note (Signed)
Due for dexa Disc need for calcium/ vitamin D/ wt bearing exercise and bone density test every 2 y to monitor Disc safety/ fracture risk in detail    

## 2016-12-16 NOTE — Patient Instructions (Addendum)
For cholestrol :   Avoid red meat/ fried foods/ egg yolks/ fatty breakfast meats/ butter, cheese and high fat dairy/ and shellfish    prevnar vaccine today   You are due for a tetanus shot - look into getting it at a pharmacy   Work on your advance directive and bring [place] the copy so we can put it in your chart   Continue the lipitor and if you develop any side effects like muscle pain   We will schedule you for a bone density test at check out  You need fasting labs in 1 month

## 2016-12-16 NOTE — Assessment & Plan Note (Signed)
D level with next labs Disc imp to bone and overall health

## 2016-12-16 NOTE — Progress Notes (Signed)
Subjective:    Patient ID: Sara [name], female    DOB: 1932-04-18, 83 y.o.   MRN: [id number]  HPI Here for health maintenance exam and to review chronic medical problems    Feeling better overall- had no stent req  On lipitor and asa  occ cp at times / sometimes gets tired  Has cardiac f/u on Thursday for newly diagnosed non-obst CAD   Wt Readings from Last 3 Encounters:  12/16/16 148 lb 8 oz (67.4 kg)  12/03/16 149 lb 8 oz (67.8 kg)  11/30/16 150 lb 12.8 oz (68.4 kg)  bmi is 27.6  Has not had amw   Due for pcv 13  Tetanus shot 10/99  dexa 04 osteopenia  Hx of vit D def  Takes ca and D  Would like to schedule dexa  No falls or fx   Mammogram 4/18  Nl  Daughter and aunt with breast cancer  Self exam-no lumps  Has had a hysterectomy  No gyn symptoms   Zoster status -no imm  Adv directive-plans to work on   [employer]- doing pretty well with memory overall    colonosocopy 3/15 - could not complete due to tortuous colon  She did cologuard with GI and it was negative Unsure if she wants to continue screening at her age   bp is stable today  No cp or palpitations or headaches or edema  No side effects to medicines  BP Readings from Last 3 Encounters:  12/16/16 130/68  12/03/16 (!) 146/72  11/30/16 103/76     Hx of hyperglycemia Lab Results  Component Value Date   HGBA1C 6.3 07/22/2014    Hx of hyperlipidemia in setting of CAD req stent  Lab Results  Component Value Date   CHOL 187 11/29/2016   HDL 53 11/29/2016   LDLCALC 122 (H) 11/29/2016   LDLDIRECT 143.1 06/16/2013   TRIG 60 11/29/2016   CHOLHDL 3.5 11/29/2016   Dr Damita Dunnings started her on low dose lipitor 6/26 Doing pretty well with it  No muscle aches and pains  Also eating healthy   Patient Active Problem List   Diagnosis Date Noted  . Estrogen deficiency 12/16/2016  . CAD (coronary artery disease) 12/04/2016  . Elevated troponin   . Chest pain 11/28/2016  . Dyspnea on exertion  11/28/2016  . Intertrigo 03/15/2016  . Dyspepsia 03/15/2016  . Bilateral impacted cerumen 03/15/2016  . 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 Pain of left thumb 07/24/2015  . Abdominal pain 07/24/2015  . Abnormal urine odor 03/06/2015  . Eczema 03/06/2015  . Caregiver stress 07/22/2014  . Left foot pain 04/15/2014  . Rash and nonspecific skin eruption 04/15/2014  . Vertigo, benign paroxysmal 02/11/2014  . Hyperglycemia 02/11/2014  . Arm swelling 12/07/2012  . Intertrigo labialis 12/07/2012  . Frequent urination 09/04/2012  . Ingrown hair 09/04/2012  . Breast pain 02/08/2011  . Hip pain, left 02/08/2011  . CONSTIPATION, CHRONIC 07/03/2010  . Vitamin D deficiency 09/22/2008  . Osteopenia 06/24/2008  . HYPERCHOLESTEROLEMIA 12/08/2006  . Essential hypertension 12/08/2006  . GERD 12/08/2006  . SPINAL STENOSIS 12/08/2006   Past Medical History:  Diagnosis Date  . Allergy   . CAD (coronary artery disease)   . Clinical trial participant    [employer] studies in familial dementia  . Dyspnea 11/28/2016  . Frequent UTI   . GERD (gastroesophageal reflux disease)   . Hypertension   . Osteoarthritis    hips, back  . Osteopenia   .  Pneumonia   . Spinal stenosis   . Stroke [facility]) 1985   Past Surgical History:  Procedure Laterality Date  . ABDOMINAL HYSTERECTOMY     partial has one ovary  . APPENDECTOMY    . CATARACT EXTRACTION Bilateral   . LEFT HEART CATH AND CORONARY ANGIOGRAPHY N/A 11/29/2016   Procedure: Left Heart Cath and Coronary Angiography;  Surgeon: Sherren Mocha, MD;  Location: [facility];  Service: Cardiovascular;  Laterality: N/A;  . LUMBAR DISC SURGERY     x 3  . rotator cuff surgery Right 2010   Social History  Substance Use Topics  . Smoking status: Never Smoker  . Smokeless tobacco: Never Used  . Alcohol use No   Family History  Problem Relation Age of Onset  . Lung cancer Brother   . Other Mother        kidney tumor  . Hypertension  Mother   . Diabetes Sister   . Diabetes Brother   . Diabetes Sister        x 2  . Colon cancer Maternal Uncle   . Other Sister        intestine burst  . Breast cancer Maternal Aunt        x 2  . Breast cancer Daughter   . Diabetes Son        x 3  . Diabetes Daughter   . Esophageal cancer Neg Hx   . Rectal cancer Neg Hx   . Stomach cancer Neg Hx    Allergies  Allergen Reactions  . Amoxicillin     Reaction not known  . Cefpodoxime Proxetil     Reaction not known  . Clarithromycin     Reaction not known  . Furosemide     REACTION: doesn't help  . Gabapentin Swelling    Caused severe swellling   Current Outpatient Prescriptions on File Prior to Visit  Medication Sig Dispense Refill  . aspirin EC 81 MG tablet Take 81 mg by mouth daily.      Marland Kitchen atorvastatin (LIPITOR) 10 MG tablet Take 1 tablet (10 mg total) by mouth daily. 90 tablet 3  . CARTIA XT 120 MG 24 hr capsule TAKE 1 CAPSULE EVERY DAY 90 capsule 1  . Cholecalciferol (VITAMIN D-3) 1000 UNITS CAPS Take 1,000 Units by mouth daily.     Marland Kitchen diltiazem (TIAZAC) 120 MG 24 hr capsule TAKE 1 CAPSULE EVERY DAY 90 capsule 0  . Docusate Sodium (EQ STOOL SOFTENER PO) Take 100 mg by mouth daily as needed (constipation).     Marland Kitchen doxylamine, Sleep, (EQ SLEEP AID) 25 MG tablet Take 25 mg by mouth at bedtime as needed for sleep.     . hydrochlorothiazide (HYDRODIURIL) 25 MG tablet TAKE 1 TABLET EVERY DAY 90 tablet 1  . nitroGLYCERIN (NITROSTAT) 0.4 MG SL tablet Place 1 tablet (0.4 mg total) under the tongue every 5 (five) minutes as needed for chest pain (max 3 doses in 15 minutes). 25 tablet 1  . polyethylene glycol (MIRALAX / GLYCOLAX) packet Take 17 g by mouth daily.     . potassium chloride (K-DUR,KLOR-CON) 10 MEQ tablet TAKE 1 TABLET EVERY DAY 90 tablet 1  . ranitidine (ZANTAC) 150 MG capsule Take 1 capsule (150 mg total) by mouth 2 (two) times daily. 60 capsule 11   No current facility-administered medications on file prior to visit.        Review of Systems    Review of Systems  Constitutional: Negative for  fever, appetite change, fatigue and unexpected weight change.  Eyes: Negative for pain and visual disturbance.  Respiratory: Negative for cough and shortness of breath.   Cardiovascular: Negative for cp or palpitations   (CP is much improved) pos for low exercise tolerance  Gastrointestinal: Negative for nausea, diarrhea and constipation.  Genitourinary: Negative for urgency and frequency.  Skin: Negative for pallor or rash   MSK pos for chronic low back pain with mild weakness of L leg (baseline) Neurological: Negative for weakness, light-headedness, numbness and headaches.  Hematological: Negative for adenopathy. Does not bruise/bleed easily.  Psychiatric/Behavioral: Negative for dysphoric mood. The patient is not nervous/anxious.      Objective:   Physical Exam  Constitutional: She appears well-developed and well-nourished. No distress.  Well appearing  HENT:  Head: Normocephalic and atraumatic.  Right Ear: External ear normal.  Left Ear: External ear normal.  Mouth/Throat: Oropharynx is clear and moist.  Eyes: Conjunctivae and EOM are normal. Pupils are equal, round, and reactive to light. No scleral icterus.  Neck: Normal range of motion. Neck supple. No JVD present. Carotid bruit is not present. No thyromegaly present.  Cardiovascular: Normal rate, regular rhythm, normal heart sounds and intact distal pulses.  Exam reveals no gallop.   Pulmonary/Chest: Effort normal and breath sounds normal. No respiratory distress. She has no wheezes. She exhibits no tenderness.  Abdominal: Soft. Bowel sounds are normal. She exhibits no distension, no abdominal bruit and no mass. There is no tenderness.  Genitourinary: No breast swelling, tenderness, discharge or bleeding.  Genitourinary Comments: Breast exam: No mass, nodules, thickening, tenderness, bulging, retraction, inflamation, nipple discharge or skin changes  noted.  No axillary or clavicular LA.       Musculoskeletal: Normal range of motion. She exhibits no edema or tenderness.  Scoliosis -thoracolumbar noted   Lymphadenopathy:    She has no cervical adenopathy.  Neurological: She is alert. She has normal reflexes. No cranial nerve deficit. She exhibits normal muscle tone. Coordination normal.  Skin: Skin is warm and dry. No rash noted. No erythema. No pallor.  Some patches of dry skin   Psychiatric: She has a normal mood and affect.  Mentally sharp          Assessment & Plan:   Problem List Items Addressed This Visit      Cardiovascular and Mediastinum   CAD (coronary artery disease)    No stenting  On asa  Now tolerating 10 mg lipitor (for lipid check in a mo) Disc risk factors  For f/u with cardiology soon  bp is well controlled      Essential hypertension    bp in fair control at this time  BP Readings from Last 1 Encounters:  12/16/16 130/68   No changes needed Disc lifstyle change with low sodium diet and exercise  Labs reviewed         Musculoskeletal and Integument   Osteopenia    Due for dexa Disc need for calcium/ vitamin D/ wt bearing exercise and bone density test every 2 y to monitor Disc safety/ fracture risk in detail          Other   Estrogen deficiency   Relevant Orders   DG Bone Density   HYPERCHOLESTEROLEMIA    Disc goals for lipids and reasons to control them Rev labs with pt (last check) Rev low sat fat diet in detail  Now on and tolerating 10 mg atorvastatin  Labs planned for a mo Goal is LDL 70 or  less in light of CAD       Hyperglycemia    Due for A1C at next labs  she is not watching carbs or sugar diligently  Disc diet and strategy for DM prevention      Routine general medical examination at a health care facility - Primary    Reviewed health habits including diet and exercise and skin cancer prevention Reviewed appropriate screening tests for age  Also reviewed health mt  list, fam hx and immunization status , as well as social and family history   See HPI Has not had AMW -she cancelled it  Will work on Doctor, general practice and cognition is good  Labs reviewed and more planed for 1 month  prevnar vaccine today  dexa ordered  Unsure if she wants to screen for colon cancer screening (cologuard nl 2015)      Vitamin D deficiency    D level with next labs Disc imp to bone and overall health       Other Visit Diagnoses    Need for vaccination with 13-polyvalent pneumococcal conjugate vaccine       Relevant Orders   Pneumococcal conjugate vaccine 13-valent (Completed)

## 2016-12-16 NOTE — Assessment & Plan Note (Signed)
Due for A1C at next labs  she is not watching carbs or sugar diligently  Disc diet and strategy for DM prevention

## 2016-12-18 NOTE — Progress Notes (Signed)
Cardiology Office Note    Date:  12/19/2016   ID:  Sara [name], DOB 1931/11/26, MRN [id number]  PCP:  Abner Greenspan, MD  Cardiologist:  Dr. Marlou Porch  CC: post hospital follow up   History of Present Illness:  Sara [name] is a 83 y.o. female with a history of HTN, GERD and recently diagnosed non obst CAD and diastolic CHF who presents to clinic for post hospital follow up.   She was recently admitted 6/21-6/23/18 for DOE. Troponin was minimally elevated (0.06--> 0.08--> 0.08). She underwent [place] on 11/29/16 which showed moderate stenosis of small to medium caliber non dominant RCA (70%) and minimal irregularities of left system, EF 50-55% with mild segmental LV dysfunction of distal anterolateral wall and mildly elevated filling pressures. Medical therapy was recommended. 2D ECHO 11/29/16 showed EF 60-65% with no WMAs, G1DD, mild AR, mild TR, mod PR, PA pressure 36. She was discharged on home HCTZ 25mg  daily.  Today she presents to clinic for follow up. She has been having some gas like pain in her left breast. It's intermittent and last seconds. It's worse when she gets fatigued or when she bends over. It comes on randomly throughout the day. She still does have shortness of breath with exertion.  No LE edema, orthopnea or PND. She has occasional dizziness but no syncope. No blood in stool or urine. No palpitations. No claudication but has chronic pain in legs. She never smoked but was exposed to a lot of second hand smoke. Also just so tired. She feels like she is more tired than she would expect from just "getting old."   Past Medical History:  Diagnosis Date  . Allergy   . CAD (coronary artery disease)   . Clinical trial participant    [facility] studies in familial dementia  . Dyspnea 11/28/2016  . Frequent UTI   . GERD (gastroesophageal reflux disease)   . Hypertension   . Osteoarthritis    hips, back  . Osteopenia   . Pneumonia   . Spinal stenosis     . Stroke [facility]) 1985    Past Surgical History:  Procedure Laterality Date  . ABDOMINAL HYSTERECTOMY     partial has one ovary  . APPENDECTOMY    . CATARACT EXTRACTION Bilateral   . LEFT HEART CATH AND CORONARY ANGIOGRAPHY N/A 11/29/2016   Procedure: Left Heart Cath and Coronary Angiography;  Surgeon: Sherren Mocha, MD;  Location: [facility];  Service: Cardiovascular;  Laterality: N/A;  . LUMBAR DISC SURGERY     x 3  . rotator cuff surgery Right 2010    Current Medications: Outpatient Medications Prior to Visit  Medication Sig Dispense Refill  . aspirin EC 81 MG tablet Take 81 mg by mouth daily.      Marland Kitchen atorvastatin (LIPITOR) 10 MG tablet Take 1 tablet (10 mg total) by mouth daily. 90 tablet 3  . CARTIA XT 120 MG 24 hr capsule TAKE 1 CAPSULE EVERY DAY 90 capsule 1  . Cholecalciferol (VITAMIN D-3) 1000 UNITS CAPS Take 1,000 Units by mouth daily.     Marland Kitchen diltiazem (TIAZAC) 120 MG 24 hr capsule TAKE 1 CAPSULE EVERY DAY 90 capsule 0  . Docusate Sodium (EQ STOOL SOFTENER PO) Take 100 mg by mouth daily as needed (constipation).     Marland Kitchen doxylamine, Sleep, (EQ SLEEP AID) 25 MG tablet Take 25 mg by mouth at bedtime as needed for sleep.     . hydrochlorothiazide (HYDRODIURIL)  25 MG tablet TAKE 1 TABLET EVERY DAY 90 tablet 1  . nitroGLYCERIN (NITROSTAT) 0.4 MG SL tablet Place 1 tablet (0.4 mg total) under the tongue every 5 (five) minutes as needed for chest pain (max 3 doses in 15 minutes). 25 tablet 1  . polyethylene glycol (MIRALAX / GLYCOLAX) packet Take 17 g by mouth daily.     . potassium chloride (K-DUR,KLOR-CON) 10 MEQ tablet TAKE 1 TABLET EVERY DAY 90 tablet 1  . ranitidine (ZANTAC) 150 MG capsule Take 1 capsule (150 mg total) by mouth 2 (two) times daily. 60 capsule 11   No facility-administered medications prior to visit.      Allergies:   Amoxicillin; Cefpodoxime proxetil; Clarithromycin; Furosemide; and Gabapentin   Social History   Social History  . Marital status:  Married    Spouse name: N/A  . Number of children: 6  . Years of education: N/A   Occupational History  . RETIRED Retired   Social History Main Topics  . Smoking status: Never Smoker  . Smokeless tobacco: Never Used  . Alcohol use No  . Drug use: No  . Sexual activity: Not Asked   Other Topics Concern  . None   Social History Narrative  . None     Family History:  The patient's family history includes Breast cancer in her daughter and maternal aunt; Colon cancer in her maternal uncle; Diabetes in her brother, daughter, sister, sister, and son; Hypertension in her mother; Lung cancer in her brother; Other in her mother and sister.      ROS:   Please see the history of present illness.    ROS All other systems reviewed and are negative.   PHYSICAL EXAM:   VS:  BP 138/70   Pulse 62   Ht 5' 1.5" (1.562 m)   Wt 149 lb (67.6 kg)   SpO2 99%   BMI 27.70 kg/m    GEN: Well nourished, well developed, in no acute distress  HEENT: normal  Neck: no JVD, carotid bruits, or masses Cardiac: RRR; no murmurs, rubs, or gallops,no edema  Respiratory:  clear to auscultation bilaterally, normal work of breathing GI: soft, nontender, nondistended, + BS MS: no deformity or atrophy  Skin: warm and dry, no rash Neuro:  Alert and Oriented x 3, Strength and sensation are intact Psych: euthymic mood, full affect      Wt Readings from Last 3 Encounters:  12/19/16 149 lb (67.6 kg)  12/16/16 148 lb 8 oz (67.4 kg)  12/03/16 149 lb 8 oz (67.8 kg)      Studies/Labs Reviewed:   EKG:  EKG is NOT ordered today.    Recent Labs: 11/28/2016: ALT 13; B Natriuretic Peptide 325.4 12/04/2016: BUN 16; Creatinine, Ser 0.95; Hemoglobin 14.3; Platelets 286.0; Potassium 4.1; Sodium 138   Lipid Panel    Component Value Date/Time   CHOL 187 11/29/2016 0508   TRIG 60 11/29/2016 0508   HDL 53 11/29/2016 0508   CHOLHDL 3.5 11/29/2016 0508   VLDL 12 11/29/2016 0508   LDLCALC 122 (H) 11/29/2016 0508     LDLDIRECT 143.1 06/16/2013 1510    Additional studies/ records that were reviewed today include:  2D ECHO: 11/29/2016 LV EF: 60% -   65% Study Conclusions - Left ventricle: The cavity size was normal. Systolic function was   normal. The estimated ejection fraction was in the range of 60%   to 65%. Wall motion was normal; there were no regional wall   motion abnormalities. Doppler  parameters are consistent with   abnormal left ventricular relaxation (grade 1 diastolic   dysfunction). Doppler parameters are consistent with   indeterminate ventricular filling pressure. - Aortic valve: There was mild regurgitation. Regurgitation   pressure half-time: 608 ms. - Mitral valve: Transvalvular velocity was within the normal range.   There was no evidence for stenosis. There was trivial   regurgitation. - Right ventricle: The cavity size was normal. Wall thickness was   normal. Systolic function was normal. - Atrial septum: No defect or patent foramen ovale was identified. - Tricuspid valve: There was mild regurgitation. - Pulmonic valve: There was moderate regurgitation. - Pulmonary arteries: Systolic pressure was within the normal   range. PA peak pressure: 36 mm Hg (S).    11/29/16 Left Heart Cath and Coronary Angiography  Conclusion    Prox RCA lesion, 70 %stenosed.  The left ventricular ejection fraction is 50-55% by visual estimate.  LV end diastolic pressure is mildly elevated.  There is mild left ventricular systolic dysfunction.   1. Moderate stenosis of a small to medium caliber nondominant RCA 2. Minimal irregularity of the LAD and left circumflex 3. Mild segmental LV systolic dysfunction with hypokinesis of the distal anterolateral wall, estimated LVEF 50-55% 4. Mildly elevated LV EDP  Recommendation: Medical therapy for nonobstructive CAD       ASSESSMENT & PLAN:   Chronic diastolic CHF: had mildly elevated LVEPD at cath. Discharged on home HCTZ. Still  SOB. Does not appear volume overloaded. Will check a BMET and BNP to rule out occult CHF. If significantly elevated, will change HCTZ to lasix 40mg  daily  HTN: BP well controlled.   Ongoing dyspnea: wonders why she cannot breath the way she used to. Will get PFTs to rule out lung disease.  Non obst CAD: continue ASA and statin   Fatigue: will order TSH  Medication Adjustments/Labs and Tests Ordered: Current medicines are reviewed at length with the patient today.  Concerns regarding medicines are outlined above.  Medication changes, Labs and Tests ordered today are listed in the Patient Instructions below. Patient Instructions  Medication Instructions:  Your physician recommends that you continue on your current medications as directed. Please refer to the Current Medication list given to you today.   Labwork: TODAY:  BMET & PRO BNP  Testing/Procedures: Your physician has recommended that you have a pulmonary function test. Pulmonary Function Tests are a group of tests that measure how well air moves in and out of your lungs.    Follow-Up: Your physician recommends that you schedule a follow-up appointment in: 3 MONTHS WITH DR. Marlou Porch    Any Other Special Instructions Will Be Listed Below (If Applicable).   Pulmonary Function Tests Pulmonary function tests (PFTs) are used to measure how well your lungs work, find out what is causing your lung problems, and figure out the best treatment for you. You may have PFTs:  When you have an illness involving the lungs.  To follow changes in your lung function over time if you have a chronic lung disease.  If you are an Nature conservation officer. This checks the effects of being exposed to chemicals over a long period of time.  To check lung function before having surgery or other procedures.  To check your lungs if you smoke.  To check if prescribed medicines or treatments are helping your lungs.  Your results will be compared to  the expected lung function of someone with healthy lungs who is similar to you in:  Age.  Gender.  Height.  Weight.  Race or ethnicity.  This is done to show how your lungs compare to normal lung function (percent predicted). This is how your health care provider knows if your lung function is normal or not. If you have had PFTs done before, your health care provider will compare your current results with past results. This shows if your lung function is better, worse, or the same as before. Tell a health care provider about:  Any allergies you have.  All medicines you are taking, including inhaler or nebulizer medicines, vitamins, herbs, eye drops, creams, and over-the-counter medicines.  Any blood disorders you have.  Any surgeries you have had, especially recent eye surgery, abdominal surgery, or chest surgery. These can make PFTs difficult or unsafe.  Any medical conditions you have, including chest pain or heart problems, tuberculosis, or respiratory infections such as pneumonia, a cold, or the flu.  Any fear of being in closed spaces (claustrophobia). Some of your tests may be in a closed space. What are the risks? Generally, this is a safe procedure. However, problems may occur, including:  Light-headedness due to over-breathing (hyperventilation).  An asthma attack from deep breathing.  A collapsed lung.  What happens before the procedure?  Take over-the-counter and prescription medicines only as told by your health care provider. If you take inhaler or nebulizer medicines, ask your health care provider which medicines you should take on the day of your testing. Some inhaler medicines may interfere with PFTs if they are taken shortly before the tests.  Follow your health care provider's instructions on eating and drinking restrictions. This may include avoiding eating large meals and drinking alcohol before the testing.  Do not use any products that contain nicotine  or tobacco, such as cigarettes and e-cigarettes. If you need help quitting, ask your health care provider.  Wear comfortable clothing that will not interfere with breathing. What happens during the procedure?  You will be given a soft nose clip to wear. This is done so all of your breaths will go through your mouth instead of your nose.  You will be given a germ-free (sterile) mouthpiece. It will be attached to a machine that measures your breathing (spirometer).  You will be asked to do various breathing maneuvers. The maneuvers will be done by breathing in (inhaling) and breathing out (exhaling). You may be asked to repeat the maneuvers several times before the testing is done.  It is important to follow the instructions exactly to get accurate results. Make sure to blow as hard and as fast as you can when you are told to do so.  You may be given a medicine that makes the small air passages in your lungs larger (bronchodilator) after testing has been done. This medicine will make it easier for you to breathe.  The tests will be repeated after the bronchodilator has taken effect.  You will be monitored carefully during the procedure for faintness, dizziness, trouble breathing, or any other problems. The procedure may vary among health care providers and hospitals. What happens after the procedure?  It is up to you to get your test results. Ask your health care provider, or the department that is doing the tests, when your results will be ready. After you have received your test results, talk with your health care provider about treatment options, if necessary. Summary  Pulmonary function tests (PFTs) are used to measure how well your lungs work, find out what is causing your  lung problems, and figure out the best treatment for you.  Wear comfortable clothing that will not interfere with breathing.  It is up to you to get your test results. After you have received them, talk with your  health care provider about treatment options, if necessary. This information is not intended to replace advice given to you by your health care provider. Make sure you discuss any questions you have with your health care provider. Document Released: 01/18/2004 Document Revised: 04/18/2016 Document Reviewed: 04/18/2016 Elsevier Interactive Patient Education  2017 [employer].    If you need a refill on your cardiac medications before your next appointment, please call your pharmacy.      Signed, Angelena Form, PA-C  12/19/2016 1:58 PM    [facility] [address], [place], [place]  [address] Phone: [phone]; Fax: [phone]

## 2016-12-19 ENCOUNTER — Ambulatory Visit (INDEPENDENT_AMBULATORY_CARE_PROVIDER_SITE_OTHER): Payer: Medicare HMO | Admitting: Physician Assistant

## 2016-12-19 ENCOUNTER — Encounter: Payer: Self-pay | Admitting: Physician Assistant

## 2016-12-19 VITALS — BP 138/70 | HR 62 | Ht 61.5 in | Wt 149.0 lb

## 2016-12-19 DIAGNOSIS — R5383 Other fatigue: Secondary | ICD-10-CM

## 2016-12-19 DIAGNOSIS — I5032 Chronic diastolic (congestive) heart failure: Secondary | ICD-10-CM

## 2016-12-19 DIAGNOSIS — R0600 Dyspnea, unspecified: Secondary | ICD-10-CM

## 2016-12-19 DIAGNOSIS — I10 Essential (primary) hypertension: Secondary | ICD-10-CM | POA: Diagnosis not present

## 2016-12-19 NOTE — Patient Instructions (Signed)
Medication Instructions:  Your physician recommends that you continue on your current medications as directed. Please refer to the Current Medication list given to you today.   Labwork: TODAY:  BMET & PRO BNP  Testing/Procedures: Your physician has recommended that you have a pulmonary function test. Pulmonary Function Tests are a group of tests that measure how well air moves in and out of your lungs.    Follow-Up: Your physician recommends that you schedule a follow-up appointment in: 3 MONTHS WITH DR. Marlou Porch    Any Other Special Instructions Will Be Listed Below (If Applicable).   Pulmonary Function Tests Pulmonary function tests (PFTs) are used to measure how well your lungs work, find out what is causing your lung problems, and figure out the best treatment for you. You may have PFTs:  When you have an illness involving the lungs.  To follow changes in your lung function over time if you have a chronic lung disease.  If you are an Nature conservation officer. This checks the effects of being exposed to chemicals over a long period of time.  To check lung function before having surgery or other procedures.  To check your lungs if you smoke.  To check if prescribed medicines or treatments are helping your lungs.  Your results will be compared to the expected lung function of someone with healthy lungs who is similar to you in:  Age.  Gender.  Height.  Weight.  Race or ethnicity.  This is done to show how your lungs compare to normal lung function (percent predicted). This is how your health care provider knows if your lung function is normal or not. If you have had PFTs done before, your health care provider will compare your current results with past results. This shows if your lung function is better, worse, or the same as before. Tell a health care provider about:  Any allergies you have.  All medicines you are taking, including inhaler or nebulizer medicines,  vitamins, herbs, eye drops, creams, and over-the-counter medicines.  Any blood disorders you have.  Any surgeries you have had, especially recent eye surgery, abdominal surgery, or chest surgery. These can make PFTs difficult or unsafe.  Any medical conditions you have, including chest pain or heart problems, tuberculosis, or respiratory infections such as pneumonia, a cold, or the flu.  Any fear of being in closed spaces (claustrophobia). Some of your tests may be in a closed space. What are the risks? Generally, this is a safe procedure. However, problems may occur, including:  Light-headedness due to over-breathing (hyperventilation).  An asthma attack from deep breathing.  A collapsed lung.  What happens before the procedure?  Take over-the-counter and prescription medicines only as told by your health care provider. If you take inhaler or nebulizer medicines, ask your health care provider which medicines you should take on the day of your testing. Some inhaler medicines may interfere with PFTs if they are taken shortly before the tests.  Follow your health care provider's instructions on eating and drinking restrictions. This may include avoiding eating large meals and drinking alcohol before the testing.  Do not use any products that contain nicotine or tobacco, such as cigarettes and e-cigarettes. If you need help quitting, ask your health care provider.  Wear comfortable clothing that will not interfere with breathing. What happens during the procedure?  You will be given a soft nose clip to wear. This is done so all of your breaths will go through your mouth instead  of your nose.  You will be given a germ-free (sterile) mouthpiece. It will be attached to a machine that measures your breathing (spirometer).  You will be asked to do various breathing maneuvers. The maneuvers will be done by breathing in (inhaling) and breathing out (exhaling). You may be asked to repeat the  maneuvers several times before the testing is done.  It is important to follow the instructions exactly to get accurate results. Make sure to blow as hard and as fast as you can when you are told to do so.  You may be given a medicine that makes the small air passages in your lungs larger (bronchodilator) after testing has been done. This medicine will make it easier for you to breathe.  The tests will be repeated after the bronchodilator has taken effect.  You will be monitored carefully during the procedure for faintness, dizziness, trouble breathing, or any other problems. The procedure may vary among health care providers and hospitals. What happens after the procedure?  It is up to you to get your test results. Ask your health care provider, or the department that is doing the tests, when your results will be ready. After you have received your test results, talk with your health care provider about treatment options, if necessary. Summary  Pulmonary function tests (PFTs) are used to measure how well your lungs work, find out what is causing your lung problems, and figure out the best treatment for you.  Wear comfortable clothing that will not interfere with breathing.  It is up to you to get your test results. After you have received them, talk with your health care provider about treatment options, if necessary. This information is not intended to replace advice given to you by your health care provider. Make sure you discuss any questions you have with your health care provider. Document Released: 01/18/2004 Document Revised: 04/18/2016 Document Reviewed: 04/18/2016 Elsevier Interactive Patient Education  2017 [employer].    If you need a refill on your cardiac medications before your next appointment, please call your pharmacy.

## 2016-12-20 LAB — BASIC METABOLIC PANEL
BUN / CREAT RATIO: 13 (ref 12–28)
BUN: 12 mg/dL (ref 8–27)
CHLORIDE: 100 mmol/L (ref 96–106)
CO2: 24 mmol/L (ref 20–29)
CREATININE: 0.92 mg/dL (ref 0.57–1.00)
Calcium: 10.2 mg/dL (ref 8.7–10.3)
GFR calc Af Amer: 67 mL/min/{1.73_m2} (ref >59)
GFR calc non Af Amer: 58 mL/min/{1.73_m2} — ABNORMAL LOW (ref >59)
GLUCOSE: 97 mg/dL (ref 65–99)
POTASSIUM: 4.4 mmol/L (ref 3.5–5.2)
SODIUM: 143 mmol/L (ref 134–144)

## 2016-12-20 LAB — TSH: TSH: 1.38 u[IU]/mL (ref 0.450–4.500)

## 2016-12-20 LAB — PRO B NATRIURETIC PEPTIDE: NT-PRO BNP: 185 pg/mL (ref 0–738)

## 2016-12-24 ENCOUNTER — Ambulatory Visit (INDEPENDENT_AMBULATORY_CARE_PROVIDER_SITE_OTHER): Payer: Medicare HMO | Admitting: Internal Medicine

## 2016-12-24 DIAGNOSIS — R0600 Dyspnea, unspecified: Secondary | ICD-10-CM

## 2016-12-24 NOTE — Progress Notes (Signed)
PFT was attempted today. Pt could not complete the test due to being unable to perform the proper technique. I have called Cardiology to make them aware of this.

## 2016-12-26 ENCOUNTER — Other Ambulatory Visit: Payer: Medicare HMO

## 2016-12-30 ENCOUNTER — Ambulatory Visit
Admission: RE | Admit: 2016-12-30 | Discharge: 2016-12-30 | Disposition: A | Discharge status: Home / Self Care | Payer: Medicare HMO | Source: Ambulatory Visit | Attending: Family Medicine | Admitting: Family Medicine

## 2016-12-30 DIAGNOSIS — E2839 Other primary ovarian failure: Secondary | ICD-10-CM

## 2016-12-30 DIAGNOSIS — M810 Age-related osteoporosis without current pathological fracture: Secondary | ICD-10-CM | POA: Diagnosis not present

## 2016-12-30 DIAGNOSIS — Z780 Asymptomatic menopausal state: Secondary | ICD-10-CM | POA: Diagnosis not present

## 2017-01-14 ENCOUNTER — Ambulatory Visit (INDEPENDENT_AMBULATORY_CARE_PROVIDER_SITE_OTHER): Payer: Medicare HMO | Admitting: Family Medicine

## 2017-01-14 ENCOUNTER — Encounter: Payer: Self-pay | Admitting: Family Medicine

## 2017-01-14 VITALS — BP 118/68 | HR 52 | Temp 98.1°F | Ht 61.5 in | Wt 148.5 lb

## 2017-01-14 DIAGNOSIS — R1013 Epigastric pain: Secondary | ICD-10-CM

## 2017-01-14 DIAGNOSIS — M810 Age-related osteoporosis without current pathological fracture: Secondary | ICD-10-CM

## 2017-01-14 DIAGNOSIS — I2510 Atherosclerotic heart disease of native coronary artery without angina pectoris: Secondary | ICD-10-CM

## 2017-01-14 DIAGNOSIS — I5189 Other ill-defined heart diseases: Secondary | ICD-10-CM

## 2017-01-14 DIAGNOSIS — I519 Heart disease, unspecified: Secondary | ICD-10-CM | POA: Diagnosis not present

## 2017-01-14 MED ORDER — ALENDRONATE SODIUM 70 MG PO TABS: 70.0000 mg | ORAL_TABLET | ORAL | 11 refills | Status: DC

## 2017-01-14 NOTE — Assessment & Plan Note (Signed)
T score -3.0 in forearm dexa 7/18  Disc ca and D and exercise No falls or fx fam hx of hip fx in mother  Disc tx options Will try alendronate 70 mg weekly if well tolerated Disc side eff- if GI/heartburn or swallowing issues call immediately  Handouts given  Disc fall precaution

## 2017-01-14 NOTE — Patient Instructions (Addendum)
Try to get 1200-1500 mg of calcium per day with at least 1000 iu of vitamin D - for bone health   Take fosamax as directed once weekly  If it causes any stomach or heartburn or throat side effects (including problems swallowing)- then stop it and let us know! (we will try something else)  If it works out we will keep you on it 5 years We need to hold for any big dental proceedures

## 2017-01-14 NOTE — Assessment & Plan Note (Signed)
Non obstructive Seen by cardiology On asa and statin

## 2017-01-14 NOTE — Assessment & Plan Note (Signed)
Improved with zantac

## 2017-01-14 NOTE — Progress Notes (Signed)
Subjective:    Patient ID: Sara [name], female    DOB: [birth year], 83 y.o.   MRN: [id number]  HPI Here for rev of dexa and she also had questions about her heart hx (rev her cardiology f/u today)   dexa 12/30/16  Forearm T score -3.0 Dual femur total right -2.2 In 2004 FN T score was -2.3  She is post menopausal  Hx of hysterectomy  No fractures  On zantac Takes ca and D  Family hx - mother fell and broke her hip / was elderly   No hx of fracture  No falls at all   Is careful not to fall  Does not climb or walk on ice  Does not wear high heels   She takes ca and D  Walks for exercise   May have 1/2 inch ht loss   Posture is generally good   Hx of D def -is supplementing now   She wanted to rev cardiac hx  Rev her L cath and echo  Hx of diastolic dysfunction and also non obst CAD No cp  Some sob - ? Due to deconditioning -pt says she was checked by cardiology and tried to do pulm fxn tests    Patient Active Problem List   Diagnosis Date Noted  . Diastolic dysfunction 25/10/3974  . Estrogen deficiency 12/16/2016  . Routine general medical examination at a health care facility 12/16/2016  . CAD (coronary artery disease) 12/04/2016  . Elevated troponin   . Dyspnea on exertion 11/28/2016  . Dyspepsia 03/15/2016  . 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 Eczema 03/06/2015  . Rash and nonspecific skin eruption 04/15/2014  . Hyperglycemia 02/11/2014  . Intertrigo labialis 12/07/2012  . CONSTIPATION, CHRONIC 07/03/2010  . Vitamin D deficiency 09/22/2008  . Osteoporosis 06/24/2008  . HYPERCHOLESTEROLEMIA 12/08/2006  . Essential hypertension 12/08/2006  . GERD 12/08/2006  . SPINAL STENOSIS 12/08/2006   Past Medical History:  Diagnosis Date  . Allergy   . CAD (coronary artery disease)   . Clinical trial participant    [facility] studies in familial dementia  . Dyspnea 11/28/2016  . Frequent UTI   . GERD (gastroesophageal reflux disease)   .  Hypertension   . Osteoarthritis    hips, back  . Osteopenia   . Pneumonia   . Spinal stenosis   . Stroke [facility]) 1985   Past Surgical History:  Procedure Laterality Date  . ABDOMINAL HYSTERECTOMY     partial has one ovary  . APPENDECTOMY    . CATARACT EXTRACTION Bilateral   . LEFT HEART CATH AND CORONARY ANGIOGRAPHY N/A 11/29/2016   Procedure: Left Heart Cath and Coronary Angiography;  Surgeon: Sherren Mocha, MD;  Location: [place] [facility];  Service: Cardiovascular;  Laterality: N/A;  . LUMBAR DISC SURGERY     x 3  . rotator cuff surgery Right 2010   Social History  Substance Use Topics  . Smoking status: Never Smoker  . Smokeless tobacco: Never Used  . Alcohol use No   Family History  Problem Relation Age of Onset  . Lung cancer Brother   . Other Mother        kidney tumor  . Hypertension Mother   . Diabetes Sister   . Diabetes Brother   . Diabetes Sister        x 2  . Colon cancer Maternal Uncle   . Other Sister        intestine burst  . Breast cancer  Maternal Aunt        x 2  . Breast cancer Daughter   . Diabetes Son        x 3  . Diabetes Daughter   . Esophageal cancer Neg Hx   . Rectal cancer Neg Hx   . Stomach cancer Neg Hx    Allergies  Allergen Reactions  . Amoxicillin     Reaction not known  . Cefpodoxime Proxetil     Reaction not known  . Clarithromycin     Reaction not known  . Furosemide     REACTION: doesn't help  . Gabapentin Swelling    Caused severe swellling   Current Outpatient Prescriptions on File Prior to Visit  Medication Sig Dispense Refill  . aspirin EC 81 MG tablet Take 81 mg by mouth daily.      Marland Kitchen atorvastatin (LIPITOR) 10 MG tablet Take 1 tablet (10 mg total) by mouth daily. 90 tablet 3  . CARTIA XT 120 MG 24 hr capsule TAKE 1 CAPSULE EVERY DAY 90 capsule 1  . Cholecalciferol (VITAMIN D-3) 1000 UNITS CAPS Take 1,000 Units by mouth daily.     Marland Kitchen diltiazem (TIAZAC) 120 MG 24 hr capsule TAKE 1 CAPSULE EVERY DAY 90 capsule 0    . Docusate Sodium (EQ STOOL SOFTENER PO) Take 100 mg by mouth daily as needed (constipation).     Marland Kitchen doxylamine, Sleep, (EQ SLEEP AID) 25 MG tablet Take 25 mg by mouth at bedtime as needed for sleep.     . hydrochlorothiazide (HYDRODIURIL) 25 MG tablet TAKE 1 TABLET EVERY DAY 90 tablet 1  . nitroGLYCERIN (NITROSTAT) 0.4 MG SL tablet Place 1 tablet (0.4 mg total) under the tongue every 5 (five) minutes as needed for chest pain (max 3 doses in 15 minutes). 25 tablet 1  . polyethylene glycol (MIRALAX / GLYCOLAX) packet Take 17 g by mouth daily.     . potassium chloride (K-DUR,KLOR-CON) 10 MEQ tablet TAKE 1 TABLET EVERY DAY 90 tablet 1  . ranitidine (ZANTAC) 150 MG capsule Take 1 capsule (150 mg total) by mouth 2 (two) times daily. 60 capsule 11   No current facility-administered medications on file prior to visit.      Review of Systems Review of Systems  Constitutional: Negative for fever, appetite change, fatigue and unexpected weight change.  Eyes: Negative for pain and visual disturbance.  Respiratory: Negative for cough and pos baseline  shortness of breath.(seen cardiology and had PFT)   Cardiovascular: Negative for cp or palpitations    Gastrointestinal: Negative for nausea, diarrhea and constipation. (pos for prev dyspepsia that was improved with zantac) Genitourinary: Negative for urgency and frequency.  Skin: Negative for pallor or rash   MSK pos for hip pain intermittent Neurological: Negative for weakness, light-headedness, numbness and headaches.  Hematological: Negative for adenopathy. Does not bruise/bleed easily.  Psychiatric/Behavioral: Negative for dysphoric mood. The patient is not nervous/anxious.         Objective:   Physical Exam  Constitutional: She appears well-developed and well-nourished. No distress.  Well appearing elderly female (looks younger than stated age)  HENT:  Head: Normocephalic and atraumatic.  Mouth/Throat: Oropharynx is clear and moist.  Eyes:  Pupils are equal, round, and reactive to light. Conjunctivae and EOM are normal. Right eye exhibits no discharge. Left eye exhibits no discharge. No scleral icterus.  Neck: Normal range of motion. Neck supple. Carotid bruit is not present. No thyromegaly present.  Cardiovascular: Normal rate and regular rhythm.  Musculoskeletal: She exhibits no edema or tenderness.  No kyphosis  No spinal tenderness Frame is medium (not petite)  Lymphadenopathy:    She has no cervical adenopathy.  Neurological: She is alert.  Skin: Skin is warm and dry. No pallor.  Psychiatric: She has a normal mood and affect.          Assessment & Plan:   Problem List Items Addressed This Visit      Cardiovascular and Mediastinum   CAD (coronary artery disease)    Non obstructive Seen by cardiology On asa and statin         Musculoskeletal and Integument   Osteoporosis - Primary    T score -3.0 in forearm dexa 7/18  Disc ca and D and exercise No falls or fx fam hx of hip fx in mother  Disc tx options Will try alendronate 70 mg weekly if well tolerated Disc side eff- if GI/heartburn or swallowing issues call immediately  Handouts given  Disc fall precaution       Relevant Medications   alendronate (FOSAMAX) 70 MG tablet     Other   Diastolic dysfunction   Dyspepsia    Improved with zantac

## 2017-03-04 ENCOUNTER — Other Ambulatory Visit: Payer: Self-pay | Admitting: *Deleted

## 2017-03-04 MED ORDER — ATORVASTATIN CALCIUM 10 MG PO TABS: 10.0000 mg | ORAL_TABLET | Freq: Every day | ORAL | 3 refills | Status: DC

## 2017-03-04 NOTE — Telephone Encounter (Signed)
Pt changed to mail order pharmacy

## 2017-03-11 ENCOUNTER — Ambulatory Visit (INDEPENDENT_AMBULATORY_CARE_PROVIDER_SITE_OTHER): Payer: Medicare HMO | Admitting: Family Medicine

## 2017-03-11 ENCOUNTER — Encounter: Payer: Self-pay | Admitting: Family Medicine

## 2017-03-11 VITALS — BP 118/72 | HR 57 | Temp 98.3°F | Ht 61.5 in | Wt 148.5 lb

## 2017-03-11 DIAGNOSIS — G8929 Other chronic pain: Secondary | ICD-10-CM | POA: Diagnosis not present

## 2017-03-11 DIAGNOSIS — M810 Age-related osteoporosis without current pathological fracture: Secondary | ICD-10-CM | POA: Diagnosis not present

## 2017-03-11 DIAGNOSIS — B029 Zoster without complications: Secondary | ICD-10-CM | POA: Diagnosis not present

## 2017-03-11 DIAGNOSIS — Z23 Encounter for immunization: Secondary | ICD-10-CM

## 2017-03-11 DIAGNOSIS — M545 Low back pain, unspecified: Secondary | ICD-10-CM

## 2017-03-11 DIAGNOSIS — M48061 Spinal stenosis, lumbar region without neurogenic claudication: Secondary | ICD-10-CM | POA: Insufficient documentation

## 2017-03-11 NOTE — Progress Notes (Signed)
Subjective:    Patient ID: Sara [name], female    DOB: Nov 29, 1931, 83 y.o.   MRN: [id number]  HPI  Here for several complaints  Leg pain left -pain shoots down her L leg from her L back  R side not as bad  occ arms  Ever since she started the fosamax -so she stopped it  Is improved now - a little when moving around    Flank pain left - started before the leg  That is improved also since stopping fosamax (2 wk ago)  Hurts to walk occasional No urinary symptoms at all    Rash on R breast - red spots  (2 clusters)  Itchy and a little uncomfortable -no deep ache  No weeping and no blisters  Mammogram nl 4/18  Used blue star and then antibiotic oint otc  Wt Readings from Last 3 Encounters:  03/11/17 148 lb 8 oz (67.4 kg)  01/14/17 148 lb 8 oz (67.4 kg)  12/19/16 149 lb (67.6 kg)   27.60 kg/m     Review of Systems     Objective:   Physical Exam  Constitutional: She appears well-developed and well-nourished. No distress.  HENT:  Head: Normocephalic and atraumatic.  Eyes: Pupils are equal, round, and reactive to light. Conjunctivae and EOM are normal. No scleral icterus.  Neck: Normal range of motion. Neck supple.  Cardiovascular: Normal rate and regular rhythm.   Pulmonary/Chest: Effort normal and breath sounds normal. She has no wheezes. She has no rales.  Abdominal: Soft. Bowel sounds are normal. She exhibits no distension. There is no tenderness.  Musculoskeletal: She exhibits tenderness.       Right shoulder: She exhibits decreased range of motion, tenderness, bony tenderness and spasm. She exhibits no swelling, no effusion, no crepitus, no deformity, normal pulse and normal strength.       Lumbar back: She exhibits decreased range of motion, tenderness and spasm. She exhibits no bony tenderness and no edema.  Tender over LS  No cva tenderness but musculature of L flank is tender and tight  Pain to flex/ ext and L lateral flex Nl gait  No focal  neuro s/s  Neg SLR  Lymphadenopathy:    She has no cervical adenopathy.  Neurological: She is alert. She has normal strength and normal reflexes. She displays no atrophy. No cranial nerve deficit or sensory deficit. She exhibits normal muscle tone. Coordination normal.  Negative SLR  Skin: Skin is warm and dry. Rash noted. No erythema. No pallor.  2 small areas of vesicles on anterior R breast that are starting to dry up  No drainage Scant erythema  Not overly tender  Psychiatric: She has a normal mood and affect.          Assessment & Plan:   Problem List Items Addressed This Visit      Musculoskeletal and Integument   Osteoporosis    Intolerant of fosamax/ causing body pain  Pt stopped it         Other   Left low back pain    L low back and flank pain are likely due to spinal stenosis/ movement related  Disc stretching and walking No acute neuro symptoms  Suggested PT - pt is thinking about it and will let us know if she wants a referral       Shingles    Small area on R breast- 2 weeks  Suspect it will fade soon/ not very symptomatic  Will keep  clean with soap and water - alert if itch or pain worsen  Would consider shingrix vaccine when it is available if she can afford it        Other Visit Diagnoses    Need for influenza vaccination    -  Primary   Relevant Orders   Flu Vaccine QUAD 6+ mos PF IM (Fluarix Quad PF) (Completed)

## 2017-03-11 NOTE — Patient Instructions (Addendum)
I think you have a very mild shingles outbreak on your right breast  It should be improving since you have had it for 2 weeks Wash with soap and water (keep clean)  A cool compress may help discomfort or itching  Update if not starting to improve more in a week or if worsening    I think your side and leg pain are likely from your back  Use heat when needed Keep walking  Stretch  If you want to try physical therapy let me know   Stay off the fosamax since it made you hurt

## 2017-03-12 NOTE — Assessment & Plan Note (Signed)
L low back and flank pain are likely due to spinal stenosis/ movement related  Disc stretching and walking No acute neuro symptoms  Suggested PT - pt is thinking about it and will let us know if she wants a referral

## 2017-03-12 NOTE — Assessment & Plan Note (Signed)
Small area on R breast- 2 weeks  Suspect it will fade soon/ not very symptomatic  Will keep clean with soap and water - alert if itch or pain worsen  Would consider shingrix vaccine when it is available if she can afford it

## 2017-03-12 NOTE — Assessment & Plan Note (Signed)
Intolerant of fosamax/ causing body pain  Pt stopped it

## 2017-03-24 ENCOUNTER — Telehealth: Payer: Self-pay

## 2017-03-24 NOTE — Telephone Encounter (Signed)
Pt left v/m; pt is not taking fosamax but pt thinks atorvastatin is causing pt to hurt all over including the joints; pt is nauseated and burping a lot. [facility].pt request cb. Last seen 03/11/17.

## 2017-03-24 NOTE — Telephone Encounter (Signed)
Go ahead and stop it  Let me know in about 1-2 weeks how she is feeling  Thanks

## 2017-03-24 NOTE — Telephone Encounter (Signed)
Patient notified as instructed by telephone and verbalized understanding. Patient stated that she never took the Fosamax because she read the side effects. Patient stated that she stopped the Atorvastatin about a week ago and will let Dr. Glori Bickers know in the next 1-2 week how she is feeling.

## 2017-03-31 ENCOUNTER — Ambulatory Visit: Payer: Medicare HMO | Admitting: Cardiology

## 2017-05-02 ENCOUNTER — Other Ambulatory Visit: Payer: Self-pay | Admitting: Family Medicine

## 2017-06-16 NOTE — Progress Notes (Signed)
Cardiology Office Note:    Date:  06/17/2017   ID:  Sara [name], DOB 05-06-1932, MRN [id number]  PCP:  Abner Greenspan, MD  Cardiologist:  Candee Furbish, MD    Referring MD: Abner Greenspan, MD     History of Present Illness:    Sara [name] is a 84 y.o. female with minimal CAD, no PCI/intervention with mild anterolateral wall hypokinesis, LVEDP of 18 mmHg on heart catheterization 07/19/45, chronic diastolic heart failure, hypertension, GERD, non-smoker seen previously in the hospital on 11/28/16 here for clinic follow-up.  She had a minimal elevation in troponin during that hospitalization.  No prior history of MI, mother lived to age 56.  Ventricular bigeminy was noted on telemetry during the hospital stay creating an effective pulse rate in the 40s.  Lasix was listed as a allergy on her medications.  She is not recalling the reason why she is allergic to Lasix.  She is on hydrochlorothiazide  Her daughter was intolerant to Lipitor.  She also stopped her atorvastatin back on early October 2018.  She could not tolerate PFTs.  A little better.     Past Medical History:  Diagnosis Date  . Allergy   . CAD (coronary artery disease)   . Clinical trial participant    [facility] studies in familial dementia  . Dyspnea 11/28/2016  . Frequent UTI   . GERD (gastroesophageal reflux disease)   . Hypertension   . Osteoarthritis    hips, back  . Osteopenia   . Pneumonia   . Spinal stenosis   . Stroke [facility]) 1985    Past Surgical History:  Procedure Laterality Date  . ABDOMINAL HYSTERECTOMY     partial has one ovary  . APPENDECTOMY    . CATARACT EXTRACTION Bilateral   . LEFT HEART CATH AND CORONARY ANGIOGRAPHY N/A 11/29/2016   Procedure: Left Heart Cath and Coronary Angiography;  Surgeon: Sherren Mocha, MD;  Location: [place] [facility];  Service: Cardiovascular;  Laterality: N/A;  . LUMBAR DISC SURGERY     x 3  . rotator cuff surgery Right 2010     Current Medications: Current Meds  Medication Sig  . aspirin EC 81 MG tablet Take 81 mg by mouth daily.    Marland Kitchen atorvastatin (LIPITOR) 10 MG tablet Take 1 tablet (10 mg total) by mouth daily.  . Cholecalciferol (VITAMIN D-3) 1000 UNITS CAPS Take 1,000 Units by mouth daily.   Marland Kitchen diltiazem (CARDIZEM CD) 120 MG 24 hr capsule Take 120 mg by mouth daily.  Sara [name] Sodium (EQ STOOL SOFTENER PO) Take 100 mg by mouth daily as needed (constipation).   Marland Kitchen doxylamine, Sleep, (EQ SLEEP AID) 25 MG tablet Take 25 mg by mouth at bedtime as needed for sleep.   . hydrochlorothiazide (HYDRODIURIL) 25 MG tablet Take 25 mg by mouth daily.  . nitroGLYCERIN (NITROSTAT) 0.4 MG SL tablet Place 1 tablet (0.4 mg total) under the tongue every 5 (five) minutes as needed for chest pain (max 3 doses in 15 minutes).  . polyethylene glycol (MIRALAX / GLYCOLAX) packet Take 17 g by mouth daily.   . potassium chloride (K-DUR,KLOR-CON) 10 MEQ tablet Take 10 mEq by mouth daily.  . ranitidine (ZANTAC) 150 MG capsule Take 1 capsule (150 mg total) by mouth 2 (two) times daily.     Allergies:   Amoxicillin; Cefpodoxime proxetil; Clarithromycin; Fosamax [alendronate sodium]; Furosemide; and Gabapentin   Social History   Socioeconomic History  . Marital status: Married  Spouse name: None  . Number of children: 6  . Years of education: None  . Highest education level: None  Social Needs  . Financial resource strain: None  . Food insecurity - worry: None  . Food insecurity - inability: None  . Transportation needs - medical: None  . Transportation needs - non-medical: None  Occupational History  . Occupation: RETIRED    Employer: RETIRED  Tobacco Use  . Smoking status: Never Smoker  . Smokeless tobacco: Never Used  Substance and Sexual Activity  . Alcohol use: No    Alcohol/week: 0.0 oz  . Drug use: No  . Sexual activity: None  Other Topics Concern  . None  Social History Narrative  . None     Family  History: The patient's family history includes Breast cancer in her daughter and maternal aunt; Colon cancer in her maternal uncle; Diabetes in her brother, daughter, sister, sister, and son; Hypertension in her mother; Lung cancer in her brother; Other in her mother and sister. There is no history of Esophageal cancer, Rectal cancer, or Stomach cancer. ROS:   Please see the history of present illness.     All other systems reviewed and are negative.  EKGs/Labs/Other Studies Reviewed:    The following studies were reviewed today:  LHC on 11/29/16 which showed moderate stenosis of small to medium caliber non dominant RCA (70%) and minimal irregularities of left system, EF 50-55% with mild segmental LV dysfunction of distal anterolateral wall and mildly elevated filling pressures. Medical therapy was recommended.   2D ECHO 11/29/16 showed EF 60-65% with no WMAs, G1DD, mild AR, mild TR, mod PR, PA pressure 36.     EKG:  EKG is not ordered today.   Recent Labs: 11/28/2016: ALT 13; B Natriuretic Peptide 325.4 12/04/2016: Hemoglobin 14.3; Platelets 286.0 12/19/2016: BUN 12; Creatinine, Ser 0.92; NT-Pro BNP 185; Potassium 4.4; Sodium 143; TSH 1.380  Recent Lipid Panel    Component Value Date/Time   CHOL 187 11/29/2016 0508   TRIG 60 11/29/2016 0508   HDL 53 11/29/2016 0508   CHOLHDL 3.5 11/29/2016 0508   VLDL 12 11/29/2016 0508   LDLCALC 122 (H) 11/29/2016 0508   LDLDIRECT 143.1 06/16/2013 1510    Physical Exam:    VS:  BP 140/68   Pulse (!) 55   Ht 5\' 2"  (1.575 m)   Wt 153 lb 1.9 oz (69.5 kg)   BMI 28.01 kg/m     Wt Readings from Last 3 Encounters:  06/17/17 153 lb 1.9 oz (69.5 kg)  03/11/17 148 lb 8 oz (67.4 kg)  01/14/17 148 lb 8 oz (67.4 kg)     GEN:  Well nourished, well developed in no acute distress HEENT: Normal NECK: No JVD; No carotid bruits LYMPHATICS: No lymphadenopathy CARDIAC: RRR, no murmurs, rubs, gallops RESPIRATORY:  Clear to auscultation without rales,  wheezing or rhonchi  ABDOMEN: Soft, non-tender, non-distended MUSCULOSKELETAL:  No edema; No deformity  SKIN: Warm and dry NEUROLOGIC:  Alert and oriented x 3 PSYCHIATRIC:  Normal affect   ASSESSMENT:    1. Coronary artery disease involving native coronary artery of native heart without angina pectoris   2. Essential hypertension    PLAN:    In order of problems listed above:  Nonobstructive coronary artery disease -Cardiac catheterization reviewed.  Medical therapy.  Unfortunately she does not wish to take atorvastatin.  When asked about this today, she actually does not recall why she is not taking it.  I would encourage  use of this medication.  This may help stabilize some of her moderate coronary artery disease.  Dyspnea on exertion -Unfortunately could not comply with PFTs to get an accurate result.  Continue to work on conditioning.  She feels a little bit better she states.  We will see her back in 1 year.   Medication Adjustments/Labs and Tests Ordered: Current medicines are reviewed at length with the patient today.  Concerns regarding medicines are outlined above.  No orders of the defined types were placed in this encounter.  No orders of the defined types were placed in this encounter.   Signed, Candee Furbish, MD  06/17/2017 9:26 AM    [facility]

## 2017-06-17 ENCOUNTER — Encounter: Payer: Self-pay | Admitting: Cardiology

## 2017-06-17 ENCOUNTER — Ambulatory Visit: Payer: Medicare HMO | Admitting: Cardiology

## 2017-06-17 VITALS — BP 140/68 | HR 55 | Ht 62.0 in | Wt 153.1 lb

## 2017-06-17 DIAGNOSIS — I2510 Atherosclerotic heart disease of native coronary artery without angina pectoris: Secondary | ICD-10-CM

## 2017-06-17 DIAGNOSIS — I10 Essential (primary) hypertension: Secondary | ICD-10-CM

## 2017-06-17 NOTE — Patient Instructions (Signed)

## 2017-08-08 ENCOUNTER — Telehealth: Payer: Self-pay | Admitting: Family Medicine

## 2017-08-08 NOTE — Telephone Encounter (Signed)
Pt  Requesting  Lipitor  Discontinued- Pt  Stopped  Taking  Due to  Symptoms    [employer]  Still  Sending  Refills  Last Rx  Written 03/04/2017     See  Telephone  Encounter  03/24/2017

## 2017-08-08 NOTE — Telephone Encounter (Signed)
Med removed from med list and called [facility] and d/c from med list and confirmed it was d/c with pharmacist Joellen Jersey

## 2017-08-08 NOTE — Telephone Encounter (Signed)
Please see phone note 03/24/17; pt last annual 12/16/16.

## 2017-08-08 NOTE — Telephone Encounter (Signed)
Copied from [place] [phone]. Topic: Quick Communication - Rx Refill/Question >> Aug 08, 2017 11:02 AM Scherrie Gerlach wrote: Medication: atorvastatin (LIPITOR) 10 MG tablet  Pt states she is no longer taking this medication, but Mcarthur Rossetti keeps sending to her.  Pt states she has called [employer] several times, but they pay her no mind. Pt is requesting we call [employer] and tell them not to send anymore.  Pt still has refills on this med, and they will keep sending unless we let them know.

## 2017-08-08 NOTE — Telephone Encounter (Signed)
Please remove from list and fax or call pharmacy to stop sending Also add to med intol list if not already  Thanks

## 2017-09-15 DIAGNOSIS — H52223 Regular astigmatism, bilateral: Secondary | ICD-10-CM | POA: Diagnosis not present

## 2017-10-01 ENCOUNTER — Encounter: Payer: Self-pay | Admitting: Family Medicine

## 2017-10-01 DIAGNOSIS — Z1231 Encounter for screening mammogram for malignant neoplasm of breast: Secondary | ICD-10-CM | POA: Diagnosis not present

## 2017-10-01 DIAGNOSIS — Z803 Family history of malignant neoplasm of breast: Secondary | ICD-10-CM | POA: Diagnosis not present

## 2017-11-04 ENCOUNTER — Other Ambulatory Visit: Payer: Self-pay | Admitting: Family Medicine

## 2017-12-14 ENCOUNTER — Telehealth: Payer: Self-pay | Admitting: Family Medicine

## 2017-12-14 DIAGNOSIS — E559 Vitamin D deficiency, unspecified: Secondary | ICD-10-CM

## 2017-12-14 DIAGNOSIS — E7800 Pure hypercholesterolemia, unspecified: Secondary | ICD-10-CM

## 2017-12-14 DIAGNOSIS — R739 Hyperglycemia, unspecified: Secondary | ICD-10-CM

## 2017-12-14 DIAGNOSIS — M810 Age-related osteoporosis without current pathological fracture: Secondary | ICD-10-CM

## 2017-12-14 DIAGNOSIS — I10 Essential (primary) hypertension: Secondary | ICD-10-CM

## 2017-12-14 NOTE — Telephone Encounter (Signed)
-----   Message from Eustace Pen, LPN sent at 08/16/6884  4:05 PM EDT ----- Regarding: labs 7/11 Lab orders needed. Thank you.  Insurance:  [employer]

## 2017-12-18 ENCOUNTER — Ambulatory Visit (INDEPENDENT_AMBULATORY_CARE_PROVIDER_SITE_OTHER): Payer: Medicare HMO

## 2017-12-18 VITALS — BP 118/84 | HR 75 | Temp 99.0°F | Ht 62.5 in | Wt 151.5 lb

## 2017-12-18 DIAGNOSIS — E7800 Pure hypercholesterolemia, unspecified: Secondary | ICD-10-CM

## 2017-12-18 DIAGNOSIS — Z0000 Encounter for general adult medical examination without abnormal findings: Secondary | ICD-10-CM

## 2017-12-18 DIAGNOSIS — E559 Vitamin D deficiency, unspecified: Secondary | ICD-10-CM | POA: Diagnosis not present

## 2017-12-18 DIAGNOSIS — R739 Hyperglycemia, unspecified: Secondary | ICD-10-CM | POA: Diagnosis not present

## 2017-12-18 DIAGNOSIS — I10 Essential (primary) hypertension: Secondary | ICD-10-CM

## 2017-12-18 LAB — COMPREHENSIVE METABOLIC PANEL
ALK PHOS: 69 U/L (ref 39–117)
ALT: 13 U/L (ref 0–35)
AST: 18 U/L (ref 0–37)
Albumin: 4.1 g/dL (ref 3.5–5.2)
BILIRUBIN TOTAL: 0.7 mg/dL (ref 0.2–1.2)
BUN: 15 mg/dL (ref 6–23)
CALCIUM: 10.1 mg/dL (ref 8.4–10.5)
CO2: 30 mEq/L (ref 19–32)
Chloride: 103 mEq/L (ref 96–112)
Creatinine, Ser: 0.9 mg/dL (ref 0.40–1.20)
GFR: 76.78 mL/min (ref >60.00)
Glucose, Bld: 103 mg/dL — ABNORMAL HIGH (ref 70–99)
Potassium: 3.9 mEq/L (ref 3.5–5.1)
Sodium: 140 mEq/L (ref 135–145)
TOTAL PROTEIN: 7.2 g/dL (ref 6.0–8.3)

## 2017-12-18 LAB — CBC WITH DIFFERENTIAL/PLATELET
BASOS ABS: 0 10*3/uL (ref 0.0–0.1)
Basophils Relative: 0.7 % (ref 0.0–3.0)
Eosinophils Absolute: 0.1 10*3/uL (ref 0.0–0.7)
Eosinophils Relative: 1.1 % (ref 0.0–5.0)
HEMATOCRIT: 41 % (ref 36.0–46.0)
Hemoglobin: 13.4 g/dL (ref 12.0–15.0)
LYMPHS PCT: 36 % (ref 12.0–46.0)
Lymphs Abs: 2.1 10*3/uL (ref 0.7–4.0)
MCHC: 32.6 g/dL (ref 30.0–36.0)
MCV: 86.9 fl (ref 78.0–100.0)
Monocytes Absolute: 0.7 10*3/uL (ref 0.1–1.0)
Monocytes Relative: 11.2 % (ref 3.0–12.0)
NEUTROS PCT: 51 % (ref 43.0–77.0)
Neutro Abs: 3 10*3/uL (ref 1.4–7.7)
Platelets: 206 10*3/uL (ref 150.0–400.0)
RBC: 4.72 Mil/uL (ref 3.87–5.11)
RDW: 12.9 % (ref 11.5–15.5)
WBC: 5.8 10*3/uL (ref 4.0–10.5)

## 2017-12-18 LAB — LIPID PANEL
Cholesterol: 186 mg/dL (ref 0–200)
HDL: 53.7 mg/dL (ref >39.00)
LDL Cholesterol: 100 mg/dL — ABNORMAL HIGH (ref 0–99)
NonHDL: 132.44
TRIGLYCERIDES: 160 mg/dL — AB (ref 0.0–149.0)
Total CHOL/HDL Ratio: 3
VLDL: 32 mg/dL (ref 0.0–40.0)

## 2017-12-18 LAB — HEMOGLOBIN A1C: Hgb A1c MFr Bld: 6.2 % (ref 4.6–6.5)

## 2017-12-18 LAB — TSH: TSH: 1.45 u[IU]/mL (ref 0.35–4.50)

## 2017-12-18 LAB — VITAMIN D 25 HYDROXY (VIT D DEFICIENCY, FRACTURES): VITD: 46.46 ng/mL (ref 30.00–100.00)

## 2017-12-18 NOTE — Progress Notes (Signed)
PCP notes:   Health maintenance:  Tetanus vaccine - postponed  Abnormal screenings:   Mini-Cog score: 18/20 MMSE - Mini Mental State Exam 12/18/2017  Orientation to time 5  Orientation to Place 5  Registration 3  Attention/ Calculation 0  Recall 1  Recall-comments unable to recall 2 of 3 words  Language- name 2 objects 0  Language- repeat 1  Language- follow 3 step command 3  Language- read & follow direction 0  Write a sentence 0  Copy design 0  Total score 18    Patient concerns:   None  Nurse concerns:  None  Next PCP appt:   12/24/17 @ 1030  I reviewed health advisor's note, was available for consultation, and agree with documentation and plan. Loura Pardon MD

## 2017-12-18 NOTE — Progress Notes (Signed)
Subjective:   Sara [name] is a 84 y.o. female who presents for an Initial Medicare Annual Wellness Visit.  Review of Systems    N/A Cardiac Risk Factors include: advanced age (>14men, >40 women);hypertension;dyslipidemia     Objective:    Today's Vitals   12/18/17 1041 12/18/17 1042  BP: 118/84   Pulse: 75   Temp: 99 F (37.2 C)   TempSrc: Oral   SpO2: 94%   Weight: 151 lb 8 oz (68.7 kg)   Height: 5' 2.5" (1.588 m)   PainSc: 7  7   PainLoc: Generalized    Body mass index is 27.27 kg/m.  Advanced Directives 12/18/2017 11/28/2016 11/28/2016  Does Patient Have a Medical Advance Directive? No No No  Would patient like information on creating a medical advance directive? No - Patient declined No - Patient declined -    Current Medications (verified) Outpatient Encounter Medications as of 12/18/2017  Medication Sig  . aspirin EC 81 MG tablet Take 81 mg by mouth daily.    Marland Kitchen CARTIA XT 120 MG 24 hr capsule TAKE 1 CAPSULE EVERY DAY  . Cholecalciferol (VITAMIN D-3) 1000 UNITS CAPS Take 1,000 Units by mouth daily.   Mariane Baumgarten Sodium (EQ STOOL SOFTENER PO) Take 100 mg by mouth daily as needed (constipation).   Marland Kitchen doxylamine, Sleep, (EQ SLEEP AID) 25 MG tablet Take 25 mg by mouth at bedtime as needed for sleep.   . hydrochlorothiazide (HYDRODIURIL) 25 MG tablet TAKE 1 TABLET EVERY DAY  . nitroGLYCERIN (NITROSTAT) 0.4 MG SL tablet Place 1 tablet (0.4 mg total) under the tongue every 5 (five) minutes as needed for chest pain (max 3 doses in 15 minutes).  . polyethylene glycol (MIRALAX / GLYCOLAX) packet Take 17 g by mouth daily.   . potassium chloride (K-DUR,KLOR-CON) 10 MEQ tablet TAKE 1 TABLET EVERY DAY  . ranitidine (ZANTAC) 150 MG capsule Take 1 capsule (150 mg total) by mouth 2 (two) times daily.   No facility-administered encounter medications on file as of 12/18/2017.     Allergies (verified) Amoxicillin; Cefpodoxime proxetil; Clarithromycin; Fosamax [alendronate  sodium]; Furosemide; and Gabapentin   History: Past Medical History:  Diagnosis Date  . Allergy   . CAD (coronary artery disease)   . Clinical trial participant    [facility] studies in familial dementia  . Dyspnea 11/28/2016  . Frequent UTI   . GERD (gastroesophageal reflux disease)   . Hypertension   . Osteoarthritis    hips, back  . Osteopenia   . Pneumonia   . Spinal stenosis   . Stroke [facility]) 1985   Past Surgical History:  Procedure Laterality Date  . ABDOMINAL HYSTERECTOMY     partial has one ovary  . APPENDECTOMY    . CATARACT EXTRACTION Bilateral   . LEFT HEART CATH AND CORONARY ANGIOGRAPHY N/A 11/29/2016   Procedure: Left Heart Cath and Coronary Angiography;  Surgeon: Sherren Mocha, MD;  Location: [facility];  Service: Cardiovascular;  Laterality: N/A;  . LUMBAR DISC SURGERY     x 3  . rotator cuff surgery Right 2010   Family History  Problem Relation Age of Onset  . Lung cancer Brother   . Other Mother        kidney tumor  . Hypertension Mother   . Diabetes Sister   . Diabetes Brother   . Diabetes Sister        x 2  . Colon cancer Maternal Uncle   .  Other Sister        intestine burst  . Breast cancer Maternal Aunt        x 2  . Breast cancer Daughter   . Diabetes Son        x 3  . Diabetes Daughter   . Esophageal cancer Neg Hx   . Rectal cancer Neg Hx   . Stomach cancer Neg Hx    Social History   Socioeconomic History  . Marital status: Married    Spouse name: Not on file  . Number of children: 6  . Years of education: Not on file  . Highest education level: Not on file  Occupational History  . Occupation: RETIRED    Employer: RETIRED  Social Needs  . Financial resource strain: Not on file  . Food insecurity:    Worry: Not on file    Inability: Not on file  . Transportation needs:    Medical: Not on file    Non-medical: Not on file  Tobacco Use  . Smoking status: Never Smoker  . Smokeless tobacco: Never Used    Substance and Sexual Activity  . Alcohol use: No    Alcohol/week: 0.0 oz  . Drug use: No  . Sexual activity: Not on file  Lifestyle  . Physical activity:    Days per week: Not on file    Minutes per session: Not on file  . Stress: Not on file  Relationships  . Social connections:    Talks on phone: Not on file    Gets together: Not on file    Attends religious service: Not on file    Active member of club or organization: Not on file    Attends meetings of clubs or organizations: Not on file    Relationship status: Not on file  Other Topics Concern  . Not on file  Social History Narrative  . Not on file    Tobacco Counseling Counseling given: No   Clinical Intake:  Pre-visit preparation completed: Yes  Pain : 0-10 Pain Score: 7  Pain Type: Chronic pain Pain Location: Back(right hip) Pain Orientation: Lower Pain Onset: More than a month ago Pain Frequency: Constant Pain Relieving Factors: ice  Pain Relieving Factors: ice  Nutritional Status: BMI 25 -29 Overweight Nutritional Risks: None Diabetes: No  How often do you need to have someone help you when you read instructions, pamphlets, or other written materials from your doctor or pharmacy?: 1 - Never What is the last grade level you completed in school?: 7th grade  Interpreter Needed?: No  Comments: pt lives alone Information entered by :: LPinson, LPN   Activities of Daily Living In your present state of health, do you have any difficulty performing the following activities: 12/18/2017  Hearing? Y  Vision? N  Difficulty concentrating or making decisions? N  Walking or climbing stairs? N  Dressing or bathing? N  Doing errands, shopping? N  Preparing Food and eating ? N  Using the Toilet? N  In the past six months, have you accidently leaked urine? N  Do you have problems with loss of bowel control? N  Managing your Medications? N  Managing your Finances? N  Housekeeping or managing your  Housekeeping? N  Some recent data might be hidden     Immunizations and Health Maintenance Immunization History  Administered Date(s) Administered  . Influenza Split 05/20/2011  . Influenza Whole 03/25/2006, 04/07/2008, 04/11/2009, 03/08/2010, 03/04/2012  . Influenza,inj,Quad PF,6+ Mos 03/05/2013, 02/11/2014, 03/06/2015, 03/15/2016,  03/11/2017  . Pneumococcal Conjugate-13 12/16/2016  . Pneumococcal Polysaccharide-23 02/09/1996, 06/16/2013  . Td 03/23/1998   There are no preventive care reminders to display for this patient.  Patient Care Team: Tower, Wynelle Fanny, MD as PCP - General  Indicate any recent Medical Services you may have received from other than [facility] providers in the past year (date may be approximate).     Assessment:   This is a routine wellness examination for Belanna.  Hearing/Vision screen Hearing Screening Comments: Bilateral hearing aids Vision Screening Comments: Vision exam in 2019 with Dr. Einar Gip  Dietary issues and exercise activities discussed: Current Exercise Habits: Home exercise routine, Type of exercise: walking, Time (Minutes): 20, Frequency (Times/Week): 3, Weekly Exercise (Minutes/Week): 60, Intensity: Mild, Exercise limited by: None identified  Goals    . Patient Stated     Starting 12/18/2017, I will continue to take medications as prescribed.        Depression Screen PHQ 2/9 Scores 12/18/2017 12/16/2016  PHQ - 2 Score 0 0  PHQ- 9 Score 0 4    Fall Risk Fall Risk  12/18/2017 12/16/2016  Falls in the past year? No No   Cognitive Function: MMSE - Mini Mental State Exam 12/18/2017  Orientation to time 5  Orientation to Place 5  Registration 3  Attention/ Calculation 0  Recall 1  Recall-comments unable to recall 2 of 3 words  Language- name 2 objects 0  Language- repeat 1  Language- follow 3 step command 3  Language- read & follow direction 0  Write a sentence 0  Copy design 0  Total score 18     PLEASE NOTE: A Mini-Cog screen  was completed. Maximum score is 20. A value of 0 denotes this part of Folstein MMSE was not completed or the patient failed this part of the Mini-Cog screening.   Mini-Cog Screening Orientation to Time - Max 5 pts Orientation to Place - Max 5 pts Registration - Max 3 pts Recall - Max 3 pts Language Repeat - Max 1 pts Language Follow 3 Step Command - Max 3 pts     Screening Tests Health Maintenance  Topic Date Due  . TETANUS/TDAP  12/19/2018 (Originally 03/23/2008)  . INFLUENZA VACCINE  01/08/2018  . MAMMOGRAM  10/02/2018  . DEXA SCAN  Completed  . PNA vac Low Risk Adult  Completed     Plan:   I have personally reviewed, addressed, and noted the following in the patient's chart:  A. Medical and social history B. Use of alcohol, tobacco or illicit drugs  C. Current medications and supplements D. Functional ability and status E.  Nutritional status F.  Physical activity G. Advance directives H. List of other physicians I.  Hospitalizations, surgeries, and ER visits in previous 12 months J.  [place] to include hearing, vision, cognitive, depression L. Referrals and appointments - none  In addition, I have reviewed and discussed with patient certain preventive protocols, quality metrics, and best practice recommendations. A written personalized care plan for preventive services as well as general preventive health recommendations were provided to patient.  See attached scanned questionnaire for additional information.   Signed,   Lindell Noe, MHA, BS, LPN Health Coach

## 2017-12-18 NOTE — Patient Instructions (Signed)
Sara [name] , Thank you for taking time to come for your Medicare Wellness Visit. I appreciate your ongoing commitment to your health goals. Please review the following plan we discussed and let me know if I can assist you in the future.   These are the goals we discussed: Goals    . Patient Stated     Starting 12/18/2017, I will continue to take medications as prescribed.         This is a list of the screening recommended for you and due dates:  Health Maintenance  Topic Date Due  . Tetanus Vaccine  12/19/2018*  . Flu Shot  01/08/2018  . Mammogram  10/02/2018  . DEXA scan (bone density measurement)  Completed  . Pneumonia vaccines  Completed  *Topic was postponed. The date shown is not the original due date.   Preventive Care for Adults  A healthy lifestyle and preventive care can promote health and wellness. Preventive health guidelines for adults include the following key practices.  . A routine yearly physical is a good way to check with your health care provider about your health and preventive screening. It is a chance to share any concerns and updates on your health and to receive a thorough exam.  . Visit your dentist for a routine exam and preventive care every 6 months. Brush your teeth twice a day and floss once a day. Good oral hygiene prevents tooth decay and gum disease.  . The frequency of eye exams is based on your age, health, family medical history, use  of contact lenses, and other factors. Follow your health care provider's recommendations for frequency of eye exams.  . Eat a healthy diet. Foods like vegetables, fruits, whole grains, low-fat dairy products, and lean protein foods contain the nutrients you need without too many calories. Decrease your intake of foods high in solid fats, added sugars, and salt. Eat the right amount of calories for you. Get information about a proper diet from your health care provider, if necessary.  . Regular physical  exercise is one of the most important things you can do for your health. Most adults should get at least 150 minutes of moderate-intensity exercise (any activity that increases your heart rate and causes you to sweat) each week. In addition, most adults need muscle-strengthening exercises on 2 or more days a week.  Silver Sneakers may be a benefit available to you. To determine eligibility, you may visit the website: www.silversneakers.com or contact program at 951-057-0133 Mon-Fri between 8AM-8PM.   . Maintain a healthy weight. The body mass index (BMI) is a screening tool to identify possible weight problems. It provides an estimate of body fat based on height and weight. Your health care provider can find your BMI and can help you achieve or maintain a healthy weight.   For adults 20 years and older: ? A BMI below 18.5 is considered underweight. ? A BMI of 18.5 to 24.9 is normal. ? A BMI of 25 to 29.9 is considered overweight. ? A BMI of 30 and above is considered obese.   . Maintain normal blood lipids and cholesterol levels by exercising and minimizing your intake of saturated fat. Eat a balanced diet with plenty of fruit and vegetables. Blood tests for lipids and cholesterol should begin at age 10 and be repeated every 5 years. If your lipid or cholesterol levels are high, you are over 50, or you are at high risk for heart disease, you may need your cholesterol  levels checked more frequently. Ongoing high lipid and cholesterol levels should be treated with medicines if diet and exercise are not working.  . If you smoke, find out from your health care provider how to quit. If you do not use tobacco, please do not start.  . If you choose to drink alcohol, please do not consume more than 2 drinks per day. One drink is considered to be 12 ounces (355 mL) of beer, 5 ounces (148 mL) of wine, or 1.5 ounces (44 mL) of liquor.  . If you are 14-61 years old, ask your health care provider if you  should take aspirin to prevent strokes.  . Use sunscreen. Apply sunscreen liberally and repeatedly throughout the day. You should seek shade when your shadow is shorter than you. Protect yourself by wearing long sleeves, pants, a wide-brimmed hat, and sunglasses year round, whenever you are outdoors.  . Once a month, do a whole body skin exam, using a mirror to look at the skin on your back. Tell your health care provider of new moles, moles that have irregular borders, moles that are larger than a pencil eraser, or moles that have changed in shape or color.

## 2017-12-24 ENCOUNTER — Encounter: Payer: Self-pay | Admitting: Family Medicine

## 2017-12-24 ENCOUNTER — Ambulatory Visit (INDEPENDENT_AMBULATORY_CARE_PROVIDER_SITE_OTHER): Payer: Medicare HMO | Admitting: Family Medicine

## 2017-12-24 VITALS — BP 130/82 | HR 55 | Temp 98.6°F | Ht 62.5 in | Wt 151.5 lb

## 2017-12-24 DIAGNOSIS — M810 Age-related osteoporosis without current pathological fracture: Secondary | ICD-10-CM | POA: Diagnosis not present

## 2017-12-24 DIAGNOSIS — E559 Vitamin D deficiency, unspecified: Secondary | ICD-10-CM

## 2017-12-24 DIAGNOSIS — Z0000 Encounter for general adult medical examination without abnormal findings: Secondary | ICD-10-CM | POA: Diagnosis not present

## 2017-12-24 DIAGNOSIS — R739 Hyperglycemia, unspecified: Secondary | ICD-10-CM

## 2017-12-24 DIAGNOSIS — E7800 Pure hypercholesterolemia, unspecified: Secondary | ICD-10-CM

## 2017-12-24 DIAGNOSIS — I5032 Chronic diastolic (congestive) heart failure: Secondary | ICD-10-CM | POA: Diagnosis not present

## 2017-12-24 DIAGNOSIS — I10 Essential (primary) hypertension: Secondary | ICD-10-CM | POA: Diagnosis not present

## 2017-12-24 MED ORDER — DILTIAZEM HCL ER COATED BEADS 120 MG PO CP24: 120.0000 mg | ORAL_CAPSULE | Freq: Every day | ORAL | 3 refills | Status: DC

## 2017-12-24 MED ORDER — HYDROCHLOROTHIAZIDE 25 MG PO TABS: 25.0000 mg | ORAL_TABLET | Freq: Every day | ORAL | 3 refills | Status: DC

## 2017-12-24 MED ORDER — NITROGLYCERIN 0.4 MG SL SUBL: 0.4000 mg | SUBLINGUAL_TABLET | SUBLINGUAL | 3 refills | Status: DC | PRN

## 2017-12-24 MED ORDER — RANITIDINE HCL 150 MG PO CAPS: 150.0000 mg | ORAL_CAPSULE | Freq: Two times a day (BID) | ORAL | 3 refills | Status: DC

## 2017-12-24 MED ORDER — POTASSIUM CHLORIDE CRYS ER 10 MEQ PO TBCR: 10.0000 meq | EXTENDED_RELEASE_TABLET | Freq: Every day | ORAL | 3 refills | Status: DC

## 2017-12-24 NOTE — Progress Notes (Signed)
Subjective:    Patient ID: Sara [name], female    DOB: [birth year], 84 y.o.   MRN: [id number]  HPI Here for health maintenance exam and to review chronic medical problems   Has been feeling ok   Wt Readings from Last 3 Encounters:  12/24/17 151 lb 8 oz (68.7 kg)  12/18/17 151 lb 8 oz (68.7 kg)  06/17/17 153 lb 1.9 oz (69.5 kg)  stable  27.27 kg/m   Had amw 7/11 Mini cog 18/20   Unable to recall 2 of 3 words Watching this  She has not noticed problems at home  No confusion  Not getting lost Still does her finances/etc   Mammogram 4/19 normal  Self breast exam --no lumps or changes   dexa 7/18 - OP of forearm  openia of the hip  Tried fosamax- stopped due to side eff of body pain  D level is 46  Zoster status -had shingles/ interested in shingrix   Colonoscopy 3/15- could not complete due to tortuous colon  Did a cologuard test with GI -negative  bp is stable today  No cp or palpitations or headaches or edema  No side effects to medicines  BP Readings from Last 3 Encounters:  12/24/17 130/82  12/18/17 118/84  06/17/17 140/68     Hyperglycemia Lab Results  Component Value Date   HGBA1C 6.2 12/18/2017  stable Prediabetic  She wants to cut back on sugar/ she loves sweets and makes cakes   Hyperlipidemia in setting of CAD req stent Lab Results  Component Value Date   CHOL 186 12/18/2017   CHOL 187 11/29/2016   CHOL 194 07/24/2015   Lab Results  Component Value Date   HDL 53.70 12/18/2017   HDL 53 11/29/2016   HDL 53.70 07/24/2015   Lab Results  Component Value Date   LDLCALC 100 (H) 12/18/2017   LDLCALC 122 (H) 11/29/2016   LDLCALC 110 (H) 07/24/2015   Lab Results  Component Value Date   TRIG 160.0 (H) 12/18/2017   TRIG 60 11/29/2016   TRIG 153.0 (H) 07/24/2015   Lab Results  Component Value Date   CHOLHDL 3 12/18/2017   CHOLHDL 3.5 11/29/2016   CHOLHDL 4 07/24/2015   Lab Results  Component Value Date   LDLDIRECT 143.1  06/16/2013   LDLDIRECT 143.4 06/30/2008   Atorvastatin -stopped due to body pain  Watching her diet  Saw cardiology  No chest pain  Still gets short of breath   She could not tolerate PFTs -could not finish them   Lab Results  Component Value Date   WBC 5.8 12/18/2017   HGB 13.4 12/18/2017   HCT 41.0 12/18/2017   MCV 86.9 12/18/2017   PLT 206.0 12/18/2017   Lab Results  Component Value Date   CREATININE 0.90 12/18/2017   BUN 15 12/18/2017   NA 140 12/18/2017   K 3.9 12/18/2017   CL 103 12/18/2017   CO2 30 12/18/2017   Lab Results  Component Value Date   ALT 13 12/18/2017   AST 18 12/18/2017   ALKPHOS 69 12/18/2017   BILITOT 0.7 12/18/2017    Lab Results  Component Value Date   TSH 1.45 12/18/2017     Patient Active Problem List   Diagnosis Date Noted  . Chronic diastolic heart failure ([place]) 12/27/2017  . Left low back pain 03/11/2017  . Diastolic dysfunction 38/25/0539  . Estrogen deficiency 12/16/2016  . Routine general medical examination at a health care facility 12/16/2016  .  CAD (coronary artery disease) 12/04/2016  . Dyspepsia 03/15/2016  . 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 Eczema 03/06/2015  . Hyperglycemia 02/11/2014  . Intertrigo labialis 12/07/2012  . CONSTIPATION, CHRONIC 07/03/2010  . Osteoporosis 06/24/2008  . HYPERCHOLESTEROLEMIA 12/08/2006  . Essential hypertension 12/08/2006  . GERD 12/08/2006  . H/O herpes zoster 12/08/2006   Past Medical History:  Diagnosis Date  . Allergy   . CAD (coronary artery disease)   . Clinical trial participant    [facility] studies in familial dementia  . Dyspnea 11/28/2016  . Frequent UTI   . GERD (gastroesophageal reflux disease)   . Hypertension   . Osteoarthritis    hips, back  . Osteopenia   . Pneumonia   . Spinal stenosis   . Stroke [facility]) 1985   Past Surgical History:  Procedure Laterality Date  . ABDOMINAL HYSTERECTOMY     partial has one ovary  . APPENDECTOMY    . CATARACT  EXTRACTION Bilateral   . LEFT HEART CATH AND CORONARY ANGIOGRAPHY N/A 11/29/2016   Procedure: Left Heart Cath and Coronary Angiography;  Surgeon: Sherren Mocha, MD;  Location: [facility];  Service: Cardiovascular;  Laterality: N/A;  . LUMBAR DISC SURGERY     x 3  . rotator cuff surgery Right 2010   Social History   Tobacco Use  . Smoking status: Never Smoker  . Smokeless tobacco: Never Used  Substance Use Topics  . Alcohol use: No    Alcohol/week: 0.0 oz  . Drug use: No   Family History  Problem Relation Age of Onset  . Lung cancer Brother   . Other Mother        kidney tumor  . Hypertension Mother   . Diabetes Sister   . Diabetes Brother   . Diabetes Sister        x 2  . Colon cancer Maternal Uncle   . Other Sister        intestine burst  . Breast cancer Maternal Aunt        x 2  . Breast cancer Daughter   . Diabetes Son        x 3  . Diabetes Daughter   . Esophageal cancer Neg Hx   . Rectal cancer Neg Hx   . Stomach cancer Neg Hx    Allergies  Allergen Reactions  . Amoxicillin     Reaction not known  . Atorvastatin     aching  . Cefpodoxime Proxetil     Reaction not known  . Clarithromycin     Reaction not known  . Fosamax [Alendronate Sodium]     Body pain   . Furosemide     REACTION: doesn't help  . Gabapentin Swelling    Caused severe swellling   Current Outpatient Medications on File Prior to Visit  Medication Sig Dispense Refill  . aspirin EC 81 MG tablet Take 81 mg by mouth daily.      . Cholecalciferol (VITAMIN D-3) 1000 UNITS CAPS Take 1,000 Units by mouth daily.     Mariane Baumgarten Sodium (EQ STOOL SOFTENER PO) Take 100 mg by mouth daily as needed (constipation).     Marland Kitchen doxylamine, Sleep, (EQ SLEEP AID) 25 MG tablet Take 25 mg by mouth at bedtime as needed for sleep.     . polyethylene glycol (MIRALAX / GLYCOLAX) packet Take 17 g by mouth daily.      No current facility-administered medications on file prior to visit.  Review of  Systems  Constitutional: Negative for activity change, appetite change, fatigue, fever and unexpected weight change.  HENT: Negative for congestion, ear pain, rhinorrhea, sinus pressure and sore throat.   Eyes: Negative for pain, redness and visual disturbance.  Respiratory: Negative for cough, shortness of breath and wheezing.        Occ sob on exertion   Cardiovascular: Negative for chest pain and palpitations.  Gastrointestinal: Negative for abdominal pain, blood in stool, constipation and diarrhea.  Endocrine: Negative for polydipsia and polyuria.  Genitourinary: Negative for dysuria, frequency and urgency.  Musculoskeletal: Positive for arthralgias, back pain and myalgias.  Skin: Negative for pallor and rash.  Allergic/Immunologic: Negative for environmental allergies.  Neurological: Negative for dizziness, syncope and headaches.  Hematological: Negative for adenopathy. Does not bruise/bleed easily.  Psychiatric/Behavioral: Negative for decreased concentration and dysphoric mood. The patient is not nervous/anxious.        Objective:   Physical Exam  Constitutional: She appears well-developed and well-nourished. No distress.  overwt and well app  HENT:  Head: Normocephalic and atraumatic.  Right Ear: External ear normal.  Left Ear: External ear normal.  Mouth/Throat: Oropharynx is clear and moist.  Eyes: Pupils are equal, round, and reactive to light. Conjunctivae and EOM are normal. No scleral icterus.  Neck: Normal range of motion. Neck supple. No JVD present. Carotid bruit is not present. No thyromegaly present.  Cardiovascular: Normal rate, regular rhythm, normal heart sounds and intact distal pulses. Exam reveals no gallop.  Pulmonary/Chest: Effort normal and breath sounds normal. No respiratory distress. She has no wheezes. She exhibits no tenderness. No breast tenderness, discharge or bleeding.  Abdominal: Soft. Bowel sounds are normal. She exhibits no distension, no  abdominal bruit and no mass. There is no tenderness.  Genitourinary: No breast tenderness, discharge or bleeding.  Genitourinary Comments: Breast exam: No mass, nodules, thickening, tenderness, bulging, retraction, inflamation, nipple discharge or skin changes noted.  No axillary or clavicular LA.      Musculoskeletal: Normal range of motion. She exhibits no edema or tenderness.  Lymphadenopathy:    She has no cervical adenopathy.  Neurological: She is alert. She has normal reflexes. She displays normal reflexes. No cranial nerve deficit. She exhibits normal muscle tone. Coordination normal.  Skin: Skin is warm and dry. No rash noted. No erythema. No pallor.  Psychiatric: She has a normal mood and affect.  Pleasant  Mentally sharp today          Assessment & Plan:   Problem List Items Addressed This Visit      Cardiovascular and Mediastinum   Chronic diastolic heart failure (HCC)    No symptoms or c/o      Relevant Medications   diltiazem (CARTIA XT) 120 MG 24 hr capsule   hydrochlorothiazide (HYDRODIURIL) 25 MG tablet   Essential hypertension   Relevant Medications   diltiazem (CARTIA XT) 120 MG 24 hr capsule   hydrochlorothiazide (HYDRODIURIL) 25 MG tablet     Musculoskeletal and Integument   Osteoporosis    dexa 7/18 OP of forearm  Stopped fosamax due to side eff of body pain  Does not desire other tx right now  tx vit D level  Disc imp of exercise No falls or fx         Other   HYPERCHOLESTEROLEMIA    Stopped atorvastatin due to body pain  Controlling with diet  Disc goals for lipids and reasons to control them Rev last labs with pt Rev low sat  fat diet in detail       Relevant Medications   diltiazem (CARTIA XT) 120 MG 24 hr capsule   hydrochlorothiazide (HYDRODIURIL) 25 MG tablet   Hyperglycemia    Lab Results  Component Value Date   HGBA1C 6.2 12/18/2017   Stable disc imp of low glycemic diet and wt loss to prevent DM2       Routine general  medical examination at a health care facility - Primary    Reviewed health habits including diet and exercise and skin cancer prevention Reviewed appropriate screening tests for age  Also reviewed health mt list, fam hx and immunization status , as well as social and family history   See HPI Labs reviewed  amw rev-disc ways to slow down memory loss of aging Disc shingrix vaccine  Neg cologuard test        RESOLVED: Vitamin D deficiency

## 2017-12-24 NOTE — Patient Instructions (Addendum)
If you are interested in the new shingles vaccine (Shingrix) - call your local pharmacy to check on coverage and availability  If interested and it affordable - then get on a waiting list at your pharmacy   Try to get most of your carbohydrates from produce (with the exception of white potatoes)  Eat less bread/pasta/rice/snack foods/cereals/sweets and other items from the middle of the grocery store (processed carbs)   For cholesterol  Avoid red meat/ fried foods/ egg yolks/ fatty breakfast meats/ butter, cheese and high fat dairy/ and shellfish   Especially since you cannot take the cholesterol medicine    have a good safe trip to [place]

## 2017-12-26 ENCOUNTER — Other Ambulatory Visit: Payer: Self-pay | Admitting: *Deleted

## 2017-12-26 MED ORDER — NITROGLYCERIN 0.4 MG SL SUBL: 0.4000 mg | SUBLINGUAL_TABLET | SUBLINGUAL | 3 refills | Status: DC | PRN

## 2017-12-26 NOTE — Telephone Encounter (Signed)
Fax received stating that original Rx to [facility] indicated a quantity of 15 tablets.  This medication is packaged as #25 tabs and cannot be broken.  New Rx indicating #25 is ready to be sent, pending.

## 2017-12-27 DIAGNOSIS — I5032 Chronic diastolic (congestive) heart failure: Secondary | ICD-10-CM | POA: Insufficient documentation

## 2017-12-27 NOTE — Assessment & Plan Note (Signed)
Lab Results  Component Value Date   HGBA1C 6.2 12/18/2017   Stable disc imp of low glycemic diet and wt loss to prevent DM2

## 2017-12-27 NOTE — Assessment & Plan Note (Signed)
Reviewed health habits including diet and exercise and skin cancer prevention Reviewed appropriate screening tests for age  Also reviewed health mt list, fam hx and immunization status , as well as social and family history   See HPI Labs reviewed  amw rev-disc ways to slow down memory loss of aging Disc shingrix vaccine  Neg cologuard test

## 2017-12-27 NOTE — Assessment & Plan Note (Signed)
No symptoms or c/o

## 2017-12-27 NOTE — Assessment & Plan Note (Signed)
Stopped atorvastatin due to body pain  Controlling with diet  Disc goals for lipids and reasons to control them Rev last labs with pt Rev low sat fat diet in detail

## 2017-12-27 NOTE — Assessment & Plan Note (Signed)
dexa 7/18 OP of forearm  Stopped fosamax due to side eff of body pain  Does not desire other tx right now  tx vit D level  Disc imp of exercise No falls or fx

## 2018-01-01 ENCOUNTER — Other Ambulatory Visit: Payer: Self-pay | Admitting: Family Medicine

## 2018-01-01 NOTE — Telephone Encounter (Signed)
Copied from [place] [phone]. Topic: Quick Communication - Rx Refill/Question >> Jan 01, 2018  2:25 PM Synthia Innocent wrote: Medication: nitroGLYCERIN (NITROSTAT) 0.4 MG SL tablet   Has the patient contacted their pharmacy? Yes.   (Agent: If no, request that the patient contact the pharmacy for the refill.) (Agent: If yes, when and what did the pharmacy advise?)  Preferred Pharmacy (with phone number or street name): [facility] [address], does not want order from [place]: Please be advised that RX refills may take up to 3 business days. We ask that you follow-up with your pharmacy.

## 2018-01-02 MED ORDER — NITROGLYCERIN 0.4 MG SL SUBL: 0.4000 mg | SUBLINGUAL_TABLET | SUBLINGUAL | 3 refills | Status: AC | PRN

## 2018-01-02 NOTE — Telephone Encounter (Signed)
LOV  12/24/17 Dr. Glori Bickers Pt. [facility]

## 2018-01-05 ENCOUNTER — Ambulatory Visit (INDEPENDENT_AMBULATORY_CARE_PROVIDER_SITE_OTHER): Payer: Medicare HMO | Admitting: Family Medicine

## 2018-01-05 ENCOUNTER — Encounter: Payer: Self-pay | Admitting: Family Medicine

## 2018-01-05 ENCOUNTER — Ambulatory Visit: Payer: Self-pay | Admitting: *Deleted

## 2018-01-05 ENCOUNTER — Telehealth: Payer: Self-pay | Admitting: Family Medicine

## 2018-01-05 VITALS — BP 124/76 | HR 54 | Temp 98.2°F | Ht 62.5 in | Wt 149.0 lb

## 2018-01-05 DIAGNOSIS — R51 Headache: Secondary | ICD-10-CM

## 2018-01-05 DIAGNOSIS — L308 Other specified dermatitis: Secondary | ICD-10-CM

## 2018-01-05 DIAGNOSIS — J3489 Other specified disorders of nose and nasal sinuses: Secondary | ICD-10-CM | POA: Diagnosis not present

## 2018-01-05 DIAGNOSIS — R519 Headache, unspecified: Secondary | ICD-10-CM

## 2018-01-05 NOTE — Patient Instructions (Signed)
Your skin condition does not look like shingles- however if tiny blisters break out please let me know  Also if increased pain or burning- also call and let me know Keep areas clean  Moisturize with a non scented body lotion Try cort aid or cortisone 10 cream over the counter for itch   Also claritin 10 mg over the counter (1 pill daily)- may help runny nose/ itching and headache  Your headache may be related to allergies   If headaches persist or worsen please let me know   Avoid caffeine  Drink lots of water/stay hydrated

## 2018-01-05 NOTE — Assessment & Plan Note (Signed)
Suspect rash today on thigh/abd /back is eczema or atopic rxn Adv moisturizer  otc cortisone  Gentle cleansing / avoid hot water or harsh detergent  claritin daily for itch prn Update if not starting to improve in a week or if worsening

## 2018-01-05 NOTE — Progress Notes (Signed)
Subjective:    Patient ID: Sara [name], female    DOB: 08/05/1931, 84 y.o.   MRN: [id number]  HPI Here for ? Shingles Also headache   Wt Readings from Last 3 Encounters:  01/05/18 149 lb (67.6 kg)  12/24/17 151 lb 8 oz (68.7 kg)  12/18/17 151 lb 8 oz (68.7 kg)   26.82 kg/m   Having itching and pain  Outside of R thigh and then in navel and also L back  Not a lot of pain- burns a little  No blisters  Putting blue star ointment- helps for a while   She has had a headache lately (on and off )  Top of head - dull pain/ mild  She gets tired easily with it  No numbness/weakness  No facial droop/confusion or speech difficulty  Head does not hurt today  May be allergy/sinus related- some congestion  BP Readings from Last 3 Encounters:  01/05/18 124/76  12/24/17 130/82  12/18/17 118/84   Pulse Readings from Last 3 Encounters:  01/05/18 (!) 54  12/24/17 (!) 55  12/18/17 75     Going on a cruise in august and worried about it   Patient Active Problem List   Diagnosis Date Noted  . Mild headache 01/05/2018  . Rhinorrhea 01/05/2018  . Chronic diastolic heart failure ([facility]) 12/27/2017  . Left low back pain 03/11/2017  . Diastolic dysfunction 44/08/4740  . Estrogen deficiency 12/16/2016  . Routine general medical examination at a health care facility 12/16/2016  . CAD (coronary artery disease) 12/04/2016  . Dyspepsia 03/15/2016  . 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 Eczema 03/06/2015  . Hyperglycemia 02/11/2014  . Intertrigo labialis 12/07/2012  . CONSTIPATION, CHRONIC 07/03/2010  . Osteoporosis 06/24/2008  . HYPERCHOLESTEROLEMIA 12/08/2006  . Essential hypertension 12/08/2006  . GERD 12/08/2006  . H/O herpes zoster 12/08/2006   Past Medical History:  Diagnosis Date  . Allergy   . CAD (coronary artery disease)   . Clinical trial participant    [facility] studies in familial dementia  . Dyspnea 11/28/2016  . Frequent UTI   . GERD  (gastroesophageal reflux disease)   . Hypertension   . Osteoarthritis    hips, back  . Osteopenia   . Pneumonia   . Spinal stenosis   . Stroke [facility]) 1985   Past Surgical History:  Procedure Laterality Date  . ABDOMINAL HYSTERECTOMY     partial has one ovary  . APPENDECTOMY    . CATARACT EXTRACTION Bilateral   . LEFT HEART CATH AND CORONARY ANGIOGRAPHY N/A 11/29/2016   Procedure: Left Heart Cath and Coronary Angiography;  Surgeon: Sherren Mocha, MD;  Location: [place] [facility];  Service: Cardiovascular;  Laterality: N/A;  . LUMBAR DISC SURGERY     x 3  . rotator cuff surgery Right 2010   Social History   Tobacco Use  . Smoking status: Never Smoker  . Smokeless tobacco: Never Used  Substance Use Topics  . Alcohol use: No    Alcohol/week: 0.0 oz  . Drug use: No   Family History  Problem Relation Age of Onset  . Lung cancer Brother   . Other Mother        kidney tumor  . Hypertension Mother   . Diabetes Sister   . Diabetes Brother   . Diabetes Sister        x 2  . Colon cancer Maternal Uncle   . Other Sister  intestine burst  . Breast cancer Maternal Aunt        x 2  . Breast cancer Daughter   . Diabetes Son        x 3  . Diabetes Daughter   . Esophageal cancer Neg Hx   . Rectal cancer Neg Hx   . Stomach cancer Neg Hx    Allergies  Allergen Reactions  . Amoxicillin     Reaction not known  . Atorvastatin     aching  . Cefpodoxime Proxetil     Reaction not known  . Clarithromycin     Reaction not known  . Fosamax [Alendronate Sodium]     Body pain   . Furosemide     REACTION: doesn't help  . Gabapentin Swelling    Caused severe swellling   Current Outpatient Medications on File Prior to Visit  Medication Sig Dispense Refill  . aspirin EC 81 MG tablet Take 81 mg by mouth daily.      . Cholecalciferol (VITAMIN D-3) 1000 UNITS CAPS Take 1,000 Units by mouth daily.     Marland Kitchen diltiazem (CARTIA XT) 120 MG 24 hr capsule Take 1 capsule (120 mg total)  by mouth daily. 90 capsule 3  . Docusate Sodium (EQ STOOL SOFTENER PO) Take 100 mg by mouth daily as needed (constipation).     Marland Kitchen doxylamine, Sleep, (EQ SLEEP AID) 25 MG tablet Take 25 mg by mouth at bedtime as needed for sleep.     . hydrochlorothiazide (HYDRODIURIL) 25 MG tablet Take 1 tablet (25 mg total) by mouth daily. 90 tablet 3  . nitroGLYCERIN (NITROSTAT) 0.4 MG SL tablet Place 1 tablet (0.4 mg total) under the tongue every 5 (five) minutes as needed for chest pain (max 3 doses in 15 minutes). 25 tablet 3  . polyethylene glycol (MIRALAX / GLYCOLAX) packet Take 17 g by mouth daily.     . potassium chloride (K-DUR,KLOR-CON) 10 MEQ tablet Take 1 tablet (10 mEq total) by mouth daily. 90 tablet 3  . ranitidine (ZANTAC) 150 MG capsule Take 1 capsule (150 mg total) by mouth 2 (two) times daily. 180 capsule 3   No current facility-administered medications on file prior to visit.     Review of Systems  Constitutional: Negative for activity change, appetite change, fatigue, fever and unexpected weight change.  HENT: Positive for congestion, postnasal drip and rhinorrhea. Negative for ear pain, sinus pressure and sore throat.   Eyes: Negative for pain, redness and visual disturbance.  Respiratory: Negative for cough, shortness of breath and wheezing.   Cardiovascular: Negative for chest pain and palpitations.  Gastrointestinal: Negative for abdominal pain, blood in stool, constipation and diarrhea.  Endocrine: Negative for polydipsia and polyuria.  Genitourinary: Negative for dysuria, frequency and urgency.  Musculoskeletal: Negative for arthralgias, back pain and myalgias.  Skin: Positive for rash. Negative for pallor.  Allergic/Immunologic: Negative for environmental allergies.  Neurological: Positive for headaches. Negative for dizziness, tremors, seizures, syncope, facial asymmetry, speech difficulty, weakness, light-headedness and numbness.  Hematological: Negative for adenopathy. Does not  bruise/bleed easily.  Psychiatric/Behavioral: Negative for decreased concentration and dysphoric mood. The patient is not nervous/anxious.        Objective:   Physical Exam  Constitutional: She is oriented to person, place, and time. She appears well-developed and well-nourished. No distress.  Well appearing No headache today  HENT:  Head: Normocephalic and atraumatic.  Right Ear: External ear normal.  Left Ear: External ear normal.  Nose: Nose normal.  Mouth/Throat:  Oropharynx is clear and moist. No oropharyngeal exudate.  No sinus tenderness No temporal tenderness  No TMJ tenderness  Boggy nares Scant pnd  Eyes: Pupils are equal, round, and reactive to light. Conjunctivae and EOM are normal. Right eye exhibits no discharge. Left eye exhibits no discharge. No scleral icterus.  No nystagmus  Neck: Normal range of motion and full passive range of motion without pain. Neck supple. No JVD present. Carotid bruit is not present. No tracheal deviation present. No thyromegaly present.  Cardiovascular: Normal rate, regular rhythm and normal heart sounds.  No murmur heard. Pulmonary/Chest: Effort normal and breath sounds normal. No respiratory distress. She has no wheezes. She has no rales.  Abdominal: Soft. Bowel sounds are normal. She exhibits no distension and no mass. There is no tenderness.  Musculoskeletal: She exhibits no edema or tenderness.  Lymphadenopathy:    She has no cervical adenopathy.  Neurological: She is alert and oriented to person, place, and time. She has normal strength and normal reflexes. She displays no atrophy and no tremor. No cranial nerve deficit or sensory deficit. She exhibits normal muscle tone. She displays a negative Romberg sign. Coordination and gait normal.  No focal cerebellar signs   Skin: Skin is warm and dry. Capillary refill takes less than 2 seconds. No rash noted. No pallor.  Rash with some fine papules and dry skin on R thigh/mid abdomen and L  back Few excoriations No vesicles No tenderness  Psychiatric: She has a normal mood and affect. Her behavior is normal. Thought content normal.          Assessment & Plan:   Problem List Items Addressed This Visit      Musculoskeletal and Integument   Eczema    Suspect rash today on thigh/abd /back is eczema or atopic rxn Adv moisturizer  otc cortisone  Gentle cleansing / avoid hot water or harsh detergent  claritin daily for itch prn Update if not starting to improve in a week or if worsening          Other   Mild headache - Primary    Now resolved  No worrisome features/neurologically Reassuring exam  May be sinus/allergy related Will try claritin otc to see if this helps allergies also Adv to stay well hydrated  Avoid caffeine Will alert if this re occurs or persists Given age-would consider imaging       Rhinorrhea    Trial of claritin

## 2018-01-05 NOTE — Telephone Encounter (Signed)
Pt called and states that she has shingles; she said there are no blisters but itches; she also states that she has a headache; she is concerned because she is leaving on 01/23/18 for [place]; pt last saw Dr Glori Bickers on 12/24/17; she states that she also got 1 dose of the shingles vaccine on 12/24/17; the pt wonders if she is contagious; she can be contacted at [phone]; will route to office for further direction.  Reason for Disposition . [1] Shingles rash AND [2] onset > 72 hours ago  Answer Assessment - Initial Assessment Questions 1. APPEARANCE of RASH: "Describe the rash."      Red; no blister 2. LOCATION: "Where is the rash located?"      Between legs/thighs, back, below navel 3. ONSET: "When did the rash start?"      Prior to visit on 12/24/17 4. ITCHING: "Does the rash itch?" If so, ask: "How bad is the itch?"  (Scale 1-10; or mild, moderate, severe)     Severe at time; has tried blue star ointment which has helped 5. PAIN: "Does the rash hurt?" If so, ask: "How bad is the pain?"  (Scale 1-10; or mild, moderate, severe)   3-4 out of 10 6. OTHER SYMPTOMS: "Do you have any other symptoms?" (e.g., fever)    Intermittent Headache over forehead rated 3-4 out of 10 7. PREGNANCY: "Is there any chance you are pregnant?" "When was your last menstrual period?"     no  Protocols used: [facility]

## 2018-01-05 NOTE — Telephone Encounter (Signed)
I spoke with pt and she scheduled appt to see Dr Glori Bickers 01/05/18 at 10 :00 AM.

## 2018-01-05 NOTE — Telephone Encounter (Signed)
I will see her then  

## 2018-01-05 NOTE — Assessment & Plan Note (Signed)
Trial of claritin

## 2018-01-05 NOTE — Assessment & Plan Note (Signed)
Now resolved  No worrisome features/neurologically Reassuring exam  May be sinus/allergy related Will try claritin otc to see if this helps allergies also Adv to stay well hydrated  Avoid caffeine Will alert if this re occurs or persists Given age-would consider imaging

## 2018-01-05 NOTE — Telephone Encounter (Signed)
Pt called and states that she has shingles; she said there are no blisters but itches; she also states that she has a headache; she is concerned because she is leaving on 01/23/18 for [place]; pt last saw Dr Glori Bickers on 12/24/17; see triage note.

## 2018-01-17 ENCOUNTER — Ambulatory Visit (HOSPITAL_COMMUNITY)
Admission: EM | Admit: 2018-01-17 | Discharge: 2018-01-17 | Disposition: A | Discharge status: Home / Self Care | Payer: Medicare HMO | Attending: Emergency Medicine | Admitting: Emergency Medicine

## 2018-01-17 ENCOUNTER — Encounter (HOSPITAL_COMMUNITY): Payer: Self-pay

## 2018-01-17 ENCOUNTER — Other Ambulatory Visit: Payer: Self-pay

## 2018-01-17 DIAGNOSIS — I110 Hypertensive heart disease with heart failure: Secondary | ICD-10-CM | POA: Insufficient documentation

## 2018-01-17 DIAGNOSIS — R110 Nausea: Secondary | ICD-10-CM | POA: Insufficient documentation

## 2018-01-17 DIAGNOSIS — Z803 Family history of malignant neoplasm of breast: Secondary | ICD-10-CM | POA: Diagnosis not present

## 2018-01-17 DIAGNOSIS — R1084 Generalized abdominal pain: Secondary | ICD-10-CM | POA: Insufficient documentation

## 2018-01-17 DIAGNOSIS — Z880 Allergy status to penicillin: Secondary | ICD-10-CM | POA: Insufficient documentation

## 2018-01-17 DIAGNOSIS — Z90710 Acquired absence of both cervix and uterus: Secondary | ICD-10-CM | POA: Insufficient documentation

## 2018-01-17 DIAGNOSIS — Z7982 Long term (current) use of aspirin: Secondary | ICD-10-CM | POA: Diagnosis not present

## 2018-01-17 DIAGNOSIS — Z833 Family history of diabetes mellitus: Secondary | ICD-10-CM | POA: Insufficient documentation

## 2018-01-17 DIAGNOSIS — Z8673 Personal history of transient ischemic attack (TIA), and cerebral infarction without residual deficits: Secondary | ICD-10-CM | POA: Diagnosis not present

## 2018-01-17 DIAGNOSIS — Z9889 Other specified postprocedural states: Secondary | ICD-10-CM | POA: Insufficient documentation

## 2018-01-17 DIAGNOSIS — I2510 Atherosclerotic heart disease of native coronary artery without angina pectoris: Secondary | ICD-10-CM | POA: Diagnosis not present

## 2018-01-17 DIAGNOSIS — Z8249 Family history of ischemic heart disease and other diseases of the circulatory system: Secondary | ICD-10-CM | POA: Insufficient documentation

## 2018-01-17 DIAGNOSIS — Z888 Allergy status to other drugs, medicaments and biological substances status: Secondary | ICD-10-CM | POA: Insufficient documentation

## 2018-01-17 DIAGNOSIS — I5032 Chronic diastolic (congestive) heart failure: Secondary | ICD-10-CM | POA: Diagnosis not present

## 2018-01-17 DIAGNOSIS — E7800 Pure hypercholesterolemia, unspecified: Secondary | ICD-10-CM | POA: Insufficient documentation

## 2018-01-17 DIAGNOSIS — M479 Spondylosis, unspecified: Secondary | ICD-10-CM | POA: Diagnosis not present

## 2018-01-17 DIAGNOSIS — M160 Bilateral primary osteoarthritis of hip: Secondary | ICD-10-CM | POA: Diagnosis not present

## 2018-01-17 DIAGNOSIS — Z800 Family history of malignant neoplasm of digestive organs: Secondary | ICD-10-CM | POA: Insufficient documentation

## 2018-01-17 DIAGNOSIS — K219 Gastro-esophageal reflux disease without esophagitis: Secondary | ICD-10-CM | POA: Diagnosis not present

## 2018-01-17 DIAGNOSIS — Z79899 Other long term (current) drug therapy: Secondary | ICD-10-CM | POA: Insufficient documentation

## 2018-01-17 DIAGNOSIS — Z801 Family history of malignant neoplasm of trachea, bronchus and lung: Secondary | ICD-10-CM | POA: Insufficient documentation

## 2018-01-17 DIAGNOSIS — R42 Dizziness and giddiness: Secondary | ICD-10-CM | POA: Insufficient documentation

## 2018-01-17 DIAGNOSIS — Z87440 Personal history of urinary (tract) infections: Secondary | ICD-10-CM | POA: Diagnosis not present

## 2018-01-17 LAB — COMPREHENSIVE METABOLIC PANEL
ALBUMIN: 4.4 g/dL (ref 3.5–5.0)
ALK PHOS: 71 U/L (ref 38–126)
ALT: 14 U/L (ref 0–44)
AST: 20 U/L (ref 15–41)
Anion gap: 11 (ref 5–15)
BUN: 6 mg/dL — AB (ref 8–23)
CO2: 27 mmol/L (ref 22–32)
Calcium: 10.2 mg/dL (ref 8.9–10.3)
Chloride: 103 mmol/L (ref 98–111)
Creatinine, Ser: 0.88 mg/dL (ref 0.44–1.00)
GFR calc Af Amer: >60 mL/min (ref >60)
GFR calc non Af Amer: 59 mL/min — ABNORMAL LOW (ref >60)
GLUCOSE: 92 mg/dL (ref 70–99)
Potassium: 3.7 mmol/L (ref 3.5–5.1)
SODIUM: 141 mmol/L (ref 135–145)
Total Bilirubin: 0.7 mg/dL (ref 0.3–1.2)
Total Protein: 7.8 g/dL (ref 6.5–8.1)

## 2018-01-17 LAB — CBC WITH DIFFERENTIAL/PLATELET
ABS IMMATURE GRANULOCYTES: 0 10*3/uL (ref 0.0–0.1)
Basophils Absolute: 0.1 10*3/uL (ref 0.0–0.1)
Basophils Relative: 1 %
EOS PCT: 1 %
Eosinophils Absolute: 0 10*3/uL (ref 0.0–0.7)
HEMATOCRIT: 44.9 % (ref 36.0–46.0)
HEMOGLOBIN: 13.9 g/dL (ref 12.0–15.0)
IMMATURE GRANULOCYTES: 0 %
LYMPHS PCT: 38 %
Lymphs Abs: 2.3 10*3/uL (ref 0.7–4.0)
MCH: 27.7 pg (ref 26.0–34.0)
MCHC: 31 g/dL (ref 30.0–36.0)
MCV: 89.4 fL (ref 78.0–100.0)
Monocytes Absolute: 0.5 10*3/uL (ref 0.1–1.0)
Monocytes Relative: 9 %
NEUTROS ABS: 3.1 10*3/uL (ref 1.7–7.7)
NEUTROS PCT: 51 %
Platelets: 228 10*3/uL (ref 150–400)
RBC: 5.02 MIL/uL (ref 3.87–5.11)
RDW: 12.8 % (ref 11.5–15.5)
WBC: 6 10*3/uL (ref 4.0–10.5)

## 2018-01-17 LAB — POCT URINALYSIS DIP (DEVICE)
Bilirubin Urine: NEGATIVE
Glucose, UA: NEGATIVE mg/dL
Hgb urine dipstick: NEGATIVE
KETONES UR: NEGATIVE mg/dL
Leukocytes, UA: NEGATIVE
Nitrite: NEGATIVE
PROTEIN: NEGATIVE mg/dL
Specific Gravity, Urine: 1.015 (ref 1.005–1.030)
UROBILINOGEN UA: 0.2 mg/dL (ref 0.0–1.0)
pH: 7 (ref 5.0–8.0)

## 2018-01-17 MED ORDER — ONDANSETRON 4 MG PO TBDP
ORAL_TABLET | ORAL | Status: AC
  Filled 2018-01-17: qty 1

## 2018-01-17 MED ORDER — ONDANSETRON 4 MG PO TBDP
4.0000 mg | ORAL_TABLET | Freq: Once | ORAL | Status: AC
  Administered 2018-01-17: 4 mg via ORAL

## 2018-01-17 MED ORDER — ONDANSETRON 4 MG PO TBDP: 4.0000 mg | ORAL_TABLET | Freq: Three times a day (TID) | ORAL | 0 refills | Status: DC | PRN

## 2018-01-17 NOTE — ED Triage Notes (Signed)
Pt presents today with lower abdominal pain and light headedness that has been going on for a couple of weeks. She states she got the new shingles shot about 2 weeks ago and has been feeling bad since. States she has no urinary sxs but has not been feeling right.

## 2018-01-17 NOTE — Discharge Instructions (Signed)
Your urine is normal today.  Your vitals are well today as well.  We will call you if your labs return with any abnormality which will require closer follow up.  Zofran medication as needed for nausea.  Be mindful as this may cause constipation. Diet as tolerated.  Please follow up with your primary care provider for recheck this week.  If develop worsening of pain, fever, dehydration, weakness, blood in stool, vomiting or otherwise worsening please go to the Er.

## 2018-01-17 NOTE — ED Provider Notes (Signed)
[facility]    CSN: [id number] Arrival date & time: 01/17/18  1002     History   Chief Complaint Chief Complaint  Patient presents with  . Abdominal Pain    HPI Sara [name] is a 84 y.o. female.   Sara [name] presents with complaints of generalized abdominal pain, nausea, loose stools, and feeling "woozy" for the past few weeks. Stools have been improving in consistency. States normal for her to have 2 bm's a day, yesterday had three however. No watery stools just more loose. No known fevers. Has been able to eat and drink, this does make her more nauseated however. No vomiting. No specific cough or URI symptoms.  States she feels anxious intermittently which is not normal for her. Denies any current abdominal pain. States feels similar to previous UTI's she has had. Denies any urinary symptoms. Has been drinking fluids. No known ill contacts. States she has felt this way since receiving her shingles vaccine at her PCP office, which was 7/17. Saw her PCP again 7/29 and was started on claritin for sinus symptoms. Has not taken any medications for her symptoms. States usually takes miralax to prevent constipation, has been taking less of this. Hx of CAD, GERD, htn, OA, CVA, gerd, frequent UTIs. She has had her appendix removed.    ROS per HPI.      Past Medical History:  Diagnosis Date  . Allergy   . CAD (coronary artery disease)   . Clinical trial participant    [facility] studies in familial dementia  . Dyspnea 11/28/2016  . Frequent UTI   . GERD (gastroesophageal reflux disease)   . Hypertension   . Osteoarthritis    hips, back  . Osteopenia   . Pneumonia   . Spinal stenosis   . Stroke [facility]) 1985    Patient Active Problem List   Diagnosis Date Noted  . Mild headache 01/05/2018  . Rhinorrhea 01/05/2018  . Chronic diastolic heart failure ([place]) 12/27/2017  . Left low back pain 03/11/2017  . Diastolic dysfunction 03/50/0938  . Estrogen  deficiency 12/16/2016  . Routine general medical examination at a health care facility 12/16/2016  . CAD (coronary artery disease) 12/04/2016  . Dyspepsia 03/15/2016  . 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 Eczema 03/06/2015  . Hyperglycemia 02/11/2014  . Intertrigo labialis 12/07/2012  . CONSTIPATION, CHRONIC 07/03/2010  . Osteoporosis 06/24/2008  . HYPERCHOLESTEROLEMIA 12/08/2006  . Essential hypertension 12/08/2006  . GERD 12/08/2006  . H/O herpes zoster 12/08/2006    Past Surgical History:  Procedure Laterality Date  . ABDOMINAL HYSTERECTOMY     partial has one ovary  . APPENDECTOMY    . CATARACT EXTRACTION Bilateral   . LEFT HEART CATH AND CORONARY ANGIOGRAPHY N/A 11/29/2016   Procedure: Left Heart Cath and Coronary Angiography;  Surgeon: Sherren Mocha, MD;  Location: [facility];  Service: Cardiovascular;  Laterality: N/A;  . LUMBAR DISC SURGERY     x 3  . rotator cuff surgery Right 2010    OB History   None      Home Medications    Prior to Admission medications   Medication Sig Start Date End Date Taking? Authorizing Provider  aspirin EC 81 MG tablet Take 81 mg by mouth daily.     Yes [provider]  Cholecalciferol (VITAMIN D-3) 1000 UNITS CAPS Take 1,000 Units by mouth daily.    Yes [provider]  diltiazem (CARTIA XT) 120 MG 24 hr capsule Take  1 capsule (120 mg total) by mouth daily. 12/24/17  Yes Tower, Wynelle Fanny, MD  Docusate Sodium (EQ STOOL SOFTENER PO) Take 100 mg by mouth daily as needed (constipation).    Yes [provider]  doxylamine, Sleep, (EQ SLEEP AID) 25 MG tablet Take 25 mg by mouth at bedtime as needed for sleep.    Yes [provider]  hydrochlorothiazide (HYDRODIURIL) 25 MG tablet Take 1 tablet (25 mg total) by mouth daily. 12/24/17  Yes Tower, Wynelle Fanny, MD  nitroGLYCERIN (NITROSTAT) 0.4 MG SL tablet Place 1 tablet (0.4 mg total) under the tongue every 5 (five) minutes as needed for chest pain (max 3 doses  in 15 minutes). 01/02/18  Yes Tower, Wynelle Fanny, MD  polyethylene glycol (MIRALAX / GLYCOLAX) packet Take 17 g by mouth daily.    Yes [provider]  potassium chloride (K-DUR,KLOR-CON) 10 MEQ tablet Take 1 tablet (10 mEq total) by mouth daily. 12/24/17  Yes Tower, Wynelle Fanny, MD  ranitidine (ZANTAC) 150 MG capsule Take 1 capsule (150 mg total) by mouth 2 (two) times daily. 12/24/17  Yes Tower, Wynelle Fanny, MD  ondansetron (ZOFRAN-ODT) 4 MG disintegrating tablet Take 1 tablet (4 mg total) by mouth every 8 (eight) hours as needed for nausea or vomiting. 01/17/18   Zigmund Gottron, NP    Family History Family History  Problem Relation Age of Onset  . Lung cancer Brother   . Other Mother        kidney tumor  . Hypertension Mother   . Diabetes Sister   . Diabetes Brother   . Diabetes Sister        x 2  . Colon cancer Maternal Uncle   . Other Sister        intestine burst  . Breast cancer Maternal Aunt        x 2  . Breast cancer Daughter   . Diabetes Son        x 3  . Diabetes Daughter   . Esophageal cancer Neg Hx   . Rectal cancer Neg Hx   . Stomach cancer Neg Hx     Social History Social History   Tobacco Use  . Smoking status: Never Smoker  . Smokeless tobacco: Never Used  Substance Use Topics  . Alcohol use: No    Alcohol/week: 0.0 standard drinks  . Drug use: No     Allergies   Amoxicillin; Atorvastatin; Cefpodoxime proxetil; Clarithromycin; Fosamax [alendronate sodium]; Furosemide; and Gabapentin   Review of Systems Review of Systems   Physical Exam Triage Vital Signs ED Triage Vitals  Enc Vitals Group     BP 01/17/18 1024 (!) 161/85     Pulse Rate 01/17/18 1024 (!) 59     Resp 01/17/18 1024 16     Temp 01/17/18 1024 98.3 F (36.8 C)     Temp Source 01/17/18 1024 Oral     SpO2 01/17/18 1024 96 %     Weight --      Height --      Head Circumference --      Peak Flow --      Pain Score 01/17/18 1026 4     Pain Loc --      Pain Edu? --      Excl. in  [place]? --    No data found.  Updated Vital Signs BP (!) 161/85 (BP Location: Left Arm)   Pulse (!) 59   Temp 98.3 F (36.8 C) (Oral)  Resp 16   SpO2 96%   Physical Exam  Constitutional: She is oriented to person, place, and time. She appears well-developed and well-nourished. No distress.  HENT:  Head: Normocephalic and atraumatic.  Mouth/Throat: Oropharynx is clear and moist.  Cardiovascular: Normal rate, regular rhythm and normal heart sounds.  Pulmonary/Chest: Effort normal and breath sounds normal.  Abdominal: Soft. Bowel sounds are normal. There is no hepatosplenomegaly. There is tenderness in the right upper quadrant. There is no rigidity, no rebound, no guarding, no CVA tenderness, no tenderness at McBurney's point and negative Murphy's sign.  Mild right upper/mid abdominal pain on palpation   Neurological: She is alert and oriented to person, place, and time. She has normal strength. No sensory deficit.  Skin: Skin is warm and dry.     UC Treatments / Results  Labs (all labs ordered are listed, but only abnormal results are displayed) Labs Reviewed  CBC WITH DIFFERENTIAL/PLATELET  COMPREHENSIVE METABOLIC PANEL  POCT URINALYSIS DIP (DEVICE)    EKG None  Radiology No results found.  Procedures Procedures (including critical care time)  Medications Ordered in UC Medications  ondansetron (ZOFRAN-ODT) disintegrating tablet 4 mg (4 mg Oral Given 01/17/18 1044)    Initial Impression / Assessment and Plan / UC Course  I have reviewed the triage vital signs and the nursing notes.  Pertinent labs & imaging results that were available during my care of the patient were reviewed by me and considered in my medical decision making (see chart for details).     Non specific abdominal exam. Non toxic. Hemodynamically stable here today. Taking po. Ambulatory without difficulty. Afebrile. ua without acute findings. CBC and CMP pending. Will notify patient of any concerning  results. zofran as needed. Encouraged follow up with PCp in the next week for recheck. Return precautions provided. Patient verbalized understanding and agreeable to plan.  Ambulatory out of clinic without difficulty.   Final Clinical Impressions(s) / UC Diagnoses   Final diagnoses:  Generalized abdominal pain  Nausea     Discharge Instructions     Your urine is normal today.  Your vitals are well today as well.  We will call you if your labs return with any abnormality which will require closer follow up.  Zofran medication as needed for nausea.  Be mindful as this may cause constipation. Diet as tolerated.  Please follow up with your primary care provider for recheck this week.  If develop worsening of pain, fever, dehydration, weakness, blood in stool, vomiting or otherwise worsening please go to the Er.     ED Prescriptions    Medication Sig Dispense Auth. Provider   ondansetron (ZOFRAN-ODT) 4 MG disintegrating tablet Take 1 tablet (4 mg total) by mouth every 8 (eight) hours as needed for nausea or vomiting. 12 tablet Zigmund Gottron, NP     Controlled Substance Prescriptions [place] Controlled Substance Registry consulted? Not Applicable   Zigmund Gottron, NP 01/17/18 1054

## 2018-07-01 ENCOUNTER — Ambulatory Visit (INDEPENDENT_AMBULATORY_CARE_PROVIDER_SITE_OTHER): Payer: Medicare HMO | Admitting: Family Medicine

## 2018-07-01 ENCOUNTER — Encounter: Payer: Self-pay | Admitting: Family Medicine

## 2018-07-01 VITALS — BP 122/78 | HR 51 | Temp 98.1°F | Ht 62.5 in | Wt 151.5 lb

## 2018-07-01 DIAGNOSIS — R0600 Dyspnea, unspecified: Secondary | ICD-10-CM | POA: Diagnosis not present

## 2018-07-01 DIAGNOSIS — H9202 Otalgia, left ear: Secondary | ICD-10-CM | POA: Diagnosis not present

## 2018-07-01 DIAGNOSIS — R109 Unspecified abdominal pain: Secondary | ICD-10-CM

## 2018-07-01 DIAGNOSIS — R001 Bradycardia, unspecified: Secondary | ICD-10-CM | POA: Diagnosis not present

## 2018-07-01 DIAGNOSIS — N3000 Acute cystitis without hematuria: Secondary | ICD-10-CM | POA: Diagnosis not present

## 2018-07-01 DIAGNOSIS — N390 Urinary tract infection, site not specified: Secondary | ICD-10-CM | POA: Insufficient documentation

## 2018-07-01 DIAGNOSIS — I5032 Chronic diastolic (congestive) heart failure: Secondary | ICD-10-CM | POA: Diagnosis not present

## 2018-07-01 DIAGNOSIS — I10 Essential (primary) hypertension: Secondary | ICD-10-CM | POA: Diagnosis not present

## 2018-07-01 LAB — POC URINALSYSI DIPSTICK (AUTOMATED)
BILIRUBIN UA: NEGATIVE
Blood, UA: NEGATIVE
GLUCOSE UA: NEGATIVE
Ketones, UA: NEGATIVE
Nitrite, UA: NEGATIVE
Protein, UA: NEGATIVE
Spec Grav, UA: 1.02 (ref 1.010–1.025)
UROBILINOGEN UA: 0.2 U/dL
pH, UA: 6 (ref 5.0–8.0)

## 2018-07-01 MED ORDER — SULFAMETHOXAZOLE-TRIMETHOPRIM 800-160 MG PO TABS: 1.0000 | ORAL_TABLET | Freq: Two times a day (BID) | ORAL | 0 refills | Status: DC

## 2018-07-01 NOTE — Assessment & Plan Note (Signed)
Chronic Unchanged Pt has seen cardiology (has DD) Was unable to tolerate pulm fxn tests  Reassuring exam today

## 2018-07-01 NOTE — Progress Notes (Signed)
Subjective:    Patient ID: Sara [name], female    DOB: [birth year], 85 y.o.   MRN: [id number]  HPI 85 yo pt with hx of spinal stenosis and diastolic dysfunction here with flank pain  And sob (pulse is slow today)  She has chronic sob  Some days worse than others  No chest pain  Wears out easily   Last saw cardiology a year ago  H/o minimal CAD and mild anterolat wall hypokinesis and DD  She tried PFTs in the past for sob (chronic) but could not tolerate the test Cardiology urged her to work on conditioning   Wt Readings from Last 3 Encounters:  07/01/18 151 lb 8 oz (68.7 kg)  01/05/18 149 lb (67.6 kg)  12/24/17 151 lb 8 oz (68.7 kg)   27.27 kg/m    BP Readings from Last 3 Encounters:  07/01/18 122/78  01/17/18 (!) 161/85  01/05/18 124/76   Pulse Readings from Last 3 Encounters:  07/01/18 (!) 36  01/17/18 (!) 59  01/05/18 (!) 54  suspect pulse today is incorrect - 50s on re check EKG - rate of 58 with frequent PVC and evidence of old anteroseptal infarct (similar to past)   ua is pos for leuk today  Results for orders placed or performed in visit on 07/01/18  POCT Urinalysis Dipstick (Automated)  Result Value Ref Range   Color, UA Yellow    Clarity, UA Cloudy    Glucose, UA Negative Negative   Bilirubin, UA Negative    Ketones, UA Negative    Spec Grav, UA 1.020 1.010 - 1.025   Blood, UA Negative    pH, UA 6.0 5.0 - 8.0   Protein, UA Negative Negative   Urobilinogen, UA 0.2 0.2 or 1.0 E.U./dL   Nitrite, UA Negative    Leukocytes, UA Large (3+) (A) Negative     L flank pain started when she woke up on Saturday  No burning to urinate Frequency at night - more lately  No blood in urine   Side pain is a little worse to stand still    Ear hurts on L also  No rash  occ runny nose   Patient Active Problem List   Diagnosis Date Noted  . Bradycardia 07/01/2018  . Dyspnea 07/01/2018  . UTI (urinary tract infection) 07/01/2018  . Mild  headache 01/05/2018  . Rhinorrhea 01/05/2018  . Chronic diastolic heart failure ([place]) 12/27/2017  . Left low back pain 03/11/2017  . Diastolic dysfunction 84/16/6063  . Estrogen deficiency 12/16/2016  . Routine general medical examination at a health care facility 12/16/2016  . CAD (coronary artery disease) 12/04/2016  . Dyspepsia 03/15/2016  . 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 Eczema 03/06/2015  . Hyperglycemia 02/11/2014  . Intertrigo labialis 12/07/2012  . CONSTIPATION, CHRONIC 07/03/2010  . Osteoporosis 06/24/2008  . HYPERCHOLESTEROLEMIA 12/08/2006  . Essential hypertension 12/08/2006  . GERD 12/08/2006  . H/O herpes zoster 12/08/2006   Past Medical History:  Diagnosis Date  . Allergy   . CAD (coronary artery disease)   . Clinical trial participant    [facility] studies in familial dementia  . Dyspnea 11/28/2016  . Frequent UTI   . GERD (gastroesophageal reflux disease)   . Hypertension   . Osteoarthritis    hips, back  . Osteopenia   . Pneumonia   . Spinal stenosis   . Stroke [facility]) 1985   Past Surgical History:  Procedure Laterality Date  . ABDOMINAL HYSTERECTOMY  partial has one ovary  . APPENDECTOMY    . CATARACT EXTRACTION Bilateral   . LEFT HEART CATH AND CORONARY ANGIOGRAPHY N/A 11/29/2016   Procedure: Left Heart Cath and Coronary Angiography;  Surgeon: Sherren Mocha, MD;  Location: [facility];  Service: Cardiovascular;  Laterality: N/A;  . LUMBAR DISC SURGERY     x 3  . rotator cuff surgery Right 2010   Social History   Tobacco Use  . Smoking status: Never Smoker  . Smokeless tobacco: Never Used  Substance Use Topics  . Alcohol use: No    Alcohol/week: 0.0 standard drinks  . Drug use: No   Family History  Problem Relation Age of Onset  . Lung cancer Brother   . Other Mother        kidney tumor  . Hypertension Mother   . Diabetes Sister   . Diabetes Brother   . Diabetes Sister        x 2  . Colon cancer Maternal Uncle     . Other Sister        intestine burst  . Breast cancer Maternal Aunt        x 2  . Breast cancer Daughter   . Diabetes Son        x 3  . Diabetes Daughter   . Esophageal cancer Neg Hx   . Rectal cancer Neg Hx   . Stomach cancer Neg Hx    Allergies  Allergen Reactions  . Amoxicillin     Reaction not known  . Atorvastatin     aching  . Cefpodoxime Proxetil     Reaction not known  . Clarithromycin     Reaction not known  . Fosamax [Alendronate Sodium]     Body pain   . Furosemide     REACTION: doesn't help  . Gabapentin Swelling    Caused severe swellling   Current Outpatient Medications on File Prior to Visit  Medication Sig Dispense Refill  . aspirin EC 81 MG tablet Take 81 mg by mouth daily.      . Cholecalciferol (VITAMIN D-3) 1000 UNITS CAPS Take 1,000 Units by mouth daily.     Marland Kitchen diltiazem (CARTIA XT) 120 MG 24 hr capsule Take 1 capsule (120 mg total) by mouth daily. 90 capsule 3  . Docusate Sodium (EQ STOOL SOFTENER PO) Take 100 mg by mouth daily as needed (constipation).     Marland Kitchen doxylamine, Sleep, (EQ SLEEP AID) 25 MG tablet Take 25 mg by mouth at bedtime as needed for sleep.     . hydrochlorothiazide (HYDRODIURIL) 25 MG tablet Take 1 tablet (25 mg total) by mouth daily. 90 tablet 3  . nitroGLYCERIN (NITROSTAT) 0.4 MG SL tablet Place 1 tablet (0.4 mg total) under the tongue every 5 (five) minutes as needed for chest pain (max 3 doses in 15 minutes). 25 tablet 3  . ondansetron (ZOFRAN-ODT) 4 MG disintegrating tablet Take 1 tablet (4 mg total) by mouth every 8 (eight) hours as needed for nausea or vomiting. 12 tablet 0  . polyethylene glycol (MIRALAX / GLYCOLAX) packet Take 17 g by mouth daily.     . potassium chloride (K-DUR,KLOR-CON) 10 MEQ tablet Take 1 tablet (10 mEq total) by mouth daily. 90 tablet 3  . ranitidine (ZANTAC) 150 MG capsule Take 1 capsule (150 mg total) by mouth 2 (two) times daily. 180 capsule 3   No current facility-administered medications on file  prior to visit.  Review of Systems  Constitutional: Negative for activity change, appetite change, fatigue, fever and unexpected weight change.  HENT: Negative for congestion, ear pain, rhinorrhea, sinus pressure and sore throat.   Eyes: Negative for pain, redness and visual disturbance.  Respiratory: Positive for shortness of breath. Negative for cough, wheezing and stridor.   Cardiovascular: Negative for chest pain, palpitations and leg swelling.  Gastrointestinal: Negative for abdominal pain, blood in stool, constipation and diarrhea.  Endocrine: Negative for polydipsia and polyuria.  Genitourinary: Positive for flank pain, frequency and urgency. Negative for dysuria.       Flank pain   Musculoskeletal: Positive for back pain. Negative for arthralgias and myalgias.  Skin: Negative for pallor and rash.  Allergic/Immunologic: Negative for environmental allergies.  Neurological: Negative for dizziness, syncope and headaches.  Hematological: Negative for adenopathy. Does not bruise/bleed easily.  Psychiatric/Behavioral: Negative for decreased concentration and dysphoric mood. The patient is not nervous/anxious.        Objective:   Physical Exam Constitutional:      General: She is not in acute distress.    Appearance: Normal appearance. She is well-developed and normal weight. She is not ill-appearing or diaphoretic.  HENT:     Head: Normocephalic and atraumatic.     Right Ear: Tympanic membrane and ear canal normal.     Left Ear: Tympanic membrane and ear canal normal.     Nose: Rhinorrhea present.     Comments: Boggy nares  Eyes:     General: No scleral icterus.    Conjunctiva/sclera: Conjunctivae normal.     Pupils: Pupils are equal, round, and reactive to light.  Neck:     Musculoskeletal: Normal range of motion and neck supple.     Thyroid: No thyromegaly.     Vascular: No carotid bruit or JVD.  Cardiovascular:     Rate and Rhythm: Regular rhythm. Bradycardia  present.     Pulses: Normal pulses.     Heart sounds: Normal heart sounds. No gallop.   Pulmonary:     Effort: Pulmonary effort is normal. No respiratory distress.     Breath sounds: Normal breath sounds. No stridor. No wheezing or rales.  Abdominal:     General: Bowel sounds are normal. There is no distension or abdominal bruit.     Palpations: Abdomen is soft. There is no mass.     Tenderness: There is no abdominal tenderness. There is left CVA tenderness.     Comments: Very mild tenderness in L CVA area   Mild suprapubic tenderness  Musculoskeletal:        General: No tenderness.     Right lower leg: No edema.     Left lower leg: No edema.  Lymphadenopathy:     Cervical: No cervical adenopathy.  Skin:    General: Skin is warm and dry.     Findings: No rash.  Neurological:     General: No focal deficit present.     Mental Status: She is alert.     Deep Tendon Reflexes: Reflexes are normal and symmetric.  Psychiatric:        Mood and Affect: Mood normal.     Comments: Good mood Talkative            Assessment & Plan:   Problem List Items Addressed This Visit      Cardiovascular and Mediastinum   Essential hypertension    bp in fair control at this time  BP Readings from Last 1 Encounters:  07/01/18 122/78  No changes needed Most recent labs reviewed  Disc lifstyle change with low sodium diet and exercise        Chronic diastolic heart failure (HCC)    Low pulse on presentation today- mild bradycardia on re check and EKG No pedal edema  No more sob than usual (exertional-chronic) Due for cardiology f/u -ref made      Relevant Orders   EKG 12-Lead (Completed)   Ambulatory referral to Cardiology     Genitourinary   UTI (urinary tract infection)    With L flank pain and urinary frequency  tx with bactrim DS Enc better water intake Pos ua  Pend culture       Relevant Medications   sulfamethoxazole-trimethoprim (BACTRIM DS,SEPTRA DS) 800-160 MG  tablet   Other Relevant Orders   Urine Culture     Other   Bradycardia    First pulse today in 30s- erroneous (50s on re check) her baseline Rate of 58 on EKG Taking diltiazem -will not adjust at this time Due for cardiology f/u      Relevant Orders   EKG 12-Lead (Completed)   Ambulatory referral to Cardiology   Dyspnea    Chronic Unchanged Pt has seen cardiology (has DD) Was unable to tolerate pulm fxn tests  Reassuring exam today      Relevant Orders   EKG 12-Lead (Completed)   Ambulatory referral to Cardiology   Left ear pain    Nl exam today  Comes and goes May be ETD Enc to use chlorcedin prn as needed  Update if not starting to improve in a week or if worsening         Other Visit Diagnoses    Left flank pain    -  Primary   Relevant Orders   POCT Urinalysis Dipstick (Automated) (Completed)

## 2018-07-01 NOTE — Assessment & Plan Note (Signed)
Low pulse on presentation today- mild bradycardia on re check and EKG No pedal edema  No more sob than usual (exertional-chronic) Due for cardiology f/u -ref made

## 2018-07-01 NOTE — Assessment & Plan Note (Signed)
bp in fair control at this time  BP Readings from Last 1 Encounters:  07/01/18 122/78   No changes needed Most recent labs reviewed  Disc lifstyle change with low sodium diet and exercise

## 2018-07-01 NOTE — Assessment & Plan Note (Signed)
Nl exam today  Comes and goes May be ETD Enc to use chlorcedin prn as needed  Update if not starting to improve in a week or if worsening

## 2018-07-01 NOTE — Assessment & Plan Note (Signed)
First pulse today in 30s- erroneous (50s on re check) her baseline Rate of 58 on EKG Taking diltiazem -will not adjust at this time Due for cardiology f/u

## 2018-07-01 NOTE — Assessment & Plan Note (Signed)
With L flank pain and urinary frequency  tx with bactrim DS Enc better water intake Pos ua  Pend culture

## 2018-07-01 NOTE — Patient Instructions (Signed)
I think you have a uti  Drink more water  Take the bactrim as directed  We will also culture urine   We will do a referral for your cardiology follow up  Pulse was low - was better with EKG however  If your shortness of breath worsens in the meantime please let us know

## 2018-07-03 LAB — URINE CULTURE
MICRO NUMBER:: 89260
SPECIMEN QUALITY:: ADEQUATE

## 2018-07-20 ENCOUNTER — Encounter: Payer: Self-pay | Admitting: Family Medicine

## 2018-07-20 ENCOUNTER — Ambulatory Visit (INDEPENDENT_AMBULATORY_CARE_PROVIDER_SITE_OTHER): Payer: Medicare HMO | Admitting: Family Medicine

## 2018-07-20 VITALS — BP 136/76 | HR 55 | Temp 98.1°F | Ht 62.5 in

## 2018-07-20 DIAGNOSIS — R109 Unspecified abdominal pain: Secondary | ICD-10-CM | POA: Diagnosis not present

## 2018-07-20 DIAGNOSIS — R1032 Left lower quadrant pain: Secondary | ICD-10-CM | POA: Insufficient documentation

## 2018-07-20 LAB — CBC WITH DIFFERENTIAL/PLATELET
BASOS PCT: 1 % (ref 0.0–3.0)
Basophils Absolute: 0.1 10*3/uL (ref 0.0–0.1)
EOS PCT: 0.7 % (ref 0.0–5.0)
Eosinophils Absolute: 0 10*3/uL (ref 0.0–0.7)
HCT: 41.8 % (ref 36.0–46.0)
Hemoglobin: 13.5 g/dL (ref 12.0–15.0)
LYMPHS ABS: 2.1 10*3/uL (ref 0.7–4.0)
Lymphocytes Relative: 32 % (ref 12.0–46.0)
MCHC: 32.2 g/dL (ref 30.0–36.0)
MCV: 87.5 fl (ref 78.0–100.0)
MONOS PCT: 10.9 % (ref 3.0–12.0)
Monocytes Absolute: 0.7 10*3/uL (ref 0.1–1.0)
Neutro Abs: 3.7 10*3/uL (ref 1.4–7.7)
Neutrophils Relative %: 55.4 % (ref 43.0–77.0)
Platelets: 220 10*3/uL (ref 150.0–400.0)
RBC: 4.78 Mil/uL (ref 3.87–5.11)
RDW: 13.5 % (ref 11.5–15.5)
WBC: 6.6 10*3/uL (ref 4.0–10.5)

## 2018-07-20 LAB — POC URINALSYSI DIPSTICK (AUTOMATED)
Bilirubin, UA: NEGATIVE
Blood, UA: NEGATIVE
GLUCOSE UA: NEGATIVE
KETONES UA: NEGATIVE
LEUKOCYTES UA: NEGATIVE
Nitrite, UA: NEGATIVE
PROTEIN UA: NEGATIVE
Spec Grav, UA: 1.02 (ref 1.010–1.025)
Urobilinogen, UA: 0.2 E.U./dL
pH, UA: 6 (ref 5.0–8.0)

## 2018-07-20 LAB — COMPREHENSIVE METABOLIC PANEL
ALBUMIN: 4.4 g/dL (ref 3.5–5.2)
ALK PHOS: 69 U/L (ref 39–117)
ALT: 10 U/L (ref 0–35)
AST: 16 U/L (ref 0–37)
BUN: 17 mg/dL (ref 6–23)
CALCIUM: 10 mg/dL (ref 8.4–10.5)
CO2: 30 mEq/L (ref 19–32)
Chloride: 102 mEq/L (ref 96–112)
Creatinine, Ser: 0.87 mg/dL (ref 0.40–1.20)
GFR: 75.02 mL/min (ref >60.00)
Glucose, Bld: 89 mg/dL (ref 70–99)
POTASSIUM: 3.7 meq/L (ref 3.5–5.1)
Sodium: 139 mEq/L (ref 135–145)
TOTAL PROTEIN: 7.3 g/dL (ref 6.0–8.3)
Total Bilirubin: 0.5 mg/dL (ref 0.2–1.2)

## 2018-07-20 NOTE — Progress Notes (Signed)
Subjective:    Patient ID: Sara [name], female    DOB: [birth year], 85 y.o.   MRN: [id number]  HPI Here for flank and lower abdominal pain  Now flank pain is resolved but LLQ abd pain continues   She was seen for uti with flank pain on 1/22 tx with bactrim DS for this  Her urine culture grew out coag neg staph -no sensitivities were done  Flank pain got better but not low abd pain     Lab Results  Component Value Date   CREATININE 0.88 01/17/2018   BUN 6 (L) 01/17/2018   NA 141 01/17/2018   K 3.7 01/17/2018   CL 103 01/17/2018   CO2 27 01/17/2018    Her bilateral low abdomen  Wonders if it could be coming from her back (has disc dz ) Pain is a soreness/mostly on the left side /on right sometimes   No fever or chills  No n/v at all   Still baseline sob- sees cardiology next mo   Lab Results  Component Value Date   WBC 6.0 01/17/2018   HGB 13.9 01/17/2018   HCT 44.9 01/17/2018   MCV 89.4 01/17/2018   PLT 228 01/17/2018     Had constipation on CT 2017 Now bowels move well daily using miralax  No diarrhea  No blood in her stool   No urinary symptoms at all  No flank pain   Using tylenol  Also cold compress  ua today Results for orders placed or performed in visit on 07/20/18  POCT Urinalysis Dipstick (Automated)  Result Value Ref Range   Color, UA Light Yellow    Clarity, UA Clear    Glucose, UA Negative Negative   Bilirubin, UA Negative    Ketones, UA Negative    Spec Grav, UA 1.020 1.010 - 1.025   Blood, UA Negative    pH, UA 6.0 5.0 - 8.0   Protein, UA Negative Negative   Urobilinogen, UA 0.2 0.2 or 1.0 E.U./dL   Nitrite, UA Negative    Leukocytes, UA Negative Negative      Had attempted colonoscopy 2015- and they could not get past sigmoid colon  Did cologuard instead that was normal then   Just lost her daughter to a brain (? Tumor) - died after surgery  Sick for years   Patient Active Problem List   Diagnosis Date Noted    . Abdominal pain, left lower quadrant 07/20/2018  . Bradycardia 07/01/2018  . Dyspnea 07/01/2018  . Left ear pain 07/01/2018  . Mild headache 01/05/2018  . Rhinorrhea 01/05/2018  . Chronic diastolic heart failure ([place]) 12/27/2017  . Left low back pain 03/11/2017  . Diastolic dysfunction 32/99/2426  . Estrogen deficiency 12/16/2016  . Routine general medical examination at a health care facility 12/16/2016  . CAD (coronary artery disease) 12/04/2016  . Dyspepsia 03/15/2016  . 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 Eczema 03/06/2015  . Hyperglycemia 02/11/2014  . Intertrigo labialis 12/07/2012  . CONSTIPATION, CHRONIC 07/03/2010  . Osteoporosis 06/24/2008  . HYPERCHOLESTEROLEMIA 12/08/2006  . Essential hypertension 12/08/2006  . GERD 12/08/2006  . H/O herpes zoster 12/08/2006   Past Medical History:  Diagnosis Date  . Allergy   . CAD (coronary artery disease)   . Clinical trial participant    [facility] studies in familial dementia  . Dyspnea 11/28/2016  . Frequent UTI   . GERD (gastroesophageal reflux disease)   . Hypertension   . Osteoarthritis  hips, back  . Osteopenia   . Pneumonia   . Spinal stenosis   . Stroke [facility]) 1985   Past Surgical History:  Procedure Laterality Date  . ABDOMINAL HYSTERECTOMY     partial has one ovary  . APPENDECTOMY    . CATARACT EXTRACTION Bilateral   . LEFT HEART CATH AND CORONARY ANGIOGRAPHY N/A 11/29/2016   Procedure: Left Heart Cath and Coronary Angiography;  Surgeon: Sherren Mocha, MD;  Location: [facility];  Service: Cardiovascular;  Laterality: N/A;  . LUMBAR DISC SURGERY     x 3  . rotator cuff surgery Right 2010   Social History   Tobacco Use  . Smoking status: Never Smoker  . Smokeless tobacco: Never Used  Substance Use Topics  . Alcohol use: No    Alcohol/week: 0.0 standard drinks  . Drug use: No   Family History  Problem Relation Age of Onset  . Lung cancer Brother   . Other Mother        kidney  tumor  . Hypertension Mother   . Diabetes Sister   . Diabetes Brother   . Diabetes Sister        x 2  . Colon cancer Maternal Uncle   . Other Sister        intestine burst  . Breast cancer Maternal Aunt        x 2  . Breast cancer Daughter   . Diabetes Son        x 3  . Diabetes Daughter   . Esophageal cancer Neg Hx   . Rectal cancer Neg Hx   . Stomach cancer Neg Hx    Allergies  Allergen Reactions  . Amoxicillin     Reaction not known  . Atorvastatin     aching  . Cefpodoxime Proxetil     Reaction not known  . Clarithromycin     Reaction not known  . Fosamax [Alendronate Sodium]     Body pain   . Furosemide     REACTION: doesn't help  . Gabapentin Swelling    Caused severe swellling   Current Outpatient Medications on File Prior to Visit  Medication Sig Dispense Refill  . aspirin EC 81 MG tablet Take 81 mg by mouth daily.      . Cholecalciferol (VITAMIN D-3) 1000 UNITS CAPS Take 1,000 Units by mouth daily.     Marland Kitchen diltiazem (CARTIA XT) 120 MG 24 hr capsule Take 1 capsule (120 mg total) by mouth daily. 90 capsule 3  . Docusate Sodium (EQ STOOL SOFTENER PO) Take 100 mg by mouth daily as needed (constipation).     Marland Kitchen doxylamine, Sleep, (EQ SLEEP AID) 25 MG tablet Take 25 mg by mouth at bedtime as needed for sleep.     . hydrochlorothiazide (HYDRODIURIL) 25 MG tablet Take 1 tablet (25 mg total) by mouth daily. 90 tablet 3  . nitroGLYCERIN (NITROSTAT) 0.4 MG SL tablet Place 1 tablet (0.4 mg total) under the tongue every 5 (five) minutes as needed for chest pain (max 3 doses in 15 minutes). 25 tablet 3  . ondansetron (ZOFRAN-ODT) 4 MG disintegrating tablet Take 1 tablet (4 mg total) by mouth every 8 (eight) hours as needed for nausea or vomiting. 12 tablet 0  . polyethylene glycol (MIRALAX / GLYCOLAX) packet Take 17 g by mouth daily.     . potassium chloride (K-DUR,KLOR-CON) 10 MEQ tablet Take 1 tablet (10 mEq total) by mouth daily. 90 tablet 3  . ranitidine (  ZANTAC) 150 MG  capsule Take 1 capsule (150 mg total) by mouth 2 (two) times daily. 180 capsule 3   No current facility-administered medications on file prior to visit.     Review of Systems  Constitutional: Negative for activity change, appetite change, fatigue, fever and unexpected weight change.  HENT: Negative for congestion, ear pain, rhinorrhea, sinus pressure and sore throat.   Eyes: Negative for pain, redness and visual disturbance.  Respiratory: Negative for cough, shortness of breath and wheezing.   Cardiovascular: Negative for chest pain and palpitations.  Gastrointestinal: Positive for abdominal pain. Negative for abdominal distention, anal bleeding, blood in stool, constipation, diarrhea, nausea, rectal pain and vomiting.  Endocrine: Negative for polydipsia and polyuria.  Genitourinary: Negative for dysuria, frequency, pelvic pain and urgency.  Musculoskeletal: Negative for arthralgias, back pain and myalgias.  Skin: Negative for pallor and rash.  Allergic/Immunologic: Negative for environmental allergies.  Neurological: Negative for dizziness, syncope and headaches.  Hematological: Negative for adenopathy. Does not bruise/bleed easily.  Psychiatric/Behavioral: Negative for decreased concentration and dysphoric mood. The patient is not nervous/anxious.        Objective:   Physical Exam Constitutional:      General: She is not in acute distress.    Appearance: She is well-developed and normal weight. She is not ill-appearing or diaphoretic.  HENT:     Head: Normocephalic and atraumatic.  Eyes:     General: No scleral icterus.    Conjunctiva/sclera: Conjunctivae normal.     Pupils: Pupils are equal, round, and reactive to light.  Neck:     Musculoskeletal: Normal range of motion and neck supple.  Cardiovascular:     Rate and Rhythm: Normal rate and regular rhythm.     Heart sounds: Normal heart sounds.  Pulmonary:     Effort: Pulmonary effort is normal. No respiratory distress.      Breath sounds: Normal breath sounds. No wheezing or rales.  Abdominal:     General: Abdomen is flat. Bowel sounds are normal. There is no distension or abdominal bruit. There are no signs of injury.     Palpations: Abdomen is soft. There is no hepatomegaly, splenomegaly, mass or pulsatile mass.     Tenderness: There is abdominal tenderness in the left lower quadrant. There is no right CVA tenderness, left CVA tenderness, guarding or rebound. Negative signs include Murphy's sign, McBurney's sign and psoas sign.     Hernia: No hernia is present.  Musculoskeletal:     Right lower leg: No edema.     Left lower leg: No edema.  Lymphadenopathy:     Cervical: No cervical adenopathy.  Skin:    General: Skin is warm and dry.     Coloration: Skin is not pale.     Findings: No erythema.  Neurological:     Mental Status: She is alert. Mental status is at baseline.  Psychiatric:        Mood and Affect: Mood normal.           Assessment & Plan:   Problem List Items Addressed This Visit      Other   Abdominal pain, left lower quadrant - Primary    Per pt worse when standing (both sides but worse on L)  Some improvement with cold compress  No constipation -daily bm with miralax  No urinary symptoms and neg ua  In past- unable to get scope through sigmoid colon so she had cologuard (neg)  Disc poss of diverticular dz Lab today  for cmet and cbc  Considering CT scan (had one in 2017- showed stool burden and appendix mucocele       Relevant Orders   CBC with Differential/Platelet   Comprehensive metabolic panel    Other Visit Diagnoses    Flank pain       Relevant Orders   POCT Urinalysis Dipstick (Automated) (Completed)

## 2018-07-20 NOTE — Patient Instructions (Signed)
Use either heat or cold on sore area- whichever works better   [employer] today (looking for evidence of diverticular infection)   We may end up checking another CT scan   We will decide when we get results  If symptoms suddenly worsen (severe) or fever- alert [place] and get to the hospital

## 2018-07-20 NOTE — Assessment & Plan Note (Signed)
Per pt worse when standing (both sides but worse on L)  Some improvement with cold compress  No constipation -daily bm with miralax  No urinary symptoms and neg ua  In past- unable to get scope through sigmoid colon so she had cologuard (neg)  Disc poss of diverticular dz Lab today for cmet and cbc  Considering CT scan (had one in 2017- showed stool burden and appendix mucocele

## 2018-07-21 ENCOUNTER — Telehealth: Payer: Self-pay | Admitting: Family Medicine

## 2018-07-21 DIAGNOSIS — R1084 Generalized abdominal pain: Secondary | ICD-10-CM

## 2018-07-21 DIAGNOSIS — R1032 Left lower quadrant pain: Secondary | ICD-10-CM

## 2018-07-21 NOTE — Telephone Encounter (Signed)
-----   Message from [name], [place] sent at 07/21/2018  1:01 PM EST ----- Pt notified of lab results and Dr. Marliss Coots comments. Pt does agree with CT scan because she said the pain has spread to her whole lower abd not just the left side now. I advise pt our [facility] will call to schedule appt

## 2018-07-21 NOTE — Telephone Encounter (Signed)
CT scheduled and patient is aware.

## 2018-07-28 ENCOUNTER — Ambulatory Visit
Admission: RE | Admit: 2018-07-28 | Discharge: 2018-07-28 | Disposition: A | Discharge status: Home / Self Care | Payer: Medicare HMO | Source: Ambulatory Visit | Attending: Family Medicine | Admitting: Family Medicine

## 2018-07-28 DIAGNOSIS — R1084 Generalized abdominal pain: Secondary | ICD-10-CM

## 2018-07-28 DIAGNOSIS — K4090 Unilateral inguinal hernia, without obstruction or gangrene, not specified as recurrent: Secondary | ICD-10-CM | POA: Diagnosis not present

## 2018-07-28 MED ORDER — IOPAMIDOL (ISOVUE-300) INJECTION 61%
100.0000 mL | Freq: Once | INTRAVENOUS | Status: AC | PRN
  Administered 2018-07-28: 100 mL via INTRAVENOUS

## 2018-07-29 ENCOUNTER — Ambulatory Visit (INDEPENDENT_AMBULATORY_CARE_PROVIDER_SITE_OTHER)
Admission: RE | Admit: 2018-07-29 | Discharge: 2018-07-29 | Disposition: A | Discharge status: Home / Self Care | Payer: Medicare HMO | Source: Ambulatory Visit | Attending: Family Medicine | Admitting: Family Medicine

## 2018-07-29 ENCOUNTER — Telehealth: Payer: Self-pay | Admitting: Family Medicine

## 2018-07-29 DIAGNOSIS — M160 Bilateral primary osteoarthritis of hip: Secondary | ICD-10-CM | POA: Diagnosis not present

## 2018-07-29 DIAGNOSIS — M1612 Unilateral primary osteoarthritis, left hip: Secondary | ICD-10-CM | POA: Insufficient documentation

## 2018-07-29 DIAGNOSIS — R1032 Left lower quadrant pain: Secondary | ICD-10-CM

## 2018-07-29 NOTE — Telephone Encounter (Signed)
I wonder if this may be her hip arthritis - I looked back and she had mild to moderate arthritis in L hip back in 2012 so it could be worsening   Is the pain worse to move or walk  Is she open to having an xray ?   Any numbness in her leg? Is her low back hurting?   thanks

## 2018-07-29 NOTE — Telephone Encounter (Signed)
Pt had a CT scan yesterday and stated she is in a lot of pain. Pt want a prescription called in.    Sent to [facility]

## 2018-07-29 NOTE — Telephone Encounter (Signed)
Advised pt of CT scan, pt said her pain is worse. It's not as much in her abd area not it's on the left side groin area and now its radiating down to her left leg pt said the pain is pretty severe

## 2018-07-29 NOTE — Telephone Encounter (Signed)
Called twice and both times line was busy, I will try to call back later

## 2018-07-29 NOTE — Telephone Encounter (Signed)
Pt notified of Dr. Marliss Coots comments. Pt said that her pain does worsen when she is walking she said she thinks it could be her arthritis because she does have lower back pain and also tingling and numbness in her left leg. Pt said she is opened to coming in for an xray but she doesn't drive so she has to check with her family to see when she can come in. Pt will see if they can bring her before the bad weather that is suppose to come in either later today or tomorrow morning

## 2018-07-29 NOTE — Telephone Encounter (Signed)
I put a hip xray order in for when she comes in  Thanks

## 2018-07-30 ENCOUNTER — Telehealth: Payer: Self-pay | Admitting: Family Medicine

## 2018-07-30 DIAGNOSIS — R1032 Left lower quadrant pain: Secondary | ICD-10-CM

## 2018-07-30 MED ORDER — ACETAMINOPHEN-CODEINE #3 300-30 MG PO TABS: 1.0000 | ORAL_TABLET | Freq: Four times a day (QID) | ORAL | 0 refills | Status: DC | PRN

## 2018-07-30 NOTE — Telephone Encounter (Signed)
-----   Message from [name], [place] sent at 07/30/2018 10:55 AM EST ----- Pt notified of xray results and Dr. Marliss Coots comments. Pt said she is still in a lot of pain so she agrees with referral to ortho, she ? What she can do for the pain until she is seen by ortho

## 2018-07-30 NOTE — Telephone Encounter (Signed)
Called patient and sent Referral over to [facility].

## 2018-07-30 NOTE — Addendum Note (Signed)
Addended by: Loura Pardon A on: 07/30/2018 01:24 PM   Modules accepted: Orders

## 2018-07-30 NOTE — Telephone Encounter (Signed)
Pt said she's been taking 2 tylenol every 4-6 hrs and it's not helping the pain at all. Pt said she doesn't think she can take the nsaids because they are hard on her stomach. Pt said she is in so much pain she question if she can  Take 3 tylenol tabs if not what else can she do to help with the pain

## 2018-07-30 NOTE — Telephone Encounter (Signed)
Pt notified Rx sent I advised pt of DR. Tower's instructions and warnings and pt verbalized understanding

## 2018-07-30 NOTE — Telephone Encounter (Signed)
I did the referral   Is she taking tylenol ?(can take 1-2 pills every 4-6 hours)  Can she take low doses of ibuprofen or aleve without GI side effects ?   (she may not know)  I generally avoid nsaids in this age group but sometimes short courses are ok   Thanks-let me know

## 2018-07-30 NOTE — Telephone Encounter (Signed)
She can try tylenol #3 with caution (sedation and falls)  I sent some to her local pharmacy  1 pill up to every 6 hours prn  Do not take more tylenol with it  Watch out for sedation or dizziness  Can also cause constipation (stool softener or miralax if needed)  Keep me posted

## 2018-08-03 ENCOUNTER — Emergency Department (HOSPITAL_COMMUNITY)
Admission: EM | Admit: 2018-08-03 | Discharge: 2018-08-03 | Disposition: A | Discharge status: Home / Self Care | Payer: Medicare HMO | Attending: Emergency Medicine | Admitting: Emergency Medicine

## 2018-08-03 ENCOUNTER — Other Ambulatory Visit: Payer: Self-pay

## 2018-08-03 DIAGNOSIS — Z8673 Personal history of transient ischemic attack (TIA), and cerebral infarction without residual deficits: Secondary | ICD-10-CM | POA: Insufficient documentation

## 2018-08-03 DIAGNOSIS — Z79899 Other long term (current) drug therapy: Secondary | ICD-10-CM | POA: Insufficient documentation

## 2018-08-03 DIAGNOSIS — I2510 Atherosclerotic heart disease of native coronary artery without angina pectoris: Secondary | ICD-10-CM | POA: Insufficient documentation

## 2018-08-03 DIAGNOSIS — M5136 Other intervertebral disc degeneration, lumbar region: Secondary | ICD-10-CM | POA: Insufficient documentation

## 2018-08-03 DIAGNOSIS — M792 Neuralgia and neuritis, unspecified: Secondary | ICD-10-CM | POA: Diagnosis not present

## 2018-08-03 DIAGNOSIS — I10 Essential (primary) hypertension: Secondary | ICD-10-CM | POA: Insufficient documentation

## 2018-08-03 DIAGNOSIS — M25552 Pain in left hip: Secondary | ICD-10-CM | POA: Diagnosis not present

## 2018-08-03 DIAGNOSIS — R52 Pain, unspecified: Secondary | ICD-10-CM | POA: Diagnosis not present

## 2018-08-03 DIAGNOSIS — Z7982 Long term (current) use of aspirin: Secondary | ICD-10-CM | POA: Insufficient documentation

## 2018-08-03 MED ORDER — LIDOCAINE 5 % EX PTCH
1.0000 | MEDICATED_PATCH | CUTANEOUS | Status: DC
  Administered 2018-08-03: 1 via TRANSDERMAL
  Filled 2018-08-03: qty 1

## 2018-08-03 MED ORDER — LIDOCAINE 5 % EX PTCH: 1.0000 | MEDICATED_PATCH | CUTANEOUS | 0 refills | Status: DC

## 2018-08-03 MED ORDER — METHYLPREDNISOLONE 4 MG PO TBPK: ORAL_TABLET | ORAL | 0 refills | Status: DC

## 2018-08-03 MED ORDER — TRAMADOL HCL 50 MG PO TABS: 50.0000 mg | ORAL_TABLET | Freq: Four times a day (QID) | ORAL | 0 refills | Status: DC | PRN

## 2018-08-03 MED ORDER — ACETAMINOPHEN 500 MG PO TABS
1000.0000 mg | ORAL_TABLET | Freq: Once | ORAL | Status: AC
  Administered 2018-08-03: 1000 mg via ORAL
  Filled 2018-08-03: qty 2

## 2018-08-03 MED ORDER — KETOROLAC TROMETHAMINE 60 MG/2ML IM SOLN
60.0000 mg | Freq: Once | INTRAMUSCULAR | Status: AC
  Administered 2018-08-03: 60 mg via INTRAMUSCULAR
  Filled 2018-08-03: qty 2

## 2018-08-03 MED ORDER — DEXAMETHASONE 4 MG PO TABS
10.0000 mg | ORAL_TABLET | Freq: Once | ORAL | Status: AC
  Administered 2018-08-03: 10 mg via ORAL
  Filled 2018-08-03: qty 2

## 2018-08-03 MED ORDER — OXYCODONE HCL 5 MG PO TABS
5.0000 mg | ORAL_TABLET | Freq: Once | ORAL | Status: AC
  Administered 2018-08-03: 5 mg via ORAL
  Filled 2018-08-03: qty 1

## 2018-08-03 NOTE — ED Provider Notes (Signed)
[facility] DEPT Provider Note   CSN: [id number] Arrival date & time: 08/03/18  2595    History   Chief Complaint Chief Complaint  Patient presents with  . Hip Pain    HPI Sara [name] is a 85 y.o. female.     HPI   Presents with concern for left hip pain. Pain starts in left buttock, stabbing pain, radiating to groin and down medial leg. Also has had burning pain in bilateral legs for about one year but worsening recently.  No weakness.  Was getting better when she balled up on side but last night was not.  Had recent CT abdomen for pain and XR hips which showed arthritis of hip and back.  Taking tylenol 3 from PCP which is not helping. No fevers.   Past Medical History:  Diagnosis Date  . Allergy   . CAD (coronary artery disease)   . Clinical trial participant    [facility] studies in familial dementia  . Dyspnea 11/28/2016  . Frequent UTI   . GERD (gastroesophageal reflux disease)   . Hypertension   . Osteoarthritis    hips, back  . Osteopenia   . Pneumonia   . Spinal stenosis   . Stroke [facility]) 1985    Patient Active Problem List   Diagnosis Date Noted  . Left groin pain 07/29/2018  . Abdominal pain, left lower quadrant 07/20/2018  . Bradycardia 07/01/2018  . Dyspnea 07/01/2018  . Left ear pain 07/01/2018  . Mild headache 01/05/2018  . Rhinorrhea 01/05/2018  . Chronic diastolic heart failure ([place]) 12/27/2017  . Left low back pain 03/11/2017  . Diastolic dysfunction 63/87/5643  . Estrogen deficiency 12/16/2016  . Routine general medical examination at a health care facility 12/16/2016  . CAD (coronary artery disease) 12/04/2016  . Dyspepsia 03/15/2016  . 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 Eczema 03/06/2015  . Hyperglycemia 02/11/2014  . Intertrigo labialis 12/07/2012  . CONSTIPATION, CHRONIC 07/03/2010  . Osteoporosis 06/24/2008  . HYPERCHOLESTEROLEMIA 12/08/2006  . Essential hypertension 12/08/2006  .  GERD 12/08/2006  . H/O herpes zoster 12/08/2006    Past Surgical History:  Procedure Laterality Date  . ABDOMINAL HYSTERECTOMY     partial has one ovary  . APPENDECTOMY    . CATARACT EXTRACTION Bilateral   . LEFT HEART CATH AND CORONARY ANGIOGRAPHY N/A 11/29/2016   Procedure: Left Heart Cath and Coronary Angiography;  Surgeon: Sherren Mocha, MD;  Location: [facility];  Service: Cardiovascular;  Laterality: N/A;  . LUMBAR DISC SURGERY     x 3  . rotator cuff surgery Right 2010     OB History   No obstetric history on file.      Home Medications    Prior to Admission medications   Medication Sig Start Date End Date Taking? Authorizing Provider  acetaminophen-codeine (TYLENOL #3) 300-30 MG tablet Take 1 tablet by mouth every 6 (six) hours as needed for moderate pain or severe pain. 07/30/18   Tower, Wynelle Fanny, MD  aspirin EC 81 MG tablet Take 81 mg by mouth daily.      [provider]  Cholecalciferol (VITAMIN D-3) 1000 UNITS CAPS Take 1,000 Units by mouth daily.     [provider]  diltiazem (CARTIA XT) 120 MG 24 hr capsule Take 1 capsule (120 mg total) by mouth daily. 12/24/17   Tower, Wynelle Fanny, MD  Docusate Sodium (EQ STOOL SOFTENER PO) Take 100 mg by mouth daily as needed (constipation).  [provider]  doxylamine, Sleep, (EQ SLEEP AID) 25 MG tablet Take 25 mg by mouth at bedtime as needed for sleep.     [provider]  hydrochlorothiazide (HYDRODIURIL) 25 MG tablet Take 1 tablet (25 mg total) by mouth daily. 12/24/17   Tower, Wynelle Fanny, MD  lidocaine (LIDODERM) 5 % Place 1 patch onto the skin daily. Remove & Discard patch within 12 hours or as directed by MD 08/03/18   Gareth Morgan, MD  methylPREDNISolone (MEDROL DOSEPAK) 4 MG TBPK tablet See instructions 08/03/18   Gareth Morgan, MD  nitroGLYCERIN (NITROSTAT) 0.4 MG SL tablet Place 1 tablet (0.4 mg total) under the tongue every 5 (five) minutes as needed for chest pain (max 3  doses in 15 minutes). 01/02/18   Tower, Wynelle Fanny, MD  ondansetron (ZOFRAN-ODT) 4 MG disintegrating tablet Take 1 tablet (4 mg total) by mouth every 8 (eight) hours as needed for nausea or vomiting. 01/17/18   Augusto Gamble B, NP  polyethylene glycol (MIRALAX / GLYCOLAX) packet Take 17 g by mouth daily.     [provider]  potassium chloride (K-DUR,KLOR-CON) 10 MEQ tablet Take 1 tablet (10 mEq total) by mouth daily. 12/24/17   Tower, Wynelle Fanny, MD  ranitidine (ZANTAC) 150 MG capsule Take 1 capsule (150 mg total) by mouth 2 (two) times daily. 12/24/17   Tower, Wynelle Fanny, MD  traMADol (ULTRAM) 50 MG tablet Take 1 tablet (50 mg total) by mouth every 6 (six) hours as needed. 08/03/18   Gareth Morgan, MD    Family History Family History  Problem Relation Age of Onset  . Lung cancer Brother   . Other Mother        kidney tumor  . Hypertension Mother   . Diabetes Sister   . Diabetes Brother   . Diabetes Sister        x 2  . Colon cancer Maternal Uncle   . Other Sister        intestine burst  . Breast cancer Maternal Aunt        x 2  . Breast cancer Daughter   . Diabetes Son        x 3  . Diabetes Daughter   . Esophageal cancer Neg Hx   . Rectal cancer Neg Hx   . Stomach cancer Neg Hx     Social History Social History   Tobacco Use  . Smoking status: Never Smoker  . Smokeless tobacco: Never Used  Substance Use Topics  . Alcohol use: No    Alcohol/week: 0.0 standard drinks  . Drug use: No     Allergies   Amoxicillin; Atorvastatin; Cefpodoxime proxetil; Clarithromycin; Fosamax [alendronate sodium]; Furosemide; and Gabapentin   Review of Systems Review of Systems  Constitutional: Negative for fever.  HENT: Negative for sore throat.   Eyes: Negative for visual disturbance.  Respiratory: Negative for cough and shortness of breath.   Cardiovascular: Negative for chest pain.  Gastrointestinal: Negative for abdominal pain, nausea and vomiting.  Genitourinary: Negative for  difficulty urinating.  Musculoskeletal: Positive for back pain. Negative for neck pain.  Skin: Negative for rash.  Neurological: Negative for syncope, weakness, numbness and headaches.     Physical Exam Updated Vital Signs BP 137/81   Pulse (!) 52   Temp 98.2 F (36.8 C) (Oral)   Resp 17   SpO2 97%   Physical Exam Vitals signs and nursing note reviewed.  Constitutional:      General: She is not  in acute distress.    Appearance: She is well-developed. She is not diaphoretic.  HENT:     Head: Normocephalic and atraumatic.  Eyes:     Conjunctiva/sclera: Conjunctivae normal.  Neck:     Musculoskeletal: Normal range of motion.  Cardiovascular:     Rate and Rhythm: Normal rate and regular rhythm.     Heart sounds: Normal heart sounds. No murmur. No friction rub. No gallop.   Pulmonary:     Effort: Pulmonary effort is normal. No respiratory distress.     Breath sounds: Normal breath sounds. No wheezing or rales.  Abdominal:     General: There is no distension.     Palpations: Abdomen is soft.     Tenderness: There is no abdominal tenderness. There is no guarding.  Musculoskeletal:     Lumbar back: She exhibits tenderness.  Skin:    General: Skin is warm and dry.     Findings: No erythema or rash.  Neurological:     Mental Status: She is alert and oriented to person, place, and time.     GCS: GCS eye subscore is 4. GCS verbal subscore is 5. GCS motor subscore is 6.     Sensory: Sensation is intact.     Motor: Motor function is intact.     Gait: Gait is intact.      ED Treatments / Results  Labs (all labs ordered are listed, but only abnormal results are displayed) Labs Reviewed - No data to display  EKG None  Radiology No results found.  Procedures Procedures (including critical care time)  Medications Ordered in ED Medications  ketorolac (TORADOL) injection 60 mg (60 mg Intramuscular Given 08/03/18 0917)  dexamethasone (DECADRON) tablet 10 mg (10 mg Oral  Given 08/03/18 0915)  acetaminophen (TYLENOL) tablet 1,000 mg (1,000 mg Oral Given 08/03/18 0915)  oxyCODONE (Oxy IR/ROXICODONE) immediate release tablet 5 mg (5 mg Oral Given 08/03/18 0915)     Initial Impression / Assessment and Plan / ED Course  I have reviewed the triage vital signs and the nursing notes.  Pertinent labs & imaging results that were available during my care of the patient were reviewed by me and considered in my medical decision making (see chart for details).        85yo female presents with concern for back pain, left hip pain, buttock pain radiating to the groin and medial leg as well as worsening of chronic bilateral lower leg burning.  Had CT abdomen/pelvis and XR hip done as an outpatient that showed degenerative changes of hp and back without other acute intraabdominal pathology. Doubt UTI by location of pain, no urinary symptoms.  No history or physical exam findings to suggest cauda equina, epidural abscess. Suspect pain is related to degenerative changes and also consider separate neuropathy for burning pain in bilateral lower extremities.    Recommend follow up with PCP and spine physician regarding continuing pain.  Evaluated in [facility] drug database. Discussed risks of tramadol and recommend lidocaine, ibuprofen, tylenol primarily for pain. Patient discharged in stable condition with understanding of reasons to return.   Final Clinical Impressions(s) / ED Diagnoses   Final diagnoses:  Degenerative disc disease, lumbar  Pain of left hip joint  Neuropathic pain    ED Discharge Orders         Ordered    lidocaine (LIDODERM) 5 %  Every 24 hours     08/03/18 0934    traMADol (ULTRAM) 50 MG tablet  Every 6  hours PRN     08/03/18 0934    methylPREDNISolone (MEDROL DOSEPAK) 4 MG TBPK tablet     08/03/18 0935           Gareth Morgan, MD 08/03/18 2229

## 2018-08-03 NOTE — ED Notes (Signed)
UA sample at bedside. Pt ambulated to restroom.

## 2018-08-03 NOTE — ED Notes (Signed)
Bed: WA04 Expected date:  Expected time:  Means of arrival:  Comments: ems 

## 2018-08-03 NOTE — ED Triage Notes (Signed)
Pt BIBA from home c/o on going left hip pain, burning in left leg.  Pt ambulatory at home.  Daughter reports last seen at PCP last Wednesday for chronic arthritis in same leg.  Pt c/o not being able to sleep d/t pain.   Pt able to ambulate standby assist to bathroom in ED.

## 2018-08-05 ENCOUNTER — Ambulatory Visit (INDEPENDENT_AMBULATORY_CARE_PROVIDER_SITE_OTHER): Payer: Medicare HMO | Admitting: Family Medicine

## 2018-08-05 ENCOUNTER — Encounter (INDEPENDENT_AMBULATORY_CARE_PROVIDER_SITE_OTHER): Payer: Self-pay | Admitting: Family Medicine

## 2018-08-05 DIAGNOSIS — M25552 Pain in left hip: Secondary | ICD-10-CM | POA: Diagnosis not present

## 2018-08-05 DIAGNOSIS — M545 Low back pain, unspecified: Secondary | ICD-10-CM

## 2018-08-05 MED ORDER — TRAMADOL HCL 50 MG PO TABS: 50.0000 mg | ORAL_TABLET | Freq: Four times a day (QID) | ORAL | 0 refills | Status: DC | PRN

## 2018-08-05 NOTE — Progress Notes (Signed)
Office Visit Note   Patient: Sara [name]           Date of Birth: 28-Dec-1931           MRN: [id number] Visit Date: 08/05/2018 Requested by: Tower, Wynelle Fanny, MD [place], [place] [address] PCP: Abner Greenspan, MD  Subjective: Chief Complaint  Patient presents with  . Left Hip - Pain    Pain in left buttock and left groin x 2-3 weeks. NKI    HPI: She is an 85 year old with left posterior and anterior hip pain.  Symptoms started 2 or 3 months ago, no definite injury.  She attributes her pain to getting a new pair of hearing aids.  She feels that there is something about the hearing aids that almost instantly resulted in severe pain in the left buttocks and shooting down both legs with a burning sensation in her legs.  She has had hearing aids in the past but this was a different type.  She never had this issue with previous ones.  She told the audiologist but they did not think there could be an association.  She has since stopped wearing them.  She went to the ER recently and was given some medications which have helped.  Her pain seems to be much better today.  Both of her daughters are with her today.  She has a history of lumbar fusion per Dr. Louanne Skye many years ago.  She has done very well since then.               ROS: She has a history of GERD, hypertension, coronary artery disease, osteoporosis.  Other systems were reviewed and are negative as pertains to chief complaint.  Objective: Vital Signs: There were no vitals taken for this visit.  Physical Exam:  Low back: Well-healed midline surgical scar, not tender to palpation in the lumbar spine.  Mild tenderness in the left sciatic notch, fairly good range of motion and no significant pain with passive hip flexion and internal/external rotation.  No tenderness over the greater trochanter.  Lower extremity strength and reflexes are normal.  She walks without a significant limp.  Imaging: None today.   Recent x-rays reviewed on computer show moderate left hip DJD, stable surgical fusion in the lumbar spine.  Assessment & Plan: 1.  Resolving left hip pain, etiology uncertain.  Patient feels that it was associated with her hearing aids. -If symptoms recur, could try physical therapy, or possibly refer her for epidural steroid injection or do an intra-articular left hip injection if it seems to be located to the groin area. -I will do some research into hearing aids associated with sciatica type pain, although the association seems to be unlikely. -Refilled tramadol to take as needed.     Procedures: No procedures performed  No notes on file     PMFS History: Patient Active Problem List   Diagnosis Date Noted  . Left groin pain 07/29/2018  . Abdominal pain, left lower quadrant 07/20/2018  . Bradycardia 07/01/2018  . Dyspnea 07/01/2018  . Left ear pain 07/01/2018  . Mild headache 01/05/2018  . Rhinorrhea 01/05/2018  . Chronic diastolic heart failure ([place]) 12/27/2017  . Left low back pain 03/11/2017  . Diastolic dysfunction 83/25/4982  . Estrogen deficiency 12/16/2016  . Routine general medical examination at a health care facility 12/16/2016  . CAD (coronary artery disease) 12/04/2016  . Dyspepsia 03/15/2016  . 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 Eczema  03/06/2015  . Hyperglycemia 02/11/2014  . Intertrigo labialis 12/07/2012  . CONSTIPATION, CHRONIC 07/03/2010  . Osteoporosis 06/24/2008  . HYPERCHOLESTEROLEMIA 12/08/2006  . Essential hypertension 12/08/2006  . GERD 12/08/2006  . H/O herpes zoster 12/08/2006   Past Medical History:  Diagnosis Date  . Allergy   . CAD (coronary artery disease)   . Clinical trial participant    [facility] studies in familial dementia  . Dyspnea 11/28/2016  . Frequent UTI   . GERD (gastroesophageal reflux disease)   . Hypertension   . Osteoarthritis    hips, back  . Osteopenia   . Pneumonia   . Spinal stenosis   . Stroke  [facility]) 1985    Family History  Problem Relation Age of Onset  . Lung cancer Brother   . Other Mother        kidney tumor  . Hypertension Mother   . Diabetes Sister   . Diabetes Brother   . Diabetes Sister        x 2  . Colon cancer Maternal Uncle   . Other Sister        intestine burst  . Breast cancer Maternal Aunt        x 2  . Breast cancer Daughter   . Diabetes Son        x 3  . Diabetes Daughter   . Esophageal cancer Neg Hx   . Rectal cancer Neg Hx   . Stomach cancer Neg Hx     Past Surgical History:  Procedure Laterality Date  . ABDOMINAL HYSTERECTOMY     partial has one ovary  . APPENDECTOMY    . CATARACT EXTRACTION Bilateral   . LEFT HEART CATH AND CORONARY ANGIOGRAPHY N/A 11/29/2016   Procedure: Left Heart Cath and Coronary Angiography;  Surgeon: Sherren Mocha, MD;  Location: [place] [facility];  Service: Cardiovascular;  Laterality: N/A;  . LUMBAR DISC SURGERY     x 3  . rotator cuff surgery Right 2010   Social History   Occupational History  . Occupation: RETIRED    Employer: RETIRED  Tobacco Use  . Smoking status: Never Smoker  . Smokeless tobacco: Never Used  Substance and Sexual Activity  . Alcohol use: No    Alcohol/week: 0.0 standard drinks  . Drug use: No  . Sexual activity: Not on file

## 2018-08-11 ENCOUNTER — Ambulatory Visit (INDEPENDENT_AMBULATORY_CARE_PROVIDER_SITE_OTHER): Payer: Medicare HMO | Admitting: Family Medicine

## 2018-08-11 ENCOUNTER — Encounter: Payer: Self-pay | Admitting: Family Medicine

## 2018-08-11 VITALS — BP 128/78 | HR 56 | Temp 98.0°F | Ht 62.5 in | Wt 149.0 lb

## 2018-08-11 DIAGNOSIS — M545 Low back pain, unspecified: Secondary | ICD-10-CM

## 2018-08-11 DIAGNOSIS — M1612 Unilateral primary osteoarthritis, left hip: Secondary | ICD-10-CM | POA: Diagnosis not present

## 2018-08-11 DIAGNOSIS — G8929 Other chronic pain: Secondary | ICD-10-CM

## 2018-08-11 NOTE — Progress Notes (Signed)
Subjective:    Patient ID: Sara [name], female    DOB: 17-Jun-1931, 85 y.o.   MRN: [id number]  HPI Here for ED f/u from 2/24 for L hip pain  Starting in L buttock/stabbing in nature  Radiates to groin and down medial leg  Also burning pain in both legs for a year  Noted XR of hips showed OA in hip and back and recent CT abd/pelvis nl   Given toradol and decadron and tylenol and oxycodone  Disc likelyhood that pain is from degenerative changes and that burning may be neuropathy or radiculopathy   D/c with tramadol (caution)  lidoderm pred dose pack   Pt thought her new hearing aides caused her pain some how   Wt Readings from Last 3 Encounters:  08/11/18 149 lb (67.6 kg)  07/01/18 151 lb 8 oz (68.7 kg)  01/05/18 149 lb (67.6 kg)   26.82 kg/m   She then saw Dr Junius Roads orthopedics on 2/26 Was improved at that time Noted consideration of PT and epidural steroid hip injection if needed later  Refilled tramadol   She still hurts some but better than it was   C/o of dizziness today   No energy and low motivation  May be a little down    Patient Active Problem List   Diagnosis Date Noted  . Arthritis of left hip 07/29/2018  . Abdominal pain, left lower quadrant 07/20/2018  . Bradycardia 07/01/2018  . Dyspnea 07/01/2018  . Mild headache 01/05/2018  . Rhinorrhea 01/05/2018  . Chronic diastolic heart failure ([place]) 12/27/2017  . Left low back pain 03/11/2017  . Diastolic dysfunction 30/16/0109  . Estrogen deficiency 12/16/2016  . Routine general medical examination at a health care facility 12/16/2016  . CAD (coronary artery disease) 12/04/2016  . Dyspepsia 03/15/2016  . 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 Eczema 03/06/2015  . Hyperglycemia 02/11/2014  . Intertrigo labialis 12/07/2012  . CONSTIPATION, CHRONIC 07/03/2010  . Osteoporosis 06/24/2008  . HYPERCHOLESTEROLEMIA 12/08/2006  . Essential hypertension 12/08/2006  . GERD 12/08/2006  . H/O herpes zoster  12/08/2006   Past Medical History:  Diagnosis Date  . Allergy   . CAD (coronary artery disease)   . Clinical trial participant    [facility] studies in familial dementia  . Dyspnea 11/28/2016  . Frequent UTI   . GERD (gastroesophageal reflux disease)   . Hypertension   . Osteoarthritis    hips, back  . Osteopenia   . Pneumonia   . Spinal stenosis   . Stroke [facility]) 1985   Past Surgical History:  Procedure Laterality Date  . ABDOMINAL HYSTERECTOMY     partial has one ovary  . APPENDECTOMY    . CATARACT EXTRACTION Bilateral   . LEFT HEART CATH AND CORONARY ANGIOGRAPHY N/A 11/29/2016   Procedure: Left Heart Cath and Coronary Angiography;  Surgeon: Sherren Mocha, MD;  Location: [place] [facility];  Service: Cardiovascular;  Laterality: N/A;  . LUMBAR DISC SURGERY     x 3  . rotator cuff surgery Right 2010   Social History   Tobacco Use  . Smoking status: Never Smoker  . Smokeless tobacco: Never Used  Substance Use Topics  . Alcohol use: No    Alcohol/week: 0.0 standard drinks  . Drug use: No   Family History  Problem Relation Age of Onset  . Lung cancer Brother   . Other Mother        kidney tumor  . Hypertension Mother   .  Diabetes Sister   . Diabetes Brother   . Diabetes Sister        x 2  . Colon cancer Maternal Uncle   . Other Sister        intestine burst  . Breast cancer Maternal Aunt        x 2  . Breast cancer Daughter   . Diabetes Son        x 3  . Diabetes Daughter   . Esophageal cancer Neg Hx   . Rectal cancer Neg Hx   . Stomach cancer Neg Hx    Allergies  Allergen Reactions  . Amoxicillin     Reaction not known  . Atorvastatin     aching  . Cefpodoxime Proxetil     Reaction not known  . Clarithromycin     Reaction not known  . Fosamax [Alendronate Sodium]     Body pain   . Furosemide     REACTION: doesn't help  . Gabapentin Swelling    Caused severe swellling   Current Outpatient Medications on File Prior to Visit    Medication Sig Dispense Refill  . acetaminophen-codeine (TYLENOL #3) 300-30 MG tablet Take 1 tablet by mouth every 6 (six) hours as needed for moderate pain or severe pain. 30 tablet 0  . aspirin EC 81 MG tablet Take 81 mg by mouth daily.      . Cholecalciferol (VITAMIN D-3) 1000 UNITS CAPS Take 1,000 Units by mouth daily.     Marland Kitchen diltiazem (CARTIA XT) 120 MG 24 hr capsule Take 1 capsule (120 mg total) by mouth daily. 90 capsule 3  . Docusate Sodium (EQ STOOL SOFTENER PO) Take 100 mg by mouth daily as needed (constipation).     Marland Kitchen doxylamine, Sleep, (EQ SLEEP AID) 25 MG tablet Take 25 mg by mouth at bedtime as needed for sleep.     . hydrochlorothiazide (HYDRODIURIL) 25 MG tablet Take 1 tablet (25 mg total) by mouth daily. 90 tablet 3  . nitroGLYCERIN (NITROSTAT) 0.4 MG SL tablet Place 1 tablet (0.4 mg total) under the tongue every 5 (five) minutes as needed for chest pain (max 3 doses in 15 minutes). 25 tablet 3  . ondansetron (ZOFRAN-ODT) 4 MG disintegrating tablet Take 1 tablet (4 mg total) by mouth every 8 (eight) hours as needed for nausea or vomiting. 12 tablet 0  . polyethylene glycol (MIRALAX / GLYCOLAX) packet Take 17 g by mouth daily.     . potassium chloride (K-DUR,KLOR-CON) 10 MEQ tablet Take 1 tablet (10 mEq total) by mouth daily. 90 tablet 3  . ranitidine (ZANTAC) 150 MG capsule Take 1 capsule (150 mg total) by mouth 2 (two) times daily. 180 capsule 3  . traMADol (ULTRAM) 50 MG tablet Take 1 tablet (50 mg total) by mouth every 6 (six) hours as needed. 30 tablet 0   No current facility-administered medications on file prior to visit.     Review of Systems  Constitutional: Negative for activity change, appetite change, fatigue, fever and unexpected weight change.  HENT: Negative for congestion, ear pain, rhinorrhea, sinus pressure and sore throat.   Eyes: Negative for pain, redness and visual disturbance.  Respiratory: Negative for cough, shortness of breath and wheezing.    Cardiovascular: Negative for chest pain and palpitations.  Gastrointestinal: Negative for abdominal pain, blood in stool, constipation and diarrhea.  Endocrine: Negative for polydipsia and polyuria.  Genitourinary: Negative for dysuria, frequency and urgency.  Musculoskeletal: Positive for arthralgias and back pain. Negative  for joint swelling and myalgias.  Skin: Negative for pallor and rash.  Allergic/Immunologic: Negative for environmental allergies.  Neurological: Positive for dizziness. Negative for syncope and headaches.       Had some dizziness This is better today  Hematological: Negative for adenopathy. Does not bruise/bleed easily.  Psychiatric/Behavioral: Negative for decreased concentration and dysphoric mood. The patient is not nervous/anxious.        Objective:   Physical Exam Constitutional:      General: She is not in acute distress.    Appearance: Normal appearance. She is normal weight. She is not ill-appearing.  HENT:     Head: Normocephalic and atraumatic.     Mouth/Throat:     Mouth: Mucous membranes are moist.     Pharynx: Oropharynx is clear.  Eyes:     General: No scleral icterus.    Conjunctiva/sclera: Conjunctivae normal.     Pupils: Pupils are equal, round, and reactive to light.     Comments: No nystagmus   Neck:     Musculoskeletal: Normal range of motion. No muscular tenderness.     Vascular: No carotid bruit.  Cardiovascular:     Rate and Rhythm: Regular rhythm. Bradycardia present.     Pulses: Normal pulses.     Heart sounds: Normal heart sounds.  Pulmonary:     Effort: Pulmonary effort is normal. No respiratory distress.     Breath sounds: Normal breath sounds. No wheezing or rales.  Abdominal:     General: Abdomen is flat. Bowel sounds are normal. There is no distension.     Palpations: There is no mass.     Tenderness: There is no abdominal tenderness.  Musculoskeletal:     Right lower leg: No edema.     Left lower leg: No edema.      Comments: Mild LS tenderness Mild buttock pain on SLR   Nl rom of hips-some discomfort on full ext rotation No trochanteric tenderness   Lymphadenopathy:     Cervical: No cervical adenopathy.  Skin:    General: Skin is warm and dry.     Findings: No rash.  Neurological:     Mental Status: She is alert. Mental status is at baseline.     Motor: No weakness.     Coordination: Coordination normal.     Gait: Gait normal.     Deep Tendon Reflexes: Reflexes normal.     Comments: Neg rhomberg   Psychiatric:        Mood and Affect: Mood normal.           Assessment & Plan:   Problem List Items Addressed This Visit      Musculoskeletal and Integument   Arthritis of left hip - Primary    Overall pain is improved after tx in ED (pain med and steroid) and also ortho visit with Dr Junius Roads She still thinks her hearing aides may have caused that pain because the timing lines up (I doubt this)  She has tramadol to use prn (caution of sedation and diziness)- she is no longer dizzy today  She can f/u with ortho if symptoms worsen again (per note would consider hip injection plus/minus PT)        Other   Left low back pain    This may have caused some her her L groin and leg pain along with her hip OA  Rev ED note (Reviewed hospital records, lab results and studies in detail ) as well as hip OA  I do not think hearing aides are a cause She will f/u with ortho prn

## 2018-08-11 NOTE — Assessment & Plan Note (Signed)
This may have caused some her her L groin and leg pain along with her hip OA  Rev ED note (Reviewed hospital records, lab results and studies in detail ) as well as hip OA  I do not think hearing aides are a cause She will f/u with ortho prn

## 2018-08-11 NOTE — Assessment & Plan Note (Signed)
Overall pain is improved after tx in ED (pain med and steroid) and also ortho visit with Dr Junius Roads She still thinks her hearing aides may have caused that pain because the timing lines up (I doubt this)  She has tramadol to use prn (caution of sedation and diziness)- she is no longer dizzy today  She can f/u with ortho if symptoms worsen again (per note would consider hip injection plus/minus PT)

## 2018-08-11 NOTE — Patient Instructions (Addendum)
Once you feel physically able - slowly start walking again  I think your motivation and energy will improvement   Be cautious of the tramadol- it can cause some dizziness  Keep [place] posted   If pain worsens - call Dr [facility] office and let them know (he noted a plan for possible injection and /or physical therapy)    Make sure to drink enough fluids as well

## 2018-08-17 ENCOUNTER — Ambulatory Visit (INDEPENDENT_AMBULATORY_CARE_PROVIDER_SITE_OTHER): Payer: Medicare HMO | Admitting: Family Medicine

## 2018-08-17 ENCOUNTER — Encounter (INDEPENDENT_AMBULATORY_CARE_PROVIDER_SITE_OTHER): Payer: Self-pay | Admitting: Family Medicine

## 2018-08-17 DIAGNOSIS — M25552 Pain in left hip: Secondary | ICD-10-CM

## 2018-08-17 DIAGNOSIS — M545 Low back pain, unspecified: Secondary | ICD-10-CM

## 2018-08-17 MED ORDER — METHYLPREDNISOLONE 4 MG PO TBPK: ORAL_TABLET | ORAL | 0 refills | Status: DC

## 2018-08-17 NOTE — Progress Notes (Signed)
Office Visit Note   Patient: Sara [name]           Date of Birth: 1932-05-31           MRN: [id number] Visit Date: 08/17/2018 Requested by: Tower, Wynelle Fanny, MD [place], [place] [address] PCP: Abner Greenspan, MD  Subjective: Chief Complaint  Patient presents with  . Left Hip - Pain    Continues to have left hip pain - lateral and around to anterior knee. Taking 2 Tramadol q 6 hours - "1 does no good."     HPI: She is here with flareup of left hip pain.  Over the weekend her pain got worse, posterior hip into the groin area and down toward the knee.  It hurts to sit or stand for any length of time.  Pain is better when she lies down.  No bowel or bladder dysfunction.  Not taking any medications.              ROS: Noncontributory  Objective: Vital Signs: There were no vitals taken for this visit.  Physical Exam:  Left hip: No significant lumbar spine tenderness, no pain over the SI joint.  Mild tenderness in the left sciatic notch.  Mild tenderness on the greater trochanter.  Slightly decreased internal hip rotation motion but no significant pain with passive motion.  Lower extremity strength and reflexes are normal.  Imaging: None today.  Assessment & Plan: 1.  Flareup left hip pain, etiology uncertain.  She has DJD on x-ray but exam does not seem to suggest pain from hip DJD.  Question lumbar disc protrusion. -Medrol Dosepak, trial of physical therapy.  If pain persists consider a one-time epidural steroid injection or possibly a diagnostic/therapeutic hip injection.     Procedures: No procedures performed  No notes on file     PMFS History: Patient Active Problem List   Diagnosis Date Noted  . Arthritis of left hip 07/29/2018  . Abdominal pain, left lower quadrant 07/20/2018  . Bradycardia 07/01/2018  . Dyspnea 07/01/2018  . Mild headache 01/05/2018  . Rhinorrhea 01/05/2018  . Chronic diastolic heart failure ([place]) 12/27/2017  . Left  low back pain 03/11/2017  . Diastolic dysfunction 62/13/0865  . Estrogen deficiency 12/16/2016  . Routine general medical examination at a health care facility 12/16/2016  . CAD (coronary artery disease) 12/04/2016  . Dyspepsia 03/15/2016  . 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 Eczema 03/06/2015  . Hyperglycemia 02/11/2014  . Intertrigo labialis 12/07/2012  . CONSTIPATION, CHRONIC 07/03/2010  . Osteoporosis 06/24/2008  . HYPERCHOLESTEROLEMIA 12/08/2006  . Essential hypertension 12/08/2006  . GERD 12/08/2006  . H/O herpes zoster 12/08/2006   Past Medical History:  Diagnosis Date  . Allergy   . CAD (coronary artery disease)   . Clinical trial participant    [facility] studies in familial dementia  . Dyspnea 11/28/2016  . Frequent UTI   . GERD (gastroesophageal reflux disease)   . Hypertension   . Osteoarthritis    hips, back  . Osteopenia   . Pneumonia   . Spinal stenosis   . Stroke [facility]) 1985    Family History  Problem Relation Age of Onset  . Lung cancer Brother   . Other Mother        kidney tumor  . Hypertension Mother   . Diabetes Sister   . Diabetes Brother   . Diabetes Sister        x 2  .  Colon cancer Maternal Uncle   . Other Sister        intestine burst  . Breast cancer Maternal Aunt        x 2  . Breast cancer Daughter   . Diabetes Son        x 3  . Diabetes Daughter   . Esophageal cancer Neg Hx   . Rectal cancer Neg Hx   . Stomach cancer Neg Hx     Past Surgical History:  Procedure Laterality Date  . ABDOMINAL HYSTERECTOMY     partial has one ovary  . APPENDECTOMY    . CATARACT EXTRACTION Bilateral   . LEFT HEART CATH AND CORONARY ANGIOGRAPHY N/A 11/29/2016   Procedure: Left Heart Cath and Coronary Angiography;  Surgeon: Sherren Mocha, MD;  Location: [place] [facility];  Service: Cardiovascular;  Laterality: N/A;  . LUMBAR DISC SURGERY     x 3  . rotator cuff surgery Right 2010   Social History   Occupational History  .  Occupation: RETIRED    Employer: RETIRED  Tobacco Use  . Smoking status: Never Smoker  . Smokeless tobacco: Never Used  Substance and Sexual Activity  . Alcohol use: No    Alcohol/week: 0.0 standard drinks  . Drug use: No  . Sexual activity: Not on file

## 2018-08-24 ENCOUNTER — Telehealth: Payer: Self-pay | Admitting: Family Medicine

## 2018-08-24 DIAGNOSIS — G8929 Other chronic pain: Secondary | ICD-10-CM

## 2018-08-24 DIAGNOSIS — M545 Low back pain: Secondary | ICD-10-CM

## 2018-08-24 DIAGNOSIS — M1612 Unilateral primary osteoarthritis, left hip: Secondary | ICD-10-CM

## 2018-08-24 NOTE — Telephone Encounter (Signed)
I assume she means Dr Junius Roads (ortho) ?   Would she prefer [place] or [facility] for PT ?

## 2018-08-24 NOTE — Telephone Encounter (Signed)
Pt want a referral to PT for pain on her side per nerve doctor. Please advise pt

## 2018-08-25 NOTE — Telephone Encounter (Signed)
Pt said Dr. Junius Roads does want her to have PT, she would like to go somewhere in [place] she said she's been to a place near [address] In [place] if possible she would like to go back there, please put referral in and I advised pt our [facility] will call to schedule appt

## 2018-08-25 NOTE — Telephone Encounter (Signed)
Sending ref to [facility]

## 2018-08-26 DIAGNOSIS — M6281 Muscle weakness (generalized): Secondary | ICD-10-CM | POA: Diagnosis not present

## 2018-08-26 DIAGNOSIS — M545 Low back pain: Secondary | ICD-10-CM | POA: Diagnosis not present

## 2018-08-26 DIAGNOSIS — M2560 Stiffness of unspecified joint, not elsewhere classified: Secondary | ICD-10-CM | POA: Diagnosis not present

## 2018-08-26 DIAGNOSIS — R262 Difficulty in walking, not elsewhere classified: Secondary | ICD-10-CM | POA: Diagnosis not present

## 2018-08-27 ENCOUNTER — Encounter: Payer: Self-pay | Admitting: Cardiovascular Disease

## 2018-08-28 DIAGNOSIS — M545 Low back pain: Secondary | ICD-10-CM | POA: Diagnosis not present

## 2018-08-28 DIAGNOSIS — M6281 Muscle weakness (generalized): Secondary | ICD-10-CM | POA: Diagnosis not present

## 2018-08-28 DIAGNOSIS — M2560 Stiffness of unspecified joint, not elsewhere classified: Secondary | ICD-10-CM | POA: Diagnosis not present

## 2018-08-28 DIAGNOSIS — R262 Difficulty in walking, not elsewhere classified: Secondary | ICD-10-CM | POA: Diagnosis not present

## 2018-08-29 ENCOUNTER — Emergency Department (HOSPITAL_COMMUNITY): Payer: Medicare HMO

## 2018-08-29 ENCOUNTER — Emergency Department (HOSPITAL_COMMUNITY)
Admission: EM | Admit: 2018-08-29 | Discharge: 2018-08-29 | Disposition: A | Discharge status: Home / Self Care | Payer: Medicare HMO | Attending: Emergency Medicine | Admitting: Emergency Medicine

## 2018-08-29 ENCOUNTER — Other Ambulatory Visit: Payer: Self-pay

## 2018-08-29 ENCOUNTER — Encounter (HOSPITAL_COMMUNITY): Payer: Self-pay

## 2018-08-29 DIAGNOSIS — I5032 Chronic diastolic (congestive) heart failure: Secondary | ICD-10-CM | POA: Insufficient documentation

## 2018-08-29 DIAGNOSIS — I10 Essential (primary) hypertension: Secondary | ICD-10-CM | POA: Diagnosis not present

## 2018-08-29 DIAGNOSIS — G8929 Other chronic pain: Secondary | ICD-10-CM

## 2018-08-29 DIAGNOSIS — Z79899 Other long term (current) drug therapy: Secondary | ICD-10-CM | POA: Insufficient documentation

## 2018-08-29 DIAGNOSIS — M25562 Pain in left knee: Secondary | ICD-10-CM | POA: Diagnosis not present

## 2018-08-29 DIAGNOSIS — I2510 Atherosclerotic heart disease of native coronary artery without angina pectoris: Secondary | ICD-10-CM | POA: Insufficient documentation

## 2018-08-29 DIAGNOSIS — M545 Low back pain: Secondary | ICD-10-CM | POA: Insufficient documentation

## 2018-08-29 DIAGNOSIS — I110 Hypertensive heart disease with heart failure: Secondary | ICD-10-CM | POA: Insufficient documentation

## 2018-08-29 DIAGNOSIS — R1032 Left lower quadrant pain: Secondary | ICD-10-CM | POA: Insufficient documentation

## 2018-08-29 DIAGNOSIS — M25552 Pain in left hip: Secondary | ICD-10-CM | POA: Diagnosis not present

## 2018-08-29 DIAGNOSIS — Z7982 Long term (current) use of aspirin: Secondary | ICD-10-CM | POA: Diagnosis not present

## 2018-08-29 MED ORDER — DIAZEPAM 5 MG PO TABS: 5.0000 mg | ORAL_TABLET | Freq: Two times a day (BID) | ORAL | 0 refills | Status: DC | PRN

## 2018-08-29 MED ORDER — DIAZEPAM 2 MG PO TABS
2.0000 mg | ORAL_TABLET | Freq: Once | ORAL | Status: AC
  Administered 2018-08-29: 2 mg via ORAL
  Filled 2018-08-29: qty 1

## 2018-08-29 MED ORDER — HYDROCODONE-ACETAMINOPHEN 5-325 MG PO TABS
1.0000 | ORAL_TABLET | Freq: Once | ORAL | Status: AC
  Administered 2018-08-29: 1 via ORAL
  Filled 2018-08-29: qty 1

## 2018-08-29 MED ORDER — HYDROCODONE-ACETAMINOPHEN 5-325 MG PO TABS: 1.0000 | ORAL_TABLET | ORAL | 0 refills | Status: DC | PRN

## 2018-08-29 NOTE — ED Triage Notes (Addendum)
Per EMS, patient coming from home with complaints of left hip/groin/leg pain for the past month. Patient has been seen for issue here on 2/24 and does have a history of arthritis. Patient states that her hip/groin area aches, but she has a shooting pain down the leg. Nothing improves the pain, but it is worse after eating. Patient is ambulatory at home.

## 2018-08-29 NOTE — Discharge Instructions (Signed)
Take Norco for severe pain Take Valium for muscle spasms. Do not take these medicines at the same time because they can be sedating Please follow up with Dr. Lemar Livings

## 2018-08-29 NOTE — ED Notes (Signed)
Bed: NX83 Expected date:  Expected time:  Means of arrival:  Comments: 85 yo hip, groin and leg pain

## 2018-08-29 NOTE — ED Notes (Signed)
Patient transported to x-ray. ?

## 2018-08-29 NOTE — ED Provider Notes (Signed)
  Patient Vitals for the past 24 hrs:  BP Temp Temp src Pulse Resp Height Weight  08/29/18 1927 (!) 152/93 98.2 F (36.8 C) Oral 80 16 5\' 2"  (1.575 m) 67.6 kg    Face-to-face evaluation   History: She presents for evaluation of left groin pain which radiates to the left knee, present for several months, currently under treatment with physical therapy, she began this week.  She has not had any improvement, yet.  She is currently taking Tylenol 3, and Motrin for the pain.  She was treated with a Medrol Dosepak about 10 days ago.  She is currently seeing an orthopedic provider who is managing her care.  He plans on doing a hip injection to see if it will help, and is considering an evaluation for lumbar radiculopathy.  Physical exam: Alert elderly female who is calm and comfortable.  Normal active and passive range of motion both legs, bilaterally.  She is able to sit up without significant back pain.  MDM-nonspecific left groin pain, likely lumbar radiculopathy.  Imaging review, CT abdomen pelvis, done, February 2019 indicates degenerative changes, at L2-3, L3-4 and spondylosis, with prior surgical repair.  This is likely source for her pain syndrome.  Medical screening examination/treatment/procedure(s) were conducted as a shared visit with non-physician practitioner(s) and myself.  I personally evaluated the patient during the encounter    Daleen Bo, MD 08/30/18 1113

## 2018-08-29 NOTE — ED Provider Notes (Signed)
[facility] DEPT Provider Note   CSN: [id number] Arrival date & time: 08/29/18  8527    History   Chief Complaint Chief Complaint  Patient presents with  . Hip Pain    (left)    HPI Sara [name] is a 85 y.o. female who presents with L hip pain. PMH significant for hx of CVA, spinal stenosis, osteopenia, arthritis in hips and back s/p lumbar fusion, GERD, HTN, CAD. She states she's had this pain in her L hip for about one month. Eating, movement, walking make it worse. Pain is thought to be due to sciatica but pt doesn't know why eating makes it worse. Passing gas will temporarily make it better. The pain is over the L pelvic area, L groin, and radiates to her L knee. Pain is a twisting/pulling sensation. She has tried OTC meds, Tramadol, steroids without relief. Has seen ortho and they thought maybe more sciatica. She went to PT and the first session helped but second session made it worse. She denies any falls or acute injury. Had CT of abdomen/pelvis on 2/24 which was negative but did show DDD of lumbar spine. Was treated for UTI which didn't help. She came tonight because pain was too severe.    HPI  Past Medical History:  Diagnosis Date  . Allergy   . CAD (coronary artery disease)   . Clinical trial participant    [facility] studies in familial dementia  . Dyspnea 11/28/2016  . Frequent UTI   . GERD (gastroesophageal reflux disease)   . Hypertension   . Osteoarthritis    hips, back  . Osteopenia   . Pneumonia   . Spinal stenosis   . Stroke [facility]) 1985    Patient Active Problem List   Diagnosis Date Noted  . Arthritis of left hip 07/29/2018  . Abdominal pain, left lower quadrant 07/20/2018  . Bradycardia 07/01/2018  . Dyspnea 07/01/2018  . Mild headache 01/05/2018  . Rhinorrhea 01/05/2018  . Chronic diastolic heart failure ([place]) 12/27/2017  . Left low back pain 03/11/2017  . Diastolic dysfunction 78/24/2353  .  Estrogen deficiency 12/16/2016  . Routine general medical examination at a health care facility 12/16/2016  . CAD (coronary artery disease) 12/04/2016  . Dyspepsia 03/15/2016  . 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 Eczema 03/06/2015  . Hyperglycemia 02/11/2014  . Intertrigo labialis 12/07/2012  . CONSTIPATION, CHRONIC 07/03/2010  . Osteoporosis 06/24/2008  . HYPERCHOLESTEROLEMIA 12/08/2006  . Essential hypertension 12/08/2006  . GERD 12/08/2006  . H/O herpes zoster 12/08/2006    Past Surgical History:  Procedure Laterality Date  . ABDOMINAL HYSTERECTOMY     partial has one ovary  . APPENDECTOMY    . CATARACT EXTRACTION Bilateral   . LEFT HEART CATH AND CORONARY ANGIOGRAPHY N/A 11/29/2016   Procedure: Left Heart Cath and Coronary Angiography;  Surgeon: Sherren Mocha, MD;  Location: [facility] CV LAB;  Service: Cardiovascular;  Laterality: N/A;  . LUMBAR DISC SURGERY     x 3  . rotator cuff surgery Right 2010     OB History   No obstetric history on file.      Home Medications    Prior to Admission medications   Medication Sig Start Date End Date Taking? Authorizing Provider  acetaminophen-codeine (TYLENOL #3) 300-30 MG tablet Take 1 tablet by mouth every 6 (six) hours as needed for moderate pain or severe pain. Patient not taking: Reported on 08/17/2018 07/30/18   Abner Greenspan, MD  aspirin EC 81 MG tablet Take 81 mg by mouth daily.      [provider]  Cholecalciferol (VITAMIN D-3) 1000 UNITS CAPS Take 1,000 Units by mouth daily.     [provider]  diltiazem (CARTIA XT) 120 MG 24 hr capsule Take 1 capsule (120 mg total) by mouth daily. 12/24/17   Tower, Wynelle Fanny, MD  Docusate Sodium (EQ STOOL SOFTENER PO) Take 100 mg by mouth daily as needed (constipation).     [provider]  doxylamine, Sleep, (EQ SLEEP AID) 25 MG tablet Take 25 mg by mouth at bedtime as needed for sleep.     [provider]  hydrochlorothiazide (HYDRODIURIL) 25 MG  tablet Take 1 tablet (25 mg total) by mouth daily. 12/24/17   Tower, Wynelle Fanny, MD  methylPREDNISolone (MEDROL DOSEPAK) 4 MG TBPK tablet As directed for 6 days. 08/17/18   Hilts, Legrand Como, MD  nitroGLYCERIN (NITROSTAT) 0.4 MG SL tablet Place 1 tablet (0.4 mg total) under the tongue every 5 (five) minutes as needed for chest pain (max 3 doses in 15 minutes). 01/02/18   Tower, Wynelle Fanny, MD  ondansetron (ZOFRAN-ODT) 4 MG disintegrating tablet Take 1 tablet (4 mg total) by mouth every 8 (eight) hours as needed for nausea or vomiting. 01/17/18   Augusto Gamble B, NP  polyethylene glycol (MIRALAX / GLYCOLAX) packet Take 17 g by mouth daily.     [provider]  potassium chloride (K-DUR,KLOR-CON) 10 MEQ tablet Take 1 tablet (10 mEq total) by mouth daily. 12/24/17   Tower, Wynelle Fanny, MD  ranitidine (ZANTAC) 150 MG capsule Take 1 capsule (150 mg total) by mouth 2 (two) times daily. 12/24/17   Tower, Wynelle Fanny, MD  traMADol (ULTRAM) 50 MG tablet Take 1 tablet (50 mg total) by mouth every 6 (six) hours as needed. 08/05/18   Hilts, Legrand Como, MD    Family History Family History  Problem Relation Age of Onset  . Lung cancer Brother   . Other Mother        kidney tumor  . Hypertension Mother   . Diabetes Sister   . Diabetes Brother   . Diabetes Sister        x 2  . Colon cancer Maternal Uncle   . Other Sister        intestine burst  . Breast cancer Maternal Aunt        x 2  . Breast cancer Daughter   . Diabetes Son        x 3  . Diabetes Daughter   . Esophageal cancer Neg Hx   . Rectal cancer Neg Hx   . Stomach cancer Neg Hx     Social History Social History   Tobacco Use  . Smoking status: Never Smoker  . Smokeless tobacco: Never Used  Substance Use Topics  . Alcohol use: No    Alcohol/week: 0.0 standard drinks  . Drug use: No     Allergies   Amoxicillin; Atorvastatin; Cefpodoxime proxetil; Clarithromycin; Fosamax [alendronate sodium]; Furosemide; and Gabapentin   Review of Systems  Review of Systems  Constitutional: Positive for appetite change. Negative for fever.  Respiratory: Negative for shortness of breath.   Cardiovascular: Negative for chest pain.  Gastrointestinal: Positive for abdominal distention and abdominal pain. Negative for constipation, diarrhea, nausea and vomiting.  Musculoskeletal: Positive for arthralgias, back pain and myalgias. Negative for joint swelling.  Neurological: Negative for weakness and numbness.  All other systems reviewed and are negative.  Physical Exam Updated Vital Signs There were no vitals taken for this visit.  Physical Exam Vitals signs and nursing note reviewed.  Constitutional:      General: She is not in acute distress.    Appearance: Normal appearance. She is well-developed. She is not ill-appearing.     Comments: Calm and cooperative. Well appearing  HENT:     Head: Normocephalic and atraumatic.  Eyes:     General: No scleral icterus.       Right eye: No discharge.        Left eye: No discharge.     Conjunctiva/sclera: Conjunctivae normal.     Pupils: Pupils are equal, round, and reactive to light.  Neck:     Musculoskeletal: Normal range of motion.  Cardiovascular:     Rate and Rhythm: Normal rate and regular rhythm.  Pulmonary:     Effort: Pulmonary effort is normal. No respiratory distress.     Breath sounds: Normal breath sounds.  Abdominal:     General: There is no distension.     Palpations: Abdomen is soft.     Tenderness: There is abdominal tenderness (very mild LLQ tenderness).  Musculoskeletal:     Comments: L hip: FROM. Mild tenderness over anterior groin. Pain worsened with ROM and radiates to knee per patient  L knee: No tenderness. FROM. Patella is pulled laterally with full flexion.  Skin:    General: Skin is warm and dry.  Neurological:     Mental Status: She is alert and oriented to person, place, and time.  Psychiatric:        Behavior: Behavior normal.      ED Treatments /  Results  Labs (all labs ordered are listed, but only abnormal results are displayed) Labs Reviewed - No data to display  EKG None  Radiology Dg Knee Complete 4 Views Left  Result Date: 08/29/2018 CLINICAL DATA:  Left lower extremity pain for the last month. EXAM: LEFT KNEE - COMPLETE 4+ VIEW COMPARISON:  None. FINDINGS: No evidence of fracture, dislocation, or joint effusion. Mild 3 compartment osteoarthritic changes. Soft tissues are unremarkable. IMPRESSION: 1. No acute fracture or dislocation identified about the left knee. 2. Mild 3 compartment osteoarthritic changes. Electronically Signed   By: Fidela Salisbury M.D.   On: 08/29/2018 21:04    Procedures Procedures (including critical care time)  Medications Ordered in ED Medications  HYDROcodone-acetaminophen (NORCO/VICODIN) 5-325 MG per tablet 1 tablet (1 tablet Oral Given 08/29/18 2011)  diazepam (VALIUM) tablet 2 mg (2 mg Oral Given 08/29/18 2048)     Initial Impression / Assessment and Plan / ED Course  I have reviewed the triage vital signs and the nursing notes.  Pertinent labs & imaging results that were available during my care of the patient were reviewed by me and considered in my medical decision making (see chart for details).  85 year old female presents with L hip pain which radiates to her abdomen and knee. She is hypertensive but otherwise vitals are normal. She is tender mostly in the L groin but also in the LLQ, L thigh. She doesn't have any back or knee tenderness but does have pain with ROM of the hip and knee. She's already had a work up by her doctor and ortho. Her main issue is pain control here. Will add xray of knee but explained to her that since her symptoms have been going on a month now, the work up is best done by her doctors as an  outpatient. She was given Norco and Valium here and felt better. Shared visit with Dr. Eulis Foster. Xray of knee shows degenerative changes. Advised close f/u with her doctor. She  was given rx for Norco and Valium.  Final Clinical Impressions(s) / ED Diagnoses   Final diagnoses:  Left hip pain  Chronic midline low back pain, unspecified whether sciatica present  Acute pain of left knee    ED Discharge Orders    None       Recardo Evangelist, PA-C 08/29/18 2306    Daleen Bo, MD 08/30/18 1113

## 2018-08-31 ENCOUNTER — Ambulatory Visit (INDEPENDENT_AMBULATORY_CARE_PROVIDER_SITE_OTHER): Payer: Medicare HMO | Admitting: Family Medicine

## 2018-08-31 ENCOUNTER — Ambulatory Visit (INDEPENDENT_AMBULATORY_CARE_PROVIDER_SITE_OTHER): Payer: Self-pay

## 2018-08-31 ENCOUNTER — Other Ambulatory Visit: Payer: Self-pay

## 2018-08-31 ENCOUNTER — Encounter (INDEPENDENT_AMBULATORY_CARE_PROVIDER_SITE_OTHER): Payer: Self-pay | Admitting: Family Medicine

## 2018-08-31 DIAGNOSIS — M5416 Radiculopathy, lumbar region: Secondary | ICD-10-CM

## 2018-08-31 DIAGNOSIS — M545 Low back pain, unspecified: Secondary | ICD-10-CM

## 2018-08-31 MED ORDER — METHYLPREDNISOLONE ACETATE 80 MG/ML IJ SUSP
40.0000 mg | Freq: Once | INTRAMUSCULAR | Status: AC
  Administered 2018-08-31: 40 mg

## 2018-08-31 MED ORDER — DIAZEPAM 5 MG PO TABS: 5.0000 mg | ORAL_TABLET | Freq: Two times a day (BID) | ORAL | 0 refills | Status: DC | PRN

## 2018-08-31 NOTE — Progress Notes (Signed)
Office Visit Note   Patient: Sara [name]           Date of Birth: 1932-05-23           MRN: [id number] Visit Date: 08/31/2018 Requested by: Tower, Wynelle Fanny, MD [place], [place] [address] PCP: Abner Greenspan, MD  Subjective: Chief Complaint  Patient presents with  . Lower Back - Pain    Pain mainly on the left side of lower back. Norco & muscle relaxer are helping.    HPI: She is here with persistent left posterior lateral hip pain radiating down the front of her left thigh to the knee.  Symptoms are worse when standing and walking, she gets a pulling sensation all the way to her knee.  Pain got much worse over the weekend and she went to the ER.  She is frustrated by her ongoing pain.  Physical therapy did not help, in fact it made it a little bit worse.              ROS: No fevers or chills, no night sweats.  All other systems were reviewed and are negative.  Objective: Vital Signs: There were no vitals taken for this visit.  Physical Exam:  General:  Alert and oriented, in no acute distress. Pulm:  Breathing unlabored. Psy:  Normal mood, congruent affect. Skin: No rash on her skin. Left hip: She has stiffness with internal rotation but no significant pain.  No tenderness over the greater trochanter.  No tenderness palpation of the SI joint or the sciatic notch, or the lumbar spine.  Cannot reproduce her pain by palpation today.  Imaging: None today.  Assessment & Plan: 1.  Persistent left posterior lateral hip and groin pain, etiology uncertain but could be due to left-sided L2-3 foraminal impingement which is evident on CT scan in February.  Dr. Ernestina Patches evaluated her as well today. -Elected to proceed with lumbar epidural injection per Dr. Ernestina Patches.  If no relief, could contemplate intra-articular hip injection versus lumbar MRI scan. - Refilled muscle relaxant.     Procedures: No procedures performed  No notes on file     PMFS History:  Patient Active Problem List   Diagnosis Date Noted  . Arthritis of left hip 07/29/2018  . Abdominal pain, left lower quadrant 07/20/2018  . Bradycardia 07/01/2018  . Dyspnea 07/01/2018  . Mild headache 01/05/2018  . Rhinorrhea 01/05/2018  . Chronic diastolic heart failure ([place]) 12/27/2017  . Left low back pain 03/11/2017  . Diastolic dysfunction 17/00/1749  . Estrogen deficiency 12/16/2016  . Routine general medical examination at a health care facility 12/16/2016  . CAD (coronary artery disease) 12/04/2016  . Dyspepsia 03/15/2016  . 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 Eczema 03/06/2015  . Hyperglycemia 02/11/2014  . Intertrigo labialis 12/07/2012  . CONSTIPATION, CHRONIC 07/03/2010  . Osteoporosis 06/24/2008  . HYPERCHOLESTEROLEMIA 12/08/2006  . Essential hypertension 12/08/2006  . GERD 12/08/2006  . H/O herpes zoster 12/08/2006   Past Medical History:  Diagnosis Date  . Allergy   . CAD (coronary artery disease)   . Clinical trial participant    [facility] studies in familial dementia  . Dyspnea 11/28/2016  . Frequent UTI   . GERD (gastroesophageal reflux disease)   . Hypertension   . Osteoarthritis    hips, back  . Osteopenia   . Pneumonia   . Spinal stenosis   . Stroke [facility]) 1985    Family History  Problem  Relation Age of Onset  . Lung cancer Brother   . Other Mother        kidney tumor  . Hypertension Mother   . Diabetes Sister   . Diabetes Brother   . Diabetes Sister        x 2  . Colon cancer Maternal Uncle   . Other Sister        intestine burst  . Breast cancer Maternal Aunt        x 2  . Breast cancer Daughter   . Diabetes Son        x 3  . Diabetes Daughter   . Esophageal cancer Neg Hx   . Rectal cancer Neg Hx   . Stomach cancer Neg Hx     Past Surgical History:  Procedure Laterality Date  . ABDOMINAL HYSTERECTOMY     partial has one ovary  . APPENDECTOMY    . CATARACT EXTRACTION Bilateral   . LEFT HEART CATH AND CORONARY  ANGIOGRAPHY N/A 11/29/2016   Procedure: Left Heart Cath and Coronary Angiography;  Surgeon: Sherren Mocha, MD;  Location: [facility] CV LAB;  Service: Cardiovascular;  Laterality: N/A;  . LUMBAR DISC SURGERY     x 3  . rotator cuff surgery Right 2010   Social History   Occupational History  . Occupation: RETIRED    Employer: RETIRED  Tobacco Use  . Smoking status: Never Smoker  . Smokeless tobacco: Never Used  Substance and Sexual Activity  . Alcohol use: No    Alcohol/week: 0.0 standard drinks  . Drug use: No  . Sexual activity: Not on file

## 2018-08-31 NOTE — Procedures (Signed)
Lumbar Epidural Steroid Injection - Interlaminar Approach with Fluoroscopic Guidance  Patient: Sara [name]      Date of Birth: 12/21/1931 MRN: [id number] PCP: Abner Greenspan, MD      Visit Date: 08/31/2018   Universal Protocol:     Consent Given By: the patient  Position: PRONE  Additional Comments: Vital signs were monitored before and after the procedure. Patient was prepped and draped in the usual sterile fashion. The correct patient, procedure, and site was verified.   Injection Procedure Details:  Procedure Site One Meds Administered:  Meds ordered this encounter  Medications  . diazepam (VALIUM) 5 MG tablet    Sig: Take 1 tablet (5 mg total) by mouth every 12 (twelve) hours as needed for muscle spasms.    Dispense:  10 tablet    Refill:  0  . methylPREDNISolone acetate (DEPO-MEDROL) injection 40 mg     Laterality: Left  Location/Site:  L2-L3  Needle size: 20 G  Needle type: Tuohy  Needle Placement: Paramedian epidural  Findings:   -Comments: Excellent flow of contrast along the nerve and into the epidural space.  Procedure Details: Using a paramedian approach from the side mentioned above, the region overlying the inferior lamina was localized under fluoroscopic visualization and the soft tissues overlying this structure were infiltrated with 4 ml. of 1% Lidocaine without Epinephrine. The Tuohy needle was inserted into the epidural space using a paramedian approach.   The epidural space was localized using loss of resistance along with lateral and bi-planar fluoroscopic views.  After negative aspirate for air, blood, and CSF, a 2 ml. volume of Isovue-250 was injected into the epidural space and the flow of contrast was observed. Radiographs were obtained for documentation purposes.    The injectate was administered into the level noted above.   Additional Comments:  The patient tolerated the procedure well Dressing: 2 x 2 sterile gauze and  Band-Aid    Post-procedure details: Patient was observed during the procedure. Post-procedure instructions were reviewed.  Patient left the clinic in stable condition.

## 2018-08-31 NOTE — Progress Notes (Signed)
Sara [name] - 85 y.o. female MRN [id number]  Date of birth: [birth year]  Office Visit Note: Visit Date: 08/31/2018 PCP: Abner Greenspan, MD Referred by: Tower, Wynelle Fanny, MD  Subjective: Chief Complaint  Patient presents with  . Lower Back - Pain    Pain mainly on the left side of lower back. Norco & muscle relaxer are helping.   HPI:  Sara [name] is a 85 y.o. female who comes in today At request of Dr. Junius Roads for work in L2-3 interlaminar epidural steroid injection for chronic worsening left hip and thigh pain.  ROS Otherwise per HPI.  Assessment & Plan: Visit Diagnoses:  1. Low back pain, unspecified back pain laterality, unspecified chronicity, unspecified whether sciatica present   2. Lumbar radiculopathy     Plan: No additional findings.   Meds & Orders:  Meds ordered this encounter  Medications  . diazepam (VALIUM) 5 MG tablet    Sig: Take 1 tablet (5 mg total) by mouth every 12 (twelve) hours as needed for muscle spasms.    Dispense:  10 tablet    Refill:  0  . methylPREDNISolone acetate (DEPO-MEDROL) injection 40 mg    Orders Placed This Encounter  Procedures  . XR C-ARM NO REPORT  . Epidural Steroid injection    Follow-up: Return if symptoms worsen or fail to improve.   Procedures: No procedures performed  Lumbar Epidural Steroid Injection - Interlaminar Approach with Fluoroscopic Guidance  Patient: Sara [name]      Date of Birth: [birth year] MRN: [id number] PCP: Abner Greenspan, MD      Visit Date: 08/31/2018   Universal Protocol:     Consent Given By: the patient  Position: PRONE  Additional Comments: Vital signs were monitored before and after the procedure. Patient was prepped and draped in the usual sterile fashion. The correct patient, procedure, and site was verified.   Injection Procedure Details:  Procedure Site One Meds Administered:  Meds ordered this encounter  Medications  . diazepam (VALIUM) 5 MG  tablet    Sig: Take 1 tablet (5 mg total) by mouth every 12 (twelve) hours as needed for muscle spasms.    Dispense:  10 tablet    Refill:  0  . methylPREDNISolone acetate (DEPO-MEDROL) injection 40 mg     Laterality: Left  Location/Site:  L2-L3  Needle size: 20 G  Needle type: Tuohy  Needle Placement: Paramedian epidural  Findings:   -Comments: Excellent flow of contrast along the nerve and into the epidural space.  Procedure Details: Using a paramedian approach from the side mentioned above, the region overlying the inferior lamina was localized under fluoroscopic visualization and the soft tissues overlying this structure were infiltrated with 4 ml. of 1% Lidocaine without Epinephrine. The Tuohy needle was inserted into the epidural space using a paramedian approach.   The epidural space was localized using loss of resistance along with lateral and bi-planar fluoroscopic views.  After negative aspirate for air, blood, and CSF, a 2 ml. volume of Isovue-250 was injected into the epidural space and the flow of contrast was observed. Radiographs were obtained for documentation purposes.    The injectate was administered into the level noted above.   Additional Comments:  The patient tolerated the procedure well Dressing: 2 x 2 sterile gauze and Band-Aid    Post-procedure details: Patient was observed during the procedure. Post-procedure instructions were reviewed.  Patient left the clinic in stable condition.   Clinical History: No specialty  comments available.     Objective:  VS:  HT:    WT:   BMI:     BP:   HR: bpm  TEMP: ( )  RESP:  Physical Exam  Ortho Exam Imaging: No results found.

## 2018-09-02 ENCOUNTER — Telehealth: Payer: Self-pay

## 2018-09-02 NOTE — Telephone Encounter (Signed)
   Primary Cardiologist:  No primary care provider on file.   Patient contacted.  History reviewed.  No symptoms to suggest any unstable cardiac conditions.  Based on discussion, with current pandemic situation, we will be postponing this appointment for Sara [name] with a plan for f/u in  wks or sooner if feasible/necessary.  If symptoms change, she has been instructed to contact our office.   Routing to C19 CANCEL pool for tracking (P CV DIV CV19 CANCEL - reason for visit "other.") and assigning priority (1 = 4-6 wks, 2 = 6-12 wks, 3 = >12 wks).   Jones Broom, CMA  09/02/2018 2:22 PM         .

## 2018-09-03 ENCOUNTER — Telehealth (INDEPENDENT_AMBULATORY_CARE_PROVIDER_SITE_OTHER): Payer: Self-pay | Admitting: Family Medicine

## 2018-09-03 NOTE — Telephone Encounter (Signed)
Sara [name] cell# [phone]

## 2018-09-03 NOTE — Telephone Encounter (Signed)
Patient called stated was supposed to get Hydrocodone and not there.  Please call patient to advise. Daughter upset because they have been twice and nothing there. Patient does not drive and she is having to keep going.  Please call patients daughter to advise [phone]

## 2018-09-04 MED ORDER — HYDROCODONE-ACETAMINOPHEN 5-325 MG PO TABS: 1.0000 | ORAL_TABLET | ORAL | 0 refills | Status: DC | PRN

## 2018-09-04 NOTE — Telephone Encounter (Signed)
I called and spoke with Pamala Hurry about her mom's medication - see other message on this from today.

## 2018-09-04 NOTE — Telephone Encounter (Signed)
Please advise 

## 2018-09-04 NOTE — Telephone Encounter (Signed)
It was sent on 3/21 and again just now.  Not sure why they didn't have it.

## 2018-09-04 NOTE — Telephone Encounter (Signed)
I called and spoke with Sara [name] to let her know this should be at the pharmacy already, but Dr. Junius Roads resent it this morning. I verified that it was [facility] - that is the correct pharmacy. She will check with the pharmacy later.

## 2018-09-07 ENCOUNTER — Ambulatory Visit: Payer: Medicare HMO | Admitting: Cardiovascular Disease

## 2018-09-15 ENCOUNTER — Telehealth (INDEPENDENT_AMBULATORY_CARE_PROVIDER_SITE_OTHER): Payer: Self-pay

## 2018-09-15 NOTE — Telephone Encounter (Signed)
Patients daughter called and states patient needs a Rf on Hydrocodone & Valium.  Pharmacy : [facility] [address].   CB: [phone]

## 2018-09-16 MED ORDER — DIAZEPAM 5 MG PO TABS: 5.0000 mg | ORAL_TABLET | Freq: Two times a day (BID) | ORAL | 0 refills | Status: DC | PRN

## 2018-09-16 MED ORDER — HYDROCODONE-ACETAMINOPHEN 5-325 MG PO TABS: 1.0000 | ORAL_TABLET | ORAL | 0 refills | Status: DC | PRN

## 2018-09-16 NOTE — Telephone Encounter (Signed)
Called her again and she picked up and is aware about RX being sent into pharm.

## 2018-09-16 NOTE — Telephone Encounter (Signed)
Sent!

## 2018-09-16 NOTE — Telephone Encounter (Signed)
Tried calling patient twice and they would pick up and hang up

## 2018-09-16 NOTE — Addendum Note (Signed)
Addended by: Hortencia Pilar on: 09/16/2018 08:35 AM   Modules accepted: Orders

## 2018-09-30 ENCOUNTER — Encounter (INDEPENDENT_AMBULATORY_CARE_PROVIDER_SITE_OTHER): Payer: Self-pay | Admitting: Orthopedic Surgery

## 2018-09-30 ENCOUNTER — Other Ambulatory Visit: Payer: Self-pay

## 2018-09-30 ENCOUNTER — Ambulatory Visit (INDEPENDENT_AMBULATORY_CARE_PROVIDER_SITE_OTHER): Payer: Medicare HMO | Admitting: Orthopedic Surgery

## 2018-09-30 DIAGNOSIS — M1712 Unilateral primary osteoarthritis, left knee: Secondary | ICD-10-CM

## 2018-10-02 ENCOUNTER — Encounter (INDEPENDENT_AMBULATORY_CARE_PROVIDER_SITE_OTHER): Payer: Self-pay | Admitting: Orthopedic Surgery

## 2018-10-02 DIAGNOSIS — M1712 Unilateral primary osteoarthritis, left knee: Secondary | ICD-10-CM | POA: Diagnosis not present

## 2018-10-02 MED ORDER — METHYLPREDNISOLONE ACETATE 40 MG/ML IJ SUSP
40.0000 mg | INTRAMUSCULAR | Status: AC | PRN
  Administered 2018-10-02: 10:00:00 40 mg via INTRA_ARTICULAR

## 2018-10-02 MED ORDER — LIDOCAINE HCL 1 % IJ SOLN
5.0000 mL | INTRAMUSCULAR | Status: AC | PRN
  Administered 2018-10-02: 5 mL

## 2018-10-02 MED ORDER — BUPIVACAINE HCL 0.25 % IJ SOLN
4.0000 mL | INTRAMUSCULAR | Status: AC | PRN
  Administered 2018-10-02: 4 mL via INTRA_ARTICULAR

## 2018-10-02 NOTE — Progress Notes (Signed)
Office Visit Note   Patient: Sara [name]           Date of Birth: 26-Apr-1932           MRN: [id number] Visit Date: 09/30/2018 Requested by: Tower, Wynelle Fanny, MD [place], [place] [address] PCP: Abner Greenspan, MD  Subjective: Chief Complaint  Patient presents with   Left Leg - Pain    HPI: Sara [name] is a patient with left knee pain and some back pain.  She has  had a prior lumbar spine fusion.  Reports some pain in the groin but also radiates down the leg into the knee.  She has mild medial compartment arthritis based on prior radiographs.  She has had 3 lumbar spine surgeries.              ROS: All systems reviewed are negative as they relate to the chief complaint within the history of present illness.  Patient denies  fevers or chills.   Assessment & Plan: Visit Diagnoses:  1. Unilateral primary osteoarthritis, left knee     Plan: Impression is mild medial compartment arthritis left knee with no definite effusion.  Plan is injection into the left knee today an MRI scan of the lumbar spine if she is no better.  Hard to say if this is actually coming from her knee or if this is radiating pain from the back.  We will do a diagnostic injection in the knee today but if it does not help then we will need to start working up her back as a possible source of the symptoms.  Follow-Up Instructions: Return if symptoms worsen or fail to improve.   Orders:  No orders of the defined types were placed in this encounter.  No orders of the defined types were placed in this encounter.     Procedures: Large Joint Inj: L knee on 10/02/2018 10:25 AM Indications: diagnostic evaluation, joint swelling and pain Details: 18 G 1.5 in needle, superolateral approach  Arthrogram: No  Medications: 5 mL lidocaine 1 %; 40 mg methylPREDNISolone acetate 40 MG/ML; 4 mL bupivacaine 0.25 % Outcome: tolerated well, no immediate complications Procedure, treatment alternatives,  risks and benefits explained, specific risks discussed. Consent was given by the patient. Immediately prior to procedure a time out was called to verify the correct patient, procedure, equipment, support staff and site/side marked as required. Patient was prepped and draped in the usual sterile fashion.       Clinical Data: No additional findings.  Objective: Vital Signs: There were no vitals taken for this visit.  Physical Exam:   Constitutional: Patient appears well-developed HEENT:  Head: Normocephalic Eyes:EOM are normal Neck: Normal range of motion Cardiovascular: Normal rate Pulmonary/chest: Effort normal Neurologic: Patient is alert Skin: Skin is warm Psychiatric: Patient has normal mood and affect    Ortho Exam: Ortho exam demonstrates full active and passive range of motion of the knee and hip.  No groin pain with internal X rotation of either leg.  No effusion in the left knee.  Collateral crucial ligaments are stable.  No masses lymphadenopathy or skin changes noted in that left knee region.  She has no definite paresthesias L1 S1 bilaterally but some subjective numbness on the anterior aspect of that left thigh compared to the right.  She has some pain with forward lateral bending.  Specialty Comments:  No specialty comments available.  Imaging: No results found.   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   Eczema  03/06/2015   Hyperglycemia 02/11/2014   Intertrigo labialis 12/07/2012   CONSTIPATION, CHRONIC 07/03/2010   Osteoporosis 06/24/2008   HYPERCHOLESTEROLEMIA 12/08/2006   Essential hypertension 12/08/2006   GERD 12/08/2006   H/O herpes zoster 12/08/2006   Past Medical History:  Diagnosis Date   Allergy    CAD (coronary artery disease)    Clinical trial participant    [facility] Genetic studies in familial dementia   Dyspnea 11/28/2016   Frequent UTI    GERD (gastroesophageal reflux disease)    Hypertension    Osteoarthritis    hips, back   Osteopenia    Pneumonia    Spinal stenosis    Stroke ([place]) 1985    Family History  Problem Relation Age of Onset   Lung cancer Brother    Other Mother        kidney tumor   Hypertension Mother    Diabetes Sister    Diabetes Brother    Diabetes Sister        x 2   Colon cancer Maternal Uncle    Other Sister        intestine burst   Breast cancer Maternal Aunt        x 2   Breast cancer Daughter    Diabetes Son        x 3   Diabetes Daughter    Esophageal cancer Neg Hx    Rectal cancer Neg Hx    Stomach cancer Neg Hx     Past Surgical History:  Procedure Laterality Date   ABDOMINAL HYSTERECTOMY     partial has one ovary   APPENDECTOMY     CATARACT EXTRACTION Bilateral    LEFT HEART CATH AND CORONARY ANGIOGRAPHY N/A 11/29/2016   Procedure: Left Heart Cath and Coronary Angiography;  Surgeon: Sherren Mocha, MD;  Location: [facility];  Service: Cardiovascular;  Laterality: N/A;   LUMBAR DISC SURGERY     x 3   rotator cuff surgery Right 2010   Social History   Occupational History   Occupation: RETIRED    Employer: RETIRED  Tobacco Use   Smoking status: Never Smoker   Smokeless tobacco: Never Used  Substance and Sexual Activity   Alcohol use: No    Alcohol/week: 0.0 standard drinks   Drug use: No   Sexual activity: Not on file

## 2018-10-12 ENCOUNTER — Telehealth: Payer: Self-pay

## 2018-10-12 ENCOUNTER — Telehealth: Payer: Self-pay | Admitting: Family Medicine

## 2018-10-12 NOTE — Telephone Encounter (Signed)
Patient's daughter,Barbara,called.  Patient had a virtual office visit scheduled with Dr.Tower for tomorrow.  When [place] told patient about the appointment, patient said she didn't want to do it.  Patient feels that no one can help her with her knee pain.  Barbara's worried about patient's emotional state. She said patient is depressed and she won't let her family help her.  Pamala Hurry cancelled appointment for tomorrow.  Pamala Hurry said if Dr.Tower tells patient what Pamala Hurry told her, she won't talk to them again. Pamala Hurry said if Dr.Tower needs to speak to her she can call her.

## 2018-10-12 NOTE — Telephone Encounter (Signed)
Is there a DPR for her daughter?   Thanks  It looks like orthopedics is looking after her knee and did px pain medication today (tramadol)

## 2018-10-12 NOTE — Telephone Encounter (Signed)
pls advise. Thanks.  

## 2018-10-12 NOTE — Telephone Encounter (Signed)
Patient's emergency contact, Pamala Hurry called stating that patient is having a lot of pain in her left knee.  Stated that patient had an injection in her knee on 09/30/2018, but injection has worn off.  Would like to know if patient can get a Rx for the pain or if patient needs to be seen again?  Cb# is [phone].  Please advise.  Thank you.

## 2018-10-12 NOTE — Telephone Encounter (Signed)
Not really a great idea to do narcotics for this long-term problem.  We could try 1 tramadol a day and if that helps and it is a chronic type situation may need to roll that over to primary care.  So for now we will try 1 tramadol a day #30 with no refills

## 2018-10-13 ENCOUNTER — Ambulatory Visit: Payer: Medicare HMO | Admitting: Family Medicine

## 2018-10-13 NOTE — Telephone Encounter (Signed)
IC advised per Dr Marlou Sa. Pamala Hurry patients daughter states that patient having issues with depression. I advised needed to consult with patients PCP about getting this addressed. She said she would call and get patient an appointment. They will hold off on the ultram for right now.

## 2018-10-13 NOTE — Telephone Encounter (Signed)
Spoken to patient's daughter that she would need a DPR filled.  She also mention that ortho did not give any medications until patient is seen by Dr Glori Bickers. Patient's daughter asked if we call patient and have her come in the office for appointment because if not patient may refuse otherwise.

## 2018-10-13 NOTE — Telephone Encounter (Signed)
The ortho phone note indicated that I would need to address depression symptoms with patient before she can try any pain medication  I would rather do a virtual visit and keep her out of here (phone would be fine)  Please call and tell her that ortho wants [place] to have a phone or virtual visit with her to discuss pain control  Thanks

## 2018-10-13 NOTE — Telephone Encounter (Signed)
Spoken and notified patient of Dr Marliss Coots comments. Telephone appt on 10/14/2018

## 2018-10-14 ENCOUNTER — Encounter: Payer: Self-pay | Admitting: Family Medicine

## 2018-10-14 ENCOUNTER — Ambulatory Visit (INDEPENDENT_AMBULATORY_CARE_PROVIDER_SITE_OTHER): Payer: Medicare HMO | Admitting: Family Medicine

## 2018-10-14 ENCOUNTER — Telehealth: Payer: Self-pay | Admitting: Orthopedic Surgery

## 2018-10-14 DIAGNOSIS — M545 Low back pain, unspecified: Secondary | ICD-10-CM

## 2018-10-14 DIAGNOSIS — M25562 Pain in left knee: Secondary | ICD-10-CM | POA: Insufficient documentation

## 2018-10-14 DIAGNOSIS — G8929 Other chronic pain: Secondary | ICD-10-CM | POA: Diagnosis not present

## 2018-10-14 MED ORDER — TRAMADOL HCL 50 MG PO TABS: 50.0000 mg | ORAL_TABLET | Freq: Two times a day (BID) | ORAL | 0 refills | Status: DC | PRN

## 2018-10-14 NOTE — Telephone Encounter (Signed)
Y lspine mri  thx

## 2018-10-14 NOTE — Telephone Encounter (Signed)
pls advise. Thanks.  

## 2018-10-14 NOTE — Assessment & Plan Note (Signed)
Not improved with knee injection  ? From orthopedics is whether this is radiculopathy Both knee/hip films show degenerative change  Sent tramadol for use with caution /severe pain (disc poss of constipation and need for more miralax and stool softeners if needed)

## 2018-10-14 NOTE — Telephone Encounter (Signed)
Put order in for scan. Advised patient of this.

## 2018-10-14 NOTE — Telephone Encounter (Signed)
Pt daughter called stating she spoke with pt Primary Care Dr.  And they would like to know if Dr. Marlou Sa can set her up to have a MRI done.

## 2018-10-14 NOTE — Patient Instructions (Signed)
Follow up with orthopedics regarding MRI since you still have leg pain  I sent tramadol to pharmacy to use for severe pain - caution of sedation /dizziness/constipation (use with great caution)  Take care of yourself If you feel depressed please let us know

## 2018-10-14 NOTE — Progress Notes (Signed)
Virtual Visit via Telephone Note  I connected with Sara [name] on 10/14/18 at 12:00 PM EDT by telephone and verified that I am speaking with the correct person using two identifiers.  Location: Patient: home  Provider: office    I discussed the limitations, risks, security and privacy concerns of performing an evaluation and management service by telephone and the availability of in person appointments. I also discussed with the patient that there may be a patient responsible charge related to this service. The patient expressed understanding and agreed to proceed.   History of Present Illness: Presents for issues with knee/leg pain   She saw orthopedics -they were going to start her on tramadol but her daughter mentioned that she may be depressed so they held off until we could talk to her   Seen by Dr Marlou Sa for L knee pain and some back pain  H/o prior ls fusion  Pain in groin radiates down into leg and knee  Found medial compartment knee OA on radiographs Knee injection was tried  MRI scan of LS was suggested if no improvement   She cannot remember how she does with tramadol  She gets constipated easily   Some relief when she lays her whole body down  Hurts more when she eats- ? Why (unsure if gas affects this)  BM is twice daily - sometimes not a lot of volume  miralax once daily Stool softeners as well   No rash at all   Ice on knee- ? Help  Also on side and back   No fever  No n/v    Taking tylenol - helps a tiny bit if any  advil helps just a little   Does feel more anxious or depressed being stuck in home and in pain/but not severely so  No thoughts of self harm  Review of Systems  Constitutional: Negative for chills, fever, malaise/fatigue and weight loss.  Respiratory: Negative for cough and shortness of breath.   Cardiovascular: Negative for chest pain and palpitations.  Musculoskeletal: Positive for back pain, joint pain and myalgias.   Left leg /low abd and groin pain -down to the knee  Skin: Negative for itching and rash.  Neurological: Positive for dizziness. Negative for tingling, tremors, sensory change, focal weakness and headaches.  Psychiatric/Behavioral: The patient is nervous/anxious.        Frustrated    Patient Active Problem List   Diagnosis Date Noted  . Knee pain, left 10/14/2018  . Arthritis of left hip 07/29/2018  . Abdominal pain, left lower quadrant 07/20/2018  . Bradycardia 07/01/2018  . Dyspnea 07/01/2018  . Mild headache 01/05/2018  . Rhinorrhea 01/05/2018  . Chronic diastolic heart failure ([place]) 12/27/2017  . Left low back pain 03/11/2017  . Diastolic dysfunction 13/24/4010  . Estrogen deficiency 12/16/2016  . Routine general medical examination at a health care facility 12/16/2016  . CAD (coronary artery disease) 12/04/2016  . Dyspepsia 03/15/2016  . 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 Eczema 03/06/2015  . Hyperglycemia 02/11/2014  . Intertrigo labialis 12/07/2012  . CONSTIPATION, CHRONIC 07/03/2010  . Osteoporosis 06/24/2008  . HYPERCHOLESTEROLEMIA 12/08/2006  . Essential hypertension 12/08/2006  . GERD 12/08/2006  . H/O herpes zoster 12/08/2006   Past Medical History:  Diagnosis Date  . Allergy   . CAD (coronary artery disease)   . Clinical trial participant    [facility] studies in familial dementia  . Dyspnea 11/28/2016  . Frequent UTI   . GERD (gastroesophageal reflux  disease)   . Hypertension   . Osteoarthritis    hips, back  . Osteopenia   . Pneumonia   . Spinal stenosis   . Stroke [facility]) 1985   Past Surgical History:  Procedure Laterality Date  . ABDOMINAL HYSTERECTOMY     partial has one ovary  . APPENDECTOMY    . CATARACT EXTRACTION Bilateral   . LEFT HEART CATH AND CORONARY ANGIOGRAPHY N/A 11/29/2016   Procedure: Left Heart Cath and Coronary Angiography;  Surgeon: Sherren Mocha, MD;  Location: [facility];  Service: Cardiovascular;  Laterality:  N/A;  . LUMBAR DISC SURGERY     x 3  . rotator cuff surgery Right 2010   Social History   Tobacco Use  . Smoking status: Never Smoker  . Smokeless tobacco: Never Used  Substance Use Topics  . Alcohol use: No    Alcohol/week: 0.0 standard drinks  . Drug use: No   Family History  Problem Relation Age of Onset  . Lung cancer Brother   . Other Mother        kidney tumor  . Hypertension Mother   . Diabetes Sister   . Diabetes Brother   . Diabetes Sister        x 2  . Colon cancer Maternal Uncle   . Other Sister        intestine burst  . Breast cancer Maternal Aunt        x 2  . Breast cancer Daughter   . Diabetes Son        x 3  . Diabetes Daughter   . Esophageal cancer Neg Hx   . Rectal cancer Neg Hx   . Stomach cancer Neg Hx    Allergies  Allergen Reactions  . Amoxicillin     Reaction not known  . Atorvastatin     aching  . Cefpodoxime Proxetil     Reaction not known  . Clarithromycin     Reaction not known  . Fosamax [Alendronate Sodium]     Body pain   . Furosemide     REACTION: doesn't help  . Gabapentin Swelling    Caused severe swellling   Current Outpatient Medications on File Prior to Visit  Medication Sig Dispense Refill  . acetaminophen-codeine (TYLENOL #3) 300-30 MG tablet Take 1 tablet by mouth every 6 (six) hours as needed for moderate pain or severe pain. 30 tablet 0  . aspirin EC 81 MG tablet Take 81 mg by mouth daily.      . Cholecalciferol (VITAMIN D-3) 1000 UNITS CAPS Take 1,000 Units by mouth daily.     . diazepam (VALIUM) 5 MG tablet Take 1 tablet (5 mg total) by mouth every 12 (twelve) hours as needed for muscle spasms. 10 tablet 0  . diltiazem (CARTIA XT) 120 MG 24 hr capsule Take 1 capsule (120 mg total) by mouth daily. 90 capsule 3  . Docusate Sodium (EQ STOOL SOFTENER PO) Take 100 mg by mouth daily as needed (constipation).     Marland Kitchen doxylamine, Sleep, (EQ SLEEP AID) 25 MG tablet Take 25 mg by mouth at bedtime as needed for sleep.     .  hydrochlorothiazide (HYDRODIURIL) 25 MG tablet Take 1 tablet (25 mg total) by mouth daily. 90 tablet 3  . nitroGLYCERIN (NITROSTAT) 0.4 MG SL tablet Place 1 tablet (0.4 mg total) under the tongue every 5 (five) minutes as needed for chest pain (max 3 doses in 15 minutes). 25 tablet 3  .  polyethylene glycol (MIRALAX / GLYCOLAX) packet Take 17 g by mouth daily.     . potassium chloride (K-DUR,KLOR-CON) 10 MEQ tablet Take 1 tablet (10 mEq total) by mouth daily. 90 tablet 3   No current facility-administered medications on file prior to visit.     Observations/Objective: Pt sounds like her usual self Not in distress Not tearful  Perhaps mildly anxious but does not sound depressed  Nl voice/not hoarse  No cough or sob during interview  Mentally sharp  Assessment and Plan: Problem List Items Addressed This Visit      Other   Left low back pain - Primary    With suspected radiculopathy  Past imaging rev incl CT of abd and pelvis  Rev orthopedic notes  No improvement with consv tx and also knee injection  Enc her to go ahead and pursue MRI which was next step  Sent in tramadol for pain to use with caution of sedation/dizziness/habit and constipation  Will reach out to her orthopedic provider       Relevant Medications   traMADol (ULTRAM) 50 MG tablet   Knee pain, left    Not improved with knee injection  ? From orthopedics is whether this is radiculopathy Both knee/hip films show degenerative change  Sent tramadol for use with caution /severe pain (disc poss of constipation and need for more miralax and stool softeners if needed)           Follow Up Instructions: Follow up with orthopedics regarding MRI since you still have leg pain  I sent tramadol to pharmacy to use for severe pain - caution of sedation /dizziness/constipation (use with great caution)  Take care of yourself If you feel depressed please let us know    I discussed the assessment and treatment plan with the  patient. The patient was provided an opportunity to ask questions and all were answered. The patient agreed with the plan and demonstrated an understanding of the instructions.   The patient was advised to call back or seek an in-person evaluation if the symptoms worsen or if the condition fails to improve as anticipated.  I provided 15 minutes of non-face-to-face time during this encounter.   Loura Pardon, MD

## 2018-10-14 NOTE — Assessment & Plan Note (Signed)
With suspected radiculopathy  Past imaging rev incl CT of abd and pelvis  Rev orthopedic notes  No improvement with consv tx and also knee injection  Enc her to go ahead and pursue MRI which was next step  Sent in tramadol for pain to use with caution of sedation/dizziness/habit and constipation  Will reach out to her orthopedic provider

## 2018-10-29 ENCOUNTER — Other Ambulatory Visit: Payer: Self-pay

## 2018-10-29 ENCOUNTER — Ambulatory Visit
Admission: RE | Admit: 2018-10-29 | Discharge: 2018-10-29 | Disposition: A | Discharge status: Home / Self Care | Payer: Medicare HMO | Source: Ambulatory Visit | Attending: Orthopedic Surgery | Admitting: Orthopedic Surgery

## 2018-10-29 DIAGNOSIS — M545 Low back pain, unspecified: Secondary | ICD-10-CM

## 2018-10-29 DIAGNOSIS — M48061 Spinal stenosis, lumbar region without neurogenic claudication: Secondary | ICD-10-CM | POA: Diagnosis not present

## 2018-11-05 ENCOUNTER — Ambulatory Visit (INDEPENDENT_AMBULATORY_CARE_PROVIDER_SITE_OTHER): Payer: Medicare HMO | Admitting: Specialist

## 2018-11-05 ENCOUNTER — Encounter: Payer: Self-pay | Admitting: Orthopedic Surgery

## 2018-11-05 ENCOUNTER — Other Ambulatory Visit: Payer: Self-pay

## 2018-11-05 VITALS — BP 152/93 | HR 80 | Ht 62.0 in | Wt 149.0 lb

## 2018-11-05 DIAGNOSIS — M5116 Intervertebral disc disorders with radiculopathy, lumbar region: Secondary | ICD-10-CM | POA: Diagnosis not present

## 2018-11-05 DIAGNOSIS — Z981 Arthrodesis status: Secondary | ICD-10-CM

## 2018-11-05 DIAGNOSIS — M5136 Other intervertebral disc degeneration, lumbar region: Secondary | ICD-10-CM | POA: Diagnosis not present

## 2018-11-05 MED ORDER — TRAMADOL HCL 50 MG PO TABS: 50.0000 mg | ORAL_TABLET | Freq: Four times a day (QID) | ORAL | 0 refills | Status: DC | PRN

## 2018-11-05 MED ORDER — AMITRIPTYLINE HCL 10 MG PO TABS: 10.0000 mg | ORAL_TABLET | Freq: Every day | ORAL | 1 refills | Status: DC

## 2018-11-05 NOTE — Patient Instructions (Signed)
Avoid bending, stooping and avoid lifting weights greater than 10 lbs. Avoid prolong standing and walking. Avoid frequent bending and stooping  No lifting greater than 10 lbs. May use ice or moist heat for pain. Weight loss is of benefit. Handicap license is approved. Dr. Newton's secretary/Assistant will call to arrange for epidural steroid injection  

## 2018-11-05 NOTE — Progress Notes (Addendum)
Office Visit Note   Patient: Sara [name]           Date of Birth: 07/23/1931           MRN: [id number] Visit Date: 11/05/2018              Requested by: Tower, Wynelle Fanny, MD [place], [place] [address] PCP: Abner Greenspan, MD   Assessment & Plan: Visit Diagnoses:  1. Herniation of lumbar intervertebral disc with radiculopathy   2. History of lumbar spinal fusion   3. Degenerative disc disease, lumbar   85 year old female with 2 1/2 month history of left groin pain, with neurogenic claudication symptoms. Left groin pain and radiation along the medial left thigh and leg. Pain worse with standing and walking. Clinically good strength and normal reflexes, MRI with left L2-3 extruded HNP left lateral recess and along the medial aspect of the left L3 pedicle affecting the left L3 nerve root at its entry into the left L3 neuroforamen. Pain pattern is consistent with left L3 radiculopathy, motor and reflexes are normal. Will try a left L3 transformamenal ESI. As she had no improvement with right L2-3 ESI 3/23, if no improvement then consider left L2-3 microdiscectomy.   Plan: Avoid bending, stooping and avoid lifting weights greater than 10 lbs. Avoid prolong standing and walking. Avoid frequent bending and stooping  No lifting greater than 10 lbs. May use ice or moist heat for pain. Weight loss is of benefit. Handicap license is approved. Dr. Romona Curls secretary/Assistant will call to arrange for epidural steroid injection    Follow-Up Instructions: Return in about 3 weeks (around 11/26/2018).   Orders:  Orders Placed This Encounter  Procedures  . Ambulatory referral to Physical Medicine Rehab   Meds ordered this encounter  Medications  . amitriptyline (ELAVIL) 10 MG tablet    Sig: Take 1 tablet (10 mg total) by mouth at bedtime.    Dispense:  30 tablet    Refill:  1  . traMADol (ULTRAM) 50 MG tablet    Sig: Take 1 tablet (50 mg total) by mouth every 6  (six) hours as needed.    Dispense:  30 tablet    Refill:  0      Procedures: No procedures performed   Clinical Data: Findings:  CLINICAL DATA:  Lower back pain greater on the left where there is extension to the leg  EXAM: MRI LUMBAR SPINE WITHOUT CONTRAST  TECHNIQUE: Multiplanar, multisequence MR imaging of the lumbar spine was performed. No intravenous contrast was administered.  COMPARISON:  Lumbar myelogram 11/30/2009  FINDINGS: Segmentation:  Standard lumbar numbering  Alignment: Fused grade 1 anterolisthesis at L4-5. Slight retrolisthesis at L3-4. There is up to 2/3 mm of retrolisthesis at L2-3 and L1-2.  Vertebrae:  No fracture, evidence of discitis, or bone lesion.  Conus medullaris and cauda equina: Conus extends to the L1 level. Conus and cauda equina appear normal.  Paraspinal and other soft tissues: Negative  Disc levels:  T12- L1: Spondylosis with a small left paracentral protrusion. No impingement  L1-L2: Spondylosis with disc narrowing and bulging.  No impingement  L2-L3: Disc narrowing and bulging with a left paracentral inferiorly migrating disc herniation. Posterior element hypertrophy. Spinal stenosis is moderate and progressive. There is complete effacement the left subarticular recess impinging on the L3 nerve root. High-grade left foraminal impingement. Moderate to advanced right foraminal narrowing  L3-L4: Disc narrowing and bulging with posterior element hypertrophy. Patulous spinal  canal this level. There is a small left paracentral protrusion but is noncompressive. Moderate left and mild-to-moderate right foraminal narrowing  L4-L5: PLIF.  No impingement  L5-S1:Degenerative facet spurring. Disc narrowing and bulging. No impingement  IMPRESSION: 1. Multilevel degenerative disease with listhesis that has progressed from 2011 myelogram. 2. L2-3 high-grade spinal stenosis. There is asymmetric left subarticular  recess impingement due to a paracentral protrusion. Bilateral foraminal impingement at this level, more advanced on the left. 3. L3-4 moderate bilateral foraminal stenosis. 4. L4-5 PLIF with solid posterolateral arthrodesis.  No impingement.   Electronically Signed   By: Monte Fantasia M.D.   On: 10/30/2018 05:41     Subjective: Chief Complaint  Patient presents with  . Lower Back - Follow-up    85 year old female with history of lumbar laminectomy 2007 then decompression lumbar spine with L4-5 fusion for spondylolisthesis 03/2007, right shouder RCR by Dr. Ninfa Linden. She did well until 08/2018 when she began having left groin pain and pain with standing and  Walking. She has some pain in the right groin but not as much. She lives alone at home and her daughter and sons bring her nourishment. She is not able to grocery shop, had to stop going to church. She is able to walk some steps and has a ramp for her home. Seen by Dr.Dean and evaluated and MRI of the lumbar spine was done.  Some pain with cough and sneezing. She has been taking mirilax for constipation. She feels like water is running the left leg. She is taking tylenol and tramadol for pain. The pain is left groin and it is worse with standing and walking, better with sitting and then worsens with sitting too long, lying down feels the best.    Review of Systems  Constitutional: Negative for activity change, appetite change, chills, diaphoresis, fatigue, fever and unexpected weight change.  HENT: Positive for congestion, postnasal drip, rhinorrhea, sinus pressure, sinus pain and sneezing. Negative for dental problem, drooling, ear discharge, ear pain, facial swelling, hearing loss, mouth sores, nosebleeds, sore throat, tinnitus, trouble swallowing and voice change.   Respiratory: Negative for apnea, cough, choking, chest tightness, shortness of breath, wheezing and stridor.   Cardiovascular: Negative for chest pain, palpitations  and leg swelling.  Gastrointestinal: Positive for constipation. Negative for abdominal distention, abdominal pain, anal bleeding, blood in stool, diarrhea, nausea, rectal pain and vomiting.  Endocrine: Negative for cold intolerance, heat intolerance, polydipsia, polyphagia and polyuria.  Genitourinary: Negative for difficulty urinating, dyspareunia, dysuria, enuresis, flank pain, frequency, genital sores, hematuria, pelvic pain and urgency.  Musculoskeletal: Positive for arthralgias, back pain and gait problem. Negative for joint swelling, myalgias, neck pain and neck stiffness.  Skin: Negative for color change, pallor and rash.  Allergic/Immunologic: Positive for environmental allergies. Negative for food allergies and immunocompromised state.  Neurological: Positive for weakness and numbness. Negative for dizziness, seizures, facial asymmetry, light-headedness and headaches.  Hematological: Negative for adenopathy. Does not bruise/bleed easily.  Psychiatric/Behavioral: Negative for agitation, behavioral problems, confusion, decreased concentration, dysphoric mood, hallucinations, self-injury, sleep disturbance and suicidal ideas. The patient is not nervous/anxious and is not hyperactive.      Objective: Vital Signs: There were no vitals taken for this visit.  Physical Exam Constitutional:      Appearance: She is well-developed.  HENT:     Head: Normocephalic and atraumatic.  Eyes:     Pupils: Pupils are equal, round, and reactive to light.  Neck:     Musculoskeletal: Normal range of  motion and neck supple.  Pulmonary:     Effort: Pulmonary effort is normal.     Breath sounds: Normal breath sounds.  Abdominal:     General: Bowel sounds are normal.     Palpations: Abdomen is soft.  Skin:    General: Skin is warm and dry.  Neurological:     Mental Status: She is alert and oriented to person, place, and time.  Psychiatric:        Behavior: Behavior normal.        Thought Content:  Thought content normal.        Judgment: Judgment normal.     Back Exam   Tenderness  The patient is experiencing tenderness in the lumbar.  Range of Motion  Extension: abnormal  Flexion: normal  Lateral bend right: abnormal  Lateral bend left: abnormal  Rotation right: abnormal  Rotation left: abnormal   Muscle Strength  Right Quadriceps:  5/5  Right Hamstrings:  5/5   Tests  Straight leg raise right: negative Straight leg raise left: negative  Reflexes  Patellar: 2/4 Achilles: 2/4 Babinski's sign: normal   Other  Toe walk: abnormal Heel walk: abnormal Sensation: normal Erythema: no back redness Scars: absent      Specialty Comments:  No specialty comments available.  Imaging: No results found.   PMFS History: Patient Active Problem List   Diagnosis Date Noted  . Knee pain, left 10/14/2018  . Arthritis of left hip 07/29/2018  . Abdominal pain, left lower quadrant 07/20/2018  . Bradycardia 07/01/2018  . Dyspnea 07/01/2018  . Mild headache 01/05/2018  . Rhinorrhea 01/05/2018  . Chronic diastolic heart failure ([place]) 12/27/2017  . Left low back pain 03/11/2017  . Diastolic dysfunction 85/27/7824  . Estrogen deficiency 12/16/2016  . Routine general medical examination at a health care facility 12/16/2016  . CAD (coronary artery disease) 12/04/2016  . Dyspepsia 03/15/2016  . 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 Eczema 03/06/2015  . Hyperglycemia 02/11/2014  . Intertrigo labialis 12/07/2012  . CONSTIPATION, CHRONIC 07/03/2010  . Osteoporosis 06/24/2008  . HYPERCHOLESTEROLEMIA 12/08/2006  . Essential hypertension 12/08/2006  . GERD 12/08/2006  . H/O herpes zoster 12/08/2006   Past Medical History:  Diagnosis Date  . Allergy   . CAD (coronary artery disease)   . Clinical trial participant    [facility] studies in familial dementia  . Dyspnea 11/28/2016  . Frequent UTI   . GERD (gastroesophageal reflux disease)   . Hypertension   .  Osteoarthritis    hips, back  . Osteopenia   . Pneumonia   . Spinal stenosis   . Stroke [facility]) 1985    Family History  Problem Relation Age of Onset  . Lung cancer Brother   . Other Mother        kidney tumor  . Hypertension Mother   . Diabetes Sister   . Diabetes Brother   . Diabetes Sister        x 2  . Colon cancer Maternal Uncle   . Other Sister        intestine burst  . Breast cancer Maternal Aunt        x 2  . Breast cancer Daughter   . Diabetes Son        x 3  . Diabetes Daughter   . Esophageal cancer Neg Hx   . Rectal cancer Neg Hx   . Stomach cancer Neg Hx     Past Surgical History:  Procedure Laterality  Date  . ABDOMINAL HYSTERECTOMY     partial has one ovary  . APPENDECTOMY    . CATARACT EXTRACTION Bilateral   . LEFT HEART CATH AND CORONARY ANGIOGRAPHY N/A 11/29/2016   Procedure: Left Heart Cath and Coronary Angiography;  Surgeon: Sherren Mocha, MD;  Location: [facility];  Service: Cardiovascular;  Laterality: N/A;  . LUMBAR DISC SURGERY     x 3  . rotator cuff surgery Right 2010   Social History   Occupational History  . Occupation: RETIRED    Employer: RETIRED  Tobacco Use  . Smoking status: Never Smoker  . Smokeless tobacco: Never Used  Substance and Sexual Activity  . Alcohol use: No    Alcohol/week: 0.0 standard drinks  . Drug use: No  . Sexual activity: Not on file

## 2018-11-07 ENCOUNTER — Telehealth: Payer: Self-pay | Admitting: Internal Medicine

## 2018-11-07 DIAGNOSIS — Z20822 Contact with and (suspected) exposure to covid-19: Secondary | ICD-10-CM

## 2018-11-07 NOTE — Telephone Encounter (Signed)
  Spoke with the patient about possible Covid-19 exposure at 11/05/18 office visit to [facility]. RN explained the free Covid-19 screening offered for this exposure. Notified that she and her driver should wear a masks and the drive-up screening will take place at the old [facility] on [address].  Patient and daughter attended the office visit on 11/05/18.  Requested appointment for 11/08/18, 1030 at [facility] location.

## 2018-11-08 ENCOUNTER — Other Ambulatory Visit: Payer: Medicare HMO

## 2018-11-08 DIAGNOSIS — R6889 Other general symptoms and signs: Secondary | ICD-10-CM | POA: Diagnosis not present

## 2018-11-09 LAB — NOVEL CORONAVIRUS, NAA: SARS-CoV-2, NAA: NOT DETECTED

## 2018-11-11 ENCOUNTER — Telehealth: Payer: Self-pay | Admitting: Family Medicine

## 2018-11-11 NOTE — Telephone Encounter (Signed)
It looks negative- how is she feeling?

## 2018-11-11 NOTE — Telephone Encounter (Signed)
Aware, thanks!

## 2018-11-11 NOTE — Telephone Encounter (Signed)
Pt notified test was negative. Pt said she didn't have any covid-19 sxs someone at the ortho office had Covid-19 so everyone had to be tested. Pt did say she is still has a lot of back pain, she is laying down a lot because it feels better to lay down but if she gets up and walks around she is in sever pain. Pt said ortho is going to try and give her a back injection soon but if that doesn't help she may have to have back surgery. Pt thanked Dr. Glori Bickers for giving her, her results and said that eased her mind.

## 2018-11-11 NOTE — Telephone Encounter (Signed)
Best number [phone] Pt called wanting to know if you received the results of her covid19 test [facility]  I advise they would contact to let her know.  But she stated her daughter pcp had the results and let her daughter know.

## 2018-11-11 NOTE — Telephone Encounter (Signed)
See prev note and also please review pt's labs results

## 2018-11-25 ENCOUNTER — Other Ambulatory Visit: Payer: Self-pay | Admitting: Family Medicine

## 2018-12-07 ENCOUNTER — Other Ambulatory Visit: Payer: Self-pay

## 2018-12-07 ENCOUNTER — Encounter: Payer: Self-pay | Admitting: Physical Medicine and Rehabilitation

## 2018-12-07 ENCOUNTER — Ambulatory Visit: Payer: Self-pay

## 2018-12-07 ENCOUNTER — Ambulatory Visit (INDEPENDENT_AMBULATORY_CARE_PROVIDER_SITE_OTHER): Payer: Medicare HMO | Admitting: Physical Medicine and Rehabilitation

## 2018-12-07 VITALS — BP 168/89 | HR 72

## 2018-12-07 DIAGNOSIS — M5416 Radiculopathy, lumbar region: Secondary | ICD-10-CM

## 2018-12-07 MED ORDER — BETAMETHASONE SOD PHOS & ACET 6 (3-3) MG/ML IJ SUSP
12.0000 mg | Freq: Once | INTRAMUSCULAR | Status: AC
  Administered 2018-12-07: 12 mg

## 2018-12-07 NOTE — Procedures (Signed)
Lumbosacral Transforaminal Epidural Steroid Injection - Sub-Pedicular Approach with Fluoroscopic Guidance  Patient: Sara [name]      Date of Birth: May 26, 1932 MRN: [id number] PCP: Abner Greenspan, MD      Visit Date: 12/07/2018   Universal Protocol:    Date/Time: 12/07/2018  Consent Given By: the patient  Position: PRONE  Additional Comments: Vital signs were monitored before and after the procedure. Patient was prepped and draped in the usual sterile fashion. The correct patient, procedure, and site was verified.   Injection Procedure Details:  Procedure Site One Meds Administered:  Meds ordered this encounter  Medications  . betamethasone acetate-betamethasone sodium phosphate (CELESTONE) injection 12 mg    Laterality: Left  Location/Site:  L3-L4  Needle size: 22 G  Needle type: Spinal  Needle Placement: Transforaminal  Findings:    -Comments: Excellent flow of contrast along the nerve and into the epidural space.  Procedure Details: After squaring off the end-plates to get a true AP view, the C-arm was positioned so that an oblique view of the foramen as noted above was visualized. The target area is just inferior to the "nose of the scotty dog" or sub pedicular. The soft tissues overlying this structure were infiltrated with 2-3 ml. of 1% Lidocaine without Epinephrine.  The spinal needle was inserted toward the target using a "trajectory" view along the fluoroscope beam.  Under AP and lateral visualization, the needle was advanced so it did not puncture dura and was located close the 6 O'Clock position of the pedical in AP tracterory. Biplanar projections were used to confirm position. Aspiration was confirmed to be negative for CSF and/or blood. A 1-2 ml. volume of Isovue-250 was injected and flow of contrast was noted at each level. Radiographs were obtained for documentation purposes.   After attaining the desired flow of contrast documented above, a  0.5 to 1.0 ml test dose of 0.25% Marcaine was injected into each respective transforaminal space.  The patient was observed for 90 seconds post injection.  After no sensory deficits were reported, and normal lower extremity motor function was noted,   the above injectate was administered so that equal amounts of the injectate were placed at each foramen (level) into the transforaminal epidural space.   Additional Comments:   Dressing: 2 x 2 sterile gauze and Band-Aid    Post-procedure details: Patient was observed during the procedure. Post-procedure instructions were reviewed.  Patient left the clinic in stable condition.

## 2018-12-07 NOTE — Progress Notes (Signed)
    Numeric Pain Rating Scale and Functional Assessment Average Pain (7)   In the last MONTH (on 0-10 scale) has pain interfered with the following?  1. General activity like being  able to carry out your everyday physical activities such as walking, climbing stairs, carrying groceries, or moving a chair?  Rating(10)   +Driver, -BT, -Dye Allergies.   

## 2018-12-07 NOTE — Progress Notes (Signed)
ZAINAB CRUMRINE - 85 y.o. female MRN [id number]  Date of birth: Jul 20, 1931  Office Visit Note: Visit Date: 12/07/2018 PCP: Abner Greenspan, MD Referred by: Tower, Wynelle Fanny, MD  Subjective: Chief Complaint  Patient presents with  . Lower Back - Pain   HPI:  Sara [name] is a 85 y.o. female who comes in today At the request of Basil Dess for left L3 transforaminal epidural steroid injection.  Left L2-3 interlaminar injection was not very beneficial.  Patient's had prior lumbar fusion at L4-5.  She does have L2-3 disc protrusion foraminal and lateral recess.  This is on the left and does fit with her left buttock hip and anterior thigh pain.  She also has some right L5 symptoms into the lower calf.  She also has some left shoulder blade pain as well.  I did discuss with her that the shoulder blade pain was not coming from the lower back that that would be something requiring further evaluation.  We will see how she does with the diagnostic L3 injection.  ROS Otherwise per HPI.  Assessment & Plan: Visit Diagnoses:  1. Lumbar radiculopathy     Plan: No additional findings.   Meds & Orders:  Meds ordered this encounter  Medications  . betamethasone acetate-betamethasone sodium phosphate (CELESTONE) injection 12 mg    Orders Placed This Encounter  Procedures  . XR C-ARM NO REPORT  . Epidural Steroid injection    Follow-up: Return if symptoms worsen or fail to improve.   Procedures: No procedures performed  Lumbosacral Transforaminal Epidural Steroid Injection - Sub-Pedicular Approach with Fluoroscopic Guidance  Patient: Sara [name]      Date of Birth: 1932-01-31 MRN: [id number] PCP: Abner Greenspan, MD      Visit Date: 12/07/2018   Universal Protocol:    Date/Time: 12/07/2018  Consent Given By: the patient  Position: PRONE  Additional Comments: Vital signs were monitored before and after the procedure. Patient was prepped and draped in the  usual sterile fashion. The correct patient, procedure, and site was verified.   Injection Procedure Details:  Procedure Site One Meds Administered:  Meds ordered this encounter  Medications  . betamethasone acetate-betamethasone sodium phosphate (CELESTONE) injection 12 mg    Laterality: Left  Location/Site:  L3-L4  Needle size: 22 G  Needle type: Spinal  Needle Placement: Transforaminal  Findings:    -Comments: Excellent flow of contrast along the nerve and into the epidural space.  Procedure Details: After squaring off the end-plates to get a true AP view, the C-arm was positioned so that an oblique view of the foramen as noted above was visualized. The target area is just inferior to the "nose of the scotty dog" or sub pedicular. The soft tissues overlying this structure were infiltrated with 2-3 ml. of 1% Lidocaine without Epinephrine.  The spinal needle was inserted toward the target using a "trajectory" view along the fluoroscope beam.  Under AP and lateral visualization, the needle was advanced so it did not puncture dura and was located close the 6 O'Clock position of the pedical in AP tracterory. Biplanar projections were used to confirm position. Aspiration was confirmed to be negative for CSF and/or blood. A 1-2 ml. volume of Isovue-250 was injected and flow of contrast was noted at each level. Radiographs were obtained for documentation purposes.   After attaining the desired flow of contrast documented above, a 0.5 to 1.0 ml test dose of 0.25% Marcaine was injected into each respective  transforaminal space.  The patient was observed for 90 seconds post injection.  After no sensory deficits were reported, and normal lower extremity motor function was noted,   the above injectate was administered so that equal amounts of the injectate were placed at each foramen (level) into the transforaminal epidural space.   Additional Comments:   Dressing: 2 x 2 sterile gauze and  Band-Aid    Post-procedure details: Patient was observed during the procedure. Post-procedure instructions were reviewed.  Patient left the clinic in stable condition.     Clinical History: No specialty comments available.     Objective:  VS:  HT:    WT:   BMI:     BP:(!) 168/89  HR:72bpm  TEMP: ( )  RESP:97 % Physical Exam  Ortho Exam Imaging: Xr C-arm No Report  Result Date: 12/07/2018 Please see Notes tab for imaging impression.

## 2018-12-29 ENCOUNTER — Ambulatory Visit: Payer: Medicare HMO

## 2019-01-05 ENCOUNTER — Other Ambulatory Visit: Payer: Self-pay

## 2019-01-05 ENCOUNTER — Ambulatory Visit (INDEPENDENT_AMBULATORY_CARE_PROVIDER_SITE_OTHER): Payer: Medicare HMO | Admitting: Family Medicine

## 2019-01-05 ENCOUNTER — Encounter: Payer: Self-pay | Admitting: Family Medicine

## 2019-01-05 VITALS — BP 135/70 | HR 76 | Temp 97.8°F | Ht 62.0 in | Wt 148.6 lb

## 2019-01-05 DIAGNOSIS — R7303 Prediabetes: Secondary | ICD-10-CM

## 2019-01-05 DIAGNOSIS — E7800 Pure hypercholesterolemia, unspecified: Secondary | ICD-10-CM | POA: Diagnosis not present

## 2019-01-05 DIAGNOSIS — M810 Age-related osteoporosis without current pathological fracture: Secondary | ICD-10-CM

## 2019-01-05 DIAGNOSIS — I5032 Chronic diastolic (congestive) heart failure: Secondary | ICD-10-CM | POA: Diagnosis not present

## 2019-01-05 DIAGNOSIS — Z1231 Encounter for screening mammogram for malignant neoplasm of breast: Secondary | ICD-10-CM | POA: Diagnosis not present

## 2019-01-05 DIAGNOSIS — I10 Essential (primary) hypertension: Secondary | ICD-10-CM | POA: Diagnosis not present

## 2019-01-05 DIAGNOSIS — Z0000 Encounter for general adult medical examination without abnormal findings: Secondary | ICD-10-CM | POA: Diagnosis not present

## 2019-01-05 LAB — LIPID PANEL
Cholesterol: 189 mg/dL (ref 0–200)
HDL: 60 mg/dL (ref >39.00)
LDL Cholesterol: 101 mg/dL — ABNORMAL HIGH (ref 0–99)
NonHDL: 128.69
Total CHOL/HDL Ratio: 3
Triglycerides: 138 mg/dL (ref 0.0–149.0)
VLDL: 27.6 mg/dL (ref 0.0–40.0)

## 2019-01-05 LAB — HEMOGLOBIN A1C: Hgb A1c MFr Bld: 5.9 % (ref 4.6–6.5)

## 2019-01-05 LAB — CBC WITH DIFFERENTIAL/PLATELET
Basophils Absolute: 0.1 10*3/uL (ref 0.0–0.1)
Basophils Relative: 1.1 % (ref 0.0–3.0)
Eosinophils Absolute: 0.1 10*3/uL (ref 0.0–0.7)
Eosinophils Relative: 2.1 % (ref 0.0–5.0)
HCT: 38.9 % (ref 36.0–46.0)
Hemoglobin: 12.5 g/dL (ref 12.0–15.0)
Lymphocytes Relative: 28.2 % (ref 12.0–46.0)
Lymphs Abs: 1.6 10*3/uL (ref 0.7–4.0)
MCHC: 32.1 g/dL (ref 30.0–36.0)
MCV: 89.7 fl (ref 78.0–100.0)
Monocytes Absolute: 0.6 10*3/uL (ref 0.1–1.0)
Monocytes Relative: 10.3 % (ref 3.0–12.0)
Neutro Abs: 3.3 10*3/uL (ref 1.4–7.7)
Neutrophils Relative %: 58.3 % (ref 43.0–77.0)
Platelets: 218 10*3/uL (ref 150.0–400.0)
RBC: 4.34 Mil/uL (ref 3.87–5.11)
RDW: 12.5 % (ref 11.5–15.5)
WBC: 5.7 10*3/uL (ref 4.0–10.5)

## 2019-01-05 LAB — VITAMIN D 25 HYDROXY (VIT D DEFICIENCY, FRACTURES): VITD: 45.64 ng/mL (ref 30.00–100.00)

## 2019-01-05 LAB — COMPREHENSIVE METABOLIC PANEL
ALT: 12 U/L (ref 0–35)
AST: 21 U/L (ref 0–37)
Albumin: 4.2 g/dL (ref 3.5–5.2)
Alkaline Phosphatase: 56 U/L (ref 39–117)
BUN: 15 mg/dL (ref 6–23)
CO2: 28 mEq/L (ref 19–32)
Calcium: 9.9 mg/dL (ref 8.4–10.5)
Chloride: 104 mEq/L (ref 96–112)
Creatinine, Ser: 0.84 mg/dL (ref 0.40–1.20)
GFR: 78.03 mL/min (ref >60.00)
Glucose, Bld: 119 mg/dL — ABNORMAL HIGH (ref 70–99)
Potassium: 3.9 mEq/L (ref 3.5–5.1)
Sodium: 140 mEq/L (ref 135–145)
Total Bilirubin: 0.7 mg/dL (ref 0.2–1.2)
Total Protein: 6.5 g/dL (ref 6.0–8.3)

## 2019-01-05 LAB — TSH: TSH: 1.14 u[IU]/mL (ref 0.35–4.50)

## 2019-01-05 NOTE — Assessment & Plan Note (Signed)
7/18 dexa -OP of forearm Intol of oral bisphosphonate -caused pain Declines dexa or other tx  No falls or fx D level is 46.4

## 2019-01-05 NOTE — Assessment & Plan Note (Signed)
Reviewed health habits including diet and exercise and skin cancer prevention Reviewed appropriate screening tests for age  Also reviewed health mt list, fam hx and immunization status , as well as social and family history   Neg cologuard test  Tetanus shot postponed for financial reasons  Declines dexa  Given materials to work on an advance directive  No cognitive concerns  Labs today

## 2019-01-05 NOTE — Assessment & Plan Note (Signed)
Disc goals for lipids and reasons to control them Rev last labs with pt Rev low sat fat diet in detail  Statin in tolerant  Lab today

## 2019-01-05 NOTE — Progress Notes (Signed)
Subjective:    Patient ID: Sara [name], female    DOB: 1931/09/07, 85 y.o.   MRN: [id number]  HPI Here for amw and annual health mt exam /rev of chronic medical problem   I have personally reviewed the Medicare Annual Wellness questionnaire and have noted 1. The patient's medical and social history 2. Their use of alcohol, tobacco or illicit drugs 3. Their current medications and supplements 4. The patient's functional ability including ADL's, fall risks, home safety risks and hearing or visual             impairment. 5. Diet and physical activities 6. Evidence for depression or mood disorders  The patients weight, height, BMI have been recorded in the chart and visual acuity is per eye clinic.  I have made referrals, counseling and provided education to the patient based review of the above and I have provided the pt with a written personalized care plan for preventive services. Reviewed and updated provider list, see scanned forms.  See scanned forms.  Routine anticipatory guidance given to patient.  See health maintenance. Colon cancer screening-out aged/ also neg cologuard test  Breast cancer screening mammogram 4/19 Self breast exam no lumps  Flu vaccine 9/19   Gets every fall  Tetanus vaccine 10/09  Pneumovax complete Zoster vaccine-had shingrix recently dexa  7/18 - osteoporosis of forearm  Intolerant of fosamax -caused bone pain  Not open to tx of any kind Declines dexa for now  Falls none Fractures none  Supplements D and Ca  Last vit D level 46.4  (due) Exercise  Some walking  Advance directive- she does not have  Cognitive function addressed- see scanned forms- and if abnormal then additional documentation follows.  No memory concerns  Still does her own finances /bills/ household issues-no problems   PMH and SH reviewed  Meds, vitals, and allergies reviewed.   ROS: See HPI.  Otherwise negative.    Weight  Wt Readings from Last 3 Encounters:   01/05/19 148 lb 9 oz (67.4 kg)  11/05/18 149 lb 0.5 oz (67.6 kg)  08/29/18 149 lb (67.6 kg)  mt -stable  Cannot do a lot of exercise  27.17 kg/m   Vision/hearing Hard of hearing- she needs to put them back in (they hurt her)  Putting off eye exam due to covid pandemic   She is seeing orthopedics for lumbar radiculopathy (disc herniation)  Had injection in June  Will have another Also in her knee  A lot of L groin pain    bp is stable today  H/o CHF  No cp or palpitations or headaches or edema  No side effects to medicines  BP Readings from Last 3 Encounters:  01/05/19 (!) 146/80  12/07/18 (!) 168/89  11/05/18 (!) 152/93   re check BP: 135/70    Lab Results  Component Value Date   CREATININE 0.87 07/20/2018   BUN 17 07/20/2018   NA 139 07/20/2018   K 3.7 07/20/2018   CL 102 07/20/2018   CO2 30 07/20/2018    Prediabetes Lab Results  Component Value Date   HGBA1C 6.2 12/18/2017   Due for a1c   last lipid Lab Results  Component Value Date   CHOL 189 01/05/2019   HDL 60.00 01/05/2019   LDLCALC 101 (H) 01/05/2019   LDLDIRECT 143.1 06/16/2013   TRIG 138.0 01/05/2019   CHOLHDL 3 01/05/2019  does not tolerate statin  H/o non obst cad  Patient Active Problem List  Diagnosis Date Noted  . Medicare annual wellness visit, subsequent 01/05/2019  . Encounter for screening mammogram for breast cancer 01/05/2019  . Knee pain, left 10/14/2018  . Arthritis of left hip 07/29/2018  . Bradycardia 07/01/2018  . Dyspnea 07/01/2018  . Rhinorrhea 01/05/2018  . Chronic diastolic heart failure ([place]) 12/27/2017  . Left low back pain 03/11/2017  . Diastolic dysfunction 77/93/9030  . Estrogen deficiency 12/16/2016  . Routine general medical examination at a health care facility 12/16/2016  . CAD (coronary artery disease) 12/04/2016  . Dyspepsia 03/15/2016  . 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 Eczema 03/06/2015  . Prediabetes 02/11/2014  . Intertrigo labialis 12/07/2012   . CONSTIPATION, CHRONIC 07/03/2010  . Osteoporosis 06/24/2008  . HYPERCHOLESTEROLEMIA 12/08/2006  . Essential hypertension 12/08/2006  . GERD 12/08/2006  . H/O herpes zoster 12/08/2006   Past Medical History:  Diagnosis Date  . Allergy   . CAD (coronary artery disease)   . Clinical trial participant    [facility] studies in familial dementia  . Dyspnea 11/28/2016  . Frequent UTI   . GERD (gastroesophageal reflux disease)   . Hypertension   . Osteoarthritis    hips, back  . Osteopenia   . Pneumonia   . Spinal stenosis   . Stroke [facility]) 1985   Past Surgical History:  Procedure Laterality Date  . ABDOMINAL HYSTERECTOMY     partial has one ovary  . APPENDECTOMY    . CATARACT EXTRACTION Bilateral   . LEFT HEART CATH AND CORONARY ANGIOGRAPHY N/A 11/29/2016   Procedure: Left Heart Cath and Coronary Angiography;  Surgeon: Sherren Mocha, MD;  Location: [facility];  Service: Cardiovascular;  Laterality: N/A;  . LUMBAR DISC SURGERY     x 3  . rotator cuff surgery Right 2010   Social History   Tobacco Use  . Smoking status: Never Smoker  . Smokeless tobacco: Never Used  Substance Use Topics  . Alcohol use: No    Alcohol/week: 0.0 standard drinks  . Drug use: No   Family History  Problem Relation Age of Onset  . Lung cancer Brother   . Other Mother        kidney tumor  . Hypertension Mother   . Diabetes Sister   . Diabetes Brother   . Diabetes Sister        x 2  . Colon cancer Maternal Uncle   . Other Sister        intestine burst  . Breast cancer Maternal Aunt        x 2  . Breast cancer Daughter   . Diabetes Son        x 3  . Diabetes Daughter   . Esophageal cancer Neg Hx   . Rectal cancer Neg Hx   . Stomach cancer Neg Hx    Allergies  Allergen Reactions  . Amoxicillin     Reaction not known  . Atorvastatin     aching  . Cefpodoxime Proxetil     Reaction not known  . Clarithromycin     Reaction not known  . Fosamax [Alendronate  Sodium]     Body pain   . Furosemide     REACTION: doesn't help  . Gabapentin Swelling    Caused severe swellling   Current Outpatient Medications on File Prior to Visit  Medication Sig Dispense Refill  . acetaminophen-codeine (TYLENOL #3) 300-30 MG tablet Take 1 tablet by mouth every 6 (six) hours as needed for moderate  pain or severe pain. 30 tablet 0  . aspirin EC 81 MG tablet Take 81 mg by mouth daily.      . Cholecalciferol (VITAMIN D-3) 1000 UNITS CAPS Take 1,000 Units by mouth daily.     Marland Kitchen diltiazem (CARDIZEM CD) 120 MG 24 hr capsule TAKE 1 CAPSULE (120 MG TOTAL) BY MOUTH DAILY. 90 capsule 1  . Docusate Sodium (EQ STOOL SOFTENER PO) Take 100 mg by mouth daily as needed (constipation).     Marland Kitchen doxylamine, Sleep, (EQ SLEEP AID) 25 MG tablet Take 25 mg by mouth at bedtime as needed for sleep.     . hydrochlorothiazide (HYDRODIURIL) 25 MG tablet TAKE 1 TABLET (25 MG TOTAL) BY MOUTH DAILY. 90 tablet 1  . nitroGLYCERIN (NITROSTAT) 0.4 MG SL tablet Place 1 tablet (0.4 mg total) under the tongue every 5 (five) minutes as needed for chest pain (max 3 doses in 15 minutes). 25 tablet 3  . polyethylene glycol (MIRALAX / GLYCOLAX) packet Take 17 g by mouth daily.     . potassium chloride (K-DUR) 10 MEQ tablet TAKE 1 TABLET (10 MEQ TOTAL) BY MOUTH DAILY. 90 tablet 1  . traMADol (ULTRAM) 50 MG tablet Take 1 tablet (50 mg total) by mouth 2 (two) times daily as needed for severe pain. 30 tablet 0  . traMADol (ULTRAM) 50 MG tablet Take 1 tablet (50 mg total) by mouth every 6 (six) hours as needed. 30 tablet 0  . amitriptyline (ELAVIL) 10 MG tablet Take 1 tablet (10 mg total) by mouth at bedtime. (Patient not taking: Reported on 01/05/2019) 30 tablet 1  . diazepam (VALIUM) 5 MG tablet Take 1 tablet (5 mg total) by mouth every 12 (twelve) hours as needed for muscle spasms. (Patient not taking: Reported on 01/05/2019) 10 tablet 0   No current facility-administered medications on file prior to visit.       Review of Systems  Constitutional: Negative for activity change, appetite change, fatigue, fever and unexpected weight change.  HENT: Positive for hearing loss. Negative for congestion, ear pain, rhinorrhea, sinus pressure and sore throat.   Eyes: Negative for pain, redness and visual disturbance.  Respiratory: Negative for cough, shortness of breath and wheezing.   Cardiovascular: Negative for chest pain and palpitations.  Gastrointestinal: Negative for abdominal pain, blood in stool, constipation and diarrhea.  Endocrine: Negative for polydipsia and polyuria.  Genitourinary: Negative for dysuria, frequency and urgency.  Musculoskeletal: Positive for arthralgias, back pain and gait problem. Negative for myalgias.  Skin: Negative for pallor and rash.  Allergic/Immunologic: Negative for environmental allergies.  Neurological: Negative for dizziness, syncope and headaches.  Hematological: Negative for adenopathy. Does not bruise/bleed easily.  Psychiatric/Behavioral: Negative for decreased concentration and dysphoric mood. The patient is not nervous/anxious.        Objective:   Physical Exam Constitutional:      General: She is not in acute distress.    Appearance: Normal appearance. She is well-developed and normal weight. She is not ill-appearing or diaphoretic.  HENT:     Head: Normocephalic and atraumatic.     Right Ear: Tympanic membrane, ear canal and external ear normal. There is no impacted cerumen.     Left Ear: Tympanic membrane, ear canal and external ear normal. There is no impacted cerumen.     Ears:     Comments: Not wearing her hearing aides today    Nose: Nose normal.     Mouth/Throat:     Mouth: Mucous membranes are moist.  Pharynx: Oropharynx is clear.  Eyes:     General: No scleral icterus.    Conjunctiva/sclera: Conjunctivae normal.     Pupils: Pupils are equal, round, and reactive to light.  Neck:     Musculoskeletal: Normal range of motion and neck  supple. No neck rigidity or muscular tenderness.     Thyroid: No thyromegaly.     Vascular: No carotid bruit or JVD.  Cardiovascular:     Rate and Rhythm: Normal rate and regular rhythm.     Pulses: Normal pulses.     Heart sounds: Normal heart sounds. No murmur. No friction rub. No gallop.   Pulmonary:     Effort: Pulmonary effort is normal. No respiratory distress.     Breath sounds: Normal breath sounds. No wheezing or rales.  Chest:     Chest wall: No tenderness.  Abdominal:     General: Bowel sounds are normal. There is no distension or abdominal bruit.     Palpations: Abdomen is soft. There is no mass.     Tenderness: There is no abdominal tenderness.  Genitourinary:    Comments: Breast exam: No mass, nodules, thickening, tenderness, bulging, retraction, inflamation, nipple discharge or skin changes noted.  No axillary or clavicular LA.     Musculoskeletal: Normal range of motion.        General: No tenderness.     Right lower leg: No edema.     Left lower leg: No edema.     Comments: Limited rom of spine Slow gait from back pain  Lymphadenopathy:     Cervical: No cervical adenopathy.  Skin:    General: Skin is warm and dry.     Coloration: Skin is not pale.     Findings: No bruising, erythema, lesion or rash.     Comments: Some sks  Neurological:     Mental Status: She is alert.     Cranial Nerves: No cranial nerve deficit.     Motor: No abnormal muscle tone.     Coordination: Coordination normal.     Gait: Gait normal.     Deep Tendon Reflexes: Reflexes are normal and symmetric. Reflexes normal.  Psychiatric:        Mood and Affect: Mood normal.        Cognition and Memory: Cognition and memory normal.     Comments: Mentally sharp            Assessment & Plan:   Problem List Items Addressed This Visit      Cardiovascular and Mediastinum   Essential hypertension    bp in fair control at this time  BP Readings from Last 1 Encounters:  01/05/19 135/70    No changes needed Most recent labs reviewed  Disc lifstyle change with low sodium diet and exercise  Labs today      Relevant Orders   CBC with Differential/Platelet (Completed)   Comprehensive metabolic panel (Completed)   Lipid panel (Completed)   TSH (Completed)   Chronic diastolic heart failure ([place])    No clinical changes/doing well        Musculoskeletal and Integument   Osteoporosis    7/18 dexa -OP of forearm Intol of oral bisphosphonate -caused pain Declines dexa or other tx  No falls or fx D level is 46.4      Relevant Orders   VITAMIN D 25 Hydroxy (Vit-D Deficiency, Fractures) (Completed)     Other   HYPERCHOLESTEROLEMIA    Disc goals for lipids and reasons  to control them Rev last labs with pt Rev low sat fat diet in detail  Statin in tolerant  Lab today       Relevant Orders   Lipid panel (Completed)   Prediabetes    Due for A1C She is mindful of sugar in diet disc imp of low glycemic diet and wt loss to prevent DM2       Relevant Orders   Hemoglobin A1c (Completed)   Routine general medical examination at a health care facility    Reviewed health habits including diet and exercise and skin cancer prevention Reviewed appropriate screening tests for age  Also reviewed health mt list, fam hx and immunization status , as well as social and family history   Neg cologuard test  Tetanus shot postponed for financial reasons  Declines dexa  Given materials to work on an Forensic scientist  No cognitive concerns  Labs today       Medicare annual wellness visit, subsequent - Primary    Reviewed health habits including diet and exercise and skin cancer prevention Reviewed appropriate screening tests for age  Also reviewed health mt list, fam hx and immunization status , as well as social and family history   Neg cologuard test  Tetanus shot postponed for financial reasons  Declines dexa  Given materials to work on an Forensic scientist  No cognitive  concerns  Labs today         Encounter for screening mammogram for breast cancer    Referral done for mammogram  Pt will schedule her appt      Relevant Orders   MM 3D SCREEN BREAST BILATERAL

## 2019-01-05 NOTE — Assessment & Plan Note (Signed)
bp in fair control at this time  BP Readings from Last 1 Encounters:  01/05/19 135/70   No changes needed Most recent labs reviewed  Disc lifstyle change with low sodium diet and exercise  Labs today

## 2019-01-05 NOTE — Assessment & Plan Note (Signed)
Due for A1C She is mindful of sugar in diet disc imp of low glycemic diet and wt loss to prevent DM2

## 2019-01-05 NOTE — Assessment & Plan Note (Signed)
No clinical changes -doing well  

## 2019-01-05 NOTE — Patient Instructions (Addendum)
Get a flu shot in the fall- sept or later  Call and schedule your mammogram at [facility]   Please work on advance directive (living will and power of attorney) It is the blue packet  Fill it out  Get it notarized  We can scan a copy when it is done   Your hearing aides have nothing to do with your back problem   Labs today  Take care of yourself and stay safe

## 2019-01-05 NOTE — Assessment & Plan Note (Signed)
Referral done for mammogram  Pt will schedule her appt

## 2019-01-06 ENCOUNTER — Encounter: Payer: Self-pay | Admitting: *Deleted

## 2019-01-21 ENCOUNTER — Encounter: Payer: Self-pay | Admitting: Specialist

## 2019-01-21 ENCOUNTER — Ambulatory Visit (INDEPENDENT_AMBULATORY_CARE_PROVIDER_SITE_OTHER): Payer: Medicare HMO | Admitting: Specialist

## 2019-01-21 VITALS — BP 163/85 | HR 57 | Ht 62.0 in | Wt 149.0 lb

## 2019-01-21 DIAGNOSIS — M48062 Spinal stenosis, lumbar region with neurogenic claudication: Secondary | ICD-10-CM | POA: Diagnosis not present

## 2019-01-21 DIAGNOSIS — M5116 Intervertebral disc disorders with radiculopathy, lumbar region: Secondary | ICD-10-CM

## 2019-01-21 DIAGNOSIS — M4326 Fusion of spine, lumbar region: Secondary | ICD-10-CM

## 2019-01-21 DIAGNOSIS — M5416 Radiculopathy, lumbar region: Secondary | ICD-10-CM | POA: Diagnosis not present

## 2019-01-21 NOTE — Patient Instructions (Signed)
Avoid bending, stooping and avoid lifting weights greater than 10 lbs. Avoid prolong standing and walking. Order for a new walker with wheels. Surgery scheduling secretary Kandice Hams, will call you in the next week to schedule for surgery.  Surgery recommended is a two level lumbar decompressive laminectomy with discectomy L2-3 and L3-4 this would be done with a microscope. Take hydrocodone for for pain. Risk of surgery includes risk of infection 1 in 200 patients, bleeding 1/2% chance you would need a transfusion.   Risk to the nerves is one in 10,000.  Expect improved walking and standing tolerance. Expect relief of leg pain but numbness may persist depending on the length and degree of pressure that has been present.

## 2019-01-21 NOTE — Progress Notes (Addendum)
Office Visit Note   Patient: Sara [name]           Date of Birth: Jan 16, 1932           MRN: [id number] Visit Date: 01/21/2019              Requested by: Tower, Wynelle Fanny, MD [place],  [place] [address] PCP: Abner Greenspan, MD   Assessment & Plan: Visit Diagnoses:  1. Herniation of lumbar intervertebral disc with radiculopathy   2. Fusion of spine of lumbar region   3. Spinal stenosis of lumbar region with neurogenic claudication   4. Lumbar radiculopathy     Plan: Avoid bending, stooping and avoid lifting weights greater than 10 lbs. Avoid prolong standing and walking. Order for a new walker with wheels. Surgery scheduling secretary Kandice Hams, will call you in the next week to schedule for surgery.  Surgery recommended is a two level lumbar decompressive laminectomy with discectomy L2-3 and L3-4 this would be done with a microscope. Take hydrocodone for for pain. Risk of surgery includes risk of infection 1 in 200 patients, bleeding 1/2% chance you would need a transfusion.   Risk to the nerves is one in 10,000.  Expect improved walking and standing tolerance. Expect relief of leg pain but numbness may persist depending on the length and degree of pressure that has been present.  Follow-Up Instructions: No follow-ups on file.   Orders:  No orders of the defined types were placed in this encounter.  No orders of the defined types were placed in this encounter.     Procedures: No procedures performed   Clinical Data: Findings:  CLINICAL DATA:  Lower back pain greater on the left where there is extension to the leg  EXAM: MRI LUMBAR SPINE WITHOUT CONTRAST  TECHNIQUE: Multiplanar, multisequence MR imaging of the lumbar spine was performed. No intravenous contrast was administered.  COMPARISON:  Lumbar myelogram 11/30/2009  FINDINGS: Segmentation:  Standard lumbar numbering  Alignment: Fused grade 1 anterolisthesis at  L4-5. Slight retrolisthesis at L3-4. There is up to 2/3 mm of retrolisthesis at L2-3 and L1-2.  Vertebrae:  No fracture, evidence of discitis, or bone lesion.  Conus medullaris and cauda equina: Conus extends to the L1 level. Conus and cauda equina appear normal.  Paraspinal and other soft tissues: Negative  Disc levels:  T12- L1: Spondylosis with a small left paracentral protrusion. No impingement  L1-L2: Spondylosis with disc narrowing and bulging.  No impingement  L2-L3: Disc narrowing and bulging with a left paracentral inferiorly migrating disc herniation. Posterior element hypertrophy. Spinal stenosis is moderate and progressive. There is complete effacement the left subarticular recess impinging on the L3 nerve root. High-grade left foraminal impingement. Moderate to advanced right foraminal narrowing  L3-L4: Disc narrowing and bulging with posterior element hypertrophy. Patulous spinal canal this level. There is a small left paracentral protrusion but is noncompressive. Moderate left and mild-to-moderate right foraminal narrowing  L4-L5: PLIF.  No impingement  L5-S1:Degenerative facet spurring. Disc narrowing and bulging. No impingement  IMPRESSION: 1. Multilevel degenerative disease with listhesis that has progressed from 2011 myelogram. 2. L2-3 high-grade spinal stenosis. There is asymmetric left subarticular recess impingement due to a paracentral protrusion. Bilateral foraminal impingement at this level, more advanced on the left. 3. L3-4 moderate bilateral foraminal stenosis. 4. L4-5 PLIF with solid posterolateral arthrodesis.  No impingement.   Electronically Signed   By: Monte Fantasia M.D.   On: 10/30/2018 05:41  Subjective: Chief Complaint  Patient presents with  . Lower Back - Follow-up, Pain    85 year old female with persistent bilateral thigh pain and left knee and anterior thigh pain, underwent attempts at [facility] without  benefit and pain is severe and has difficutly with trying to sleep. No bowel or bladder difficullty. She is on a fluid pill for blood pressure. Difficulty with standing or  Walking with pain. Sitting helps but it is painful to sit too long. Pain in the back and radiates down the left thigh. MRI with L2-3 and L3-4 HNP with left lateral recess stenosis and foramenal stenosis  Affecting the left L3 and L4 nerve roots.   Review of Systems  Constitutional: Negative.   HENT: Negative.   Eyes: Negative.   Respiratory: Negative.   Cardiovascular: Negative.   Gastrointestinal: Negative.   Endocrine: Negative.   Genitourinary: Negative.   Musculoskeletal: Negative.   Skin: Negative.   Allergic/Immunologic: Negative.   Neurological: Negative.   Hematological: Negative.   Psychiatric/Behavioral: Negative.      Objective: Vital Signs: BP (!) 163/85   Pulse (!) 57   Ht 5\' 2"  (1.575 m)   Wt 149 lb (67.6 kg)   BMI 27.25 kg/m   Physical Exam Constitutional:      Appearance: She is well-developed.  HENT:     Head: Normocephalic and atraumatic.  Eyes:     Pupils: Pupils are equal, round, and reactive to light.  Neck:     Musculoskeletal: Normal range of motion and neck supple.  Pulmonary:     Effort: Pulmonary effort is normal.     Breath sounds: Normal breath sounds.  Abdominal:     General: Bowel sounds are normal.     Palpations: Abdomen is soft.  Skin:    General: Skin is warm and dry.  Neurological:     Mental Status: She is alert and oriented to person, place, and time.  Psychiatric:        Behavior: Behavior normal.        Thought Content: Thought content normal.        Judgment: Judgment normal.     Back Exam   Tenderness  The patient is experiencing tenderness in the lumbar.  Range of Motion  Extension: abnormal  Lateral bend right: abnormal  Lateral bend left: abnormal  Rotation right: abnormal  Rotation left: abnormal   Muscle Strength  Right Quadriceps:   5/5  Left Quadriceps:  4/5  Right Hamstrings:  5/5  Left Hamstrings:  5/5   Tests  Straight leg raise right: negative Straight leg raise left: negative  Reflexes  Patellar: abnormal Achilles: normal Biceps: normal  Other  Toe walk: normal Heel walk: abnormal Sensation: decreased Gait: normal  Erythema: no back redness Scars: absent  Comments:  Weakness left quadriceps 4/5, weakness left foot DF 5-/5.       Specialty Comments:  No specialty comments available.  Imaging: No results found.   PMFS History: Patient Active Problem List   Diagnosis Date Noted  . Medicare annual wellness visit, subsequent 01/05/2019  . Encounter for screening mammogram for breast cancer 01/05/2019  . Knee pain, left 10/14/2018  . Arthritis of left hip 07/29/2018  . Bradycardia 07/01/2018  . Dyspnea 07/01/2018  . Rhinorrhea 01/05/2018  . Chronic diastolic heart failure ([place]) 12/27/2017  . Left low back pain 03/11/2017  . Diastolic dysfunction 96/78/9381  . Estrogen deficiency 12/16/2016  . Routine general medical examination at a health care facility 12/16/2016  .  CAD (coronary artery disease) 12/04/2016  . Dyspepsia 03/15/2016  . 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 Eczema 03/06/2015  . Prediabetes 02/11/2014  . Intertrigo labialis 12/07/2012  . CONSTIPATION, CHRONIC 07/03/2010  . Osteoporosis 06/24/2008  . HYPERCHOLESTEROLEMIA 12/08/2006  . Essential hypertension 12/08/2006  . GERD 12/08/2006  . H/O herpes zoster 12/08/2006   Past Medical History:  Diagnosis Date  . Allergy   . CAD (coronary artery disease)   . Clinical trial participant    [facility] studies in familial dementia  . Dyspnea 11/28/2016  . Frequent UTI   . GERD (gastroesophageal reflux disease)   . Hypertension   . Osteoarthritis    hips, back  . Osteopenia   . Pneumonia   . Spinal stenosis   . Stroke [facility]) 1985    Family History  Problem Relation Age of Onset  . Lung cancer Brother   .  Other Mother        kidney tumor  . Hypertension Mother   . Diabetes Sister   . Diabetes Brother   . Diabetes Sister        x 2  . Colon cancer Maternal Uncle   . Other Sister        intestine burst  . Breast cancer Maternal Aunt        x 2  . Breast cancer Daughter   . Diabetes Son        x 3  . Diabetes Daughter   . Esophageal cancer Neg Hx   . Rectal cancer Neg Hx   . Stomach cancer Neg Hx     Past Surgical History:  Procedure Laterality Date  . ABDOMINAL HYSTERECTOMY     partial has one ovary  . APPENDECTOMY    . CATARACT EXTRACTION Bilateral   . LEFT HEART CATH AND CORONARY ANGIOGRAPHY N/A 11/29/2016   Procedure: Left Heart Cath and Coronary Angiography;  Surgeon: Sherren Mocha, MD;  Location: [place] [facility];  Service: Cardiovascular;  Laterality: N/A;  . LUMBAR DISC SURGERY     x 3  . rotator cuff surgery Right 2010   Social History   Occupational History  . Occupation: RETIRED    Employer: RETIRED  Tobacco Use  . Smoking status: Never Smoker  . Smokeless tobacco: Never Used  Substance and Sexual Activity  . Alcohol use: No    Alcohol/week: 0.0 standard drinks  . Drug use: No  . Sexual activity: Not on file

## 2019-02-02 ENCOUNTER — Encounter: Payer: Self-pay | Admitting: Family Medicine

## 2019-02-02 ENCOUNTER — Ambulatory Visit: Payer: Medicare HMO | Admitting: Cardiovascular Disease

## 2019-02-02 DIAGNOSIS — Z1231 Encounter for screening mammogram for malignant neoplasm of breast: Secondary | ICD-10-CM | POA: Diagnosis not present

## 2019-02-02 DIAGNOSIS — Z803 Family history of malignant neoplasm of breast: Secondary | ICD-10-CM | POA: Diagnosis not present

## 2019-02-05 ENCOUNTER — Encounter: Payer: Self-pay | Admitting: Family Medicine

## 2019-02-05 DIAGNOSIS — R922 Inconclusive mammogram: Secondary | ICD-10-CM | POA: Diagnosis not present

## 2019-02-05 DIAGNOSIS — N6315 Unspecified lump in the right breast, overlapping quadrants: Secondary | ICD-10-CM | POA: Diagnosis not present

## 2019-02-08 ENCOUNTER — Ambulatory Visit: Payer: Medicare HMO | Admitting: Cardiology

## 2019-02-10 ENCOUNTER — Telehealth: Payer: Self-pay | Admitting: *Deleted

## 2019-02-10 ENCOUNTER — Encounter: Payer: Self-pay | Admitting: Family Medicine

## 2019-02-10 ENCOUNTER — Telehealth: Payer: Self-pay

## 2019-02-10 ENCOUNTER — Other Ambulatory Visit: Payer: Self-pay | Admitting: Radiology

## 2019-02-10 DIAGNOSIS — N6315 Unspecified lump in the right breast, overlapping quadrants: Secondary | ICD-10-CM | POA: Diagnosis not present

## 2019-02-10 DIAGNOSIS — C50811 Malignant neoplasm of overlapping sites of right female breast: Secondary | ICD-10-CM | POA: Diagnosis not present

## 2019-02-10 NOTE — Telephone Encounter (Signed)
Patient left voice mail wanting to schedule surgery.  I called her back and no answer.  **Need surgery sheet please.**

## 2019-02-10 NOTE — Telephone Encounter (Signed)
   [facility] Pre-operative Risk Assessment    Request for surgical clearance:  1. What type of surgery is being performed? LUMBAR LAMINECTOMY/MICRODISCECTOMY   2. When is this surgery scheduled? TBD    3. What type of clearance is required (medical clearance vs. Pharmacy clearance to hold med vs. Both)? MEDICAL   4. Are there any medications that need to be held prior to surgery and how long? ASA    5. Practice name and name of physician performing surgery? [facility]; DR. Jeneen Rinks NITKA   6. What is your office phone number [phone]    7.   What is your office fax number [phone]  8.   Anesthesia type (None, local, MAC, general) ?  NOT LISTED. LEFT MESSAGE WITH SURGEON'S OFFICE.   Julaine Hua 02/10/2019, 4:42 PM  _________________________________________________________________   (provider comments below)

## 2019-02-12 ENCOUNTER — Telehealth: Payer: Self-pay | Admitting: Specialist

## 2019-02-12 ENCOUNTER — Telehealth: Payer: Self-pay | Admitting: *Deleted

## 2019-02-12 NOTE — Telephone Encounter (Signed)
Patient's daughter Pamala Hurry called advised her mother just found out she has cancer and need wait on the back surgery per Dr Bayard Hugger. The doctor wan to address the cancer first. The number to contact Pamala Hurry is [phone]

## 2019-02-12 NOTE — Telephone Encounter (Signed)
I advised Dr.NItka of this and he feels she needs to get this addressed first

## 2019-02-12 NOTE — Telephone Encounter (Signed)
Dr. Glori Bickers placed a note on pt's surgical clearance form saying: "needs appt. She may also have to see her cardiologist Dr. Marlou Porch"  Called pt to schedule appt and she said that she just found out she has breast cancer and needs to put the surgical clearance on hold because she isn't getting back surgery until they make a plan regarding breast cancer diagnosis. (see Ortho phone note from today also). Pt said she will call back to schedule surgical clearance appt if she decides to proceed with back surgery in the future

## 2019-02-16 NOTE — Telephone Encounter (Signed)
   Primary Cardiologist:Mark Marlou Porch, MD  Chart reviewed as part of pre-operative protocol coverage. Because of Cyndal V Blanchet's past medical history and time since last visit, he/she will require a follow-up visit in order to better assess preoperative cardiovascular risk.  Pre-op covering staff: - Please schedule appointment and call patient to inform them. - Please contact requesting surgeon's office via preferred method (i.e, phone, fax) to inform them of need for appointment prior to surgery.  If applicable, this message will also be routed to pharmacy pool and/or primary cardiologist for input on holding anticoagulant/antiplatelet agent as requested below so that this information is available at time of patient's appointment.   [place], [place]  02/16/2019, 1:17 PM

## 2019-02-17 NOTE — Telephone Encounter (Signed)
Patient is already scheduled to see De. Skains on 02/23/19 at 11:40AM

## 2019-02-18 ENCOUNTER — Ambulatory Visit: Payer: Self-pay | Admitting: General Surgery

## 2019-02-18 DIAGNOSIS — Z170 Estrogen receptor positive status [ER+]: Secondary | ICD-10-CM | POA: Diagnosis not present

## 2019-02-18 DIAGNOSIS — C50411 Malignant neoplasm of upper-outer quadrant of right female breast: Secondary | ICD-10-CM

## 2019-02-19 ENCOUNTER — Encounter: Payer: Self-pay | Admitting: Family Medicine

## 2019-02-19 ENCOUNTER — Other Ambulatory Visit: Payer: Self-pay

## 2019-02-19 ENCOUNTER — Ambulatory Visit (INDEPENDENT_AMBULATORY_CARE_PROVIDER_SITE_OTHER): Payer: Medicare HMO | Admitting: Family Medicine

## 2019-02-19 VITALS — BP 138/65 | HR 60 | Temp 97.6°F | Ht 62.0 in | Wt 146.3 lb

## 2019-02-19 DIAGNOSIS — R7303 Prediabetes: Secondary | ICD-10-CM | POA: Diagnosis not present

## 2019-02-19 DIAGNOSIS — M48061 Spinal stenosis, lumbar region without neurogenic claudication: Secondary | ICD-10-CM | POA: Diagnosis not present

## 2019-02-19 DIAGNOSIS — C50911 Malignant neoplasm of unspecified site of right female breast: Secondary | ICD-10-CM

## 2019-02-19 DIAGNOSIS — Z23 Encounter for immunization: Secondary | ICD-10-CM | POA: Diagnosis not present

## 2019-02-19 DIAGNOSIS — Z01818 Encounter for other preprocedural examination: Secondary | ICD-10-CM

## 2019-02-19 DIAGNOSIS — Z170 Estrogen receptor positive status [ER+]: Secondary | ICD-10-CM | POA: Diagnosis not present

## 2019-02-19 DIAGNOSIS — I2510 Atherosclerotic heart disease of native coronary artery without angina pectoris: Secondary | ICD-10-CM

## 2019-02-19 DIAGNOSIS — I10 Essential (primary) hypertension: Secondary | ICD-10-CM | POA: Diagnosis not present

## 2019-02-19 NOTE — Progress Notes (Signed)
Subjective:    Patient ID: Sara [name], female    DOB: 02-13-1932, 85 y.o.   MRN: [id number]  HPI Here for f/u to discuss upcoming surgery and also breast cancer  Waiting for a call to schedule surgery Saw Dr Marlou Starks  Planning R lumpectomy and then medication (thinks LN are ok)  May need anti est therapy  Not sure if she will need chemo   Sister died with breast cancer -she is familiar with tx from fam hx   Wt Readings from Last 3 Encounters:  02/19/19 146 lb 5 oz (66.4 kg)  01/21/19 149 lb (67.6 kg)  01/05/19 148 lb 9 oz (67.4 kg)   26.76 kg/m   Was planning a lumbar laminectomy and microdiscectomy with Dr Louanne Skye  Has lumbar disc herniation with  Radiculopathy/ spinal stenosis with neurogenic claudication   Recommended 2 level lumbar decompressive laminectomy with discectomy at L2-3 and L3-4  Pain in L lat leg and knee-has improved a bit   Recent diagnosis of breast cancer unfortunately   Allergies: no h/o anaphylaxis with anything  No problems with pain medicines No trouble from anesthesia - has been nauseated mildly   Taking tramadol for pain    bp is stable today  Better on 2nd check BP: 138/65   No cp or palpitations or headaches or edema  No side effects to medicines  Pain makes bp higher  BP Readings from Last 3 Encounters:  02/19/19 (!) 148/78  01/21/19 (!) 163/85  01/05/19 135/70     Lab Results  Component Value Date   CREATININE 0.84 01/05/2019   BUN 15 01/05/2019   NA 140 01/05/2019   K 3.9 01/05/2019   CL 104 01/05/2019   CO2 28 01/05/2019   Prediabetes Lab Results  Component Value Date   HGBA1C 5.9 01/05/2019   Well controlled with diet   Has appt upcoming for cardiac clearance- has CAD and h/o CVA appt is 9/15 with Dr Marlou Porch No symptoms at all from heart  Cannot tolerate statin   Patient Active Problem List   Diagnosis Date Noted  . Medicare annual wellness visit, subsequent 01/05/2019  . Encounter for screening mammogram  for breast cancer 01/05/2019  . Knee pain, left 10/14/2018  . Arthritis of left hip 07/29/2018  . Bradycardia 07/01/2018  . Dyspnea 07/01/2018  . Rhinorrhea 01/05/2018  . Chronic diastolic heart failure ([place]) 12/27/2017  . Left low back pain 03/11/2017  . Diastolic dysfunction [id number]  . Estrogen deficiency 12/16/2016  . Routine general medical examination at a health care facility 12/16/2016  . CAD (coronary artery disease) 12/04/2016  . Dyspepsia 03/15/2016  . 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 Eczema 03/06/2015  . Prediabetes 02/11/2014  . Intertrigo labialis 12/07/2012  . CONSTIPATION, CHRONIC 07/03/2010  . Osteoporosis 06/24/2008  . HYPERCHOLESTEROLEMIA 12/08/2006  . Essential hypertension 12/08/2006  . GERD 12/08/2006  . H/O herpes zoster 12/08/2006   Past Medical History:  Diagnosis Date  . Allergy   . CAD (coronary artery disease)   . Clinical trial participant    [facility] studies in familial dementia  . Dyspnea 11/28/2016  . Frequent UTI   . GERD (gastroesophageal reflux disease)   . Hypertension   . Osteoarthritis    hips, back  . Osteopenia   . Pneumonia   . Spinal stenosis   . Stroke [facility]) 1985   Past Surgical History:  Procedure Laterality Date  . ABDOMINAL HYSTERECTOMY     partial has one ovary  .  APPENDECTOMY    . CATARACT EXTRACTION Bilateral   . LEFT HEART CATH AND CORONARY ANGIOGRAPHY N/A 11/29/2016   Procedure: Left Heart Cath and Coronary Angiography;  Surgeon: Sherren Mocha, MD;  Location: [facility];  Service: Cardiovascular;  Laterality: N/A;  . LUMBAR DISC SURGERY     x 3  . rotator cuff surgery Right 2010   Social History   Tobacco Use  . Smoking status: Never Smoker  . Smokeless tobacco: Never Used  Substance Use Topics  . Alcohol use: No    Alcohol/week: 0.0 standard drinks  . Drug use: No   Family History  Problem Relation Age of Onset  . Lung cancer Brother   . Other Mother        kidney tumor  .  Hypertension Mother   . Diabetes Sister   . Diabetes Brother   . Diabetes Sister        x 2  . Colon cancer Maternal Uncle   . Other Sister        intestine burst  . Breast cancer Maternal Aunt        x 2  . Breast cancer Daughter   . Diabetes Son        x 3  . Diabetes Daughter   . Esophageal cancer Neg Hx   . Rectal cancer Neg Hx   . Stomach cancer Neg Hx    Allergies  Allergen Reactions  . Amoxicillin     Reaction not known  . Atorvastatin     aching  . Cefpodoxime Proxetil     Reaction not known  . Clarithromycin     Reaction not known  . Fosamax [Alendronate Sodium]     Body pain   . Furosemide     REACTION: doesn't help  . Gabapentin Swelling    Caused severe swellling   Current Outpatient Medications on File Prior to Visit  Medication Sig Dispense Refill  . acetaminophen-codeine (TYLENOL #3) 300-30 MG tablet Take 1 tablet by mouth every 6 (six) hours as needed for moderate pain or severe pain. 30 tablet 0  . amitriptyline (ELAVIL) 10 MG tablet Take 1 tablet (10 mg total) by mouth at bedtime. 30 tablet 1  . aspirin EC 81 MG tablet Take 81 mg by mouth daily.      . Cholecalciferol (VITAMIN D-3) 1000 UNITS CAPS Take 1,000 Units by mouth daily.     . diazepam (VALIUM) 5 MG tablet Take 1 tablet (5 mg total) by mouth every 12 (twelve) hours as needed for muscle spasms. 10 tablet 0  . diltiazem (CARDIZEM CD) 120 MG 24 hr capsule TAKE 1 CAPSULE (120 MG TOTAL) BY MOUTH DAILY. 90 capsule 1  . Docusate Sodium (EQ STOOL SOFTENER PO) Take 100 mg by mouth daily as needed (constipation).     Marland Kitchen doxylamine, Sleep, (EQ SLEEP AID) 25 MG tablet Take 25 mg by mouth at bedtime as needed for sleep.     . hydrochlorothiazide (HYDRODIURIL) 25 MG tablet TAKE 1 TABLET (25 MG TOTAL) BY MOUTH DAILY. 90 tablet 1  . nitroGLYCERIN (NITROSTAT) 0.4 MG SL tablet Place 1 tablet (0.4 mg total) under the tongue every 5 (five) minutes as needed for chest pain (max 3 doses in 15 minutes). 25 tablet 3   . polyethylene glycol (MIRALAX / GLYCOLAX) packet Take 17 g by mouth daily.     . potassium chloride (K-DUR) 10 MEQ tablet TAKE 1 TABLET (10 MEQ TOTAL) BY MOUTH DAILY. 90 tablet  1  . traMADol (ULTRAM) 50 MG tablet Take 1 tablet (50 mg total) by mouth every 6 (six) hours as needed. 30 tablet 0   No current facility-administered medications on file prior to visit.     Review of Systems  Constitutional: Positive for fatigue. Negative for activity change, appetite change, fever and unexpected weight change.  HENT: Negative for congestion, ear pain, rhinorrhea, sinus pressure and sore throat.   Eyes: Negative for pain, redness and visual disturbance.  Respiratory: Negative for cough, shortness of breath and wheezing.   Cardiovascular: Negative for chest pain and palpitations.  Gastrointestinal: Negative for abdominal pain, blood in stool, constipation and diarrhea.  Endocrine: Negative for polydipsia and polyuria.  Genitourinary: Negative for dysuria, frequency and urgency.  Musculoskeletal: Positive for back pain and gait problem. Negative for arthralgias and myalgias.  Skin: Negative for pallor and rash.  Allergic/Immunologic: Negative for environmental allergies.  Neurological: Negative for dizziness, syncope and headaches.  Hematological: Negative for adenopathy. Does not bruise/bleed easily.  Psychiatric/Behavioral: Negative for decreased concentration and dysphoric mood. The patient is not nervous/anxious.        Objective:   Physical Exam Constitutional:      General: She is not in acute distress.    Appearance: Normal appearance. She is well-developed and normal weight. She is not ill-appearing.  HENT:     Head: Normocephalic and atraumatic.  Eyes:     Conjunctiva/sclera: Conjunctivae normal.     Pupils: Pupils are equal, round, and reactive to light.  Neck:     Musculoskeletal: Normal range of motion and neck supple.     Thyroid: No thyromegaly.     Vascular: No carotid  bruit or JVD.  Cardiovascular:     Rate and Rhythm: Normal rate and regular rhythm.     Heart sounds: Normal heart sounds. No gallop.   Pulmonary:     Effort: Pulmonary effort is normal. No respiratory distress.     Breath sounds: Normal breath sounds. No wheezing or rales.  Abdominal:     General: Bowel sounds are normal. There is no distension or abdominal bruit.     Palpations: Abdomen is soft. There is no mass.     Tenderness: There is no abdominal tenderness.  Musculoskeletal:        General: Tenderness present.     Right lower leg: No edema.     Left lower leg: No edema.     Comments: Limited rom of LS  In pain   Lymphadenopathy:     Cervical: No cervical adenopathy.  Skin:    General: Skin is warm and dry.     Findings: No rash.  Neurological:     Mental Status: She is alert.     Motor: No weakness.     Coordination: Coordination normal.     Deep Tendon Reflexes: Reflexes are normal and symmetric. Reflexes normal.  Psychiatric:        Mood and Affect: Mood normal.           Assessment & Plan:   Problem List Items Addressed This Visit      Cardiovascular and Mediastinum   Essential hypertension    bp in fair control at this time  BP Readings from Last 1 Encounters:  02/19/19 138/65   No changes needed Most recent labs reviewed  Disc lifstyle change with low sodium diet and exercise        CAD (coronary artery disease)    She will need cardiac clearance for  her surgeries         Other   Prediabetes    Lab Results  Component Value Date   HGBA1C 5.9 01/05/2019   disc imp of low glycemic diet and wt loss to prevent DM2       Lumbar spinal stenosis    Planning lumbar laminectomy for disc dz with radiculopathy/spinal stenosis       Pre-operative clearance - Primary    Medical clearance -is cleared  Still pending cardiac clearance Has upcoming breast lumpectomy and then laminectomy  HTN is stable  No h/o anaphylaxis  Has had mild nausea  from anesthesia in the past  Will have to hold asa for surgery       Breast cancer [facility])    For lumpectomy with Dr Marlou Starks and then further tx        Other Visit Diagnoses    Need for influenza vaccination       Relevant Orders   Flu Vaccine QUAD High Dose(Fluad) (Completed)

## 2019-02-19 NOTE — Patient Instructions (Signed)
Flu shot today   Follow up with cardiology as planned   Also good luck with breast cancer surgery and treatment   Medically I think you are ok for surgery  Cardiology will also have to clear you   Take care of yourself

## 2019-02-21 DIAGNOSIS — Z01818 Encounter for other preprocedural examination: Secondary | ICD-10-CM | POA: Insufficient documentation

## 2019-02-21 DIAGNOSIS — C50919 Malignant neoplasm of unspecified site of unspecified female breast: Secondary | ICD-10-CM | POA: Insufficient documentation

## 2019-02-21 DIAGNOSIS — Z853 Personal history of malignant neoplasm of breast: Secondary | ICD-10-CM | POA: Insufficient documentation

## 2019-02-21 NOTE — Assessment & Plan Note (Signed)
Medical clearance -is cleared  Still pending cardiac clearance Has upcoming breast lumpectomy and then laminectomy  HTN is stable  No h/o anaphylaxis  Has had mild nausea from anesthesia in the past  Will have to hold asa for surgery

## 2019-02-21 NOTE — Assessment & Plan Note (Signed)
bp in fair control at this time  BP Readings from Last 1 Encounters:  02/19/19 138/65   No changes needed Most recent labs reviewed  Disc lifstyle change with low sodium diet and exercise

## 2019-02-21 NOTE — Assessment & Plan Note (Signed)
Planning lumbar laminectomy for disc dz with radiculopathy/spinal stenosis

## 2019-02-21 NOTE — Assessment & Plan Note (Signed)
Lab Results  Component Value Date   HGBA1C 5.9 01/05/2019   disc imp of low glycemic diet and wt loss to prevent DM2

## 2019-02-21 NOTE — Assessment & Plan Note (Signed)
For lumpectomy with Dr Marlou Starks and then further tx

## 2019-02-21 NOTE — Assessment & Plan Note (Signed)
She will need cardiac clearance for her surgeries

## 2019-02-22 ENCOUNTER — Telehealth: Payer: Self-pay | Admitting: Hematology and Oncology

## 2019-02-22 NOTE — Progress Notes (Signed)
Location of Breast Cancer: Right Breast  Histology per Pathology Report:  02/10/19 Diagnosis Breast, right, needle core biopsy, 12 o'clock, 7cmfn - INVASIVE MAMMARY CARCINOMA. - MAMMARY CARCINOMA IN SITU. - SEE MICROSCOPIC DESCRIPTION.  Receptor Status: ER(100%), PR (5%), Her2-neu (NEG), Ki-(2%)  Did patient present with symptoms or was this found on screening mammography?: It was found on a screening mammogram.   Past/Anticipated interventions by surgeon, if any: Dr. Marlou Starks saw her in consultation on 02/18/19.   Past/Anticipated interventions by medical oncology, if any; 03/02/19 Dr. Lindi Adie  Lymphedema issues, if any:  N/A  Pain issues, if any:  She reports chronic pain due to a pinched nerve.   SAFETY ISSUES:  Prior radiation? No  Pacemaker/ICD? No  Possible current pregnancy? No  Is the patient on methotrexate? No  Current Complaints / other details:      Ernst Spell, RN 02/22/2019,8:28 AM

## 2019-02-22 NOTE — Telephone Encounter (Signed)
I received new patient referrals from Dr. Marlou Starks at [facility] for both med onc and genetics for breast cancer. Ms. Marlou Starks has been cld and scheduled to see Dr. Lindi Adie on 9/22 at 10:15am and genetics with Raquel Sarna at [facility] on 9/22. She has been made aware to arrive 15 minutes early.

## 2019-02-23 ENCOUNTER — Encounter: Payer: Self-pay | Admitting: Cardiology

## 2019-02-23 ENCOUNTER — Encounter: Payer: Self-pay | Admitting: Radiation Oncology

## 2019-02-23 ENCOUNTER — Ambulatory Visit
Admission: RE | Admit: 2019-02-23 | Discharge: 2019-02-23 | Disposition: A | Discharge status: Home / Self Care | Payer: Medicare HMO | Source: Ambulatory Visit | Attending: Radiation Oncology | Admitting: Radiation Oncology

## 2019-02-23 ENCOUNTER — Ambulatory Visit (INDEPENDENT_AMBULATORY_CARE_PROVIDER_SITE_OTHER): Payer: Medicare HMO | Admitting: Cardiology

## 2019-02-23 ENCOUNTER — Other Ambulatory Visit: Payer: Self-pay

## 2019-02-23 VITALS — BP 136/80 | HR 66 | Ht 62.0 in | Wt 148.0 lb

## 2019-02-23 DIAGNOSIS — Z01810 Encounter for preprocedural cardiovascular examination: Secondary | ICD-10-CM

## 2019-02-23 DIAGNOSIS — Z170 Estrogen receptor positive status [ER+]: Secondary | ICD-10-CM | POA: Diagnosis not present

## 2019-02-23 DIAGNOSIS — I2510 Atherosclerotic heart disease of native coronary artery without angina pectoris: Secondary | ICD-10-CM

## 2019-02-23 DIAGNOSIS — Z803 Family history of malignant neoplasm of breast: Secondary | ICD-10-CM | POA: Diagnosis not present

## 2019-02-23 DIAGNOSIS — C50411 Malignant neoplasm of upper-outer quadrant of right female breast: Secondary | ICD-10-CM | POA: Diagnosis not present

## 2019-02-23 DIAGNOSIS — C50811 Malignant neoplasm of overlapping sites of right female breast: Secondary | ICD-10-CM

## 2019-02-23 DIAGNOSIS — I10 Essential (primary) hypertension: Secondary | ICD-10-CM | POA: Diagnosis not present

## 2019-02-23 NOTE — Progress Notes (Signed)
Radiation Oncology         [phone] ________________________________  Initial outpatient Telephone Consultation due to pandemic precautions; patient unable to access WebEx  Name: Sara [name] MRN: [id number]  Date: 02/23/2019  DOB: 09-03-1931  CC:Tower, Wynelle Fanny, MD  Jovita Kussmaul, MD   REFERRING PHYSICIAN: Jovita Kussmaul, MD  DIAGNOSIS:    ICD-10-CM   1. Carcinoma of upper-outer quadrant of right breast in female, estrogen receptor positive ([place])  C50.411    Z17.0    Cancer Staging Carcinoma of upper-outer quadrant of right breast in female, estrogen receptor positive ([place]) Staging form: Breast, AJCC 8th Edition - Clinical stage from 02/23/2019: cT1c, cN0, cM0, GX, ER+, PR+, HER2- - Signed by Eppie Gibson, MD on 02/23/2019   CHIEF COMPLAINT: Here to discuss management of right breast cancer  HISTORY OF PRESENT ILLNESS::Sara [name] is a 85 y.o. female who presented with breast abnormality on the following imaging: screening mammogram on the date of 02/02/2019.   Ultrasound of right breast on 02/05/2019 revealed 1.2 cm irregular solid mass at 12 o'clock.  Right axilla negative.  Biopsy on date of 02/10/2019 showed invasive and in situ mammary carcinoma, e-cadherin positive.  ER status: 100%; PR status 5%, Her2 status negative; Grade not provided.  She met with Dr. Marlou Starks on 02/18/2019, who anticipates right lumpectomy. She saw her PCP on 02/19/2019 to discuss pre-operative clearance. She was cleared from a medical perspective. Cardiac clearance is still pending.  She is scheduled to meet with Dr. Lindi Adie on 03/02/2019.  She reports that her mother lived to be 66 years old.   Lymphedema issues, if any:  N/A  Pain issues, if any:  She reports chronic pain due to a pinched nerve.    PREVIOUS RADIATION THERAPY: No  PAST MEDICAL HISTORY:  has a past medical history of Allergy, CAD (coronary artery disease), Clinical trial participant, Dyspnea (11/28/2016), Frequent  UTI, GERD (gastroesophageal reflux disease), Hypertension, Osteoarthritis, Osteopenia, Pneumonia, Spinal stenosis, and Stroke ([place]) (1985).    PAST SURGICAL HISTORY: Past Surgical History:  Procedure Laterality Date  . ABDOMINAL HYSTERECTOMY     partial has one ovary  . APPENDECTOMY    . CATARACT EXTRACTION Bilateral   . LEFT HEART CATH AND CORONARY ANGIOGRAPHY N/A 11/29/2016   Procedure: Left Heart Cath and Coronary Angiography;  Surgeon: Sherren Mocha, MD;  Location: [facility];  Service: Cardiovascular;  Laterality: N/A;  . LUMBAR DISC SURGERY     x 3  . rotator cuff surgery Right 2010    FAMILY HISTORY: family history includes Breast cancer in her daughter and maternal aunt; Colon cancer in her maternal uncle; Diabetes in her brother, daughter, sister, sister, and son; Hypertension in her mother; Lung cancer in her brother; Other in her mother and sister.  SOCIAL HISTORY:  reports that she has never smoked. She has never used smokeless tobacco. She reports that she does not drink alcohol or use drugs.  ALLERGIES: Amoxicillin, Atorvastatin, Cefpodoxime proxetil, Clarithromycin, Fosamax [alendronate sodium], Furosemide, and Gabapentin  MEDICATIONS:  Current Outpatient Medications  Medication Sig Dispense Refill  . acetaminophen-codeine (TYLENOL #3) 300-30 MG tablet Take 1 tablet by mouth every 6 (six) hours as needed for moderate pain or severe pain. 30 tablet 0  . amitriptyline (ELAVIL) 10 MG tablet Take 1 tablet (10 mg total) by mouth at bedtime. 30 tablet 1  . aspirin EC 81 MG tablet Take 81 mg by mouth daily.      . Cholecalciferol (VITAMIN  D-3) 1000 UNITS CAPS Take 1,000 Units by mouth daily.     . diazepam (VALIUM) 5 MG tablet Take 1 tablet (5 mg total) by mouth every 12 (twelve) hours as needed for muscle spasms. 10 tablet 0  . diltiazem (CARDIZEM CD) 120 MG 24 hr capsule TAKE 1 CAPSULE (120 MG TOTAL) BY MOUTH DAILY. 90 capsule 1  . Docusate Sodium (EQ STOOL SOFTENER  PO) Take 100 mg by mouth daily as needed (constipation).     Marland Kitchen doxylamine, Sleep, (EQ SLEEP AID) 25 MG tablet Take 25 mg by mouth at bedtime as needed for sleep.     . hydrochlorothiazide (HYDRODIURIL) 25 MG tablet TAKE 1 TABLET (25 MG TOTAL) BY MOUTH DAILY. 90 tablet 1  . nitroGLYCERIN (NITROSTAT) 0.4 MG SL tablet Place 1 tablet (0.4 mg total) under the tongue every 5 (five) minutes as needed for chest pain (max 3 doses in 15 minutes). 25 tablet 3  . polyethylene glycol (MIRALAX / GLYCOLAX) packet Take 17 g by mouth daily.     . potassium chloride (K-DUR) 10 MEQ tablet TAKE 1 TABLET (10 MEQ TOTAL) BY MOUTH DAILY. 90 tablet 1  . traMADol (ULTRAM) 50 MG tablet Take 1 tablet (50 mg total) by mouth every 6 (six) hours as needed. 30 tablet 0   No current facility-administered medications for this encounter.     REVIEW OF SYSTEMS: As above   PHYSICAL EXAM:  vitals were not taken for this visit.      NAD, good historian.     LABORATORY DATA:  Lab Results  Component Value Date   WBC 5.7 01/05/2019   HGB 12.5 01/05/2019   HCT 38.9 01/05/2019   MCV 89.7 01/05/2019   PLT 218.0 01/05/2019   CMP     Component Value Date/Time   NA 140 01/05/2019 1022   NA 143 12/19/2016 1406   K 3.9 01/05/2019 1022   CL 104 01/05/2019 1022   CO2 28 01/05/2019 1022   GLUCOSE 119 (H) 01/05/2019 1022   BUN 15 01/05/2019 1022   BUN 12 12/19/2016 1406   CREATININE 0.84 01/05/2019 1022   CALCIUM 9.9 01/05/2019 1022   PROT 6.5 01/05/2019 1022   ALBUMIN 4.2 01/05/2019 1022   AST 21 01/05/2019 1022   ALT 12 01/05/2019 1022   ALKPHOS 56 01/05/2019 1022   BILITOT 0.7 01/05/2019 1022   GFRNONAA 59 (L) 01/17/2018 1040   GFRAA >60 01/17/2018 1040         RADIOGRAPHY as above     IMPRESSION/PLAN: Right Breast Cancer: ER positive, Stage I, grade not provided in initial biopsy report  For the patient's early stage favorable risk breast cancer, we had a thorough discussion about her options for adjuvant  therapy. One option would be antiestrogen therapy as discussed with medical oncology. She would take a pill for approximately 5 years. The alternative option (but less standard) would be radiotherapy to the breast. The most aggressive option would be to pursue both modalities.  Of note, I discussed the data from the [employer] al trial in the [place] [facility]. She understands that tamoxifen compared to radiation plus tamoxifen demonstrated no survival benefit among the women in this study. The women were 19 years or older with stage I estrogen receptor positive breast cancer. She understands that the main benefit of  adding radiotherapy to anti estrogen therapy would be a very small but measurable local control benefit.  She is interested in considering radiotherapy as part of  her adjuvant therapy.  She still has a pending discussion with medical oncology. We discussed the risks benefits and side effects of radiotherapy. She understands that the side effects would likely include some skin irritation and fatigue during the weeks of radiation. There is a risk of late effects which include but are not necessarily limited to cosmetic changes and rare lung toxicity. I would anticipate delivering approximately 3-4 weeks of radiotherapy.   I look forward to participating in the patient's care.  I will await her referral back to me for postoperative follow-up and eventual CT simulation/treatment planning after reviewing her final pathology with her and helping her make a final decision.   This encounter was provided by telemedicine platform phone as she could not access Webex.  The patient has given verbal consent for this type of encounter and has been advised to only accept a meeting of this type in a secure network environment. The time spent during this encounter was 10 minutes. The attendants for this meeting include Eppie Gibson  and Renato Shin.  During the encounter, Eppie Gibson  was located at [facility] Radiation Oncology Department.  Renato Shin was located at home.   __________________________________________   Eppie Gibson, MD   This document serves as a record of services personally performed by Eppie Gibson, MD. It was created on her behalf by Wilburn Mylar, a trained medical scribe. The creation of this record is based on the scribe's personal observations and the provider's statements to them. This document has been checked and approved by the attending provider.

## 2019-02-23 NOTE — Patient Instructions (Signed)
Medication Instructions:  The current medical regimen is effective;  continue present plan and medications.  If you need a refill on your cardiac medications before your next appointment, please call your pharmacy.   Follow-Up: Follow up as needed with D Skains.  Thank you for choosing [facility]!!

## 2019-02-23 NOTE — Progress Notes (Signed)
Cardiology Office Note:    Date:  02/23/2019   ID:  Sara [name], DOB [birth year], MRN [id number]  PCP:  Abner Greenspan, MD  Cardiologist:  Candee Furbish, MD    Referring MD: Abner Greenspan, MD     History of Present Illness:    Sara [name] is a 85 y.o. female with minimal CAD, no PCI/intervention with mild anterolateral wall hypokinesis, LVEDP of 18 mmHg on heart catheterization [id number], chronic diastolic heart failure, hypertension, GERD, non-smoker seen previously in the hospital on 11/28/16 here for clinic follow-up.  She had a minimal elevation in troponin during that hospitalization.  No prior history of MI, mother lived to age 77.  Ventricular bigeminy was noted on telemetry during the hospital stay creating an effective pulse rate in the 40s.  Lasix was listed as a allergy on her medications.  She is not recalling the reason why she is allergic to Lasix.  She is on hydrochlorothiazide  Her daughter was intolerant to Lipitor.  She also stopped her atorvastatin back on early October 2018.  She could not tolerate PFTs.  02/23/2019- chief complaint-here for preoperative risk stratification prior to lumpectomy and then laminectomy.  Cardiac catheterization reviewed from 2018.  Medical management.  She was cleared medically by Dr. Glori Bickers. She has had PVCs previously on EKG.  Overall she has been doing quite well without any anginal symptoms no heart failure type symptoms no syncope no bleeding.  Minimal shortness of breath with activity previously noted but currently she is doing quite well with this.  No major complaints.   Past Medical History:  Diagnosis Date  . Allergy   . CAD (coronary artery disease)   . Clinical trial participant    [facility] studies in familial dementia  . Dyspnea 11/28/2016  . Frequent UTI   . GERD (gastroesophageal reflux disease)   . Hypertension   . Osteoarthritis    hips, back  . Osteopenia   . Pneumonia   . Spinal  stenosis   . Stroke [facility]) 1985    Past Surgical History:  Procedure Laterality Date  . ABDOMINAL HYSTERECTOMY     partial has one ovary  . APPENDECTOMY    . CATARACT EXTRACTION Bilateral   . LEFT HEART CATH AND CORONARY ANGIOGRAPHY N/A 11/29/2016   Procedure: Left Heart Cath and Coronary Angiography;  Surgeon: Sherren Mocha, MD;  Location: [facility];  Service: Cardiovascular;  Laterality: N/A;  . LUMBAR DISC SURGERY     x 3  . rotator cuff surgery Right 2010    Current Medications: Current Meds  Medication Sig  . acetaminophen-codeine (TYLENOL #3) 300-30 MG tablet Take 1 tablet by mouth every 6 (six) hours as needed for moderate pain or severe pain.  Marland Kitchen aspirin EC 81 MG tablet Take 81 mg by mouth daily.    . Cholecalciferol (VITAMIN D-3) 1000 UNITS CAPS Take 1,000 Units by mouth daily.   . diazepam (VALIUM) 5 MG tablet Take 1 tablet (5 mg total) by mouth every 12 (twelve) hours as needed for muscle spasms.  Marland Kitchen diltiazem (CARDIZEM CD) 120 MG 24 hr capsule TAKE 1 CAPSULE (120 MG TOTAL) BY MOUTH DAILY.  Marland Kitchen Docusate Sodium (EQ STOOL SOFTENER PO) Take 100 mg by mouth daily as needed (constipation).   Marland Kitchen doxylamine, Sleep, (EQ SLEEP AID) 25 MG tablet Take 25 mg by mouth at bedtime as needed for sleep.   . hydrochlorothiazide (HYDRODIURIL) 25 MG tablet TAKE 1 TABLET (25 MG  TOTAL) BY MOUTH DAILY.  . nitroGLYCERIN (NITROSTAT) 0.4 MG SL tablet Place 1 tablet (0.4 mg total) under the tongue every 5 (five) minutes as needed for chest pain (max 3 doses in 15 minutes).  . polyethylene glycol (MIRALAX / GLYCOLAX) packet Take 17 g by mouth daily.   . potassium chloride (K-DUR) 10 MEQ tablet TAKE 1 TABLET (10 MEQ TOTAL) BY MOUTH DAILY.  . traMADol (ULTRAM) 50 MG tablet Take 1 tablet (50 mg total) by mouth every 6 (six) hours as needed.     Allergies:   Amoxicillin, Atorvastatin, Cefpodoxime proxetil, Clarithromycin, Fosamax [alendronate sodium], Furosemide, and Gabapentin   Social History    Socioeconomic History  . Marital status: Married    Spouse name: Not on file  . Number of children: 6  . Years of education: Not on file  . Highest education level: Not on file  Occupational History  . Occupation: RETIRED    Employer: RETIRED  Social Needs  . Financial resource strain: Not on file  . Food insecurity    Worry: Not on file    Inability: Not on file  . Transportation needs    Medical: No    Non-medical: No  Tobacco Use  . Smoking status: Never Smoker  . Smokeless tobacco: Never Used  Substance and Sexual Activity  . Alcohol use: No    Alcohol/week: 0.0 standard drinks  . Drug use: No  . Sexual activity: Not on file  Lifestyle  . Physical activity    Days per week: Not on file    Minutes per session: Not on file  . Stress: Not on file  Relationships  . Social Herbalist on phone: Not on file    Gets together: Not on file    Attends religious service: Not on file    Active member of club or organization: Not on file    Attends meetings of clubs or organizations: Not on file    Relationship status: Not on file  Other Topics Concern  . Not on file  Social History Narrative  . Not on file     Family History: The patient's family history includes Breast cancer in her daughter and maternal aunt; Colon cancer in her maternal uncle; Diabetes in her brother, daughter, sister, sister, and son; Hypertension in her mother; Lung cancer in her brother; Other in her mother and sister. There is no history of Esophageal cancer, Rectal cancer, or Stomach cancer. ROS:   Please see the history of present illness.     All other systems reviewed and are negative.  EKGs/Labs/Other Studies Reviewed:    The following studies were reviewed today:  LHC on 11/29/16 which showed moderate stenosis of small to medium caliber non dominant RCA (70%) and minimal irregularities of left system, EF 50-55% with mild segmental LV dysfunction of distal anterolateral wall and  mildly elevated filling pressures. Medical therapy was recommended.   Diagnostic Dominance: Left    2D ECHO 11/29/16 showed EF 60-65% with no WMAs, G1DD, mild AR, mild TR, mod PR, PA pressure 36.     EKG: Today 02/23/2019-normal sinus rhythm 61 with left anterior fascicular block personally reviewed-prior prior on 07/01/2018 shows sinus rhythm with left anterior fascicular block and PVC  Recent Labs: 01/05/2019: ALT 12; BUN 15; Creatinine, Ser 0.84; Hemoglobin 12.5; Platelets 218.0; Potassium 3.9; Sodium 140; TSH 1.14  Recent Lipid Panel    Component Value Date/Time   CHOL 189 01/05/2019 1022   TRIG 138.0 01/05/2019  1022   HDL 60.00 01/05/2019 1022   CHOLHDL 3 01/05/2019 1022   VLDL 27.6 01/05/2019 1022   LDLCALC 101 (H) 01/05/2019 1022   LDLDIRECT 143.1 06/16/2013 1510    Physical Exam:    VS:  BP 136/80   Pulse 66   Ht 5\' 2"  (1.575 m)   Wt 148 lb (67.1 kg)   SpO2 97%   BMI 27.07 kg/m     Wt Readings from Last 3 Encounters:  02/23/19 148 lb (67.1 kg)  02/19/19 146 lb 5 oz (66.4 kg)  01/21/19 149 lb (67.6 kg)     GEN: Well nourished, well developed, in no acute distress  HEENT: normal  Neck: no JVD, carotid bruits, or masses Cardiac: RRR; no murmurs, rubs, or gallops,no edema  Respiratory:  clear to auscultation bilaterally, normal work of breathing GI: soft, nontender, nondistended, + BS MS: no deformity or atrophy  Skin: warm and dry, no rash Neuro:  Alert and Oriented x 3, Strength and sensation are intact Psych: euthymic mood, full affect   ASSESSMENT:    1. Pre-operative cardiovascular examination   2. Essential hypertension   3. Coronary artery disease involving native coronary artery of native heart without angina pectoris    PLAN:    In order of problems listed above:  Preoperative risk ratification for breast lumpectomy and back laminectomy - Cardiac catheterization from 2018 personally reviewed as above.  Based upon these findings, she may  proceed with both surgeries with moderate overall cardiac risk, based mainly upon age.  She is able to get around without any anginal symptoms no syncope no arrhythmias no bleeding.  Nonobstructive coronary artery disease -Cardiac catheterization reviewed.  Medical therapy.  Unfortunately she does not wish to take atorvastatin.  When asked about this today, she actually does not recall why she is not taking it.  I would encourage possible use of this medication.  This may help stabilize some of her moderate coronary artery disease.   Her goals are to live like her mother to age 48  PRN follow-up   Medication Adjustments/Labs and Tests Ordered: Current medicines are reviewed at length with the patient today.  Concerns regarding medicines are outlined above.  Orders Placed This Encounter  Procedures  . EKG 12-Lead   No orders of the defined types were placed in this encounter.   Signed, Candee Furbish, MD  02/23/2019 12:06 PM    [place]

## 2019-03-01 NOTE — Progress Notes (Signed)
[name] CONSULT NOTE  Patient Care Team: Tower, Wynelle Fanny, MD as PCP - General Marlou Porch Thana Farr, MD as PCP - Cardiology (Cardiology) Mauro Kaufmann, RN as Oncology Nurse Navigator Rockwell Germany, RN as Oncology Nurse Navigator  CHIEF COMPLAINTS/PURPOSE OF CONSULTATION:  Newly diagnosed breast cancer  HISTORY OF PRESENTING ILLNESS:  Sara [name] 85 y.o. female is here because of recent diagnosis of invasive mammary carcinoma of the right breast. The cancer was detected on a routine screening mammogram on 02/02/19. Diagnostic mammogram and [place] on 02/05/19 showed a 1.2cm mass in the right breast. Biopsy on 02/10/19 showed invasive mammary carcinoma with mammary carcinoma in situ, HER-2 negative (1+), ER+ 100%, PR+ 5% Ki67 2%. She presents to the clinic today for initial evaluation and discussion of treatment options.   I reviewed her records extensively and collaborated the history with the patient.  SUMMARY OF ONCOLOGIC HISTORY: Oncology History  Carcinoma of upper-outer quadrant of right breast in female, estrogen receptor positive ([place])  02/23/2019 Initial Diagnosis   Routine screening mammogram detected a 1.2cm mass in the right breast. Biopsy showed invasive mammary carcinoma with mammary carcinoma in situ, HER-2 - (1+), ER+ 100%, PR+ 5% Ki67 2%.    02/23/2019 Cancer Staging   Staging form: Breast, AJCC 8th Edition - Clinical stage from 02/23/2019: cT1c, cN0, cM0, GX, ER+, PR+, HER2- - Signed by Eppie Gibson, MD on 02/23/2019      MEDICAL HISTORY:  Past Medical History:  Diagnosis Date  . Allergy   . CAD (coronary artery disease)   . Clinical trial participant    [facility] studies in familial dementia  . Dyspnea 11/28/2016  . Frequent UTI   . GERD (gastroesophageal reflux disease)   . Hypertension   . Osteoarthritis    hips, back  . Osteopenia   . Pneumonia   . Spinal stenosis   . Stroke [facility]) 1985    SURGICAL HISTORY: Past Surgical  History:  Procedure Laterality Date  . ABDOMINAL HYSTERECTOMY     partial has one ovary  . APPENDECTOMY    . CATARACT EXTRACTION Bilateral   . LEFT HEART CATH AND CORONARY ANGIOGRAPHY N/A 11/29/2016   Procedure: Left Heart Cath and Coronary Angiography;  Surgeon: Sherren Mocha, MD;  Location: [facility];  Service: Cardiovascular;  Laterality: N/A;  . LUMBAR DISC SURGERY     x 3  . rotator cuff surgery Right 2010    SOCIAL HISTORY: Social History   Socioeconomic History  . Marital status: Married    Spouse name: Not on file  . Number of children: 6  . Years of education: Not on file  . Highest education level: Not on file  Occupational History  . Occupation: RETIRED    Employer: RETIRED  Social Needs  . Financial resource strain: Not on file  . Food insecurity    Worry: Not on file    Inability: Not on file  . Transportation needs    Medical: No    Non-medical: No  Tobacco Use  . Smoking status: Never Smoker  . Smokeless tobacco: Never Used  Substance and Sexual Activity  . Alcohol use: No    Alcohol/week: 0.0 standard drinks  . Drug use: No  . Sexual activity: Not on file  Lifestyle  . Physical activity    Days per week: Not on file    Minutes per session: Not on file  . Stress: Not on file  Relationships  .  Social Herbalist on phone: Not on file    Gets together: Not on file    Attends religious service: Not on file    Active member of club or organization: Not on file    Attends meetings of clubs or organizations: Not on file    Relationship status: Not on file  . Intimate partner violence    Fear of current or ex partner: No    Emotionally abused: No    Physically abused: No    Forced sexual activity: No  Other Topics Concern  . Not on file  Social History Narrative  . Not on file    FAMILY HISTORY: Family History  Problem Relation Age of Onset  . Lung cancer Brother   . Other Mother        kidney tumor  . Hypertension  Mother   . Diabetes Sister   . Diabetes Brother   . Diabetes Sister        x 2  . Colon cancer Maternal Uncle   . Other Sister        intestine burst  . Breast cancer Maternal Aunt        x 2  . Breast cancer Daughter   . Diabetes Son        x 3  . Diabetes Daughter   . Esophageal cancer Neg Hx   . Rectal cancer Neg Hx   . Stomach cancer Neg Hx     ALLERGIES:  is allergic to amoxicillin; atorvastatin; cefpodoxime proxetil; clarithromycin; fosamax [alendronate sodium]; furosemide; and gabapentin.  MEDICATIONS:  Current Outpatient Medications  Medication Sig Dispense Refill  . acetaminophen-codeine (TYLENOL #3) 300-30 MG tablet Take 1 tablet by mouth every 6 (six) hours as needed for moderate pain or severe pain. 30 tablet 0  . aspirin EC 81 MG tablet Take 81 mg by mouth daily.      . Cholecalciferol (VITAMIN D-3) 1000 UNITS CAPS Take 1,000 Units by mouth daily.     . diazepam (VALIUM) 5 MG tablet Take 1 tablet (5 mg total) by mouth every 12 (twelve) hours as needed for muscle spasms. 10 tablet 0  . diltiazem (CARDIZEM CD) 120 MG 24 hr capsule TAKE 1 CAPSULE (120 MG TOTAL) BY MOUTH DAILY. 90 capsule 1  . Docusate Sodium (EQ STOOL SOFTENER PO) Take 100 mg by mouth daily as needed (constipation).     Marland Kitchen doxylamine, Sleep, (EQ SLEEP AID) 25 MG tablet Take 25 mg by mouth at bedtime as needed for sleep.     . hydrochlorothiazide (HYDRODIURIL) 25 MG tablet TAKE 1 TABLET (25 MG TOTAL) BY MOUTH DAILY. 90 tablet 1  . nitroGLYCERIN (NITROSTAT) 0.4 MG SL tablet Place 1 tablet (0.4 mg total) under the tongue every 5 (five) minutes as needed for chest pain (max 3 doses in 15 minutes). 25 tablet 3  . polyethylene glycol (MIRALAX / GLYCOLAX) packet Take 17 g by mouth daily.     . potassium chloride (K-DUR) 10 MEQ tablet TAKE 1 TABLET (10 MEQ TOTAL) BY MOUTH DAILY. 90 tablet 1  . traMADol (ULTRAM) 50 MG tablet Take 1 tablet (50 mg total) by mouth every 6 (six) hours as needed. 30 tablet 0   No  current facility-administered medications for this visit.     REVIEW OF SYSTEMS:   Constitutional: Denies fevers, chills or abnormal night sweats Eyes: Denies blurriness of vision, double vision or watery eyes Ears, nose, mouth, throat, and face: Denies mucositis or sore  throat Respiratory: Denies cough, dyspnea or wheezes Cardiovascular: Denies palpitation, chest discomfort or lower extremity swelling Gastrointestinal:  Denies nausea, heartburn or change in bowel habits Skin: Denies abnormal skin rashes Lymphatics: Denies new lymphadenopathy or easy bruising Neurological:Denies numbness, tingling or new weaknesses Behavioral/Psych: Mood is stable, no new changes  Breast: Denies any palpable lumps or discharge All other systems were reviewed with the patient and are negative.  PHYSICAL EXAMINATION: ECOG PERFORMANCE STATUS: 1 - Symptomatic but completely ambulatory  Vitals:   03/02/19 1024  BP: (!) 161/95  Pulse: 69  Resp: 17  Temp: 98.3 F (36.8 C)  SpO2: 100%   Filed Weights   03/02/19 1024  Weight: 147 lb 12.8 oz (67 kg)    GENERAL:alert, no distress and comfortable SKIN: skin color, texture, turgor are normal, no rashes or significant lesions EYES: normal, conjunctiva are pink and non-injected, sclera clear OROPHARYNX:no exudate, no erythema and lips, buccal mucosa, and tongue normal  NECK: supple, thyroid normal size, non-tender, without nodularity LYMPH:  no palpable lymphadenopathy in the cervical, axillary or inguinal LUNGS: clear to auscultation and percussion with normal breathing effort HEART: regular rate & rhythm and no murmurs and no lower extremity edema ABDOMEN:abdomen soft, non-tender and normal bowel sounds Musculoskeletal:no cyanosis of digits and no clubbing  PSYCH: alert & oriented x 3 with fluent speech NEURO: no focal motor/sensory deficits BREAST: No palpable nodules in breast. No palpable axillary or supraclavicular lymphadenopathy (exam performed  in the presence of a chaperone)   LABORATORY DATA:  I have reviewed the data as listed Lab Results  Component Value Date   WBC 5.7 01/05/2019   HGB 12.5 01/05/2019   HCT 38.9 01/05/2019   MCV 89.7 01/05/2019   PLT 218.0 01/05/2019   Lab Results  Component Value Date   NA 140 01/05/2019   K 3.9 01/05/2019   CL 104 01/05/2019   CO2 28 01/05/2019    RADIOGRAPHIC STUDIES: I have personally reviewed the radiological reports and agreed with the findings in the report.  ASSESSMENT AND PLAN:  Carcinoma of upper-outer quadrant of right breast in female, estrogen receptor positive ([place]) 04/25/2019:Routine screening mammogram detected a 1.2cm mass in the right breast. Biopsy showed invasive mammary carcinoma with mammary carcinoma in situ, HER-2 - (1+), ER+ 100%, PR+ 5% Ki67 2%.  T1c N0 stage Ia clinical stage  Pathology and radiology counseling:Discussed with the patient, the details of pathology including the type of breast cancer,the clinical staging, the significance of ER, PR and HER-2/neu receptors and the implications for treatment. After reviewing the pathology in detail, we proceeded to discuss the different treatment options   Recommendations: 1. Breast conserving surgery followed by 2.  Adjuvant radiation therapy based on patient's personal preference 3. Adjuvant antiestrogen therapy  Return to clinic after surgery to discuss the pathology report.   All questions were answered. The patient knows to call the clinic with any problems, questions or concerns.   Rulon Eisenmenger, MD 03/02/2019    I, Molly Dorshimer, am acting as scribe for Nicholas Lose, MD.  I have reviewed the above documentation for accuracy and completeness, and I agree with the above.

## 2019-03-02 ENCOUNTER — Other Ambulatory Visit: Payer: Self-pay

## 2019-03-02 ENCOUNTER — Encounter: Payer: Self-pay | Admitting: Genetic Counselor

## 2019-03-02 ENCOUNTER — Inpatient Hospital Stay (HOSPITAL_BASED_OUTPATIENT_CLINIC_OR_DEPARTMENT_OTHER): Payer: Medicare HMO | Admitting: Genetic Counselor

## 2019-03-02 ENCOUNTER — Other Ambulatory Visit: Payer: Medicare HMO

## 2019-03-02 ENCOUNTER — Inpatient Hospital Stay: Payer: Medicare HMO | Attending: Hematology and Oncology | Admitting: Hematology and Oncology

## 2019-03-02 DIAGNOSIS — Z801 Family history of malignant neoplasm of trachea, bronchus and lung: Secondary | ICD-10-CM

## 2019-03-02 DIAGNOSIS — C50411 Malignant neoplasm of upper-outer quadrant of right female breast: Secondary | ICD-10-CM

## 2019-03-02 DIAGNOSIS — M1990 Unspecified osteoarthritis, unspecified site: Secondary | ICD-10-CM | POA: Diagnosis not present

## 2019-03-02 DIAGNOSIS — Z8673 Personal history of transient ischemic attack (TIA), and cerebral infarction without residual deficits: Secondary | ICD-10-CM | POA: Diagnosis not present

## 2019-03-02 DIAGNOSIS — Z8042 Family history of malignant neoplasm of prostate: Secondary | ICD-10-CM

## 2019-03-02 DIAGNOSIS — Z803 Family history of malignant neoplasm of breast: Secondary | ICD-10-CM | POA: Insufficient documentation

## 2019-03-02 DIAGNOSIS — Z79899 Other long term (current) drug therapy: Secondary | ICD-10-CM | POA: Diagnosis not present

## 2019-03-02 DIAGNOSIS — K219 Gastro-esophageal reflux disease without esophagitis: Secondary | ICD-10-CM | POA: Diagnosis not present

## 2019-03-02 DIAGNOSIS — I2510 Atherosclerotic heart disease of native coronary artery without angina pectoris: Secondary | ICD-10-CM | POA: Insufficient documentation

## 2019-03-02 DIAGNOSIS — Z170 Estrogen receptor positive status [ER+]: Secondary | ICD-10-CM | POA: Diagnosis not present

## 2019-03-02 DIAGNOSIS — Z7982 Long term (current) use of aspirin: Secondary | ICD-10-CM | POA: Diagnosis not present

## 2019-03-02 DIAGNOSIS — M8580 Other specified disorders of bone density and structure, unspecified site: Secondary | ICD-10-CM | POA: Diagnosis not present

## 2019-03-02 DIAGNOSIS — I10 Essential (primary) hypertension: Secondary | ICD-10-CM | POA: Insufficient documentation

## 2019-03-02 NOTE — Progress Notes (Signed)
REFERRING PROVIDER: Jovita Kussmaul, MD [address] [place],  [place] [address]  PRIMARY PROVIDER:  Abner Greenspan, MD  PRIMARY REASON FOR VISIT:  1. Carcinoma of upper-outer quadrant of right breast in female, estrogen receptor positive ([place])   2. Family history of breast cancer   3. Family history of prostate cancer   4. Family history of lung cancer      HISTORY OF PRESENT ILLNESS:   Ms. Beckers, a 85 y.o. female, was seen for a [place] cancer genetics consultation at the request of Dr. Marlou Starks due to a personal and family of breast cancer as well as a family history of prostate cancer.  Ms. Sedivy presents to clinic today to discuss the possibility of a hereditary predisposition to cancer, genetic testing, and to further clarify her future cancer risks, as well as potential cancer risks for family members.   In 2020, at the age of 84, Ms. Watling was diagnosed with invasive mammary carcinoma, ER+/PR+/Her2-, of the right breast. The treatment plan includes surgery, antiestrogen therapy, and possibly radiation therapy.   CANCER HISTORY:  Oncology History  Carcinoma of upper-outer quadrant of right breast in female, estrogen receptor positive ([place])  02/23/2019 Initial Diagnosis   Routine screening mammogram detected a 1.2cm mass in the right breast. Biopsy showed invasive mammary carcinoma with mammary carcinoma in situ, HER-2 - (1+), ER+ 100%, PR+ 5% Ki67 2%.    02/23/2019 Cancer Staging   Staging form: Breast, AJCC 8th Edition - Clinical stage from 02/23/2019: cT1c, cN0, cM0, GX, ER+, PR+, HER2- - Signed by Eppie Gibson, MD on 02/23/2019      RISK FACTORS:  Menarche was at age 56.  First live birth age: not asked.  OCP use for approximately 0 years.  Ovaries intact: no - 1 ovary intact.  Hysterectomy: yes, 37.  Menopausal status: postmenopausal.  HRT use: 0 years. Colonoscopy: yes; attempted in 2015. Mammogram within the last year: yes. Number of breast  biopsies: 1.  Past Medical History:  Diagnosis Date  . Allergy   . CAD (coronary artery disease)   . Clinical trial participant    [facility] studies in familial dementia  . Dyspnea 11/28/2016  . Family history of breast cancer   . Family history of lung cancer   . Family history of prostate cancer   . Frequent UTI   . GERD (gastroesophageal reflux disease)   . Hypertension   . Osteoarthritis    hips, back  . Osteopenia   . Pneumonia   . Spinal stenosis   . Stroke [facility]) 1985    Past Surgical History:  Procedure Laterality Date  . ABDOMINAL HYSTERECTOMY     partial has one ovary  . APPENDECTOMY    . CATARACT EXTRACTION Bilateral   . LEFT HEART CATH AND CORONARY ANGIOGRAPHY N/A 11/29/2016   Procedure: Left Heart Cath and Coronary Angiography;  Surgeon: Sherren Mocha, MD;  Location: [facility];  Service: Cardiovascular;  Laterality: N/A;  . LUMBAR DISC SURGERY     x 3  . rotator cuff surgery Right 2010    Social History   Socioeconomic History  . Marital status: Married    Spouse name: Not on file  . Number of children: 6  . Years of education: Not on file  . Highest education level: Not on file  Occupational History  . Occupation: RETIRED    Employer: RETIRED  Social Needs  . Financial resource strain: Not on  file  . Food insecurity    Worry: Not on file    Inability: Not on file  . Transportation needs    Medical: No    Non-medical: No  Tobacco Use  . Smoking status: Never Smoker  . Smokeless tobacco: Never Used  Substance and Sexual Activity  . Alcohol use: No    Alcohol/week: 0.0 standard drinks  . Drug use: No  . Sexual activity: Not on file  Lifestyle  . Physical activity    Days per week: Not on file    Minutes per session: Not on file  . Stress: Not on file  Relationships  . Social Herbalist on phone: Not on file    Gets together: Not on file    Attends religious service: Not on file    Active member of club or  organization: Not on file    Attends meetings of clubs or organizations: Not on file    Relationship status: Not on file  Other Topics Concern  . Not on file  Social History Narrative  . Not on file     FAMILY HISTORY:  We obtained a detailed, 4-generation family history.  Significant diagnoses are listed below: Family History  Problem Relation Age of Onset  . Lung cancer Brother 74  . Other Mother        kidney tumor  . Hypertension Mother   . Diabetes Sister   . Diabetes Brother   . Diabetes Sister        x 2  . Colon cancer Maternal Uncle   . Other Sister        intestine burst  . Breast cancer Maternal Aunt        x 2  . Breast cancer Daughter        unknown cancer  . Diabetes Son        x 3  . Diabetes Daughter   . Breast cancer Sister        x2, diagnosed in 50s and 61s  . Prostate cancer Maternal Uncle   . Esophageal cancer Neg Hx   . Rectal cancer Neg Hx   . Stomach cancer Neg Hx     Ms. Tones has 6 living children - two daughters and four sons ranging in age from 66 to 22. She also had two children that died shortly after they were born.  One of Ms. Aplin's daughters was initially diagnosed with an unknown type of cancer when she was younger than 83, and is currently going through treatment for cancer again.   Ms. Marschall has five sisters and 6 brothers. Two of her sisters have had breast cancer, one diagnosed at age 34 and one diagnosed in her 29s. One of her brothers died from cancer that started in his leg when he was in his 66s, and another brother died from lung cancer in his 89s.   Ms. Breault mother died at the age of 67 without cancer. Of her 31 maternal aunts and uncles, two aunts had breast cancer and two uncles had prostate cancer at unknown ages. Ms. Vosler's maternal grandparents died at older ages. There are no other known diagnoses of cancer on the maternal side of the family.  Ms. Liddicoat's father died in his 57s. She had  two paternal uncles and two paternal aunts who all died at ages older than 22 and did not have cancer. Ms. Dibuono is unsure how old her paternal grandparents were when they died.  Ms. Murfin is unaware of previous family history of genetic testing for hereditary cancer risks. Her ancestors are of [place], Native American, and Caucasian descent. There is no reported Ashkenazi Jewish ancestry. There is no known consanguinity.  GENETIC COUNSELING ASSESSMENT: Ms. Wardrop is a 85 y.o. female with a personal and family history of breast cancer as well as a family history of prostate cancer, which is somewhat suggestive of a hereditary cancer syndrome. We, therefore, discussed and recommended the following at today's visit.   DISCUSSION: We discussed that 5 - 10% of breast cancer is hereditary, with most cases associated with BRCA1/2.  There are other genes that can be associated with hereditary breast cancer syndromes.  These include ATM, CHEK2, PALB2, etc.  We discussed that testing is beneficial for several reasons including knowing about other cancer risks, identifying potential screening and risk-reduction options that may be appropriate, and to understand if other family members could be at risk for cancer and allow them to undergo genetic testing.   We reviewed the characteristics, features and inheritance patterns of hereditary cancer syndromes. We also discussed genetic testing, including the appropriate family members to test, the process of testing, insurance coverage and turn-around-time for results. We discussed the implications of a negative, positive and/or variant of uncertain significant result. In order to get genetic test results in a timely manner so that Ms. Bozarth can use these genetic test results for surgical decisions, we recommended Ms. Tadros pursue genetic testing for the [facility] STAT Breast Cancer Panel.   The STAT Breast cancer panel offered by [facility]  includes sequencing and rearrangement analysis for the following 9 genes:  ATM, BRCA1, BRCA2, CDH1, CHEK2, PALB2, PTEN, STK11 and TP53.     Based on Ms. Oyola's personal and family history of cancer, she meets medical criteria for genetic testing. Despite that she meets criteria, she may still have an out of pocket cost. Before proceeding with genetic testing, Ms. Emery would like a more precise estimate of what her out of pocket cost may be. We will, therefore, delay testing until this information is available. At that time, Ms. Dicocco will determine if she is comfortable with the cost and if she would like to proceed.  PLAN: Despite our recommendation, Ms. Mitten did not wish to pursue genetic testing at today's visit due to concerns about the cost. We will wait to hear back from the genetic testing laboratory regarding the out of pocket estimate before Ms. Hemmer makes a final decision about genetic testing. I will reach out to Ms. Levier once that information is available.    Ms. Duda questions were answered to her satisfaction today. Our contact information was provided should additional questions or concerns arise. Thank you for the referral and allowing [place] to share in the care of your patient.   Clint Guy, MS, [facility] Certified Genetic Counselor [email or web address] Phone: [phone]  The patient was seen for a total of 40 minutes in face-to-face genetic counseling.  This patient was discussed with Drs. Magrinat, Lindi Adie and/or Burr Medico who agrees with the above.    _______________________________________________________________________ For Office Staff:  Number of people involved in session: 1 Was an Intern/ student involved with case: no

## 2019-03-02 NOTE — Assessment & Plan Note (Addendum)
04/25/2019:Routine screening mammogram detected a 1.2cm mass in the right breast. Biopsy showed invasive mammary carcinoma with mammary carcinoma in situ, HER-2 - (1+), ER+ 100%, PR+ 5% Ki67 2%.  T1c N0 stage Ia clinical stage  Pathology and radiology counseling:Discussed with the patient, the details of pathology including the type of breast cancer,the clinical staging, the significance of ER, PR and HER-2/neu receptors and the implications for treatment. After reviewing the pathology in detail, we proceeded to discuss the different treatment options   Recommendations: 1. Breast conserving surgery followed by 2.  Adjuvant radiation therapy based on patient's personal preference 3. Adjuvant antiestrogen therapy  Return to clinic after surgery to discuss the pathology report.

## 2019-03-03 ENCOUNTER — Other Ambulatory Visit: Payer: Self-pay | Admitting: *Deleted

## 2019-03-03 ENCOUNTER — Encounter: Payer: Self-pay | Admitting: *Deleted

## 2019-03-03 ENCOUNTER — Telehealth: Payer: Self-pay | Admitting: Genetic Counselor

## 2019-03-03 NOTE — Telephone Encounter (Signed)
Discussed with Ms. Jocson that her estimated cost of genetic testing will be no more than $50 through [employer]. Ms. Ayotte has discussed genetic testing with her children and would not like to proceed at this time. We understand this decision and remain available to coordinate genetic testing at any time in the future

## 2019-03-03 NOTE — Progress Notes (Signed)
m °

## 2019-03-04 ENCOUNTER — Telehealth: Payer: Self-pay | Admitting: Hematology and Oncology

## 2019-03-04 NOTE — Telephone Encounter (Signed)
Scheduled appt per 9/23 sch message - pt aware of appt date and time

## 2019-03-17 ENCOUNTER — Other Ambulatory Visit: Payer: Self-pay | Admitting: Family Medicine

## 2019-03-17 MED ORDER — TRAMADOL HCL 50 MG PO TABS: 50.0000 mg | ORAL_TABLET | Freq: Four times a day (QID) | ORAL | 0 refills | Status: DC | PRN

## 2019-03-17 NOTE — Telephone Encounter (Signed)
See prev note. Last refilled by ortho doctor on 11/05/18 #30 with 0 refills, that is the last refill the state database has on file for pt also. Last OV was 02/19/19 f/u before her surgery. Please advise

## 2019-03-17 NOTE — Telephone Encounter (Signed)
Pt needs Tramadol called in to [facility]. She said it was sent to [facility] but she never received it from them and it needs to be sent to [facility]. She said she needs her pain meds.  CB [phone]

## 2019-04-01 NOTE — Pre-Procedure Instructions (Signed)
[id number] [place], [place] [address] [place] [address] Phone: [phone] Fax: [phone]  [id number] [place], [place] [id number] [place] [id number] Phone: [phone] Fax: [phone]  [facility] - [place], [place] [address] Phone: [phone] Fax: [phone]      Your procedure is scheduled on 04-07-19 Wednesday  Report to [facility] Main Entrance "A" at 1230 P.M., and check in at the Admitting office.  Call this number if you have problems the morning of surgery:  [phone]  Call [phone] if you have any questions prior to your surgery date Monday-Friday 8am-4pm    Remember:  Do not eat  after midnight the night before your surgery  You may drink clear liquids until 1130AM the morning of your surgery.   Clear liquids allowed are: Water, Non-Citrus Juices (without pulp), Carbonated Beverages, Clear Tea, Black Coffee Only, and Gatorade   Take these medicines the morning of surgery with A SIP OF WATER : diltiazem (CARDIZEM CD)  traMADol (ULTRAM) as needed nitroGLYCERIN (NITROSTAT)as needed   Follow your surgeon's instructions on when to stop Aspirin.  If no instructions were given by your surgeon then you will need to call the office to get those instructions.    7 days prior to surgery STOP taking any Aspirin (unless otherwise instructed by your surgeon), Aleve, Naproxen, Ibuprofen, Motrin, Advil, Goody's, BC's, all herbal medications, fish oil, and all vitamins.    The Morning of Surgery  Do not wear jewelry, make-up or nail polish.  Do not wear lotions, powders, or perfumes, or deodorant  Do not shave 48 hours prior to surgery.    Do not bring valuables to the hospital.  [facility] is not responsible for any belongings or  valuables.  If you are a smoker, DO NOT Smoke 24 hours prior to surgery IF you wear a CPAP at night please bring your mask, tubing, and machine the morning of surgery   Remember that you must have someone to transport you home after your surgery, and remain with you for 24 hours if you are discharged the same day.   Contacts, glasses, hearing aids, dentures or bridgework may not be worn into surgery.    Leave your suitcase in the car.  After surgery it may be brought to your room.  For patients admitted to the hospital, discharge time will be determined by your treatment team.  Patients discharged the day of surgery will not be allowed to drive home.    Special instructions:   [place]- Preparing For Surgery  Before surgery, you can play an important role. Because skin is not sterile, your skin needs to be as free of germs as possible. You can reduce the number of germs on your skin by washing with CHG (chlorahexidine gluconate) Soap before surgery.  CHG is an antiseptic cleaner which kills germs and bonds with the skin to continue killing germs even after washing.    Oral Hygiene is also important to reduce your risk of infection.  Remember - BRUSH YOUR TEETH THE MORNING OF SURGERY WITH YOUR REGULAR TOOTHPASTE  Please do not use if you have an allergy to CHG or antibacterial soaps. If your skin becomes reddened/irritated stop using the CHG.  Do not shave (including legs and underarms) for at least  48 hours prior to first CHG shower. It is OK to shave your face.  Please follow these instructions carefully.   1. Shower the NIGHT BEFORE SURGERY and the MORNING OF SURGERY with CHG Soap.   2. If you chose to wash your hair, wash your hair first as usual with your normal shampoo.  3. After you shampoo, rinse your hair and body thoroughly to remove the shampoo.  4. Use CHG as you would any other liquid soap. You can apply CHG directly to the skin and wash gently with a scrungie or a  clean washcloth.   5. Apply the CHG Soap to your body ONLY FROM THE NECK DOWN.  Do not use on open wounds or open sores. Avoid contact with your eyes, ears, mouth and genitals (private parts). Wash Face and genitals (private parts)  with your normal soap.   6. Wash thoroughly, paying special attention to the area where your surgery will be performed.  7. Thoroughly rinse your body with warm water from the neck down.  8. DO NOT shower/wash with your normal soap after using and rinsing off the CHG Soap.  9. Pat yourself dry with a CLEAN TOWEL.  10. Wear CLEAN PAJAMAS to bed the night before surgery, wear comfortable clothes the morning of surgery  11. Place CLEAN SHEETS on your bed the night of your first shower and DO NOT SLEEP WITH PETS.    Day of Surgery:  Do not apply any deodorants/lotions. Please shower the morning of surgery with the CHG soap  Please wear clean clothes to the hospital/surgery center.   Remember to brush your teeth WITH YOUR REGULAR TOOTHPASTE.   Please read over the  fact sheets that you were given.

## 2019-04-02 ENCOUNTER — Other Ambulatory Visit: Payer: Self-pay

## 2019-04-02 ENCOUNTER — Encounter (HOSPITAL_COMMUNITY): Payer: Self-pay

## 2019-04-02 ENCOUNTER — Encounter (HOSPITAL_COMMUNITY)
Admission: RE | Admit: 2019-04-02 | Discharge: 2019-04-02 | Disposition: A | Discharge status: Home / Self Care | Payer: Medicare HMO | Source: Ambulatory Visit | Attending: General Surgery | Admitting: General Surgery

## 2019-04-02 DIAGNOSIS — I5032 Chronic diastolic (congestive) heart failure: Secondary | ICD-10-CM | POA: Diagnosis not present

## 2019-04-02 DIAGNOSIS — Z801 Family history of malignant neoplasm of trachea, bronchus and lung: Secondary | ICD-10-CM | POA: Insufficient documentation

## 2019-04-02 DIAGNOSIS — C50911 Malignant neoplasm of unspecified site of right female breast: Secondary | ICD-10-CM | POA: Insufficient documentation

## 2019-04-02 DIAGNOSIS — Z90711 Acquired absence of uterus with remaining cervical stump: Secondary | ICD-10-CM | POA: Insufficient documentation

## 2019-04-02 DIAGNOSIS — I2510 Atherosclerotic heart disease of native coronary artery without angina pectoris: Secondary | ICD-10-CM | POA: Diagnosis not present

## 2019-04-02 DIAGNOSIS — Z01812 Encounter for preprocedural laboratory examination: Secondary | ICD-10-CM | POA: Diagnosis not present

## 2019-04-02 DIAGNOSIS — I082 Rheumatic disorders of both aortic and tricuspid valves: Secondary | ICD-10-CM | POA: Diagnosis not present

## 2019-04-02 DIAGNOSIS — Z803 Family history of malignant neoplasm of breast: Secondary | ICD-10-CM | POA: Insufficient documentation

## 2019-04-02 DIAGNOSIS — M1610 Unilateral primary osteoarthritis, unspecified hip: Secondary | ICD-10-CM | POA: Diagnosis not present

## 2019-04-02 DIAGNOSIS — Z7982 Long term (current) use of aspirin: Secondary | ICD-10-CM | POA: Diagnosis not present

## 2019-04-02 DIAGNOSIS — I110 Hypertensive heart disease with heart failure: Secondary | ICD-10-CM | POA: Insufficient documentation

## 2019-04-02 DIAGNOSIS — Z8042 Family history of malignant neoplasm of prostate: Secondary | ICD-10-CM | POA: Insufficient documentation

## 2019-04-02 DIAGNOSIS — Z79899 Other long term (current) drug therapy: Secondary | ICD-10-CM | POA: Insufficient documentation

## 2019-04-02 DIAGNOSIS — M8580 Other specified disorders of bone density and structure, unspecified site: Secondary | ICD-10-CM | POA: Diagnosis not present

## 2019-04-02 DIAGNOSIS — Z8673 Personal history of transient ischemic attack (TIA), and cerebral infarction without residual deficits: Secondary | ICD-10-CM | POA: Insufficient documentation

## 2019-04-02 DIAGNOSIS — M47819 Spondylosis without myelopathy or radiculopathy, site unspecified: Secondary | ICD-10-CM | POA: Insufficient documentation

## 2019-04-02 HISTORY — DX: Chronic diastolic (congestive) heart failure: I50.32

## 2019-04-02 LAB — BASIC METABOLIC PANEL
Anion gap: 9 (ref 5–15)
BUN: 12 mg/dL (ref 8–23)
CO2: 29 mmol/L (ref 22–32)
Calcium: 10 mg/dL (ref 8.9–10.3)
Chloride: 103 mmol/L (ref 98–111)
Creatinine, Ser: 0.88 mg/dL (ref 0.44–1.00)
GFR calc Af Amer: >60 mL/min (ref >60)
GFR calc non Af Amer: >60 mL/min (ref >60)
Glucose, Bld: 116 mg/dL — ABNORMAL HIGH (ref 70–99)
Potassium: 3.7 mmol/L (ref 3.5–5.1)
Sodium: 141 mmol/L (ref 135–145)

## 2019-04-02 LAB — CBC
HCT: 44 % (ref 36.0–46.0)
Hemoglobin: 13.4 g/dL (ref 12.0–15.0)
MCH: 27.9 pg (ref 26.0–34.0)
MCHC: 30.5 g/dL (ref 30.0–36.0)
MCV: 91.5 fL (ref 80.0–100.0)
Platelets: 214 10*3/uL (ref 150–400)
RBC: 4.81 MIL/uL (ref 3.87–5.11)
RDW: 12.4 % (ref 11.5–15.5)
WBC: 5.4 10*3/uL (ref 4.0–10.5)
nRBC: 0 % (ref 0.0–0.2)

## 2019-04-02 LAB — HEMOGLOBIN A1C
Hgb A1c MFr Bld: 6.1 % — ABNORMAL HIGH (ref 4.8–5.6)
Mean Plasma Glucose: 128.37 mg/dL

## 2019-04-02 NOTE — Progress Notes (Addendum)
Pt scheduled for Covid test on  10/24 No sx of Corona virus. No h/o exposure to Covid positive pts. No h/o recent travel.  PCP - Dr Loura Pardon  Cardiologist - Dr Candee Furbish  Oncology-Dr Phelan placement- 10/27  Chest x-ray - NA  EKG - 02-23-19  Stress Test - denies  ECHO - 11-29-16  Cardiac Cath -11-29-16   AICD-denies PM-denies LOOP-denies  Sleep Study - denies CPAP - NA  LABS-CBC,BMP,A1C  ASA-LD-04/02/19  ERAS-clear liquids  HA1C-denies. Pt mentioned she has had some high bld glucose test in the past. Fasting Blood Sugar -  Checks Blood Sugar _____ times a day  Anesthesia-Y.  H/o stroke,CAD,HTN, CHF  Pt denies having chest pain, sob, or fever at this time. All instructions explained to the pt, with a verbal understanding of the material. Pt agrees to go over the instructions while at home for a better understanding. Pt also instructed to self quarantine after being tested for COVID-19. The opportunity to ask questions was provided.

## 2019-04-03 ENCOUNTER — Other Ambulatory Visit (HOSPITAL_COMMUNITY)
Admission: RE | Admit: 2019-04-03 | Discharge: 2019-04-03 | Disposition: A | Discharge status: Home / Self Care | Payer: Medicare HMO | Source: Ambulatory Visit | Attending: General Surgery | Admitting: General Surgery

## 2019-04-03 DIAGNOSIS — Z20828 Contact with and (suspected) exposure to other viral communicable diseases: Secondary | ICD-10-CM | POA: Insufficient documentation

## 2019-04-03 DIAGNOSIS — Z01812 Encounter for preprocedural laboratory examination: Secondary | ICD-10-CM | POA: Insufficient documentation

## 2019-04-04 LAB — NOVEL CORONAVIRUS, NAA (HOSP ORDER, SEND-OUT TO REF LAB; TAT 18-24 HRS): SARS-CoV-2, NAA: NOT DETECTED

## 2019-04-05 ENCOUNTER — Encounter (HOSPITAL_COMMUNITY): Payer: Self-pay

## 2019-04-05 NOTE — Progress Notes (Signed)
Anesthesia Chart Review:  Case: [id number] Date/Time: 04/07/19 1415   Procedure: RIGHT BREAST LUMPECTOMY WITH RADIOACTIVE SEED LOCALIZATION (Right Breast)   Anesthesia type: General   Pre-op diagnosis: RIGHT BREAST CANCER   Location: [facility] [place] OR   Surgeon: Jovita Kussmaul, MD    RSL seed implant date 04/06/19 per patient report.   DISCUSSION: Patient is an 85 year old female scheduled for the above procedure.  History includes never smoker, right breast cancer (invasive mammay carcinoma 02/2019), HTN, GERD, CAD (presented 11/28/16 with SOB, bigeminy PVCs, troponin 0.06-0.08, 11/29/16 LHC: distal anterolateral wall hypokinesis, 70% proximal small-medium nondominant RCA, minimal irregularities LAD & CX, medical therapy), chronic diastolic CHF, CVA ([id number]), frequent UTI.   Preoperative cardiology input outlined at 02/23/19 evaluation with Dr. Marlou Porch,  "Preoperative risk ratification for breast lumpectomy and back laminectomy - Cardiac catheterization from 2018 personally reviewed as above.  Based upon these findings, she may proceed with both surgeries with moderate overall cardiac risk, based mainly upon age.  She is able to get around without any anginal symptoms no syncope no arrhythmias no bleeding."  Last ASA 04/02/19.  04/03/2019 COVID-19 test negative.  If no acute changes and I would anticipate that she could proceed as planned.   VS: BP (!) 159/69   Pulse 70   Temp (!) 36.2 C   Resp 18   Ht 5\' 2"  (1.575 m)   Wt 66.8 kg   SpO2 100%   BMI 26.92 kg/m    PROVIDERS: [place], Wynelle Fanny, MD is PCP. She cleared patient for breast surgery. Her note also mentions patient needing upcoming lumbar laminectomy as well, so she recommended cardiology pre-operative evaluation (done on 02/23/19).   Nicholas Lose, MD is HEM-ONC - Eppie Gibson, MD is RAD-ONC - Candee Furbish, MD is cardiologist. Last visit 02/23/19 for preoperative evaluation as outlined above. Medical therapy for CAD. She did not  wish to take atorvastatin at that time. PRN cardiology follow-up recommended.   Basil Dess, MD is orthopedic surgeon. Per 01/21/19 note, "Surgery recommended is a two level lumbar decompressive laminectomy with discectomy L2-3 and L3-4 this would be done with a microscope."   LABS: Labs reviewed: Acceptable for surgery. (all labs ordered are listed, but only abnormal results are displayed)  Labs Reviewed  BASIC METABOLIC PANEL - Abnormal; Notable for the following components:      Result Value   Glucose, Bld 116 (*)    All other components within normal limits  HEMOGLOBIN A1C - Abnormal; Notable for the following components:   Hgb A1c MFr Bld 6.1 (*)    All other components within normal limits  CBC     IMAGES: MRI L-spine 10/29/18: IMPRESSION: 1. Multilevel degenerative disease with listhesis that has progressed from 2011 myelogram. 2. L2-3 high-grade spinal stenosis. There is asymmetric left subarticular recess impingement due to a paracentral protrusion. Bilateral foraminal impingement at this level, more advanced on the left. 3. L3-4 moderate bilateral foraminal stenosis. 4. L4-5 PLIF with solid posterolateral arthrodesis.  No impingement.   EKG: 02/23/19 (as interpreted by Dr. Marlou Porch): "normal sinus rhythm 61 with left anterior fascicular block personally reviewed-prior prior on 07/01/2018 shows sinus rhythm with left anterior fascicular block and PVC"   CV: Cardiac cath 11/29/16:  Prox RCA lesion, 70 %stenosed.  The left ventricular ejection fraction is 50-55% by visual estimate.  LV end diastolic pressure is mildly elevated.  There is mild left ventricular systolic dysfunction. 1. Moderate stenosis of a small to  medium caliber nondominant RCA 2. Minimal irregularity of the LAD and left circumflex 3. Mild segmental LV systolic dysfunction with hypokinesis of the distal anterolateral wall, estimated LVEF 50-55% 4. Mildly elevated LV EDP Recommendation: Medical  therapy for nonobstructive CAD   Echo 11/29/16: Study Conclusions - Left ventricle: The cavity size was normal. Systolic function was   normal. The estimated ejection fraction was in the range of 60%   to 65%. Wall motion was normal; there were no regional wall   motion abnormalities. Doppler parameters are consistent with   abnormal left ventricular relaxation (grade 1 diastolic   dysfunction). Doppler parameters are consistent with   indeterminate ventricular filling pressure. - Aortic valve: There was mild regurgitation. Regurgitation   pressure half-time: 608 ms. - Mitral valve: Transvalvular velocity was within the normal range.   There was no evidence for stenosis. There was trivial   regurgitation. - Right ventricle: The cavity size was normal. Wall thickness was   normal. Systolic function was normal. - Atrial septum: No defect or patent foramen ovale was identified. - Tricuspid valve: There was mild regurgitation. - Pulmonic valve: There was moderate regurgitation. - Pulmonary arteries: Systolic pressure was within the normal   range. PA peak pressure: 36 mm Hg (S).   Carotid US 12/27/05: IMPRESSION:   No significant carotid disease identified in the neck. Estimated bilateral ICA stenoses of less than 50% based on velocity criteria.   Past Medical History:  Diagnosis Date  . Allergy   . CAD (coronary artery disease)   . Chronic diastolic (congestive) heart failure ([place])   . Clinical trial participant    [facility] studies in familial dementia  . Dyspnea 11/28/2016  . Family history of breast cancer   . Family history of lung cancer   . Family history of prostate cancer   . Frequent UTI   . GERD (gastroesophageal reflux disease)   . Hypertension   . Osteoarthritis    hips, back  . Osteopenia   . Pneumonia   . Spinal stenosis   . Stroke [facility]) 1985    Past Surgical History:  Procedure Laterality Date  . ABDOMINAL HYSTERECTOMY     partial has one  ovary  . APPENDECTOMY    . CATARACT EXTRACTION Bilateral   . LEFT HEART CATH AND CORONARY ANGIOGRAPHY N/A 11/29/2016   Procedure: Left Heart Cath and Coronary Angiography;  Surgeon: Sherren Mocha, MD;  Location: [place] CV LAB;  Service: Cardiovascular;  Laterality: N/A;  . LUMBAR DISC SURGERY     x 3  . rotator cuff surgery Right 2010    MEDICATIONS: . aspirin EC 81 MG tablet  . Cholecalciferol (VITAMIN D-3) 1000 UNITS CAPS  . diltiazem (CARDIZEM CD) 120 MG 24 hr capsule  . Docusate Sodium (EQ STOOL SOFTENER PO)  . doxylamine, Sleep, (EQ SLEEP AID) 25 MG tablet  . hydrochlorothiazide (HYDRODIURIL) 25 MG tablet  . nitroGLYCERIN (NITROSTAT) 0.4 MG SL tablet  . polyethylene glycol (MIRALAX / GLYCOLAX) packet  . potassium chloride (K-DUR) 10 MEQ tablet  . traMADol (ULTRAM) 50 MG tablet  . vitamin B-12 (CYANOCOBALAMIN) 1000 MCG tablet   No current facility-administered medications for this encounter.      Myra Gianotti, PA-C Surgical Short Stay/Anesthesiology [facility] Phone [phone] [facility] Phone [phone] 04/05/2019 12:56 PM

## 2019-04-05 NOTE — Anesthesia Preprocedure Evaluation (Addendum)
Anesthesia Evaluation  Patient identified by MRN, date of birth, ID band Patient awake    Reviewed: Allergy & Precautions, NPO status , Patient's Chart, lab work & pertinent test results  Airway Mallampati: II  TM Distance: >3 FB Neck ROM: Full    Dental no notable dental hx.    Pulmonary neg pulmonary ROS,    Pulmonary exam normal breath sounds clear to auscultation       Cardiovascular hypertension, Pt. on medications + CAD  Normal cardiovascular exam Rhythm:Regular Rate:Normal     Neuro/Psych CVA negative psych ROS   GI/Hepatic negative GI ROS, Neg liver ROS,   Endo/Other  negative endocrine ROS  Renal/GU negative Renal ROS  negative genitourinary   Musculoskeletal negative musculoskeletal ROS (+)   Abdominal   Peds negative pediatric ROS (+)  Hematology negative hematology ROS (+)   Anesthesia Other Findings   Reproductive/Obstetrics negative OB ROS                            Anesthesia Physical Anesthesia Plan  ASA: III  Anesthesia Plan: General   Post-op Pain Management:    Induction: Intravenous  PONV Risk Score and Plan: 3 and Ondansetron and Treatment may vary due to age or medical condition  Airway Management Planned: LMA  Additional Equipment:   Intra-op Plan:   Post-operative Plan:   Informed Consent: I have reviewed the patients History and Physical, chart, labs and discussed the procedure including the risks, benefits and alternatives for the proposed anesthesia with the patient or authorized representative who has indicated his/her understanding and acceptance.     Dental advisory given  Plan Discussed with: CRNA  Anesthesia Plan Comments: ( )       Anesthesia Quick Evaluation

## 2019-04-06 ENCOUNTER — Encounter: Payer: Self-pay | Admitting: Family Medicine

## 2019-04-06 DIAGNOSIS — C50811 Malignant neoplasm of overlapping sites of right female breast: Secondary | ICD-10-CM | POA: Diagnosis not present

## 2019-04-07 ENCOUNTER — Encounter (HOSPITAL_COMMUNITY)
Admission: RE | Disposition: A | Discharge status: Home / Self Care | Payer: Self-pay | Source: Home / Self Care | Attending: General Surgery

## 2019-04-07 ENCOUNTER — Ambulatory Visit (HOSPITAL_COMMUNITY): Payer: Medicare HMO | Admitting: Registered Nurse

## 2019-04-07 ENCOUNTER — Ambulatory Visit (HOSPITAL_COMMUNITY): Payer: Medicare HMO | Admitting: Vascular Surgery

## 2019-04-07 ENCOUNTER — Other Ambulatory Visit: Payer: Self-pay

## 2019-04-07 ENCOUNTER — Ambulatory Visit (HOSPITAL_COMMUNITY)
Admission: RE | Admit: 2019-04-07 | Discharge: 2019-04-07 | Disposition: A | Discharge status: Home / Self Care | Payer: Medicare HMO | Attending: General Surgery | Admitting: General Surgery

## 2019-04-07 DIAGNOSIS — C50911 Malignant neoplasm of unspecified site of right female breast: Secondary | ICD-10-CM | POA: Diagnosis not present

## 2019-04-07 DIAGNOSIS — I10 Essential (primary) hypertension: Secondary | ICD-10-CM | POA: Insufficient documentation

## 2019-04-07 DIAGNOSIS — Z170 Estrogen receptor positive status [ER+]: Secondary | ICD-10-CM | POA: Insufficient documentation

## 2019-04-07 DIAGNOSIS — Z7982 Long term (current) use of aspirin: Secondary | ICD-10-CM | POA: Diagnosis not present

## 2019-04-07 DIAGNOSIS — C50411 Malignant neoplasm of upper-outer quadrant of right female breast: Secondary | ICD-10-CM | POA: Diagnosis not present

## 2019-04-07 DIAGNOSIS — I2510 Atherosclerotic heart disease of native coronary artery without angina pectoris: Secondary | ICD-10-CM | POA: Diagnosis not present

## 2019-04-07 DIAGNOSIS — Z79899 Other long term (current) drug therapy: Secondary | ICD-10-CM | POA: Diagnosis not present

## 2019-04-07 DIAGNOSIS — Z803 Family history of malignant neoplasm of breast: Secondary | ICD-10-CM | POA: Insufficient documentation

## 2019-04-07 DIAGNOSIS — C50811 Malignant neoplasm of overlapping sites of right female breast: Secondary | ICD-10-CM | POA: Diagnosis not present

## 2019-04-07 HISTORY — PX: BREAST LUMPECTOMY WITH RADIOACTIVE SEED LOCALIZATION: SHX6424

## 2019-04-07 SURGERY — BREAST LUMPECTOMY WITH RADIOACTIVE SEED LOCALIZATION
Anesthesia: General | Site: Breast | Laterality: Right

## 2019-04-07 MED ORDER — EPHEDRINE SULFATE-NACL 50-0.9 MG/10ML-% IV SOSY
PREFILLED_SYRINGE | INTRAVENOUS | Status: DC | PRN
  Administered 2019-04-07 (×2): 10 mg via INTRAVENOUS

## 2019-04-07 MED ORDER — ACETAMINOPHEN 500 MG PO TABS
1000.0000 mg | ORAL_TABLET | ORAL | Status: AC
  Administered 2019-04-07: 13:00:00 1000 mg via ORAL

## 2019-04-07 MED ORDER — BUPIVACAINE-EPINEPHRINE 0.5% -1:200000 IJ SOLN
INTRAMUSCULAR | Status: DC | PRN
  Administered 2019-04-07: 20 mL

## 2019-04-07 MED ORDER — PROPOFOL 500 MG/50ML IV EMUL
INTRAVENOUS | Status: DC | PRN
  Administered 2019-04-07: 100 ug/kg/min via INTRAVENOUS

## 2019-04-07 MED ORDER — FENTANYL CITRATE (PF) 250 MCG/5ML IJ SOLN
INTRAMUSCULAR | Status: AC
  Filled 2019-04-07: qty 5

## 2019-04-07 MED ORDER — PROPOFOL 1000 MG/100ML IV EMUL
INTRAVENOUS | Status: AC
  Filled 2019-04-07: qty 100

## 2019-04-07 MED ORDER — DEXAMETHASONE SODIUM PHOSPHATE 10 MG/ML IJ SOLN
INTRAMUSCULAR | Status: DC | PRN
  Administered 2019-04-07: 10 mg via INTRAVENOUS

## 2019-04-07 MED ORDER — PROPOFOL 10 MG/ML IV BOLUS
INTRAVENOUS | Status: AC
  Filled 2019-04-07: qty 20

## 2019-04-07 MED ORDER — BUPIVACAINE-EPINEPHRINE 0.5% -1:200000 IJ SOLN
INTRAMUSCULAR | Status: AC
  Filled 2019-04-07: qty 1

## 2019-04-07 MED ORDER — PROPOFOL 10 MG/ML IV BOLUS
INTRAVENOUS | Status: DC | PRN
  Administered 2019-04-07 (×2): 50 mg via INTRAVENOUS

## 2019-04-07 MED ORDER — LIDOCAINE 2% (20 MG/ML) 5 ML SYRINGE
INTRAMUSCULAR | Status: DC | PRN
  Administered 2019-04-07: 100 mg via INTRAVENOUS

## 2019-04-07 MED ORDER — HYDROCODONE-ACETAMINOPHEN 5-325 MG PO TABS: 1.0000 | ORAL_TABLET | Freq: Four times a day (QID) | ORAL | 0 refills | Status: DC | PRN

## 2019-04-07 MED ORDER — FENTANYL CITRATE (PF) 100 MCG/2ML IJ SOLN
INTRAMUSCULAR | Status: DC | PRN
  Administered 2019-04-07: 50 ug via INTRAVENOUS

## 2019-04-07 MED ORDER — 0.9 % SODIUM CHLORIDE (POUR BTL) OPTIME
TOPICAL | Status: DC | PRN
  Administered 2019-04-07: 1000 mL

## 2019-04-07 MED ORDER — CHLORHEXIDINE GLUCONATE CLOTH 2 % EX PADS: 6.0000 | MEDICATED_PAD | Freq: Once | CUTANEOUS | Status: DC

## 2019-04-07 MED ORDER — ACETAMINOPHEN 500 MG PO TABS
ORAL_TABLET | ORAL | Status: AC
  Administered 2019-04-07: 1000 mg via ORAL
  Filled 2019-04-07: qty 2

## 2019-04-07 MED ORDER — VANCOMYCIN HCL IN DEXTROSE 1-5 GM/200ML-% IV SOLN
1000.0000 mg | INTRAVENOUS | Status: AC
  Administered 2019-04-07: 13:00:00 1000 mg via INTRAVENOUS

## 2019-04-07 MED ORDER — LACTATED RINGERS IV SOLN
INTRAVENOUS | Status: DC
  Administered 2019-04-07: 14:00:00 via INTRAVENOUS

## 2019-04-07 MED ORDER — ONDANSETRON HCL 4 MG/2ML IJ SOLN
INTRAMUSCULAR | Status: DC | PRN
  Administered 2019-04-07: 4 mg via INTRAVENOUS

## 2019-04-07 SURGICAL SUPPLY — 35 items
ADH SKN CLS APL DERMABOND .7 (GAUZE/BANDAGES/DRESSINGS) ×1
APL PRP STRL LF DISP 70% ISPRP (MISCELLANEOUS) ×1
APPLIER CLIP 9.375 MED OPEN (MISCELLANEOUS)
APR CLP MED 9.3 20 MLT OPN (MISCELLANEOUS)
BINDER BREAST LRG (GAUZE/BANDAGES/DRESSINGS) IMPLANT
BINDER BREAST XLRG (GAUZE/BANDAGES/DRESSINGS) IMPLANT
CANISTER SUCT 3000ML PPV (MISCELLANEOUS) ×3 IMPLANT
CHLORAPREP W/TINT 26 (MISCELLANEOUS) ×3 IMPLANT
CLIP APPLIE 9.375 MED OPEN (MISCELLANEOUS) IMPLANT
COVER PROBE W GEL 5X96 (DRAPES) ×3 IMPLANT
COVER SURGICAL LIGHT HANDLE (MISCELLANEOUS) ×3 IMPLANT
COVER WAND RF STERILE (DRAPES) IMPLANT
DERMABOND ADVANCED (GAUZE/BANDAGES/DRESSINGS) ×2
DERMABOND ADVANCED .7 DNX12 (GAUZE/BANDAGES/DRESSINGS) ×1 IMPLANT
DEVICE DUBIN SPECIMEN MAMMOGRA (MISCELLANEOUS) ×3 IMPLANT
DRAPE CHEST BREAST 15X10 FENES (DRAPES) ×3 IMPLANT
ELECT COATED BLADE 2.86 ST (ELECTRODE) ×3 IMPLANT
ELECT REM PT RETURN 9FT ADLT (ELECTROSURGICAL) ×3
ELECTRODE REM PT RTRN 9FT ADLT (ELECTROSURGICAL) ×1 IMPLANT
GLOVE BIO SURGEON STRL SZ7.5 (GLOVE) ×6 IMPLANT
GOWN STRL REUS W/ TWL LRG LVL3 (GOWN DISPOSABLE) ×2 IMPLANT
GOWN STRL REUS W/TWL LRG LVL3 (GOWN DISPOSABLE) ×6
KIT BASIN OR (CUSTOM PROCEDURE TRAY) ×3 IMPLANT
KIT MARKER MARGIN INK (KITS) ×3 IMPLANT
LIGHT WAVEGUIDE WIDE FLAT (MISCELLANEOUS) IMPLANT
NDL HYPO 25GX1X1/2 BEV (NEEDLE) ×1 IMPLANT
NEEDLE HYPO 25GX1X1/2 BEV (NEEDLE) ×3 IMPLANT
NS IRRIG 1000ML POUR BTL (IV SOLUTION) ×3 IMPLANT
PACK GENERAL/GYN (CUSTOM PROCEDURE TRAY) ×3 IMPLANT
SUT MNCRL AB 4-0 PS2 18 (SUTURE) ×3 IMPLANT
SUT SILK 2 0 SH (SUTURE) IMPLANT
SUT VIC AB 3-0 SH 18 (SUTURE) ×3 IMPLANT
SYR CONTROL 10ML LL (SYRINGE) ×3 IMPLANT
TOWEL GREEN STERILE (TOWEL DISPOSABLE) ×3 IMPLANT
TOWEL GREEN STERILE FF (TOWEL DISPOSABLE) ×3 IMPLANT

## 2019-04-07 NOTE — Op Note (Signed)
04/07/2019  3:20 PM  PATIENT:  Sara [name]  85 y.o. female  PRE-OPERATIVE DIAGNOSIS:  RIGHT BREAST CANCER  POST-OPERATIVE DIAGNOSIS:  RIGHT BREAST CANCER  PROCEDURE:  Procedure(s): RIGHT BREAST LUMPECTOMY WITH RADIOACTIVE SEED LOCALIZATION (Right)  SURGEON:  Surgeon(s) and Role:    * Jovita Kussmaul, MD - Primary  PHYSICIAN ASSISTANT:   ASSISTANTS: none   ANESTHESIA:   local and general  EBL:  10 mL   BLOOD ADMINISTERED:none  DRAINS: none   LOCAL MEDICATIONS USED:  MARCAINE     SPECIMEN:  Source of Specimen:  right breast tissue  DISPOSITION OF SPECIMEN:  PATHOLOGY  COUNTS:  YES  TOURNIQUET:  * No tourniquets in log *  DICTATION: .Dragon Dictation   After informed consent was obtained the patient was brought to the operating room and placed in the supine position on the operating table.  After adequate induction of general anesthesia the patient's right breast was prepped with ChloraPrep, allowed to dry, and draped in usual sterile manner.  An appropriate timeout was performed.  Previously an I-125 seed was placed in the upper portion of the right breast to mark an area of invasive breast cancer.  The neoprobe was set to I-125 in the area of radioactivity was readily identified very superficial to the skin in the upper outer right breast.  The area around this was infiltrated with quarter percent Marcaine.  Because of the superficial position I elected to make an elliptical incision in the skin overlying the cancer.  The incision was carried through the skin and subcutaneous tissue sharply with the electrocautery.  A circular portion of breast tissue was then excised sharply around the radioactive seed while checking the area of radioactivity frequently.  This dissection was carried all the way to the chest wall.  Once the specimen was removed it was oriented with the appropriate paint colors.  A specimen radiograph was obtained that showed the clip and seed to be  near the center of the specimen.  The specimen was then sent to pathology for further evaluation.  Hemostasis was achieved using the Bovie electrocautery.  The wound was irrigated with saline and infiltrated with more quarter percent Marcaine.  The cavity was marked with clips.  The deep layer of the wound was then closed with layers of interrupted 3-0 Vicryl stitches.  The skin was closed with a running 4-0 Monocryl subcuticular stitch.  Dermabond dressings were applied.  The patient tolerated the procedure well.  At the end of the case all needle sponge and instrument counts were correct.  The patient was then awakened and taken to recovery in stable condition.  PLAN OF CARE: Discharge to home after PACU  PATIENT DISPOSITION:  PACU - hemodynamically stable.   Delay start of Pharmacological VTE agent (>24hrs) due to surgical blood loss or risk of bleeding: not applicable

## 2019-04-07 NOTE — Transfer of Care (Signed)
Immediate Anesthesia Transfer of Care Note  Patient: Sara [name]  Procedure(s) Performed: RIGHT BREAST LUMPECTOMY WITH RADIOACTIVE SEED LOCALIZATION (Right Breast)  Patient Location: PACU  Anesthesia Type:General  Level of Consciousness: awake, alert  and oriented  Airway & Oxygen Therapy: Patient Spontanous Breathing  Post-op Assessment: Report given to RN and Post -op Vital signs reviewed and stable  Post vital signs: Reviewed and stable  Last Vitals:  Vitals Value Taken Time  BP 146/79 04/07/19 1546  Temp    Pulse 67 04/07/19 1547  Resp 14 04/07/19 1548  SpO2 98 % 04/07/19 1547  Vitals shown include unvalidated device data.  Last Pain:  Vitals:   04/07/19 1245  TempSrc:   PainSc: 8          Complications: No apparent anesthesia complications

## 2019-04-07 NOTE — Interval H&P Note (Signed)
History and Physical Interval Note:  04/07/2019 12:14 PM  Sara [name]  has presented today for surgery, with the diagnosis of RIGHT BREAST CANCER.  The various methods of treatment have been discussed with the patient and family. After consideration of risks, benefits and other options for treatment, the patient has consented to  Procedure(s): RIGHT BREAST LUMPECTOMY WITH RADIOACTIVE SEED LOCALIZATION (Right) as a surgical intervention.  The patient's history has been reviewed, patient examined, no change in status, stable for surgery.  I have reviewed the patient's chart and labs.  Questions were answered to the patient's satisfaction.     [name]

## 2019-04-07 NOTE — Anesthesia Procedure Notes (Signed)
Procedure Name: LMA Insertion Date/Time: 04/07/2019 2:31 PM Performed by: Trinna Post., CRNA Pre-anesthesia Checklist: Patient identified, Emergency Drugs available, Suction available, Patient being monitored and Timeout performed Patient Re-evaluated:Patient Re-evaluated prior to induction Oxygen Delivery Method: Circle system utilized Preoxygenation: Pre-oxygenation with 100% oxygen Induction Type: IV induction LMA: LMA inserted LMA Size: 4.0 Number of attempts: 1 Placement Confirmation: positive ETCO2 and breath sounds checked- equal and bilateral Tube secured with: Tape Dental Injury: Teeth and Oropharynx as per pre-operative assessment

## 2019-04-07 NOTE — Interval H&P Note (Signed)
History and Physical Interval Note:  04/07/2019 1:40 PM  Sara [name]  has presented today for surgery, with the diagnosis of RIGHT BREAST CANCER.  The various methods of treatment have been discussed with the patient and family. After consideration of risks, benefits and other options for treatment, the patient has consented to  Procedure(s): RIGHT BREAST LUMPECTOMY WITH RADIOACTIVE SEED LOCALIZATION (Right) as a surgical intervention.  The patient's history has been reviewed, patient examined, no change in status, stable for surgery.  I have reviewed the patient's chart and labs.  Questions were answered to the patient's satisfaction.     [name]

## 2019-04-07 NOTE — H&P (Addendum)
Sara [name]  Location: [facility] Patient #: [phone] DOB: 1931/11/12 Married / Language: English / Race: Black or African American Female   History of Present Illness  The patient is a 85 year old female who presents with breast cancer. We are asked to see the patient in consultation by Dr. Lear Ng to evaluate her for a new right breast cancer. The patient is a 85 year old black female who recently went for a routine screening mammogram. At that time she was found to have a 1.2 cm mass in the upper portion of the right breast. The axilla was negative. This mass was biopsied and came back as an invasive ductal type of breast cancer that was ER and PR positive although the PR was weak and HER-2 negative with a Ki-67 of 10%. She does not smoke. She does have a significant family history of breast cancer in 2 sisters and a daughter   Allergies  Amoxicillin *PENICILLINS*  Aspirin *ANALGESICS - NonNarcotic*  Clarithromycin *CHEMICALS*  Furosemide *DIURETICS*  Gabapentin *ANTICONVULSANTS*  Allergies Reconciled   Medication History  HydroCHLOROthiazide (25MG Tablet, Oral) Active. Clotrimazole-Betamethasone (1-0.05% Cream, External) Active. RaNITidine HCl (150MG Capsule, Oral) Active. Tolterodine Tartrate (2MG Tablet, Oral) Active. MiraLax (Oral) Active. Colace (100MG Capsule, Oral) Active. Klor-Con M10 (10MEQ Tablet ER, Oral) Active. Mometasone Furoate (0.1% Cream, External) Active. Medications Reconciled    Review of Systems  General Not Present- Appetite Loss, Chills, Fatigue, Fever, Night Sweats, Weight Gain and Weight Loss. Note: All other systems negative (unless as noted in HPI & included Review of Systems) Skin Not Present- Change in Wart/Mole, Dryness, Hives, Jaundice, New Lesions, Non-Healing Wounds, Rash and Ulcer. HEENT Not Present- Earache, Hearing Loss, Hoarseness, Nose Bleed, Oral Ulcers, Ringing in the Ears, Seasonal  Allergies, Sinus Pain, Sore Throat, Visual Disturbances, Wears glasses/contact lenses and Yellow Eyes. Respiratory Not Present- Bloody sputum, Chronic Cough, Difficulty Breathing, Snoring and Wheezing. Breast Not Present- Breast Mass, Breast Pain, Nipple Discharge and Skin Changes. Cardiovascular Not Present- Chest Pain, Difficulty Breathing Lying Down, Leg Cramps, Palpitations, Rapid Heart Rate, Shortness of Breath and Swelling of Extremities. Gastrointestinal Not Present- Abdominal Pain, Bloating, Bloody Stool, Change in Bowel Habits, Chronic diarrhea, Constipation, Difficulty Swallowing, Excessive gas, Gets full quickly at meals, Hemorrhoids, Indigestion, Nausea, Rectal Pain and Vomiting. Female Genitourinary Not Present- Frequency, Nocturia, Painful Urination, Pelvic Pain and Urgency. Musculoskeletal Not Present- Back Pain, Joint Pain, Joint Stiffness, Muscle Pain, Muscle Weakness and Swelling of Extremities. Neurological Not Present- Decreased Memory, Fainting, Headaches, Numbness, Seizures, Tingling, Tremor, Trouble walking and Weakness. Psychiatric Not Present- Anxiety, Bipolar, Change in Sleep Pattern, Depression, Fearful and Frequent crying. Endocrine Not Present- Cold Intolerance, Excessive Hunger, Hair Changes, Heat Intolerance, Hot flashes and New Diabetes. Hematology Not Present- Easy Bruising, Excessive bleeding, Gland problems, HIV and Persistent Infections.  Vitals  Weight: 147 lb Height: 67in Body Surface Area: 1.77 m Body Mass Index: 23.02 kg/m  Temp.: 99.78F(Oral)  Pulse: 69 (Regular)  BP: 130/84 (Sitting, Left Arm, Standard)       Physical Exam General Mental Status-Alert. General Appearance-Consistent with stated age. Hydration-Well hydrated. Voice-Normal.  Head and Neck Head-normocephalic, atraumatic with no lesions or palpable masses. Trachea-midline. Thyroid Gland Characteristics - normal size and consistency.  Eye Eyeball -  Bilateral-Extraocular movements intact. Sclera/Conjunctiva - Bilateral-No scleral icterus.  Chest and Lung Exam Chest and lung exam reveals -quiet, even and easy respiratory effort with no use of accessory muscles and on auscultation, normal breath sounds, no adventitious sounds and normal vocal resonance. Inspection  Chest Wall - Normal. Back - normal.  Breast Note: There is a 2 cm palpable bruise in the upper portion of the right breast. Other than this there is no other palpable mass in either breast. There is no palpable axillary, supraclavicular, or cervical lymphadenopathy.   Cardiovascular Cardiovascular examination reveals -normal heart sounds, regular rate and rhythm with no murmurs and normal pedal pulses bilaterally.  Abdomen Inspection Inspection of the abdomen reveals - No Hernias. Skin - Scar - no surgical scars. Palpation/Percussion Palpation and Percussion of the abdomen reveal - Soft, Non Tender, No Rebound tenderness, No Rigidity (guarding) and No hepatosplenomegaly. Auscultation Auscultation of the abdomen reveals - Bowel sounds normal.  Neurologic Neurologic evaluation reveals -alert and oriented x 3 with no impairment of recent or remote memory. Mental Status-Normal.  Musculoskeletal Normal Exam - Left-Upper Extremity Strength Normal and Lower Extremity Strength Normal. Normal Exam - Right-Upper Extremity Strength Normal and Lower Extremity Strength Normal.  Lymphatic Head & Neck  General Head & Neck Lymphatics: Bilateral - Description - Normal. Axillary  General Axillary Region: Bilateral - Description - Normal. Tenderness - Non Tender. Femoral & Inguinal  Generalized Femoral & Inguinal Lymphatics: Bilateral - Description - Normal. Tenderness - Non Tender.    Assessment & Plan  MALIGNANT NEOPLASM OF UPPER-OUTER QUADRANT OF RIGHT BREAST IN FEMALE, ESTROGEN RECEPTOR POSITIVE (C50.411) Impression: The patient appears to have a small  stage I cancer in the upper outer quadrant of the right breast. I have talked to her in detail about the options for treatment and at this point she favors breast conservation. I think this is a very reasonable way of treating her cancer. Given her age I do not think she will need a node evaluation. I will plan for a right breast radioactive seed localized lumpectomy. I have discussed with her in detail the risks and benefits of the operation as well as some of the technical aspects and she understands and wishes to proceed. I will also refer her to medical and radiation oncology to discuss adjuvant therapy. I will also refer her to genetics given her strong family history. She also has an appointment next week with her cardiologist and we will go ahead and get cardiac clearance at the same time. She also has some significant radiating back pain and will need to have back surgery in the near future. I have talked her about trying to delay the breast surgery to get her back fix but she would like to have the breast done first Current Plans Referred to Oncology, for evaluation and follow up (Oncology). Routine. Referred to Genetic Counseling, for evaluation and follow up [employer]). Routine. Pt Education - Breast Cancer: discussed with patient and provided information.

## 2019-04-08 ENCOUNTER — Encounter (HOSPITAL_COMMUNITY): Payer: Self-pay | Admitting: General Surgery

## 2019-04-08 NOTE — Addendum Note (Signed)
Addendum  created 04/08/19 1524 by Trinna Post., CRNA   Charge Capture section accepted

## 2019-04-08 NOTE — Anesthesia Postprocedure Evaluation (Signed)
Anesthesia Post Note  Patient: Sara [name]  Procedure(s) Performed: RIGHT BREAST LUMPECTOMY WITH RADIOACTIVE SEED LOCALIZATION (Right Breast)     Patient location during evaluation: PACU Anesthesia Type: General Level of consciousness: awake and alert Pain management: pain level controlled Vital Signs Assessment: post-procedure vital signs reviewed and stable Respiratory status: spontaneous breathing, nonlabored ventilation, respiratory function stable and patient connected to nasal cannula oxygen Cardiovascular status: blood pressure returned to baseline and stable Postop Assessment: no apparent nausea or vomiting Anesthetic complications: no    Last Vitals:  Vitals:   04/07/19 1601 04/07/19 1616  BP: (!) 154/77 (!) 151/78  Pulse: 65 63  Resp: 19 19  Temp:    SpO2: 94% 95%    Last Pain:  Vitals:   04/07/19 1616  TempSrc:   PainSc: Asleep   Pain Goal:                   [name]

## 2019-04-09 LAB — SURGICAL PATHOLOGY

## 2019-04-12 ENCOUNTER — Encounter: Payer: Self-pay | Admitting: *Deleted

## 2019-04-12 NOTE — Progress Notes (Signed)
Patient Care Team: Tower, Wynelle Fanny, MD as PCP - General Marlou Porch Thana Farr, MD as PCP - Cardiology (Cardiology) Mauro Kaufmann, RN as Oncology Nurse Navigator Rockwell Germany, RN as Oncology Nurse Navigator  DIAGNOSIS:    ICD-10-CM   1. Carcinoma of upper-outer quadrant of right breast in female, estrogen receptor positive ([place])  C50.411    Z17.0     SUMMARY OF ONCOLOGIC HISTORY: Oncology History  Carcinoma of upper-outer quadrant of right breast in female, estrogen receptor positive ([place])  02/23/2019 Initial Diagnosis   Routine screening mammogram detected a 1.2cm mass in the right breast. Biopsy showed invasive mammary carcinoma with mammary carcinoma in situ, HER-2 - (1+), ER+ 100%, PR+ 5% Ki67 2%.    02/23/2019 Cancer Staging   Staging form: Breast, AJCC 8th Edition - Clinical stage from 02/23/2019: cT1c, cN0, cM0, GX, ER+, PR+, HER2- - Signed by Eppie Gibson, MD on 02/23/2019   04/07/2019 Surgery   Right lumpectomy Marlou Starks): IDC, grade 2, 1.3cm, with intermediate grade DCIS, clear margins.     CHIEF COMPLIANT: Follow-up s/p lumpectomy to review pathology report  INTERVAL HISTORY: Sara [name] is a 85 y.o. with above-mentioned history of right breast cancer. She underwent a lumpectomy on 04/07/19 with Dr. Marlou Starks for which pathology showed invasive ductal carcinoma, grade 2, 1.3cm, with intermediate grade DCIS, clear margins. She presents to the clinic today to discuss the pathology report and further treatment.   REVIEW OF SYSTEMS:   Constitutional: Denies fevers, chills or abnormal weight loss Eyes: Denies blurriness of vision Ears, nose, mouth, throat, and face: Denies mucositis or sore throat Respiratory: Denies cough, dyspnea or wheezes Cardiovascular: Denies palpitation, chest discomfort Gastrointestinal: Denies nausea, heartburn or change in bowel habits Skin: Denies abnormal skin rashes Lymphatics: Denies new lymphadenopathy or easy bruising Neurological: Denies  numbness, tingling or new weaknesses Behavioral/Psych: Mood is stable, no new changes  Extremities: No lower extremity edema Breast: denies any pain or lumps or nodules in either breasts All other systems were reviewed with the patient and are negative.  I have reviewed the past medical history, past surgical history, social history and family history with the patient and they are unchanged from previous note.  ALLERGIES:  is allergic to amoxicillin; atorvastatin; cefpodoxime proxetil; clarithromycin; fosamax [alendronate sodium]; furosemide; and gabapentin.  MEDICATIONS:  Current Outpatient Medications  Medication Sig Dispense Refill  . [START ON 05/10/2019] anastrozole (ARIMIDEX) 1 MG tablet Take 1 tablet (1 mg total) by mouth daily. 90 tablet 3  . aspirin EC 81 MG tablet Take 81 mg by mouth daily.      . Cholecalciferol (VITAMIN D-3) 1000 UNITS CAPS Take 1,000 Units by mouth daily.     Marland Kitchen diltiazem (CARDIZEM CD) 120 MG 24 hr capsule TAKE 1 CAPSULE (120 MG TOTAL) BY MOUTH DAILY. 90 capsule 1  . Docusate Sodium (EQ STOOL SOFTENER PO) Take 100 mg by mouth daily as needed (constipation).     Marland Kitchen doxylamine, Sleep, (EQ SLEEP AID) 25 MG tablet Take 25 mg by mouth at bedtime as needed for sleep.     . hydrochlorothiazide (HYDRODIURIL) 25 MG tablet TAKE 1 TABLET (25 MG TOTAL) BY MOUTH DAILY. 90 tablet 1  . HYDROcodone-acetaminophen (NORCO/VICODIN) 5-325 MG tablet Take 1-2 tablets by mouth every 6 (six) hours as needed for moderate pain or severe pain. 10 tablet 0  . nitroGLYCERIN (NITROSTAT) 0.4 MG SL tablet Place 1 tablet (0.4 mg total) under the tongue every 5 (five) minutes as needed for chest  pain (max 3 doses in 15 minutes). 25 tablet 3  . polyethylene glycol (MIRALAX / GLYCOLAX) packet Take 17 g by mouth daily.     . potassium chloride (K-DUR) 10 MEQ tablet TAKE 1 TABLET (10 MEQ TOTAL) BY MOUTH DAILY. 90 tablet 1  . traMADol (ULTRAM) 50 MG tablet Take 1 tablet (50 mg total) by mouth every 6  (six) hours as needed. 30 tablet 0  . vitamin B-12 (CYANOCOBALAMIN) 1000 MCG tablet Take 1,000 mcg by mouth daily.     No current facility-administered medications for this visit.     PHYSICAL EXAMINATION: ECOG PERFORMANCE STATUS: 1 - Symptomatic but completely ambulatory  Vitals:   04/13/19 1201  BP: (!) 149/83  Pulse: 60  Resp: 17  Temp: 98.7 F (37.1 C)  SpO2: 100%   Filed Weights   04/13/19 1201  Weight: 146 lb 4.8 oz (66.4 kg)    GENERAL: alert, no distress and comfortable SKIN: skin color, texture, turgor are normal, no rashes or significant lesions EYES: normal, Conjunctiva are pink and non-injected, sclera clear OROPHARYNX: no exudate, no erythema and lips, buccal mucosa, and tongue normal  NECK: supple, thyroid normal size, non-tender, without nodularity LYMPH: no palpable lymphadenopathy in the cervical, axillary or inguinal LUNGS: clear to auscultation and percussion with normal breathing effort HEART: regular rate & rhythm and no murmurs and no lower extremity edema ABDOMEN: abdomen soft, non-tender and normal bowel sounds MUSCULOSKELETAL: no cyanosis of digits and no clubbing  NEURO: alert & oriented x 3 with fluent speech, no focal motor/sensory deficits EXTREMITIES: No lower extremity edema  LABORATORY DATA:  I have reviewed the data as listed CMP Latest Ref Rng & Units 04/02/2019 01/05/2019 07/20/2018  Glucose 70 - 99 mg/dL 116(H) 119(H) 89  BUN 8 - 23 mg/dL _0 Creatinine 0.44 - 1.00 mg/dL 0.88 0.84 0.87  Sodium 135 - 145 mmol/L 141 140 139  Potassium 3.5 - 5.1 mmol/L 3.7 3.9 3.7  Chloride 98 - 111 mmol/L 103 104 102  CO2 22 - 32 mmol/L _1 Calcium 8.9 - 10.3 mg/dL 10.0 9.9 10.0  Total Protein 6.0 - 8.3 g/dL - 6.5 7.3  Total Bilirubin 0.2 - 1.2 mg/dL - 0.7 0.5  Alkaline Phos 39 - 117 U/L - 56 69  AST 0 - 37 U/L - 21 16  ALT 0 - 35 U/L - 12 10    Lab Results  Component Value Date   WBC 5.4 04/02/2019   HGB 13.4 04/02/2019   HCT 44.0  04/02/2019   MCV 91.5 04/02/2019   PLT 214 04/02/2019   NEUTROABS 3.3 01/05/2019    ASSESSMENT & PLAN:  Carcinoma of upper-outer quadrant of right breast in female, estrogen receptor positive ([place]) 04/25/2019:Routine screening mammogram detected a 1.2cm mass in the right breast. Biopsy showed invasive mammary carcinoma with mammary carcinoma in situ, HER-2 - (1+), ER+ 100%, PR+ 5% Ki67 2%.  T1c N0 stage Ia clinical stage  Recommendations: 1.  Right lumpectomy 04/07/2019: Grade 2 IDC 1.3 cm with intermediate grade DCIS, margins negative, negative for lymphovascular or perineural invasion, ER 100%, PR 5%, HER-2 -1+, Ki-67 2%, T1c Nx stage Ia 2.  +/- Adjuvant radiation therapy  3. Adjuvant antiestrogen therapy We will discuss her case in the tumor conference and make a final decision regarding adjuvant radiation. I gave her a prescription for anastrozole. I recommended that she take bisphosphonates because of her history of osteoporosis. Return to clinic after radiation to start antiestrogen  therapy     No orders of the defined types were placed in this encounter.  The patient has a good understanding of the overall plan. she agrees with it. she will call with any problems that may develop before the next visit here.  Nicholas Lose, MD 04/13/2019  Julious Oka Dorshimer am acting as scribe for Dr. Nicholas Lose.  I have reviewed the above documentation for accuracy and completeness, and I agree with the above.

## 2019-04-13 ENCOUNTER — Inpatient Hospital Stay: Payer: Medicare HMO | Attending: Hematology and Oncology | Admitting: Hematology and Oncology

## 2019-04-13 ENCOUNTER — Other Ambulatory Visit: Payer: Self-pay

## 2019-04-13 ENCOUNTER — Telehealth: Payer: Self-pay | Admitting: Adult Health

## 2019-04-13 DIAGNOSIS — C50411 Malignant neoplasm of upper-outer quadrant of right female breast: Secondary | ICD-10-CM | POA: Diagnosis not present

## 2019-04-13 DIAGNOSIS — Z79899 Other long term (current) drug therapy: Secondary | ICD-10-CM | POA: Insufficient documentation

## 2019-04-13 DIAGNOSIS — Z7982 Long term (current) use of aspirin: Secondary | ICD-10-CM | POA: Diagnosis not present

## 2019-04-13 DIAGNOSIS — Z170 Estrogen receptor positive status [ER+]: Secondary | ICD-10-CM | POA: Insufficient documentation

## 2019-04-13 MED ORDER — ANASTROZOLE 1 MG PO TABS: 1.0000 mg | ORAL_TABLET | Freq: Every day | ORAL | 3 refills | Status: DC

## 2019-04-13 NOTE — Telephone Encounter (Signed)
I talk with patient regarding schedule  

## 2019-04-13 NOTE — Assessment & Plan Note (Signed)
04/25/2019:Routine screening mammogram detected a 1.2cm mass in the right breast. Biopsy showed invasive mammary carcinoma with mammary carcinoma in situ, HER-2 - (1+), ER+ 100%, PR+ 5% Ki67 2%.  T1c N0 stage Ia clinical stage  Recommendations: 1.  Right lumpectomy 04/07/2019: Grade 2 IDC 1.3 cm with intermediate grade DCIS, margins negative, negative for lymphovascular or perineural invasion, ER 100%, PR 5%, HER-2 -1+, Ki-67 2%, T1c Nx stage Ia 2.  Adjuvant radiation therapy based on patient's personal preference 3. Adjuvant antiestrogen therapy  Return to clinic after radiation to start antiestrogen therapy

## 2019-04-18 ENCOUNTER — Telehealth: Payer: Self-pay | Admitting: Family Medicine

## 2019-04-18 NOTE — Telephone Encounter (Signed)
Please let pt know that both her oncologist and I are interested in starting a medication called Prolia for her osteoporosis  I would like to start the process of checking coverage if she is interested She has h/o breast cancer

## 2019-04-19 NOTE — Telephone Encounter (Signed)
Pt notified of Dr. Marliss Coots comments. Pt is okay with [place] starting the process to see how much it would cost her. She said if it's expensive she can't do it. Will route this message to PCP so she is aware and also [place]arm[place] to start the process of checking coverage

## 2019-04-23 NOTE — Progress Notes (Signed)
Location of Breast Cancer: Right Breast  Histology per Pathology Report:  02/10/19 Diagnosis Breast, right, needle core biopsy, 12 o'clock, 7cmfn - INVASIVE MAMMARY CARCINOMA. - MAMMARY CARCINOMA IN SITU. - SEE MICROSCOPIC DESCRIPTION.  Receptor Status: ER(100%), PR (5%), Her2-neu (NEG), Ki-(2%)  04/07/19 FINAL MICROSCOPIC DIAGNOSIS: A. 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   Past/Anticipated interventions by surgeon, if any: PROCEDURE:  Procedure(s): RIGHT BREAST LUMPECTOMY WITH RADIOACTIVE SEED LOCALIZATION (Right) SURGEON:  Surgeon(s) and Role:    Jovita Kussmaul, MD - Primary  Past/Anticipated interventions by medical oncology: 04/13/19 Dr. Lindi Adie  Recommendations: 1.  Right lumpectomy 04/07/2019: Grade 2 IDC 1.3 cm with intermediate grade DCIS, margins negative, negative for lymphovascular or perineural invasion, ER 100%, PR 5%, HER-2 -1+, Ki-67 2%, T1c Nx stage Ia 2.+/- Adjuvant radiation therapy  3. Adjuvant antiestrogen therapy We will discuss her case in the tumor conference and make a final decision regarding adjuvant radiation. I gave her a prescription for anastrozole. I recommended that she take bisphosphonates because of her history of osteoporosis. Return to clinic after radiation to start antiestrogen therapy Lymphedema issues, if any:    Pain issues, if any:  She reports chronic pain due to a pinched nerve.   SAFETY ISSUES:  Prior radiation? No  Pacemaker/ICD? No  Possible current pregnancy? No  Is the patient on methotrexate?  No  Current Complaints / other details:

## 2019-04-28 ENCOUNTER — Ambulatory Visit
Admission: RE | Admit: 2019-04-28 | Discharge: 2019-04-28 | Disposition: A | Discharge status: Home / Self Care | Payer: Medicare HMO | Source: Ambulatory Visit | Attending: Adult Health | Admitting: Adult Health

## 2019-04-28 ENCOUNTER — Ambulatory Visit
Admission: RE | Admit: 2019-04-28 | Discharge: 2019-04-28 | Disposition: A | Discharge status: Home / Self Care | Payer: Medicare HMO | Source: Ambulatory Visit | Attending: Radiation Oncology | Admitting: Radiation Oncology

## 2019-04-28 ENCOUNTER — Other Ambulatory Visit: Payer: Self-pay

## 2019-04-28 ENCOUNTER — Encounter: Payer: Self-pay | Admitting: Radiation Oncology

## 2019-04-28 DIAGNOSIS — C50811 Malignant neoplasm of overlapping sites of right female breast: Secondary | ICD-10-CM

## 2019-04-28 DIAGNOSIS — C50411 Malignant neoplasm of upper-outer quadrant of right female breast: Secondary | ICD-10-CM | POA: Diagnosis not present

## 2019-04-28 DIAGNOSIS — Z9889 Other specified postprocedural states: Secondary | ICD-10-CM | POA: Diagnosis not present

## 2019-04-28 DIAGNOSIS — Z170 Estrogen receptor positive status [ER+]: Secondary | ICD-10-CM | POA: Diagnosis not present

## 2019-04-28 NOTE — Progress Notes (Signed)
Radiation Oncology         [phone] ________________________________  Name: Sara [name] MRN: [id number]  Date: 04/28/2019  DOB: 06-Nov-1931  Telephone Follow-Up Note by telephone as patient was unable to access MyChart video during pandemic precautions   Outpatient  CC: Tower, Wynelle Fanny, MD  Tower, Wynelle Fanny, MD  Diagnosis:      ICD-10-CM   1. Carcinoma of upper-outer quadrant of right breast in female, estrogen receptor positive ([place])  C50.411    Z17.0      Cancer Staging Carcinoma of upper-outer quadrant of right breast in female, estrogen receptor positive ([place]) Staging form: Breast, AJCC 8th Edition - Clinical stage from 02/23/2019: cT1c, cN0, cM0, GX, ER+, PR+, HER2- - Signed by Eppie Gibson, MD on 02/23/2019   CHIEF COMPLAINT: Here to discuss management of right breast cancer  Narrative:  The patient returns today for follow-up to discuss radiation treatment options. She was seen in consultation on 02/23/2019.    She opted to proceed with right lumpectomy on date of 04/07/2019 with pathology report revealing: tumor size of 1.3cm; histology of ductal carcinoma; margin status to invasive disease of >4 mm and margin status to in situ disease of >5 mm; Grade 2. Prognostic panel was not repeated.  No nodes were removed  She saw Dr. Lindi Adie for follow up on 04/13/2019. Her case was further discussed in the multidisciplinary tumor conference.  Symptomatically, the patient reports: doing well. She is independent with her ADLs, lives alone "with the [place]."  Of note, she has declined genetic testing at this time.  Her mom lived to be 52. Her father lived to be in his 38s.         ALLERGIES:  is allergic to amoxicillin; atorvastatin; cefpodoxime proxetil; clarithromycin; fosamax [alendronate sodium]; furosemide; and gabapentin.  Meds: Current Outpatient Medications  Medication Sig Dispense Refill  . aspirin EC 81 MG tablet Take 81 mg by mouth daily.      .  Cholecalciferol (VITAMIN D-3) 1000 UNITS CAPS Take 1,000 Units by mouth daily.     Marland Kitchen diltiazem (CARDIZEM CD) 120 MG 24 hr capsule TAKE 1 CAPSULE (120 MG TOTAL) BY MOUTH DAILY. 90 capsule 1  . Docusate Sodium (EQ STOOL SOFTENER PO) Take 100 mg by mouth daily as needed (constipation).     Marland Kitchen doxylamine, Sleep, (EQ SLEEP AID) 25 MG tablet Take 25 mg by mouth at bedtime as needed for sleep.     . hydrochlorothiazide (HYDRODIURIL) 25 MG tablet TAKE 1 TABLET (25 MG TOTAL) BY MOUTH DAILY. 90 tablet 1  . nitroGLYCERIN (NITROSTAT) 0.4 MG SL tablet Place 1 tablet (0.4 mg total) under the tongue every 5 (five) minutes as needed for chest pain (max 3 doses in 15 minutes). 25 tablet 3  . polyethylene glycol (MIRALAX / GLYCOLAX) packet Take 17 g by mouth daily.     . potassium chloride (K-DUR) 10 MEQ tablet TAKE 1 TABLET (10 MEQ TOTAL) BY MOUTH DAILY. 90 tablet 1  . traMADol (ULTRAM) 50 MG tablet Take 1 tablet (50 mg total) by mouth every 6 (six) hours as needed. 30 tablet 0  . vitamin B-12 (CYANOCOBALAMIN) 1000 MCG tablet Take 1,000 mcg by mouth daily.    Derrill Memo ON 05/10/2019] anastrozole (ARIMIDEX) 1 MG tablet Take 1 tablet (1 mg total) by mouth daily. (Patient not taking: Reported on 04/28/2019) 90 tablet 3  . HYDROcodone-acetaminophen (NORCO/VICODIN) 5-325 MG tablet Take 1-2 tablets by mouth every 6 (six) hours as needed for moderate  pain or severe pain. (Patient not taking: Reported on 04/28/2019) 10 tablet 0   No current facility-administered medications for this encounter.     Physical Findings:  vitals were not taken for this visit. .     General: Alert and oriented, in no acute distress   Lab Findings: Lab Results  Component Value Date   WBC 5.4 04/02/2019   HGB 13.4 04/02/2019   HCT 44.0 04/02/2019   MCV 91.5 04/02/2019   PLT 214 04/02/2019    Radiographic Findings: No results found.  Impression/Plan: Right Breast Cancer  We again reviewed the risks benefits and side effects of  adjuvant radiotherapy.  She understands that she has a good prognosis with antiestrogens alone.  We discussed the improvement in locoregional control that can be obtained with adding radiotherapy to the breast.  I would recommend a 3-week course of radiotherapy to the breast and axilla with high tangents if she pursues it.  She understands that radiotherapy would not improve her life expectancy.  Given that her parents lived to be into their 61s, and her mom specifically lived to be 80, and given that the patient is in reasonably good health, she would like to be a bit more aggressive in her treatment than taking antiestrogens alone.  We will schedule her for CT simulation in the near future.  She will hold antiestrogen therapy until 1 month after radiation is complete  The risks, benefits and side effects of this treatment were discussed in detail.  She understands that radiotherapy is associated with skin irritation and fatigue in the acute setting. Late effects can include cosmetic changes and rare injury to internal organs.  She is enthusiastic about proceeding with treatment.    This encounter was provided by telemedicine platform by telephone as patient was unable to access MyChart video during pandemic precautions The patient has given verbal consent for this type of encounter and has been advised to only accept a meeting of this type in a secure network environment. The time spent during this encounter was 15 minutes. The attendants for this meeting include Eppie Gibson  and Renato Shin.  During the encounter, Eppie Gibson was located at [facility] Radiation Oncology Department.  Renato Shin was located at home.   _____________________________________   Eppie Gibson, MD   This document serves as a record of services personally performed by Eppie Gibson, MD. It was created on her behalf by Wilburn Mylar, a trained medical scribe. The creation of this  record is based on the scribe's personal observations and the provider's statements to them. This document has been checked and approved by the attending provider.

## 2019-04-30 ENCOUNTER — Ambulatory Visit
Admission: RE | Admit: 2019-04-30 | Discharge: 2019-04-30 | Disposition: A | Discharge status: Home / Self Care | Payer: Medicare HMO | Source: Ambulatory Visit | Attending: Radiation Oncology | Admitting: Radiation Oncology

## 2019-04-30 ENCOUNTER — Other Ambulatory Visit: Payer: Self-pay

## 2019-04-30 ENCOUNTER — Encounter: Payer: Self-pay | Admitting: Radiation Oncology

## 2019-04-30 DIAGNOSIS — C50411 Malignant neoplasm of upper-outer quadrant of right female breast: Secondary | ICD-10-CM | POA: Diagnosis not present

## 2019-04-30 DIAGNOSIS — C50811 Malignant neoplasm of overlapping sites of right female breast: Secondary | ICD-10-CM | POA: Diagnosis not present

## 2019-04-30 DIAGNOSIS — Z170 Estrogen receptor positive status [ER+]: Secondary | ICD-10-CM | POA: Diagnosis not present

## 2019-04-30 DIAGNOSIS — Z510 Encounter for antineoplastic radiation therapy: Secondary | ICD-10-CM | POA: Diagnosis not present

## 2019-04-30 NOTE — Progress Notes (Signed)
  Radiation Oncology         [phone] ________________________________  Name: Sara [name] MRN: [id number]  Date: 04/30/2019  DOB: 24-Jul-1931  SIMULATION AND TREATMENT PLANNING NOTE    outpatient  DIAGNOSIS:     ICD-10-CM   1. Carcinoma of upper-outer quadrant of right breast in female, estrogen receptor positive ([place])  C50.411    Z17.0     NARRATIVE:  The patient was brought to the [place].  Identity was confirmed.  All relevant records and images related to the planned course of therapy were reviewed.  The patient freely provided informed written consent to proceed with treatment after reviewing the details related to the planned course of therapy. The consent form was witnessed and verified by the simulation staff.    Then, the patient was set-up in a stable reproducible supine position for radiation therapy with her ipsilateral arm over her head, and her upper body secured in a custom-made Vac-lok device.  CT images were obtained.  Surface markings were placed.  The CT images were loaded into the planning software.    TREATMENT PLANNING NOTE: Treatment planning then occurred.  The radiation prescription was entered and confirmed.     A total of 3 medically necessary complex treatment devices were fabricated and supervised by me: 2 fields with MLCs for custom blocks to protect heart, and lungs;  and, a Vac-lok. MORE COMPLEX DEVICES MAY BE MADE IN DOSIMETRY FOR FIELD IN FIELD BEAMS FOR DOSE HOMOGENEITY.  I have requested : 3D Simulation which is medically necessary to give adequate dose to at risk tissues while sparing lungs and heart.  I have requested a DVH of the following structures: lungs, heart, right lumpectomy cavity.    The patient will receive 40.05 Gy in 15 fractions to the right breast with 2 tangential fields.   This will not be followed by a boost.  Optical Surface Tracking Plan:  Since intensity modulated radiotherapy (IMRT) and 3D  conformal radiation treatment methods are predicated on accurate and precise positioning for treatment, intrafraction motion monitoring is medically necessary to ensure accurate and safe treatment delivery. The ability to quantify intrafraction motion without excessive ionizing radiation dose can only be performed with optical surface tracking. Accordingly, surface imaging offers the opportunity to obtain 3D measurements of patient position throughout IMRT and 3D treatments without excessive radiation exposure. I am ordering optical surface tracking for this patient's upcoming course of radiotherapy.  ________________________________   Reference:  Ursula Alert, J, et al. Surface imaging-based analysis of intrafraction motion for breast radiotherapy patients.Journal of [place], n. 6, nov. 2014. ISSN [id number].  Available at: <http://www.jacmp.org/index.php/jacmp/article/view/4957>.    -----------------------------------  Eppie Gibson, MD

## 2019-05-04 ENCOUNTER — Telehealth: Payer: Self-pay

## 2019-05-04 DIAGNOSIS — Z170 Estrogen receptor positive status [ER+]: Secondary | ICD-10-CM | POA: Diagnosis not present

## 2019-05-04 DIAGNOSIS — C50811 Malignant neoplasm of overlapping sites of right female breast: Secondary | ICD-10-CM | POA: Diagnosis not present

## 2019-05-04 DIAGNOSIS — Z510 Encounter for antineoplastic radiation therapy: Secondary | ICD-10-CM | POA: Diagnosis not present

## 2019-05-04 DIAGNOSIS — C50411 Malignant neoplasm of upper-outer quadrant of right female breast: Secondary | ICD-10-CM | POA: Diagnosis not present

## 2019-05-04 NOTE — Telephone Encounter (Signed)
Prior auth received for Prolia from [place].  [id number], valid 05-03-2019 to 06-09-2020.

## 2019-05-04 NOTE — Telephone Encounter (Signed)
Discussed Prolia benefits w/pt.  Pt understands and agrees would owe approximately $230/no admin fee.

## 2019-05-10 ENCOUNTER — Other Ambulatory Visit: Payer: Self-pay

## 2019-05-10 ENCOUNTER — Ambulatory Visit
Admission: RE | Admit: 2019-05-10 | Discharge: 2019-05-10 | Disposition: A | Discharge status: Home / Self Care | Payer: Medicare HMO | Source: Ambulatory Visit | Attending: Radiation Oncology | Admitting: Radiation Oncology

## 2019-05-10 DIAGNOSIS — C50411 Malignant neoplasm of upper-outer quadrant of right female breast: Secondary | ICD-10-CM | POA: Diagnosis not present

## 2019-05-10 DIAGNOSIS — Z510 Encounter for antineoplastic radiation therapy: Secondary | ICD-10-CM | POA: Diagnosis not present

## 2019-05-10 DIAGNOSIS — C50911 Malignant neoplasm of unspecified site of right female breast: Secondary | ICD-10-CM

## 2019-05-10 DIAGNOSIS — C50811 Malignant neoplasm of overlapping sites of right female breast: Secondary | ICD-10-CM | POA: Diagnosis not present

## 2019-05-10 DIAGNOSIS — Z170 Estrogen receptor positive status [ER+]: Secondary | ICD-10-CM | POA: Diagnosis not present

## 2019-05-10 MED ORDER — ALRA NON-METALLIC DEODORANT (RAD-ONC)
1.0000 "application " | Freq: Once | TOPICAL | Status: AC
  Administered 2019-05-10: 1 via TOPICAL

## 2019-05-10 MED ORDER — SONAFINE EX EMUL
1.0000 "application " | Freq: Two times a day (BID) | CUTANEOUS | Status: DC
  Administered 2019-05-10: 1 via TOPICAL

## 2019-05-11 ENCOUNTER — Other Ambulatory Visit: Payer: Self-pay

## 2019-05-11 ENCOUNTER — Ambulatory Visit
Admission: RE | Admit: 2019-05-11 | Discharge: 2019-05-11 | Disposition: A | Discharge status: Home / Self Care | Payer: Medicare HMO | Source: Ambulatory Visit | Attending: Radiation Oncology | Admitting: Radiation Oncology

## 2019-05-11 DIAGNOSIS — Z510 Encounter for antineoplastic radiation therapy: Secondary | ICD-10-CM | POA: Diagnosis not present

## 2019-05-11 DIAGNOSIS — Z170 Estrogen receptor positive status [ER+]: Secondary | ICD-10-CM | POA: Insufficient documentation

## 2019-05-11 DIAGNOSIS — C50411 Malignant neoplasm of upper-outer quadrant of right female breast: Secondary | ICD-10-CM | POA: Insufficient documentation

## 2019-05-11 DIAGNOSIS — C50811 Malignant neoplasm of overlapping sites of right female breast: Secondary | ICD-10-CM | POA: Diagnosis not present

## 2019-05-12 ENCOUNTER — Ambulatory Visit
Admission: RE | Admit: 2019-05-12 | Discharge: 2019-05-12 | Disposition: A | Discharge status: Home / Self Care | Payer: Medicare HMO | Source: Ambulatory Visit | Attending: Radiation Oncology | Admitting: Radiation Oncology

## 2019-05-12 ENCOUNTER — Other Ambulatory Visit: Payer: Self-pay

## 2019-05-12 DIAGNOSIS — C50411 Malignant neoplasm of upper-outer quadrant of right female breast: Secondary | ICD-10-CM | POA: Diagnosis not present

## 2019-05-12 DIAGNOSIS — C50811 Malignant neoplasm of overlapping sites of right female breast: Secondary | ICD-10-CM | POA: Diagnosis not present

## 2019-05-12 DIAGNOSIS — Z510 Encounter for antineoplastic radiation therapy: Secondary | ICD-10-CM | POA: Diagnosis not present

## 2019-05-12 DIAGNOSIS — Z170 Estrogen receptor positive status [ER+]: Secondary | ICD-10-CM | POA: Diagnosis not present

## 2019-05-13 ENCOUNTER — Other Ambulatory Visit: Payer: Self-pay

## 2019-05-13 ENCOUNTER — Ambulatory Visit
Admission: RE | Admit: 2019-05-13 | Discharge: 2019-05-13 | Disposition: A | Discharge status: Home / Self Care | Payer: Medicare HMO | Source: Ambulatory Visit | Attending: Radiation Oncology | Admitting: Radiation Oncology

## 2019-05-13 DIAGNOSIS — C50811 Malignant neoplasm of overlapping sites of right female breast: Secondary | ICD-10-CM | POA: Diagnosis not present

## 2019-05-13 DIAGNOSIS — C50411 Malignant neoplasm of upper-outer quadrant of right female breast: Secondary | ICD-10-CM | POA: Diagnosis not present

## 2019-05-13 DIAGNOSIS — Z510 Encounter for antineoplastic radiation therapy: Secondary | ICD-10-CM | POA: Diagnosis not present

## 2019-05-13 DIAGNOSIS — Z170 Estrogen receptor positive status [ER+]: Secondary | ICD-10-CM | POA: Diagnosis not present

## 2019-05-14 ENCOUNTER — Other Ambulatory Visit: Payer: Self-pay

## 2019-05-14 ENCOUNTER — Ambulatory Visit
Admission: RE | Admit: 2019-05-14 | Discharge: 2019-05-14 | Disposition: A | Discharge status: Home / Self Care | Payer: Medicare HMO | Source: Ambulatory Visit | Attending: Radiation Oncology | Admitting: Radiation Oncology

## 2019-05-14 DIAGNOSIS — Z510 Encounter for antineoplastic radiation therapy: Secondary | ICD-10-CM | POA: Diagnosis not present

## 2019-05-14 DIAGNOSIS — C50811 Malignant neoplasm of overlapping sites of right female breast: Secondary | ICD-10-CM | POA: Diagnosis not present

## 2019-05-14 DIAGNOSIS — Z170 Estrogen receptor positive status [ER+]: Secondary | ICD-10-CM | POA: Diagnosis not present

## 2019-05-14 DIAGNOSIS — C50411 Malignant neoplasm of upper-outer quadrant of right female breast: Secondary | ICD-10-CM | POA: Diagnosis not present

## 2019-05-17 ENCOUNTER — Ambulatory Visit
Admission: RE | Admit: 2019-05-17 | Discharge: 2019-05-17 | Disposition: A | Discharge status: Home / Self Care | Payer: Medicare HMO | Source: Ambulatory Visit | Attending: Radiation Oncology | Admitting: Radiation Oncology

## 2019-05-17 ENCOUNTER — Other Ambulatory Visit: Payer: Self-pay

## 2019-05-17 DIAGNOSIS — Z170 Estrogen receptor positive status [ER+]: Secondary | ICD-10-CM | POA: Diagnosis not present

## 2019-05-17 DIAGNOSIS — C50411 Malignant neoplasm of upper-outer quadrant of right female breast: Secondary | ICD-10-CM | POA: Diagnosis not present

## 2019-05-17 DIAGNOSIS — Z510 Encounter for antineoplastic radiation therapy: Secondary | ICD-10-CM | POA: Diagnosis not present

## 2019-05-17 DIAGNOSIS — C50811 Malignant neoplasm of overlapping sites of right female breast: Secondary | ICD-10-CM | POA: Diagnosis not present

## 2019-05-18 ENCOUNTER — Other Ambulatory Visit: Payer: Self-pay

## 2019-05-18 ENCOUNTER — Ambulatory Visit
Admission: RE | Admit: 2019-05-18 | Discharge: 2019-05-18 | Disposition: A | Discharge status: Home / Self Care | Payer: Medicare HMO | Source: Ambulatory Visit | Attending: Radiation Oncology | Admitting: Radiation Oncology

## 2019-05-18 DIAGNOSIS — C50811 Malignant neoplasm of overlapping sites of right female breast: Secondary | ICD-10-CM | POA: Diagnosis not present

## 2019-05-18 DIAGNOSIS — C50411 Malignant neoplasm of upper-outer quadrant of right female breast: Secondary | ICD-10-CM | POA: Diagnosis not present

## 2019-05-18 DIAGNOSIS — Z510 Encounter for antineoplastic radiation therapy: Secondary | ICD-10-CM | POA: Diagnosis not present

## 2019-05-18 DIAGNOSIS — Z170 Estrogen receptor positive status [ER+]: Secondary | ICD-10-CM | POA: Diagnosis not present

## 2019-05-19 ENCOUNTER — Other Ambulatory Visit: Payer: Self-pay

## 2019-05-19 ENCOUNTER — Ambulatory Visit
Admission: RE | Admit: 2019-05-19 | Discharge: 2019-05-19 | Disposition: A | Discharge status: Home / Self Care | Payer: Medicare HMO | Source: Ambulatory Visit | Attending: Radiation Oncology | Admitting: Radiation Oncology

## 2019-05-19 DIAGNOSIS — Z170 Estrogen receptor positive status [ER+]: Secondary | ICD-10-CM | POA: Diagnosis not present

## 2019-05-19 DIAGNOSIS — Z510 Encounter for antineoplastic radiation therapy: Secondary | ICD-10-CM | POA: Diagnosis not present

## 2019-05-19 DIAGNOSIS — C50811 Malignant neoplasm of overlapping sites of right female breast: Secondary | ICD-10-CM | POA: Diagnosis not present

## 2019-05-19 DIAGNOSIS — C50411 Malignant neoplasm of upper-outer quadrant of right female breast: Secondary | ICD-10-CM | POA: Diagnosis not present

## 2019-05-20 ENCOUNTER — Ambulatory Visit
Admission: RE | Admit: 2019-05-20 | Discharge: 2019-05-20 | Disposition: A | Discharge status: Home / Self Care | Payer: Medicare HMO | Source: Ambulatory Visit | Attending: Radiation Oncology | Admitting: Radiation Oncology

## 2019-05-20 ENCOUNTER — Other Ambulatory Visit: Payer: Self-pay

## 2019-05-20 DIAGNOSIS — Z170 Estrogen receptor positive status [ER+]: Secondary | ICD-10-CM | POA: Diagnosis not present

## 2019-05-20 DIAGNOSIS — C50811 Malignant neoplasm of overlapping sites of right female breast: Secondary | ICD-10-CM | POA: Diagnosis not present

## 2019-05-20 DIAGNOSIS — C50411 Malignant neoplasm of upper-outer quadrant of right female breast: Secondary | ICD-10-CM | POA: Diagnosis not present

## 2019-05-20 DIAGNOSIS — Z510 Encounter for antineoplastic radiation therapy: Secondary | ICD-10-CM | POA: Diagnosis not present

## 2019-05-21 ENCOUNTER — Other Ambulatory Visit: Payer: Self-pay

## 2019-05-21 ENCOUNTER — Ambulatory Visit
Admission: RE | Admit: 2019-05-21 | Discharge: 2019-05-21 | Disposition: A | Discharge status: Home / Self Care | Payer: Medicare HMO | Source: Ambulatory Visit | Attending: Radiation Oncology | Admitting: Radiation Oncology

## 2019-05-21 DIAGNOSIS — Z170 Estrogen receptor positive status [ER+]: Secondary | ICD-10-CM | POA: Diagnosis not present

## 2019-05-21 DIAGNOSIS — Z510 Encounter for antineoplastic radiation therapy: Secondary | ICD-10-CM | POA: Diagnosis not present

## 2019-05-21 DIAGNOSIS — C50411 Malignant neoplasm of upper-outer quadrant of right female breast: Secondary | ICD-10-CM | POA: Diagnosis not present

## 2019-05-21 DIAGNOSIS — C50811 Malignant neoplasm of overlapping sites of right female breast: Secondary | ICD-10-CM | POA: Diagnosis not present

## 2019-05-24 ENCOUNTER — Ambulatory Visit
Admission: RE | Admit: 2019-05-24 | Discharge: 2019-05-24 | Disposition: A | Discharge status: Home / Self Care | Payer: Medicare HMO | Source: Ambulatory Visit | Attending: Radiation Oncology | Admitting: Radiation Oncology

## 2019-05-24 ENCOUNTER — Other Ambulatory Visit: Payer: Self-pay

## 2019-05-24 DIAGNOSIS — C50811 Malignant neoplasm of overlapping sites of right female breast: Secondary | ICD-10-CM | POA: Diagnosis not present

## 2019-05-24 DIAGNOSIS — Z170 Estrogen receptor positive status [ER+]: Secondary | ICD-10-CM | POA: Diagnosis not present

## 2019-05-24 DIAGNOSIS — Z510 Encounter for antineoplastic radiation therapy: Secondary | ICD-10-CM | POA: Diagnosis not present

## 2019-05-24 DIAGNOSIS — C50411 Malignant neoplasm of upper-outer quadrant of right female breast: Secondary | ICD-10-CM | POA: Diagnosis not present

## 2019-05-25 ENCOUNTER — Other Ambulatory Visit: Payer: Self-pay

## 2019-05-25 ENCOUNTER — Ambulatory Visit
Admission: RE | Admit: 2019-05-25 | Discharge: 2019-05-25 | Disposition: A | Discharge status: Home / Self Care | Payer: Medicare HMO | Source: Ambulatory Visit | Attending: Radiation Oncology | Admitting: Radiation Oncology

## 2019-05-25 DIAGNOSIS — Z510 Encounter for antineoplastic radiation therapy: Secondary | ICD-10-CM | POA: Diagnosis not present

## 2019-05-25 DIAGNOSIS — C50411 Malignant neoplasm of upper-outer quadrant of right female breast: Secondary | ICD-10-CM | POA: Diagnosis not present

## 2019-05-25 DIAGNOSIS — Z170 Estrogen receptor positive status [ER+]: Secondary | ICD-10-CM | POA: Diagnosis not present

## 2019-05-25 DIAGNOSIS — C50811 Malignant neoplasm of overlapping sites of right female breast: Secondary | ICD-10-CM | POA: Diagnosis not present

## 2019-05-26 ENCOUNTER — Ambulatory Visit
Admission: RE | Admit: 2019-05-26 | Discharge: 2019-05-26 | Disposition: A | Discharge status: Home / Self Care | Payer: Medicare HMO | Source: Ambulatory Visit | Attending: Radiation Oncology | Admitting: Radiation Oncology

## 2019-05-26 ENCOUNTER — Other Ambulatory Visit: Payer: Self-pay

## 2019-05-26 DIAGNOSIS — Z170 Estrogen receptor positive status [ER+]: Secondary | ICD-10-CM | POA: Diagnosis not present

## 2019-05-26 DIAGNOSIS — Z510 Encounter for antineoplastic radiation therapy: Secondary | ICD-10-CM | POA: Diagnosis not present

## 2019-05-26 DIAGNOSIS — C50411 Malignant neoplasm of upper-outer quadrant of right female breast: Secondary | ICD-10-CM | POA: Diagnosis not present

## 2019-05-26 DIAGNOSIS — C50811 Malignant neoplasm of overlapping sites of right female breast: Secondary | ICD-10-CM | POA: Diagnosis not present

## 2019-05-27 ENCOUNTER — Other Ambulatory Visit: Payer: Self-pay

## 2019-05-27 ENCOUNTER — Ambulatory Visit
Admission: RE | Admit: 2019-05-27 | Discharge: 2019-05-27 | Disposition: A | Discharge status: Home / Self Care | Payer: Medicare HMO | Source: Ambulatory Visit | Attending: Radiation Oncology | Admitting: Radiation Oncology

## 2019-05-27 DIAGNOSIS — Z170 Estrogen receptor positive status [ER+]: Secondary | ICD-10-CM | POA: Diagnosis not present

## 2019-05-27 DIAGNOSIS — C50811 Malignant neoplasm of overlapping sites of right female breast: Secondary | ICD-10-CM | POA: Diagnosis not present

## 2019-05-27 DIAGNOSIS — C50411 Malignant neoplasm of upper-outer quadrant of right female breast: Secondary | ICD-10-CM | POA: Diagnosis not present

## 2019-05-27 DIAGNOSIS — Z510 Encounter for antineoplastic radiation therapy: Secondary | ICD-10-CM | POA: Diagnosis not present

## 2019-05-28 ENCOUNTER — Encounter: Payer: Self-pay | Admitting: Radiation Oncology

## 2019-05-28 ENCOUNTER — Other Ambulatory Visit: Payer: Self-pay

## 2019-05-28 ENCOUNTER — Encounter: Payer: Self-pay | Admitting: *Deleted

## 2019-05-28 ENCOUNTER — Ambulatory Visit
Admission: RE | Admit: 2019-05-28 | Discharge: 2019-05-28 | Disposition: A | Discharge status: Home / Self Care | Payer: Medicare HMO | Source: Ambulatory Visit | Attending: Radiation Oncology | Admitting: Radiation Oncology

## 2019-05-28 DIAGNOSIS — C50411 Malignant neoplasm of upper-outer quadrant of right female breast: Secondary | ICD-10-CM

## 2019-05-28 DIAGNOSIS — C50811 Malignant neoplasm of overlapping sites of right female breast: Secondary | ICD-10-CM | POA: Diagnosis not present

## 2019-05-28 DIAGNOSIS — Z170 Estrogen receptor positive status [ER+]: Secondary | ICD-10-CM

## 2019-05-28 DIAGNOSIS — Z510 Encounter for antineoplastic radiation therapy: Secondary | ICD-10-CM | POA: Diagnosis not present

## 2019-05-28 MED ORDER — SONAFINE EX EMUL
1.0000 "application " | Freq: Once | CUTANEOUS | Status: AC
  Administered 2019-05-28: 1 via TOPICAL

## 2019-06-02 ENCOUNTER — Ambulatory Visit: Payer: Medicare HMO

## 2019-06-21 ENCOUNTER — Telehealth: Payer: Self-pay

## 2019-06-21 NOTE — Telephone Encounter (Signed)
Called pt to r/s Prolia injection.  Pt to get Covid vaccine this Friday and wants to wait til February if she decides to do Prolia d/t.  Pt wants to think about whether to get Prolia or not and would like a call to discuss side effects, ie., pain.  Her children and "other" MD said since she had pain w/pill she would have pain w/Prolia.

## 2019-06-21 NOTE — Telephone Encounter (Signed)
It is a possible side effect, but the pill she took previously is in an entirely different class of medication so I do not expect it.   It will be up to her of course and if she is extremely apprehensive I don't think it is worth going forward.

## 2019-06-21 NOTE — Telephone Encounter (Signed)
Pt notified of Dr. Marliss Coots comments. Pt said since she is getting the covid vaccine this week she doesn't want to proceed with Prolia, pt advise if she changes her mind to let us know but for now she doesn't want to proceed

## 2019-06-23 ENCOUNTER — Telehealth: Payer: Self-pay | Admitting: Family Medicine

## 2019-06-23 MED ORDER — POTASSIUM CHLORIDE CRYS ER 10 MEQ PO TBCR: 10.0000 meq | EXTENDED_RELEASE_TABLET | Freq: Every day | ORAL | 1 refills | Status: DC

## 2019-06-23 MED ORDER — DILTIAZEM HCL ER COATED BEADS 120 MG PO CP24: 120.0000 mg | ORAL_CAPSULE | Freq: Every day | ORAL | 1 refills | Status: DC

## 2019-06-23 MED ORDER — HYDROCHLOROTHIAZIDE 25 MG PO TABS: 25.0000 mg | ORAL_TABLET | Freq: Every day | ORAL | 1 refills | Status: DC

## 2019-06-23 NOTE — Telephone Encounter (Signed)
Rx sent 

## 2019-06-23 NOTE — Telephone Encounter (Signed)
Patient called [facility] and they told patient to call PCP to get refills on Diltiazem, HCTZ, and Potassium. Patient's not out of her medication, yet.

## 2019-07-03 NOTE — Progress Notes (Signed)
  Patient Name: Sara [name] MRN: [id number] DOB: 01/29/1932 Referring Physician: Nicholas Lose (Profile Not Attached) Date of Service: 05/28/2019 [facility], [place]                                                        End Of Treatment Note  Diagnoses: C50.811-Malignant neoplasm of overlapping sites of right female breast  Cancer Staging: Cancer Staging Carcinoma of upper-outer quadrant of right breast in female, estrogen receptor positive ([place]) Staging form: Breast, AJCC 8th Edition - Clinical stage from 02/23/2019: cT1c, cN0, cM0, GX, ER+, PR+, HER2- - Signed by Eppie Gibson, MD on 02/23/2019 pT1c, pNx  Intent: Curative  Radiation Treatment Dates: 05/10/2019 through 05/28/2019 Site Technique Total Dose (Gy) Dose per Fx (Gy) Completed Fx Beam Energies  Breast, Right: Breast_Rt_axilla 3D 40.05/40.05 2.67 15/15 6X, 10X   Narrative: The patient tolerated radiation therapy relatively well. The breast and axilla were treated with high tangents.  Plan: The patient will follow-up with radiation oncology in 52mo or as needed.  -----------------------------------  SEppie Gibson MD

## 2019-08-12 ENCOUNTER — Telehealth: Payer: Self-pay | Admitting: Adult Health

## 2019-08-12 ENCOUNTER — Inpatient Hospital Stay: Payer: Medicare HMO | Admitting: Adult Health

## 2019-08-12 NOTE — Telephone Encounter (Signed)
Rescheduled appt per 3/4 sch msg. Pt is aware of new appt date and time.

## 2019-10-12 NOTE — Progress Notes (Signed)
SURVIVORSHIP VISIT:  BRIEF ONCOLOGIC HISTORY:  Oncology History  Carcinoma of upper-outer quadrant of right breast in female, estrogen receptor positive (Sara [name])  02/23/2019 Initial Diagnosis   Routine screening mammogram detected a 1.2cm mass in the right breast. Biopsy showed invasive mammary carcinoma with mammary carcinoma in situ, HER-2 - (1+), ER+ 100%, PR+ 5% Ki67 2%.    02/23/2019 Cancer Staging   Staging form: Breast, AJCC 8th Edition - Clinical stage from 02/23/2019: cT1c, cN0, cM0, GX, ER+, PR+, HER2-    04/07/2019 Cancer Staging   Staging form: Breast, AJCC 8th Edition - Pathologic stage from 04/07/2019: Stage IA (pT1c, pN0, cM0, G2, ER+, PR+, HER2-)    04/07/2019 Surgery   Right lumpectomy Sara [name]) [phone]): IDC, grade 2, 1.3cm, with intermediate grade DCIS, clear margins. No regional lymph nodes were examined.   05/10/2019 - 05/28/2019 Radiation Therapy   The patient initially received a dose of 40.05 Gy in 15 fractions to the right breast and axilla using whole-breast tangent fields. This was delivered using a 3-D conformal technique. The pt did not receive a boost. The total dose was 40.05 Gy.   04/2019 - 04/2024 Anti-estrogen oral therapy   Anastrozole     INTERVAL HISTORY:  Sara [name] to review her survivorship care plan detailing her treatment course for breast cancer, as well as monitoring long-term side effects of that treatment, education regarding health maintenance, screening, and overall wellness and health promotion.     Overall, Sara [name] reports feeling quite well.  She completed the survivorship survey and noted some memory changes and fatigue.  This is most noticeable when she gets up to do something.  She is taking anastrozole daily.  She takes this at night because it makes her tired.  She has unchanged arthralgias and denies hot flashes.    REVIEW OF SYSTEMS:  Review of Systems  Constitutional: Positive for fatigue. Negative for appetite  change, chills, fever and unexpected weight change.  HENT:   Negative for hearing loss, lump/mass and trouble swallowing.   Eyes: Negative for eye problems and icterus.  Respiratory: Negative for chest tightness, cough and shortness of breath.   Cardiovascular: Negative for chest pain, leg swelling and palpitations.  Gastrointestinal: Negative for abdominal distention, abdominal pain, constipation, diarrhea, nausea and vomiting.  Endocrine: Negative for hot flashes.  Genitourinary: Negative for difficulty urinating.   Musculoskeletal: Negative for arthralgias.  Skin: Negative for itching and rash.  Neurological: Negative for dizziness, extremity weakness, headaches and numbness.  Hematological: Negative for adenopathy. Does not bruise/bleed easily.  Psychiatric/Behavioral: Negative for depression. The patient is not nervous/anxious.   Breast: Denies any new nodularity, masses, tenderness, nipple changes, or nipple discharge.    ONCOLOGY TREATMENT TEAM:  1. Surgeon:  Dr. Marlou [name] at [facility] 2. Medical Oncologist: Dr. Lindi [name]  3. Radiation Oncologist: Dr. Isidore [name]    PAST MEDICAL/SURGICAL HISTORY:  Past Medical History:  Diagnosis Date  . Allergy   . CAD (coronary artery disease)   . Chronic diastolic (congestive) heart failure ([place])   . Clinical trial participant    [facility] studies in familial dementia  . Dyspnea 11/28/2016  . Family history of breast cancer   . Family history of lung cancer   . Family history of prostate cancer   . Frequent UTI   . GERD (gastroesophageal reflux disease)   . Hypertension   . Osteoarthritis    hips, back  . Osteopenia   . Pneumonia   . Spinal stenosis   .  Stroke [facility] 1985   Past Surgical History:  Procedure Laterality Date  . ABDOMINAL HYSTERECTOMY     partial has one ovary  . APPENDECTOMY    . BREAST LUMPECTOMY WITH RADIOACTIVE SEED LOCALIZATION Right 04/07/2019   Procedure: RIGHT BREAST LUMPECTOMY WITH  RADIOACTIVE SEED LOCALIZATION;  Surgeon: Sara Kussmaul, MD;  Location: [place];  Service: General;  Laterality: Right;  . CATARACT EXTRACTION Bilateral   . LEFT HEART CATH AND CORONARY ANGIOGRAPHY N/A 11/29/2016   Procedure: Left Heart Cath and Coronary Angiography;  Surgeon: Sherren Mocha, MD;  Location: [place] [facility];  Service: Cardiovascular;  Laterality: N/A;  . LUMBAR DISC SURGERY     x 3  . rotator cuff surgery Right 2010     ALLERGIES:  Allergies  Allergen Reactions  . Amoxicillin     Reaction not known  . Atorvastatin     aching  . Cefpodoxime Proxetil     Reaction not known  . Clarithromycin     Reaction not known  . Fosamax [Alendronate Sodium]     Body pain   . Furosemide     REACTION: doesn't help  . Gabapentin Swelling    Caused severe swellling     CURRENT MEDICATIONS:  Outpatient Encounter Medications as of 10/13/2019  Medication Sig  . anastrozole (ARIMIDEX) 1 MG tablet Take 1 tablet (1 mg total) by mouth daily.  Marland Kitchen aspirin EC 81 MG tablet Take 81 mg by mouth daily.    . Cholecalciferol (VITAMIN D-3) 1000 UNITS CAPS Take 1,000 Units by mouth daily.   Marland Kitchen diltiazem (CARDIZEM CD) 120 MG 24 hr capsule Take 1 capsule (120 mg total) by mouth daily.  Mariane Baumgarten Sodium (EQ STOOL SOFTENER PO) Take 100 mg by mouth daily as needed (constipation).   Marland Kitchen doxylamine, Sleep, (EQ SLEEP AID) 25 MG tablet Take 25 mg by mouth at bedtime as needed for sleep.   . hydrochlorothiazide (HYDRODIURIL) 25 MG tablet Take 1 tablet (25 mg total) by mouth daily.  Marland Kitchen HYDROcodone-acetaminophen (NORCO/VICODIN) 5-325 MG tablet Take 1-2 tablets by mouth every 6 (six) hours as needed for moderate pain or severe pain.  . nitroGLYCERIN (NITROSTAT) 0.4 MG SL tablet Place 1 tablet (0.4 mg total) under the tongue every 5 (five) minutes as needed for chest pain (max 3 doses in 15 minutes).  . polyethylene glycol (MIRALAX / GLYCOLAX) packet Take 17 g by mouth daily.   . potassium chloride (KLOR-CON) 10  MEQ tablet Take 1 tablet (10 mEq total) by mouth daily.  . traMADol (ULTRAM) 50 MG tablet Take 1 tablet (50 mg total) by mouth every 6 (six) hours as needed.  . vitamin B-12 (CYANOCOBALAMIN) 1000 MCG tablet Take 1,000 mcg by mouth daily.  . [DISCONTINUED] potassium chloride (KLOR-CON) 10 MEQ tablet Take 10 mEq by mouth daily.   No facility-administered encounter medications on file as of 10/13/2019.     ONCOLOGIC FAMILY HISTORY:  Family History  Problem Relation Age of Onset  . Lung cancer Brother 62  . Other Mother        kidney tumor  . Hypertension Mother   . Diabetes Sister   . Diabetes Brother   . Diabetes Sister        x 2  . Colon cancer Maternal Uncle   . Other Sister        intestine burst  . Breast cancer Maternal Aunt        x 2  . Breast cancer Daughter  unknown cancer  . Diabetes Son        x 3  . Diabetes Daughter   . Breast cancer Sister        x2, diagnosed in 26s and 59s  . Prostate cancer Maternal Uncle   . Esophageal cancer Neg Hx   . Rectal cancer Neg Hx   . Stomach cancer Neg Hx      GENETIC COUNSELING/TESTING: Not at this time  SOCIAL HISTORY:  Social History   Socioeconomic History  . Marital status: Married    Spouse name: Not on file  . Number of children: 6  . Years of education: Not on file  . Highest education level: Not on file  Occupational History  . Occupation: RETIRED    Employer: RETIRED  Tobacco Use  . Smoking status: Never Smoker  . Smokeless tobacco: Never Used  Substance and Sexual Activity  . Alcohol use: No    Alcohol/week: 0.0 standard drinks  . Drug use: No  . Sexual activity: Not on file  Other Topics Concern  . Not on file  Social History Narrative  . Not on file   Social Determinants of Health   Financial Resource Strain:   . Difficulty of Paying Living Expenses:   Food Insecurity:   . Worried About Charity fundraiser in the Last Year:   . Arboriculturist in the Last Year:   Transportation  Needs: No Transportation Needs  . Lack of Transportation (Medical): No  . Lack of Transportation (Non-Medical): No  Physical Activity:   . Days of Exercise per Week:   . Minutes of Exercise per Session:   Stress:   . Feeling of Stress :   Social Connections:   . Frequency of Communication with Friends and Family:   . Frequency of Social Gatherings with Friends and Family:   . Attends Religious Services:   . Active Member of Clubs or Organizations:   . Attends Archivist Meetings:   Marland Kitchen Marital Status:   Intimate Partner Violence: Not At Risk  . Fear of Current or Ex-Partner: No  . Emotionally Abused: No  . Physically Abused: No  . Sexually Abused: No     OBSERVATIONS/OBJECTIVE:  BP (!) 144/87   Pulse 63   Temp 98.5 F (36.9 C)   Resp 17   Ht '5\' 2"'$  (1.575 m)   Wt 149 lb 6.4 oz (67.8 kg)   SpO2 100%   BMI 27.33 kg/m  GENERAL: Patient is a well appearing female in no acute distress HEENT:  Sclerae anicteric.  Mask in place.  Neck is supple.  NODES:  No cervical, supraclavicular, or axillary lymphadenopathy palpated.  BREAST EXAM:  Right breast s/p lumpectomy and radiation, no sign of local recurrence, left breast benign LUNGS:  Clear to auscultation bilaterally.  No wheezes or rhonchi. HEART:  Regular rate and rhythm. No murmur appreciated. ABDOMEN:  Soft, nontender.  Positive, normoactive bowel sounds. No organomegaly palpated. MSK:  No focal spinal tenderness to palpation. Full range of motion bilaterally in the upper extremities. EXTREMITIES:  No peripheral edema.   SKIN:  Clear with no obvious rashes or skin changes. No nail dyscrasia. NEURO:  Nonfocal. Well oriented.  Appropriate affect.    LABORATORY DATA:  None for this visit.  DIAGNOSTIC IMAGING:  None for this visit.      ASSESSMENT AND PLAN:  Sara [name] is a pleasant 86 y.o. female with Stage IA right breast invasive ductal carcinoma, ER+/PR+/HER2-, diagnosed  in 02/2019, treated with  lumpectomy, adjuvant radiation therapy, and anti-estrogen therapy with Anastrozole beginning in 04/2019.  She presents to the Survivorship Clinic for our initial meeting and routine follow-up post-completion of treatment for breast cancer.    1. Stage IA right breast cancer:  Sara [name] is continuing to recover from definitive treatment for breast cancer. She will follow-up with her medical oncologist, Dr. Lindi [name] in 02/2020 with history and physical exam per surveillance protocol.  She will continue her anti-estrogen therapy with Anastrozole.  Thus far, she is tolerating the Anastrozole well, with minimal side effects. She was instructed to make Dr. Lindi [name] or myself aware if she begins to experience any worsening side effects of the medication and I could see her back in clinic to help manage those side effects, as needed. Her mammogram is due 01/2020; orders placed today.  Today, a comprehensive survivorship care plan and treatment summary was reviewed with the patient today detailing her breast cancer diagnosis, treatment course, potential late/long-term effects of treatment, appropriate follow-up care with recommendations for the future, and patient education resources.  A copy of this summary, along with a letter will be sent to the patient's primary care provider via mail/fax/In Basket message after today's visit.    #. Problem(s) at Visit______________  #. Bone health:  Given Sara [name]'s age/history of breast cancer and her current treatment regimen including anti-estrogen therapy with Anastrozole, she is at risk for further bone demineralization.  Her last DEXA scan was 12/2016, which showed osteoporosis with a T score of -3.0. She is taking calcium and vitamin D.  I placed orders for a repeat bone density test to be completed when she undergoes her mammogram in 01/2020.  She was given education on specific activities to promote bone health.  3. Cancer screening:  Due to Sara [name]'s history  and her age, she should receive screening for skin cancers, colon cancer, and gynecologic cancers.  The information and recommendations are listed on the patient's comprehensive care plan/treatment summary and were reviewed in detail with the patient.    4. Health maintenance and wellness promotion: Sara [name] was encouraged to consume 5-7 servings of fruits and vegetables per day. We reviewed the "Nutrition Rainbow" handout, as well as the handout "Take Control of Your Health and Reduce Your Cancer Risk" from the [facility].  She was also encouraged to engage in moderate to vigorous exercise for 30 minutes per day most days of the week. We discussed the [employer] fitness program, which is designed for cancer survivors to help them become more physically fit after cancer treatments.  She was instructed to limit her alcohol consumption and continue to abstain from tobacco use.     5. Support services/counseling: It is not uncommon for this period of the patient's cancer care trajectory to be one of many emotions and stressors.  We discussed how this can be increasingly difficult during the times of quarantine and social distancing due to the COVID-19 pandemic.   She was given information regarding our available services and encouraged to contact me with any questions or for help enrolling in any of our support group/programs.    Follow up instructions:    -Return to cancer center in 02/2020 for f/u with Dr. Lindi [name]  -Mammogram due in 01/2020 -Bone density 01/2020 -Follow up with surgery tomorrow -She is welcome to return back to the Survivorship Clinic at any time; no additional follow-up needed at this time.  -Consider referral back to survivorship as  a long-term survivor for continued surveillance  The patient was provided an opportunity to ask questions and all were answered. The patient agreed with the plan and demonstrated an understanding of the instructions.   Total  encounter time: 30 minutes*  Wilber Bihari, NP 10/13/19 11:02 AM Medical Oncology and Hematology [facility] [place], [place] [address] Tel. [phone]    Fax. [phone]  *Total Encounter Time as defined by the Centers for Medicare and Medicaid Services includes, in addition to the face-to-face time of a patient visit (documented in the note above) non-face-to-face time: obtaining and reviewing outside history, ordering and reviewing medications, tests or procedures, care coordination (communications with other health care professionals or caregivers) and documentation in the medical record.

## 2019-10-13 ENCOUNTER — Inpatient Hospital Stay: Payer: Medicare HMO | Attending: Adult Health | Admitting: Adult Health

## 2019-10-13 ENCOUNTER — Other Ambulatory Visit: Payer: Self-pay

## 2019-10-13 VITALS — BP 144/87 | HR 63 | Temp 98.5°F | Resp 17 | Ht 62.0 in | Wt 149.4 lb

## 2019-10-13 DIAGNOSIS — Z79899 Other long term (current) drug therapy: Secondary | ICD-10-CM | POA: Diagnosis not present

## 2019-10-13 DIAGNOSIS — Z803 Family history of malignant neoplasm of breast: Secondary | ICD-10-CM | POA: Insufficient documentation

## 2019-10-13 DIAGNOSIS — Z7982 Long term (current) use of aspirin: Secondary | ICD-10-CM | POA: Insufficient documentation

## 2019-10-13 DIAGNOSIS — I5032 Chronic diastolic (congestive) heart failure: Secondary | ICD-10-CM | POA: Insufficient documentation

## 2019-10-13 DIAGNOSIS — M2550 Pain in unspecified joint: Secondary | ICD-10-CM | POA: Diagnosis not present

## 2019-10-13 DIAGNOSIS — E2839 Other primary ovarian failure: Secondary | ICD-10-CM | POA: Diagnosis not present

## 2019-10-13 DIAGNOSIS — Z8673 Personal history of transient ischemic attack (TIA), and cerebral infarction without residual deficits: Secondary | ICD-10-CM | POA: Insufficient documentation

## 2019-10-13 DIAGNOSIS — Z800 Family history of malignant neoplasm of digestive organs: Secondary | ICD-10-CM | POA: Insufficient documentation

## 2019-10-13 DIAGNOSIS — Z923 Personal history of irradiation: Secondary | ICD-10-CM | POA: Diagnosis not present

## 2019-10-13 DIAGNOSIS — C50411 Malignant neoplasm of upper-outer quadrant of right female breast: Secondary | ICD-10-CM | POA: Diagnosis not present

## 2019-10-13 DIAGNOSIS — I2510 Atherosclerotic heart disease of native coronary artery without angina pectoris: Secondary | ICD-10-CM | POA: Insufficient documentation

## 2019-10-13 DIAGNOSIS — Z79811 Long term (current) use of aromatase inhibitors: Secondary | ICD-10-CM | POA: Diagnosis not present

## 2019-10-13 DIAGNOSIS — I10 Essential (primary) hypertension: Secondary | ICD-10-CM | POA: Diagnosis not present

## 2019-10-13 DIAGNOSIS — Z170 Estrogen receptor positive status [ER+]: Secondary | ICD-10-CM

## 2019-10-14 ENCOUNTER — Telehealth: Payer: Self-pay | Admitting: Adult Health

## 2019-10-14 DIAGNOSIS — C50411 Malignant neoplasm of upper-outer quadrant of right female breast: Secondary | ICD-10-CM | POA: Diagnosis not present

## 2019-10-14 DIAGNOSIS — Z170 Estrogen receptor positive status [ER+]: Secondary | ICD-10-CM | POA: Diagnosis not present

## 2019-10-14 NOTE — Telephone Encounter (Signed)
No 5/5 los. No changes made to pt's schedule

## 2019-10-18 ENCOUNTER — Ambulatory Visit (INDEPENDENT_AMBULATORY_CARE_PROVIDER_SITE_OTHER)
Admission: RE | Admit: 2019-10-18 | Discharge: 2019-10-18 | Disposition: A | Discharge status: Home / Self Care | Payer: Medicare HMO | Source: Ambulatory Visit | Attending: Family Medicine | Admitting: Family Medicine

## 2019-10-18 ENCOUNTER — Other Ambulatory Visit: Payer: Self-pay

## 2019-10-18 ENCOUNTER — Encounter: Payer: Self-pay | Admitting: Family Medicine

## 2019-10-18 ENCOUNTER — Ambulatory Visit (INDEPENDENT_AMBULATORY_CARE_PROVIDER_SITE_OTHER): Payer: Medicare HMO | Admitting: Family Medicine

## 2019-10-18 VITALS — BP 130/84 | HR 50 | Temp 98.0°F | Ht 62.0 in | Wt 148.6 lb

## 2019-10-18 DIAGNOSIS — Z853 Personal history of malignant neoplasm of breast: Secondary | ICD-10-CM

## 2019-10-18 DIAGNOSIS — M79641 Pain in right hand: Secondary | ICD-10-CM

## 2019-10-18 DIAGNOSIS — I5032 Chronic diastolic (congestive) heart failure: Secondary | ICD-10-CM

## 2019-10-18 DIAGNOSIS — M19031 Primary osteoarthritis, right wrist: Secondary | ICD-10-CM | POA: Diagnosis not present

## 2019-10-18 DIAGNOSIS — M19041 Primary osteoarthritis, right hand: Secondary | ICD-10-CM | POA: Diagnosis not present

## 2019-10-18 NOTE — Progress Notes (Signed)
Subjective:    Patient ID: Sara [name], female    DOB: [birth year], 86 y.o.   MRN: [id number]  This visit occurred during the SARS-CoV-2 public health emergency.  Safety protocols were in place, including screening questions prior to the visit, additional usage of staff PPE, and extensive cleaning of exam room while observing appropriate contact time as indicated for disinfecting solutions.    HPI  Pt presents with R hand pain   Wt Readings from Last 3 Encounters:  10/18/19 148 lb 9 oz (67.4 kg)  10/13/19 149 lb 6.4 oz (67.8 kg)  04/13/19 146 lb 4.8 oz (66.4 kg)   27.17 kg/m   She is right handed   Pain is in radial side of her hand  When she moves thumb-hurts into wrist and hand  Over a year but more now  Cannot do a lot with it  Not swollen  No injury known  ? If any reped lifting   She can barely grip- hurts  Not numb   (her other hand gets a little numb occasionally)   Being still helps  Tylenol helps  Has not tried tylenol    She is "in pain" all over all the time /right now concerned about hand   Has h/o breast cancer  Had R lumpectomy wit radioactive seed  Also lymph nodes (she is at risk of lymphedema in that arm)  Takes arimidex   Patient Active Problem List   Diagnosis Date Noted  . Hand pain, right 10/18/2019  . Family history of breast cancer   . Family history of prostate cancer   . Family history of lung cancer   . Carcinoma of upper-outer quadrant of right breast in female, estrogen receptor positive ([place]) 02/23/2019  . Pre-operative clearance 02/21/2019  . History of breast cancer 02/21/2019  . Medicare annual wellness visit, subsequent 01/05/2019  . Encounter for screening mammogram for breast cancer 01/05/2019  . Knee pain, left 10/14/2018  . Arthritis of left hip 07/29/2018  . Bradycardia 07/01/2018  . Dyspnea 07/01/2018  . Rhinorrhea 01/05/2018  . Chronic diastolic heart failure ([place]) 12/27/2017  . Lumbar spinal stenosis  03/11/2017  . Diastolic dysfunction 123456  . Estrogen deficiency 12/16/2016  . Routine general medical examination at a health care facility 12/16/2016  . CAD (coronary artery disease) 12/04/2016  . Dyspepsia 03/15/2016  . 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 Eczema 03/06/2015  . Prediabetes 02/11/2014  . Intertrigo labialis 12/07/2012  . CONSTIPATION, CHRONIC 07/03/2010  . Osteoporosis 06/24/2008  . HYPERCHOLESTEROLEMIA 12/08/2006  . Essential hypertension 12/08/2006  . GERD 12/08/2006  . H/O herpes zoster 12/08/2006   Past Medical History:  Diagnosis Date  . Allergy   . CAD (coronary artery disease)   . Chronic diastolic (congestive) heart failure ([place])   . Clinical trial participant    [facility] studies in familial dementia  . Dyspnea 11/28/2016  . Family history of breast cancer   . Family history of lung cancer   . Family history of prostate cancer   . Frequent UTI   . GERD (gastroesophageal reflux disease)   . Hypertension   . Osteoarthritis    hips, back  . Osteopenia   . Pneumonia   . Spinal stenosis   . Stroke [facility]) 1985   Past Surgical History:  Procedure Laterality Date  . ABDOMINAL HYSTERECTOMY     partial has one ovary  . APPENDECTOMY    . BREAST LUMPECTOMY WITH RADIOACTIVE SEED LOCALIZATION Right 04/07/2019  Procedure: RIGHT BREAST LUMPECTOMY WITH RADIOACTIVE SEED LOCALIZATION;  Surgeon: Jovita Kussmaul, MD;  Location: [facility];  Service: General;  Laterality: Right;  . CATARACT EXTRACTION Bilateral   . LEFT HEART CATH AND CORONARY ANGIOGRAPHY N/A 11/29/2016   Procedure: Left Heart Cath and Coronary Angiography;  Surgeon: Sherren Mocha, MD;  Location: [facility];  Service: Cardiovascular;  Laterality: N/A;  . LUMBAR DISC SURGERY     x 3  . rotator cuff surgery Right 2010   Social History   Tobacco Use  . Smoking status: Never Smoker  . Smokeless tobacco: Never Used  Substance Use Topics  . Alcohol use: No    Alcohol/week: 0.0  standard drinks  . Drug use: No   Family History  Problem Relation Age of Onset  . Lung cancer Brother 23  . Other Mother        kidney tumor  . Hypertension Mother   . Diabetes Sister   . Diabetes Brother   . Diabetes Sister        x 2  . Colon cancer Maternal Uncle   . Other Sister        intestine burst  . Breast cancer Maternal Aunt        x 2  . Breast cancer Daughter        unknown cancer  . Diabetes Son        x 3  . Diabetes Daughter   . Breast cancer Sister        x2, diagnosed in 36s and 14s  . Prostate cancer Maternal Uncle   . Esophageal cancer Neg Hx   . Rectal cancer Neg Hx   . Stomach cancer Neg Hx    Allergies  Allergen Reactions  . Amoxicillin     Reaction not known  . Atorvastatin     aching  . Cefpodoxime Proxetil     Reaction not known  . Clarithromycin     Reaction not known  . Fosamax [Alendronate Sodium]     Body pain   . Furosemide     REACTION: doesn't help  . Gabapentin Swelling    Caused severe swellling   Current Outpatient Medications on File Prior to Visit  Medication Sig Dispense Refill  . anastrozole (ARIMIDEX) 1 MG tablet Take 1 tablet (1 mg total) by mouth daily. 90 tablet 3  . aspirin EC 81 MG tablet Take 81 mg by mouth daily.      . Cholecalciferol (VITAMIN D-3) 1000 UNITS CAPS Take 1,000 Units by mouth daily.     Marland Kitchen diltiazem (CARDIZEM CD) 120 MG 24 hr capsule Take 1 capsule (120 mg total) by mouth daily. 90 capsule 1  . Docusate Sodium (EQ STOOL SOFTENER PO) Take 100 mg by mouth daily as needed (constipation).     Marland Kitchen doxylamine, Sleep, (EQ SLEEP AID) 25 MG tablet Take 25 mg by mouth at bedtime as needed for sleep.     . hydrochlorothiazide (HYDRODIURIL) 25 MG tablet Take 1 tablet (25 mg total) by mouth daily. 90 tablet 1  . nitroGLYCERIN (NITROSTAT) 0.4 MG SL tablet Place 1 tablet (0.4 mg total) under the tongue every 5 (five) minutes as needed for chest pain (max 3 doses in 15 minutes). 25 tablet 3  . polyethylene glycol  (MIRALAX / GLYCOLAX) packet Take 17 g by mouth daily.     . potassium chloride (KLOR-CON) 10 MEQ tablet Take 1 tablet (10 mEq total) by mouth daily. 90 tablet 1  .  traMADol (ULTRAM) 50 MG tablet Take 1 tablet (50 mg total) by mouth every 6 (six) hours as needed. 30 tablet 0  . vitamin B-12 (CYANOCOBALAMIN) 1000 MCG tablet Take 1,000 mcg by mouth daily.     No current facility-administered medications on file prior to visit.    Review of Systems  Constitutional: Negative for activity change, appetite change, fatigue, fever and unexpected weight change.  HENT: Negative for congestion, ear pain, rhinorrhea, sinus pressure and sore throat.   Eyes: Negative for pain, redness and visual disturbance.  Respiratory: Negative for cough, shortness of breath and wheezing.   Cardiovascular: Negative for chest pain and palpitations.  Gastrointestinal: Negative for abdominal pain, blood in stool, constipation and diarrhea.  Endocrine: Negative for polydipsia and polyuria.  Genitourinary: Negative for dysuria, frequency and urgency.  Musculoskeletal: Positive for arthralgias, back pain and joint swelling. Negative for myalgias and neck pain.  Skin: Negative for pallor and rash.  Allergic/Immunologic: Negative for environmental allergies.  Neurological: Negative for dizziness, syncope and headaches.  Hematological: Negative for adenopathy. Does not bruise/bleed easily.  Psychiatric/Behavioral: Negative for decreased concentration and dysphoric mood. The patient is not nervous/anxious.        Objective:   Physical Exam Constitutional:      General: She is not in acute distress.    Appearance: Normal appearance. She is normal weight. She is not ill-appearing.  Eyes:     General: No scleral icterus.    Conjunctiva/sclera: Conjunctivae normal.     Pupils: Pupils are equal, round, and reactive to light.  Cardiovascular:     Rate and Rhythm: Regular rhythm. Bradycardia present.     Heart sounds: Normal  heart sounds.  Pulmonary:     Effort: Pulmonary effort is normal. No respiratory distress.  Musculoskeletal:     Right wrist: Tenderness and bony tenderness present. No snuff box tenderness or crepitus. Normal pulse.     Right hand: Swelling, tenderness and bony tenderness present. No deformity. Decreased range of motion. Normal sensation. There is no disruption of two-point discrimination. Normal capillary refill.     Cervical back: Normal range of motion and neck supple. No tenderness.     Comments: Tender at base of R metacarpal (radial junction) and distal radius  Slight swelling (but arm may have some lymphedema baseline) no ecchymosis  Pain with pronation and pain with adduction of hand/wrist  Finkelstein's test positive -reproduces pain  No crepitus  No joint erythema or warmth     Lymphadenopathy:     Cervical: No cervical adenopathy.  Skin:    General: Skin is warm.     Findings: No erythema or rash.  Neurological:     Mental Status: She is alert.     Sensory: No sensory deficit.     Motor: No weakness.     Comments: No focal weakness but pain to make a fist with R hand  Psychiatric:        Mood and Affect: Mood normal.           Assessment & Plan:   Problem List Items Addressed This Visit      Cardiovascular and Mediastinum   Chronic diastolic heart failure (HCC)    No clinical changes or medication issues        Other   History of breast cancer    H/o lumpectomy/radioactive seed on R  Now on arimidex that no doubt decreases her sens to pain  Continues oncology f/u Perhaps slight lymphedema in arm  Hand pain, right - Primary    Pain is at the base of the first metacarpal and lateral wrist as well as hand  No neuro changes  Slight swelling (poss mild lymphedema from breast cancer treatment as well)  Recommend ice  Xray now - if OA would not brace Disc poss of De Quervain's tendonitis and handout given  Will try 200-400 mg ibuprofen with food prn  if tolerated  Plan to follow with xray result  May need specialist referral      Relevant Orders   Xray hand complete right   X-ray wrist right PA and lateral

## 2019-10-18 NOTE — Patient Instructions (Addendum)
Continue tylenol  Use ice on thumb/wrist for 10 minutes whenever you can   Avoid heavy work with this hand   You can try ibuprofen over the counter 200-400 mg up to twice daily with food  (advil is the brand name)   If it bothers your stomach-you can't take it   I think you may have arthritis or tendonitis

## 2019-10-18 NOTE — Assessment & Plan Note (Signed)
No clinical changes or medication issues

## 2019-10-18 NOTE — Assessment & Plan Note (Signed)
H/o lumpectomy/radioactive seed on R  Now on arimidex that no doubt decreases her sens to pain  Continues oncology f/u Perhaps slight lymphedema in arm

## 2019-10-18 NOTE — Assessment & Plan Note (Signed)
Pain is at the base of the first metacarpal and lateral wrist as well as hand  No neuro changes  Slight swelling (poss mild lymphedema from breast cancer treatment as well)  Recommend ice  Xray now - if OA would not brace Disc poss of De Quervain's tendonitis and handout given  Will try 200-400 mg ibuprofen with food prn if tolerated  Plan to follow with xray result  May need specialist referral

## 2019-10-19 ENCOUNTER — Telehealth: Payer: Self-pay | Admitting: Family Medicine

## 2019-10-19 DIAGNOSIS — M79641 Pain in right hand: Secondary | ICD-10-CM

## 2019-10-19 NOTE — Telephone Encounter (Signed)
-----   Message from [name], [place] sent at 10/19/2019  8:59 AM EDT ----- Pt notified of xray results and Dr. Marliss Coots comments. Pt agrees with referral to hand specialist. Pt said she saw an ortho Dr. Ninfa Linden (she's not sure what office) and if he does hands she would like to go back to him if not she will see whoever is the hand specialist we choose. Pt would like to see someone in [place] if possible

## 2019-10-22 ENCOUNTER — Other Ambulatory Visit: Payer: Self-pay

## 2019-10-22 MED ORDER — POTASSIUM CHLORIDE CRYS ER 10 MEQ PO TBCR: 10.0000 meq | EXTENDED_RELEASE_TABLET | Freq: Every day | ORAL | 1 refills | Status: DC

## 2019-10-22 MED ORDER — HYDROCHLOROTHIAZIDE 25 MG PO TABS: 25.0000 mg | ORAL_TABLET | Freq: Every day | ORAL | 1 refills | Status: DC

## 2019-10-22 MED ORDER — DILTIAZEM HCL ER COATED BEADS 120 MG PO CP24: 120.0000 mg | ORAL_CAPSULE | Freq: Every day | ORAL | 1 refills | Status: DC

## 2019-10-22 NOTE — Telephone Encounter (Signed)
[facility], stated patient was using [facility] and would like to switch to [facility].

## 2019-10-25 ENCOUNTER — Other Ambulatory Visit: Payer: Self-pay | Admitting: *Deleted

## 2019-10-25 MED ORDER — POTASSIUM CHLORIDE CRYS ER 10 MEQ PO TBCR: 10.0000 meq | EXTENDED_RELEASE_TABLET | Freq: Every day | ORAL | 1 refills | Status: DC

## 2019-10-28 ENCOUNTER — Encounter: Payer: Self-pay | Admitting: Orthopaedic Surgery

## 2019-10-28 ENCOUNTER — Other Ambulatory Visit: Payer: Self-pay

## 2019-10-28 ENCOUNTER — Ambulatory Visit: Payer: Medicare HMO | Admitting: Orthopaedic Surgery

## 2019-10-28 DIAGNOSIS — M1811 Unilateral primary osteoarthritis of first carpometacarpal joint, right hand: Secondary | ICD-10-CM | POA: Diagnosis not present

## 2019-10-28 MED ORDER — LIDOCAINE HCL 1 % IJ SOLN
1.0000 mL | INTRAMUSCULAR | Status: AC | PRN
  Administered 2019-10-28: 1 mL

## 2019-10-28 MED ORDER — METHYLPREDNISOLONE ACETATE 40 MG/ML IJ SUSP
40.0000 mg | INTRAMUSCULAR | Status: AC | PRN
  Administered 2019-10-28: 40 mg

## 2019-10-28 NOTE — Progress Notes (Signed)
Office Visit Note   Patient: Sara [name]           Date of Birth: [birth year]           MRN: [id number] Visit Date: 10/28/2019              Requested by: Tower, Wynelle Fanny, MD [place],  [place] [id number] PCP: Abner Greenspan, MD   Assessment & Plan: Visit Diagnoses:  1. Arthritis of carpometacarpal (CMC) joint of right thumb     Plan: I explained to her in detail what arthritis is at the [facility] joint of her right thumb.  I recommended a steroid injection in this area combined with Voltaren gel.  She agreed to this treatment plan.  She tolerated the injection well.  All question concerns were answered and addressed.  Follow-up can be as needed.  Follow-Up Instructions: Return if symptoms worsen or fail to improve.   Orders:  Orders Placed This Encounter  Procedures  . Hand/UE Inj   No orders of the defined types were placed in this encounter.     Procedures: Hand/UE Inj: R thumb CMC for osteoarthritis on 10/28/2019 4:48 PM Medications: 1 mL lidocaine 1 %; 40 mg methylPREDNISolone acetate 40 MG/ML      Clinical Data: No additional findings.   Subjective: Chief Complaint  Patient presents with  . Right Hand - Pain  The patient is an 86 year old right-hand-dominant female who is referred to [place] for evaluation treatment of right hand pain and known arthritis.  Most of her pain is at the base of her right thumb is where she points to.  She ambulates using a cane.  She says that it does hurt to grip things with that hand.  She denies any injuries.  Her daughter is with her today.  I know her daughter well.  She denies any numbness and tingling in her hand.  HPI  Review of Systems She currently denies any headache, chest pain, shortness of breath, fever, chills, nausea, vomiting  Objective: Vital Signs: There were no vitals taken for this visit.  Physical Exam She is alert and orient x3 and in no acute distress Ortho Exam Examination of both  her hands does show deformities of the IP joints consistent with osteoarthritis.  She does have a positive grind test at the base of the thumb on the right side at the [facility] joint.  The left side has some pain but no grinding.  Her wrist exam is normal.  Her hand is well-perfused with normal sensation. Specialty Comments:  No specialty comments available.  Imaging: No results found. X-rays on the canopy system reviewed of her hand and wrist show significant arthritis at the [facility] joint on the right thumb.  There is arthritis in the IP joints of the fingers as well.  Most of this is severe arthritis though it is the basilar thumb joint of the right hand.  PMFS History: Patient Active Problem List   Diagnosis Date Noted  . Hand pain, right 10/18/2019  . Family history of breast cancer   . Family history of prostate cancer   . Family history of lung cancer   . Carcinoma of upper-outer quadrant of right breast in female, estrogen receptor positive ([place]) 02/23/2019  . Pre-operative clearance 02/21/2019  . History of breast cancer 02/21/2019  . Medicare annual wellness visit, subsequent 01/05/2019  . Encounter for screening mammogram for breast cancer 01/05/2019  . Knee pain, left 10/14/2018  .  Arthritis of left hip 07/29/2018  . Bradycardia 07/01/2018  . Dyspnea 07/01/2018  . Rhinorrhea 01/05/2018  . Chronic diastolic heart failure ([place]) 12/27/2017  . Lumbar spinal stenosis 03/11/2017  . Diastolic dysfunction 123456  . Estrogen deficiency 12/16/2016  . Routine general medical examination at a health care facility 12/16/2016  . CAD (coronary artery disease) 12/04/2016  . Dyspepsia 03/15/2016  . 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 Eczema 03/06/2015  . Prediabetes 02/11/2014  . Intertrigo labialis 12/07/2012  . CONSTIPATION, CHRONIC 07/03/2010  . Osteoporosis 06/24/2008  . HYPERCHOLESTEROLEMIA 12/08/2006  . Essential hypertension 12/08/2006  . GERD 12/08/2006  . H/O herpes zoster  12/08/2006   Past Medical History:  Diagnosis Date  . Allergy   . CAD (coronary artery disease)   . Chronic diastolic (congestive) heart failure ([place])   . Clinical trial participant    [facility] studies in familial dementia  . Dyspnea 11/28/2016  . Family history of breast cancer   . Family history of lung cancer   . Family history of prostate cancer   . Frequent UTI   . GERD (gastroesophageal reflux disease)   . Hypertension   . Osteoarthritis    hips, back  . Osteopenia   . Pneumonia   . Spinal stenosis   . Stroke [facility]) 1985    Family History  Problem Relation Age of Onset  . Lung cancer Brother 65  . Other Mother        kidney tumor  . Hypertension Mother   . Diabetes Sister   . Diabetes Brother   . Diabetes Sister        x 2  . Colon cancer Maternal Uncle   . Other Sister        intestine burst  . Breast cancer Maternal Aunt        x 2  . Breast cancer Daughter        unknown cancer  . Diabetes Son        x 3  . Diabetes Daughter   . Breast cancer Sister        x2, diagnosed in 92s and 51s  . Prostate cancer Maternal Uncle   . Esophageal cancer Neg Hx   . Rectal cancer Neg Hx   . Stomach cancer Neg Hx     Past Surgical History:  Procedure Laterality Date  . ABDOMINAL HYSTERECTOMY     partial has one ovary  . APPENDECTOMY    . BREAST LUMPECTOMY WITH RADIOACTIVE SEED LOCALIZATION Right 04/07/2019   Procedure: RIGHT BREAST LUMPECTOMY WITH RADIOACTIVE SEED LOCALIZATION;  Surgeon: Jovita Kussmaul, MD;  Location: [facility];  Service: General;  Laterality: Right;  . CATARACT EXTRACTION Bilateral   . LEFT HEART CATH AND CORONARY ANGIOGRAPHY N/A 11/29/2016   Procedure: Left Heart Cath and Coronary Angiography;  Surgeon: Sherren Mocha, MD;  Location: [facility];  Service: Cardiovascular;  Laterality: N/A;  . LUMBAR DISC SURGERY     x 3  . rotator cuff surgery Right 2010   Social History   Occupational History  . Occupation: RETIRED     Employer: RETIRED  Tobacco Use  . Smoking status: Never Smoker  . Smokeless tobacco: Never Used  Substance and Sexual Activity  . Alcohol use: No    Alcohol/week: 0.0 standard drinks  . Drug use: No  . Sexual activity: Not on file

## 2019-11-16 ENCOUNTER — Emergency Department (HOSPITAL_COMMUNITY): Payer: Medicare HMO

## 2019-11-16 ENCOUNTER — Emergency Department (HOSPITAL_COMMUNITY)
Admission: EM | Admit: 2019-11-16 | Discharge: 2019-11-16 | Disposition: A | Discharge status: Home / Self Care | Payer: Medicare HMO | Attending: Emergency Medicine | Admitting: Emergency Medicine

## 2019-11-16 ENCOUNTER — Encounter (HOSPITAL_COMMUNITY): Payer: Self-pay | Admitting: Emergency Medicine

## 2019-11-16 DIAGNOSIS — R079 Chest pain, unspecified: Secondary | ICD-10-CM | POA: Insufficient documentation

## 2019-11-16 DIAGNOSIS — I2510 Atherosclerotic heart disease of native coronary artery without angina pectoris: Secondary | ICD-10-CM | POA: Insufficient documentation

## 2019-11-16 DIAGNOSIS — I10 Essential (primary) hypertension: Secondary | ICD-10-CM | POA: Diagnosis not present

## 2019-11-16 DIAGNOSIS — I5032 Chronic diastolic (congestive) heart failure: Secondary | ICD-10-CM | POA: Diagnosis not present

## 2019-11-16 DIAGNOSIS — R0609 Other forms of dyspnea: Secondary | ICD-10-CM | POA: Diagnosis not present

## 2019-11-16 DIAGNOSIS — I110 Hypertensive heart disease with heart failure: Secondary | ICD-10-CM | POA: Insufficient documentation

## 2019-11-16 DIAGNOSIS — R0600 Dyspnea, unspecified: Secondary | ICD-10-CM | POA: Insufficient documentation

## 2019-11-16 DIAGNOSIS — Z9861 Coronary angioplasty status: Secondary | ICD-10-CM | POA: Diagnosis not present

## 2019-11-16 DIAGNOSIS — Z8673 Personal history of transient ischemic attack (TIA), and cerebral infarction without residual deficits: Secondary | ICD-10-CM | POA: Diagnosis not present

## 2019-11-16 DIAGNOSIS — R42 Dizziness and giddiness: Secondary | ICD-10-CM | POA: Insufficient documentation

## 2019-11-16 DIAGNOSIS — R0602 Shortness of breath: Secondary | ICD-10-CM | POA: Diagnosis present

## 2019-11-16 DIAGNOSIS — J984 Other disorders of lung: Secondary | ICD-10-CM | POA: Diagnosis not present

## 2019-11-16 DIAGNOSIS — J939 Pneumothorax, unspecified: Secondary | ICD-10-CM | POA: Diagnosis not present

## 2019-11-16 DIAGNOSIS — R0789 Other chest pain: Secondary | ICD-10-CM | POA: Diagnosis not present

## 2019-11-16 LAB — BASIC METABOLIC PANEL
Anion gap: 9 (ref 5–15)
BUN: 20 mg/dL (ref 8–23)
CO2: 29 mmol/L (ref 22–32)
Calcium: 9.8 mg/dL (ref 8.9–10.3)
Chloride: 103 mmol/L (ref 98–111)
Creatinine, Ser: 0.87 mg/dL (ref 0.44–1.00)
GFR calc Af Amer: >60 mL/min (ref >60)
GFR calc non Af Amer: >60 mL/min (ref >60)
Glucose, Bld: 107 mg/dL — ABNORMAL HIGH (ref 70–99)
Potassium: 3.7 mmol/L (ref 3.5–5.1)
Sodium: 141 mmol/L (ref 135–145)

## 2019-11-16 LAB — CBC
HCT: 42.7 % (ref 36.0–46.0)
Hemoglobin: 13.2 g/dL (ref 12.0–15.0)
MCH: 28 pg (ref 26.0–34.0)
MCHC: 30.9 g/dL (ref 30.0–36.0)
MCV: 90.5 fL (ref 80.0–100.0)
Platelets: 195 10*3/uL (ref 150–400)
RBC: 4.72 MIL/uL (ref 3.87–5.11)
RDW: 13.1 % (ref 11.5–15.5)
WBC: 8.7 10*3/uL (ref 4.0–10.5)
nRBC: 0 % (ref 0.0–0.2)

## 2019-11-16 LAB — D-DIMER, QUANTITATIVE: D-Dimer, Quant: 0.68 ug/mL-FEU — ABNORMAL HIGH (ref 0.00–0.50)

## 2019-11-16 LAB — TROPONIN I (HIGH SENSITIVITY)
Troponin I (High Sensitivity): 16 ng/L (ref <18)
Troponin I (High Sensitivity): 16 ng/L (ref <18)

## 2019-11-16 MED ORDER — ASPIRIN 81 MG PO CHEW
324.0000 mg | CHEWABLE_TABLET | Freq: Once | ORAL | Status: AC
  Administered 2019-11-16: 324 mg via ORAL
  Filled 2019-11-16: qty 4

## 2019-11-16 MED ORDER — SODIUM CHLORIDE 0.9% FLUSH: 3.0000 mL | Freq: Once | INTRAVENOUS | Status: DC

## 2019-11-16 NOTE — ED Triage Notes (Signed)
Pt reports earlier today having SOB, chest tightness and feels nauseated. Took 3 Nitro without relief.

## 2019-11-16 NOTE — Discharge Instructions (Addendum)
You were evaluated in the Emergency Department and after careful evaluation, we did not find any emergent condition requiring admission or further testing in the hospital.  Your exam/testing today is overall reassuring.  As discussed, please follow-up with your cardiologist Dr. Marlou Porch for possible outpatient stress testing.  Please return to the Emergency Department if you experience any worsening of your condition.  We encourage you to follow up with a primary care provider.  Thank you for allowing [place] to be a part of your care.

## 2019-11-16 NOTE — ED Triage Notes (Signed)
Pt adds dizziness for a couple days but got worse today.

## 2019-11-16 NOTE — ED Notes (Signed)
Ambulatory O2 sats started at 95% and only dropped to 93%

## 2019-11-16 NOTE — ED Provider Notes (Signed)
[facility] Emergency Department Provider Note MRN:  [id number]  Arrival date & time: 11/16/19     Chief Complaint   Shortness of Breath, Chest Pain, and Dizziness   History of Present Illness   Sara [name] is a 86 y.o. year-old female with a history of CAD, CHF, breast cancer, stroke presenting to the ED with chief complaint of shortness of breath.  2 or 3 days of shortness of breath, worse with ambulation, worse with laying flat.  Also endorsing some mild productive cough for the past 2 days.  This morning woke with chest pain described as a mild tightness.  Goes to the back, goes to the jaw.  Pain is not worsened by deep breaths.  Denies fever.  Denies abdominal pain.  Chest tightness associated with lightheadedness as well as nausea.  Review of Systems  A complete 10 system review of systems was obtained and all systems are negative except as noted in the HPI and PMH.   Patient's Health History    Past Medical History:  Diagnosis Date  . Allergy   . CAD (coronary artery disease)   . Chronic diastolic (congestive) heart failure ([place])   . Clinical trial participant    [facility] studies in familial dementia  . Dyspnea 11/28/2016  . Family history of breast cancer   . Family history of lung cancer   . Family history of prostate cancer   . Frequent UTI   . GERD (gastroesophageal reflux disease)   . Hypertension   . Osteoarthritis    hips, back  . Osteopenia   . Pneumonia   . Spinal stenosis   . Stroke [facility]) 1985    Past Surgical History:  Procedure Laterality Date  . ABDOMINAL HYSTERECTOMY     partial has one ovary  . APPENDECTOMY    . BREAST LUMPECTOMY WITH RADIOACTIVE SEED LOCALIZATION Right 04/07/2019   Procedure: RIGHT BREAST LUMPECTOMY WITH RADIOACTIVE SEED LOCALIZATION;  Surgeon: Jovita Kussmaul, MD;  Location: [place];  Service: General;  Laterality: Right;  . CATARACT EXTRACTION Bilateral   . LEFT HEART CATH AND  CORONARY ANGIOGRAPHY N/A 11/29/2016   Procedure: Left Heart Cath and Coronary Angiography;  Surgeon: Sherren Mocha, MD;  Location: [place] CV LAB;  Service: Cardiovascular;  Laterality: N/A;  . LUMBAR DISC SURGERY     x 3  . rotator cuff surgery Right 2010    Family History  Problem Relation Age of Onset  . Lung cancer Brother 64  . Other Mother        kidney tumor  . Hypertension Mother   . Diabetes Sister   . Diabetes Brother   . Diabetes Sister        x 2  . Colon cancer Maternal Uncle   . Other Sister        intestine burst  . Breast cancer Maternal Aunt        x 2  . Breast cancer Daughter        unknown cancer  . Diabetes Son        x 3  . Diabetes Daughter   . Breast cancer Sister        x2, diagnosed in 67s and 71s  . Prostate cancer Maternal Uncle   . Esophageal cancer Neg Hx   . Rectal cancer Neg Hx   . Stomach cancer Neg Hx     Social History   Socioeconomic History  . Marital status: Married  Spouse name: Not on file  . Number of children: 6  . Years of education: Not on file  . Highest education level: Not on file  Occupational History  . Occupation: RETIRED    Employer: RETIRED  Tobacco Use  . Smoking status: Never Smoker  . Smokeless tobacco: Never Used  Substance and Sexual Activity  . Alcohol use: No    Alcohol/week: 0.0 standard drinks  . Drug use: No  . Sexual activity: Not on file  Other Topics Concern  . Not on file  Social History Narrative  . Not on file   Social Determinants of Health   Financial Resource Strain:   . Difficulty of Paying Living Expenses:   Food Insecurity:   . Worried About Charity fundraiser in the Last Year:   . Arboriculturist in the Last Year:   Transportation Needs: No Transportation Needs  . Lack of Transportation (Medical): No  . Lack of Transportation (Non-Medical): No  Physical Activity:   . Days of Exercise per Week:   . Minutes of Exercise per Session:   Stress:   . Feeling of Stress :     Social Connections:   . Frequency of Communication with Friends and Family:   . Frequency of Social Gatherings with Friends and Family:   . Attends Religious Services:   . Active Member of Clubs or Organizations:   . Attends Archivist Meetings:   Marland Kitchen Marital Status:   Intimate Partner Violence: Not At Risk  . Fear of Current or Ex-Partner: No  . Emotionally Abused: No  . Physically Abused: No  . Sexually Abused: No     Physical Exam   Vitals:   11/16/19 2000 11/16/19 2030  BP: (!) 142/79 (!) 157/99  Pulse: 67 78  Resp: (!) 22 15  Temp:    SpO2: 97% 92%    CONSTITUTIONAL: Well-appearing, NAD NEURO:  Alert and oriented x 3, no focal deficits EYES:  eyes equal and reactive ENT/NECK:  no LAD, no JVD CARDIO: Regular rate, well-perfused, normal S1 and S2 PULM:  CTAB no wheezing or rhonchi GI/GU:  normal bowel sounds, non-distended, non-tender MSK/SPINE:  No gross deformities, no edema SKIN:  no rash, atraumatic PSYCH:  Appropriate speech and behavior  *Additional and/or pertinent findings included in MDM below  Diagnostic and Interventional Summary    EKG Interpretation  Date/Time:  Tuesday November 16 2019 15:03:07 EDT Ventricular Rate:  77 PR Interval:    QRS Duration: 104 QT Interval:  406 QTC Calculation: 460 R Axis:   -65 Text Interpretation: Sinus rhythm Ventricular premature complex LAD, consider left anterior fascicular block Baseline wander in lead(s) III V5 12 Lead; Mason-Likar Confirmed by Gerlene Fee [phone]) on 11/16/2019 3:49:11 PM      Labs Reviewed  BASIC METABOLIC PANEL - Abnormal; Notable for the following components:      Result Value   Glucose, Bld 107 (*)    All other components within normal limits  D-DIMER, QUANTITATIVE (NOT AT [facility]) - Abnormal; Notable for the following components:   D-Dimer, Quant 0.68 (*)    All other components within normal limits  CBC  TROPONIN I (HIGH SENSITIVITY)  TROPONIN I (HIGH SENSITIVITY)    DG Chest  2 View  Final Result      Medications  sodium chloride flush (NS) 0.9 % injection 3 mL (has no administration in time range)  aspirin chewable tablet 324 mg (324 mg Oral Given 11/16/19 1645)  Procedures  /  Critical Care Procedures  ED Course and Medical Decision Making  I have reviewed the triage vital signs, the nursing notes, and pertinent available records from the EMR.  Listed above are laboratory and imaging tests that I personally ordered, reviewed, and interpreted and then considered in my medical decision making (see below for details).      Concern for ACS given patient's history of CAD and description of symptoms.  EKG is without acute concerns, troponin is pending.  PE also considered given patient's cancer history, screening with D-dimer.  D-dimer is negative when age-adjusted, troponin is negative x2.  Patient continues to look and feel well with normal vital signs, EKG is without change from prior.  Patient is appropriate for close cardiology follow-up.  Barth Kirks. Sedonia Small, MD [place] [email or web address]  Final Clinical Impressions(s) / ED Diagnoses     ICD-10-CM   1. DOE (dyspnea on exertion)  R06.00   2. Chest pain, unspecified type  R07.9     ED Discharge Orders    None       Discharge Instructions Discussed with and Provided to Patient:     Discharge Instructions     You were evaluated in the Emergency Department and after careful evaluation, we did not find any emergent condition requiring admission or further testing in the hospital.  Your exam/testing today is overall reassuring.  As discussed, please follow-up with your cardiologist Dr. Marlou Porch for possible outpatient stress testing.  Please return to the Emergency Department if you experience any worsening of your condition.  We encourage you to follow up with a primary care provider.  Thank you for allowing [place] to be a part of your care.        Maudie Flakes, MD 11/16/19 2134

## 2019-11-17 ENCOUNTER — Telehealth: Payer: Self-pay | Admitting: Cardiology

## 2019-11-17 DIAGNOSIS — R0789 Other chest pain: Secondary | ICD-10-CM

## 2019-11-17 DIAGNOSIS — R0600 Dyspnea, unspecified: Secondary | ICD-10-CM

## 2019-11-17 NOTE — Telephone Encounter (Signed)
Called and spoke to patient. Made her aware of Dr. Kingsley Plan recommendations to have a lexiscan and echocardiogram. Reviewed instructions with the patient. She is aware that she will be contacted by scheduling for appointments. Patient verbalized understanding and thanked me for the call.

## 2019-11-17 NOTE — Telephone Encounter (Addendum)
Reviewed ER visit.  Lets go ahead and set her up for an echocardiogram-dyspnea, Lexiscan stress test-chest tightness. Candee Furbish, MD   ----- Message from Maudie Flakes, MD sent at 11/16/2019  9:34 PM EDT ----- Regarding: ED chest pain Dr. Alfonzo Feller of yours here in the emergency department for chest tightness 6-8.  Some dyspnea exertion, cough as well.  History of breast cancer.  Overall very well-appearing and stable, EKG without changes, troponin negative x2, D-dimer negative when age-adjusted.  We discussed management options, and she agreed to follow-up with you or call your office for possible outpatient stress testing as she has not had ischemic evaluation for few years.  Hoping you can either reach out to her or expect her call thank you.  Barth Kirks. Sedonia Small, [place] [facility] [email or web address]

## 2019-11-22 ENCOUNTER — Ambulatory Visit (INDEPENDENT_AMBULATORY_CARE_PROVIDER_SITE_OTHER): Payer: Medicare HMO | Admitting: Family Medicine

## 2019-11-22 ENCOUNTER — Encounter: Payer: Self-pay | Admitting: Family Medicine

## 2019-11-22 ENCOUNTER — Other Ambulatory Visit: Payer: Self-pay

## 2019-11-22 VITALS — BP 122/74 | HR 61 | Temp 97.2°F | Ht 62.0 in | Wt 147.4 lb

## 2019-11-22 DIAGNOSIS — I5032 Chronic diastolic (congestive) heart failure: Secondary | ICD-10-CM

## 2019-11-22 DIAGNOSIS — R109 Unspecified abdominal pain: Secondary | ICD-10-CM

## 2019-11-22 DIAGNOSIS — R0600 Dyspnea, unspecified: Secondary | ICD-10-CM

## 2019-11-22 DIAGNOSIS — I10 Essential (primary) hypertension: Secondary | ICD-10-CM

## 2019-11-22 DIAGNOSIS — Z853 Personal history of malignant neoplasm of breast: Secondary | ICD-10-CM

## 2019-11-22 DIAGNOSIS — R10A0 Flank pain, unspecified side: Secondary | ICD-10-CM | POA: Insufficient documentation

## 2019-11-22 DIAGNOSIS — I2510 Atherosclerotic heart disease of native coronary artery without angina pectoris: Secondary | ICD-10-CM | POA: Diagnosis not present

## 2019-11-22 LAB — POC URINALSYSI DIPSTICK (AUTOMATED)
Bilirubin, UA: NEGATIVE
Blood, UA: NEGATIVE
Glucose, UA: NEGATIVE
Ketones, UA: NEGATIVE
Leukocytes, UA: NEGATIVE
Nitrite, UA: NEGATIVE
Protein, UA: NEGATIVE
Spec Grav, UA: >=1.03 — AB (ref 1.010–1.025)
Urobilinogen, UA: 0.2 E.U./dL
pH, UA: 6 (ref 5.0–8.0)

## 2019-11-22 NOTE — Assessment & Plan Note (Signed)
I do wonder if this is the cause of ongoing sob with exertion  Has echocardiogram planned early next month as well as myoview Reassuring exam and vitals today

## 2019-11-22 NOTE — Assessment & Plan Note (Signed)
Some ongoing fatigue ever since treatment  On anastozole- I do wonder if this causes some of her symptoms

## 2019-11-22 NOTE — Assessment & Plan Note (Signed)
bp in fair control at this time  BP Readings from Last 1 Encounters:  11/22/19 122/74   No changes needed Most recent labs reviewed  Disc lifstyle change with low sodium diet and exercise

## 2019-11-22 NOTE — Assessment & Plan Note (Signed)
Pt thinks this is due to her chronic back pain on L  Uses cane ua clear but concentrated (disc need for more water)

## 2019-11-22 NOTE — Progress Notes (Signed)
Subjective:    Patient ID: Sara [name], female    DOB: 10-12-1931, 87 y.o.   MRN: [id number]  This visit occurred during the SARS-CoV-2 public health emergency.  Safety protocols were in place, including screening questions prior to the visit, additional usage of staff PPE, and extensive cleaning of exam room while observing appropriate contact time as indicated for disinfecting solutions.    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, music)     EKG: Text Interpretation: Sinus rhythm Ventricular premature complex LAD, consider left anterior fascicular block Baseline wander in lead(s) III V5 12 Lead; Mason-Likar Confirmed by Gerlene Fee [phone]) on 11/16/2019 3:49:11 PM  Labs Lab Results  Component Value Date   CREATININE 0.87 11/16/2019   BUN 20 11/16/2019   NA 141 11/16/2019   K 3.7 11/16/2019   CL 103 11/16/2019   CO2 29 11/16/2019   Glucose of 107 DDimer of 0.68-neg Lab Results  Component Value Date   WBC 8.7 11/16/2019   HGB 13.2 11/16/2019   HCT 42.7 11/16/2019   MCV 90.5 11/16/2019   PLT 195 11/16/2019   Troponin neg times 2 (adjusted)   She was given chewable asa at arrival  Overall exam and testing were re assuring  Enc to consider f/u with cardiology Dr Marlou Porch  BP Readings from Last 3 Encounters:  11/22/19 122/74  11/16/19 (!) 157/99  10/18/19 130/84   Pulse Readings from Last 3 Encounters:  11/22/19 61  11/16/19 78  10/18/19 (!) 50   DG Chest 2 View  Result Date: 11/16/2019 CLINICAL DATA:  Shortness of breath, dizziness and chest tightness. EXAM: CHEST - 2 VIEW COMPARISON:  November 28, 2016 FINDINGS: There is no evidence of acute  infiltrate, pleural effusion or pneumothorax. Small radiopaque surgical clips are seen overlying the right lung base. The heart size and mediastinal contours are within normal limits. There is moderate severity calcification of the aortic arch. The visualized skeletal structures are unremarkable. IMPRESSION: No active cardiopulmonary disease. Electronically Signed   By: Virgina Norfolk M.D.   On: 11/16/2019 17:43   Today she feels ok  Just fatigued and short of breath - (this has gone on a good while however)  Has to rest after doing anything  More fatigued  and sob since her surgery for breast cancer  (somewhat before)  She takes anastrozole as well   She has diastolic dysfunction  Has stress test and echocardiogram   Results for orders placed or performed in visit on 11/22/19  POCT Urinalysis Dipstick (Automated)  Result Value Ref Range   Color, UA Yellow    Clarity, UA Hazy    Glucose, UA Negative Negative   Bilirubin, UA Negative    Ketones, UA Negative    Spec Grav, UA >=1.030 (A) 1.010 - 1.025   Blood, UA Negative    pH, UA 6.0 5.0 - 8.0   Protein, UA Negative Negative   Urobilinogen, UA 0.2 0.2 or 1.0 E.U./dL   Nitrite, UA Negative    Leukocytes, UA Negative Negative     Pulse ox 97%  Patient Active Problem List   Diagnosis Date Noted  . Hand pain, right 10/18/2019  . Family history of breast cancer   . Family history of prostate cancer   . Family history of lung cancer   . Carcinoma of upper-outer quadrant of right breast in female, estrogen receptor positive ([place]) 02/23/2019  . Pre-operative clearance 02/21/2019  . History of breast cancer 02/21/2019  . Medicare annual wellness visit, subsequent 01/05/2019  . Encounter for screening mammogram for breast cancer 01/05/2019  . Knee pain, left 10/14/2018  . Arthritis of left hip 07/29/2018  . Bradycardia 07/01/2018  . Dyspnea 07/01/2018  . Rhinorrhea 01/05/2018  . Chronic diastolic heart failure ([place]) 12/27/2017   . Lumbar spinal stenosis 03/11/2017  . Diastolic dysfunction 62/37/6283  . Estrogen deficiency 12/16/2016  . Routine general medical examination at a health care facility 12/16/2016  . CAD (coronary artery disease) 12/04/2016  . Dyspepsia 03/15/2016  . 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 Eczema 03/06/2015  . Prediabetes 02/11/2014  . Intertrigo labialis 12/07/2012  . CONSTIPATION, CHRONIC 07/03/2010  . Osteoporosis 06/24/2008  . HYPERCHOLESTEROLEMIA 12/08/2006  . Essential hypertension 12/08/2006  . GERD 12/08/2006  . H/O herpes zoster 12/08/2006   Past Medical History:  Diagnosis Date  . Allergy   . CAD (coronary artery disease)   . Chronic diastolic (congestive) heart failure ([place])   . Clinical trial participant    [facility] studies in familial dementia  . Dyspnea 11/28/2016  . Family history of breast cancer   . Family history of lung cancer   . Family history of prostate cancer   . Frequent UTI   . GERD (gastroesophageal reflux disease)   . Hypertension   . Osteoarthritis    hips, back  . Osteopenia   . Pneumonia   . Spinal stenosis   . Stroke [facility]) 1985   Past Surgical History:  Procedure Laterality Date  . ABDOMINAL HYSTERECTOMY     partial has one ovary  . APPENDECTOMY    . BREAST LUMPECTOMY WITH RADIOACTIVE SEED LOCALIZATION Right 04/07/2019   Procedure: RIGHT BREAST LUMPECTOMY WITH RADIOACTIVE SEED LOCALIZATION;  Surgeon: Jovita Kussmaul, MD;  Location: [place];  Service: General;  Laterality: Right;  . CATARACT EXTRACTION Bilateral   . LEFT HEART CATH AND CORONARY ANGIOGRAPHY N/A 11/29/2016   Procedure: Left Heart Cath and Coronary Angiography;  Surgeon: Sherren Mocha, MD;  Location: [place] CV LAB;  Service: Cardiovascular;  Laterality: N/A;  . LUMBAR DISC SURGERY     x 3  . rotator cuff surgery Right 2010   Social History   Tobacco Use  . Smoking status: Never Smoker  . Smokeless tobacco: Never Used  Vaping Use  . Vaping Use: Never used   Substance Use Topics  . Alcohol use: No    Alcohol/week: 0.0 standard drinks  . Drug use: No   Family History  Problem Relation Age of Onset  . Lung cancer Brother 38  . Other Mother        kidney tumor  . Hypertension Mother   . Diabetes Sister   . Diabetes Brother   . Diabetes Sister        x 2  . Colon cancer Maternal Uncle   . Other Sister        intestine burst  . Breast cancer Maternal Aunt        x 2  . Breast cancer Daughter        unknown cancer  . Diabetes  Son        x 3  . Diabetes Daughter   . Breast cancer Sister        x2, diagnosed in 64s and 41s  . Prostate cancer Maternal Uncle   . Esophageal cancer Neg Hx   . Rectal cancer Neg Hx   . Stomach cancer Neg Hx    Allergies  Allergen Reactions  . Amoxicillin     Reaction not known  . Atorvastatin     aching  . Cefpodoxime Proxetil     Reaction not known  . Clarithromycin     Reaction not known  . Fosamax [Alendronate Sodium]     Body pain   . Furosemide     REACTION: doesn't help  . Gabapentin Swelling    Caused severe swellling   Current Outpatient Medications on File Prior to Visit  Medication Sig Dispense Refill  . acetaminophen (TYLENOL) 500 MG tablet Take 1,000 mg by mouth every 6 (six) hours as needed.    Marland Kitchen anastrozole (ARIMIDEX) 1 MG tablet Take 1 tablet (1 mg total) by mouth daily. 90 tablet 3  . aspirin EC 81 MG tablet Take 81 mg by mouth daily.      . Cholecalciferol (VITAMIN D-3) 1000 UNITS CAPS Take 1,000 Units by mouth daily.     Marland Kitchen diltiazem (CARDIZEM CD) 120 MG 24 hr capsule Take 1 capsule (120 mg total) by mouth daily. 90 capsule 1  . Docusate Sodium (EQ STOOL SOFTENER PO) Take 100 mg by mouth daily as needed (constipation).     Marland Kitchen doxylamine, Sleep, (EQ SLEEP AID) 25 MG tablet Take 25 mg by mouth at bedtime as needed for sleep.     . hydrochlorothiazide (HYDRODIURIL) 25 MG tablet Take 1 tablet (25 mg total) by mouth daily. 90 tablet 1  . nitroGLYCERIN (NITROSTAT) 0.4 MG SL tablet  Place 1 tablet (0.4 mg total) under the tongue every 5 (five) minutes as needed for chest pain (max 3 doses in 15 minutes). 25 tablet 3  . polyethylene glycol (MIRALAX / GLYCOLAX) packet Take 17 g by mouth daily.     . potassium chloride (KLOR-CON) 10 MEQ tablet Take 1 tablet (10 mEq total) by mouth daily. 90 tablet 1  . traMADol (ULTRAM) 50 MG tablet Take 1 tablet (50 mg total) by mouth every 6 (six) hours as needed. (Patient taking differently: Take 50 mg by mouth every 6 (six) hours as needed for moderate pain. ) 30 tablet 0  . vitamin B-12 (CYANOCOBALAMIN) 1000 MCG tablet Take 1,000 mcg by mouth daily.     No current facility-administered medications on file prior to visit.     Review of Systems  Constitutional: Positive for fatigue. Negative for activity change, appetite change, fever and unexpected weight change.       Wears out more easily than in the past  HENT: Negative for congestion, ear pain, rhinorrhea, sinus pressure and sore throat.   Eyes: Negative for pain, redness and visual disturbance.  Respiratory: Positive for shortness of breath. Negative for cough, chest tightness, wheezing and stridor.   Cardiovascular: Negative for chest pain, palpitations and leg swelling.  Gastrointestinal: Negative for abdominal pain, blood in stool, constipation, diarrhea and nausea.  Endocrine: Negative for polydipsia and polyuria.  Genitourinary: Negative for dysuria, frequency and urgency.  Musculoskeletal: Negative for arthralgias, back pain and myalgias.  Skin: Negative for pallor and rash.  Allergic/Immunologic: Negative for environmental allergies.  Neurological: Negative for dizziness, syncope, numbness and headaches.  Hematological:  Negative for adenopathy. Does not bruise/bleed easily.  Psychiatric/Behavioral: Negative for decreased concentration and dysphoric mood. The patient is not nervous/anxious.        Objective:   Physical Exam Constitutional:      General: She is not in  acute distress.    Appearance: Normal appearance. She is well-developed. She is not ill-appearing or diaphoretic.  HENT:     Head: Normocephalic and atraumatic.  Eyes:     General: No scleral icterus.    Conjunctiva/sclera: Conjunctivae normal.     Pupils: Pupils are equal, round, and reactive to light.  Neck:     Thyroid: No thyromegaly.     Vascular: No carotid bruit or JVD.  Cardiovascular:     Rate and Rhythm: Normal rate and regular rhythm.     Pulses: Normal pulses.     Heart sounds: Normal heart sounds. No gallop.   Pulmonary:     Effort: Pulmonary effort is normal. No respiratory distress.     Breath sounds: Normal breath sounds. No wheezing or rales.     Comments: Some mild tenderness R chest wall (per pt from her breast cancer tx) Abdominal:     General: Bowel sounds are normal. There is no distension or abdominal bruit.     Palpations: Abdomen is soft. There is no mass.     Tenderness: There is no abdominal tenderness. There is no right CVA tenderness or left CVA tenderness.  Musculoskeletal:     Cervical back: Normal range of motion and neck supple.     Right lower leg: No edema.     Left lower leg: No edema.     Comments: No leg tenderness or swelling No palpable cords   Lymphadenopathy:     Cervical: No cervical adenopathy.  Skin:    General: Skin is warm and dry.     Coloration: Skin is not pale.     Findings: No erythema or rash.  Neurological:     Mental Status: She is alert. Mental status is at baseline.     Sensory: No sensory deficit.     Coordination: Coordination normal.     Deep Tendon Reflexes: Reflexes are normal and symmetric. Reflexes normal.     Comments: Walks well with a cane           Assessment & Plan:   Problem List Items Addressed This Visit      Cardiovascular and Mediastinum   Essential hypertension    bp in fair control at this time  BP Readings from Last 1 Encounters:  11/22/19 122/74   No changes needed Most recent labs  reviewed  Disc lifstyle change with low sodium diet and exercise        CAD (coronary artery disease)    Recent visit to ER for sob/chest tightness was reassuring  Reviewed hospital records, lab results and studies in detail   Has appt upcoming for echo and myoview       Chronic diastolic heart failure ([place])    I do wonder if this is the cause of ongoing sob with exertion  Has echocardiogram planned early next month as well as myoview Reassuring exam and vitals today        Other   Dyspnea - Primary    With recent ER visit Reviewed hospital records, lab results and studies in detail  -reassuring  Also reassuring exam and vitals today  Upcoming echo and myoview to assess cardiac status/ has diastolic dysfunction  History of breast cancer    Some ongoing fatigue ever since treatment  On anastozole- I do wonder if this causes some of her symptoms       Flank pain    Pt thinks this is due to her chronic back pain on L  Uses cane ua clear but concentrated (disc need for more water)       Relevant Orders   POCT Urinalysis Dipstick (Automated) (Completed)

## 2019-11-22 NOTE — Assessment & Plan Note (Signed)
With recent ER visit Reviewed hospital records, lab results and studies in detail  -reassuring  Also reassuring exam and vitals today  Upcoming echo and myoview to assess cardiac status/ has diastolic dysfunction

## 2019-11-22 NOTE — Assessment & Plan Note (Signed)
Recent visit to ER for sob/chest tightness was reassuring  Reviewed hospital records, lab results and studies in detail   Has appt upcoming for echo and myoview

## 2019-11-22 NOTE — Patient Instructions (Signed)
Go ahead and get those cardiac tests as planned   You may be short of breath from diastolic dysfunction -not uncommon   Also your anastazole medication may make you tired  Urine is clear today but concentrated (you probably need to drink more fluids)   Keep Sara [name] posted

## 2019-12-06 ENCOUNTER — Telehealth (HOSPITAL_COMMUNITY): Payer: Self-pay | Admitting: *Deleted

## 2019-12-06 NOTE — Telephone Encounter (Signed)
Patient given detailed instructions per Myocardial Perfusion Study Information Sheet for the test on 12/10/19 at 10:15. Patient notified to arrive 15 minutes early and that it is imperative to arrive on time for appointment to keep from having the test rescheduled.  If you need to cancel or reschedule your appointment, please call the office within 24 hours of your appointment. . Patient verbalized understanding.Sara [name]

## 2019-12-10 ENCOUNTER — Other Ambulatory Visit: Payer: Self-pay

## 2019-12-10 ENCOUNTER — Ambulatory Visit (HOSPITAL_BASED_OUTPATIENT_CLINIC_OR_DEPARTMENT_OTHER): Payer: Medicare HMO

## 2019-12-10 ENCOUNTER — Ambulatory Visit (HOSPITAL_COMMUNITY): Payer: Medicare HMO | Attending: Cardiovascular Disease

## 2019-12-10 DIAGNOSIS — R0600 Dyspnea, unspecified: Secondary | ICD-10-CM

## 2019-12-10 DIAGNOSIS — R0789 Other chest pain: Secondary | ICD-10-CM | POA: Insufficient documentation

## 2019-12-10 LAB — MYOCARDIAL PERFUSION IMAGING
LV dias vol: 76 mL (ref 46–106)
LV sys vol: 32 mL
Peak HR: 80 {beats}/min
Rest HR: 55 {beats}/min
SDS: 6
SRS: 0
SSS: 6
TID: 0.87

## 2019-12-10 LAB — ECHOCARDIOGRAM COMPLETE
Height: 62 in
Weight: 2352 oz

## 2019-12-10 MED ORDER — TECHNETIUM TC 99M TETROFOSMIN IV KIT
10.1000 | PACK | Freq: Once | INTRAVENOUS | Status: AC | PRN
  Administered 2019-12-10: 10.1 via INTRAVENOUS
  Filled 2019-12-10: qty 11

## 2019-12-10 MED ORDER — TECHNETIUM TC 99M TETROFOSMIN IV KIT
30.6000 | PACK | Freq: Once | INTRAVENOUS | Status: AC | PRN
  Administered 2019-12-10: 30.6 via INTRAVENOUS
  Filled 2019-12-10: qty 31

## 2019-12-10 MED ORDER — AMINOPHYLLINE 25 MG/ML IV SOLN
150.0000 mg | Freq: Once | INTRAVENOUS | Status: AC
Start: 2019-12-10 — End: 2019-12-10
  Administered 2019-12-10: 150 mg via INTRAVENOUS

## 2019-12-10 MED ORDER — REGADENOSON 0.4 MG/5ML IV SOLN
0.4000 mg | Freq: Once | INTRAVENOUS | Status: AC
  Administered 2019-12-10: 0.4 mg via INTRAVENOUS

## 2019-12-21 ENCOUNTER — Telehealth: Payer: Self-pay | Admitting: *Deleted

## 2019-12-21 DIAGNOSIS — Z01812 Encounter for preprocedural laboratory examination: Secondary | ICD-10-CM

## 2019-12-21 DIAGNOSIS — I712 Thoracic aortic aneurysm, without rupture, unspecified: Secondary | ICD-10-CM

## 2019-12-21 DIAGNOSIS — I7789 Other specified disorders of arteries and arterioles: Secondary | ICD-10-CM

## 2019-12-21 NOTE — Telephone Encounter (Signed)
Normal left ventricular pump function.  Mild to moderate mitral valve regurgitation.  Mild aortic valve regurgitation.   Ascending aorta measures 45 mm.  Please order a CT angiogram of aorta to measure.   Candee Furbish, MD   Last BUN 20/Crea 0.87 11/16/2019   Pt aware she will be called to be scheduled for this testing.

## 2019-12-27 ENCOUNTER — Other Ambulatory Visit: Payer: Self-pay

## 2019-12-27 ENCOUNTER — Other Ambulatory Visit: Payer: Medicare HMO | Admitting: *Deleted

## 2019-12-27 DIAGNOSIS — Z01812 Encounter for preprocedural laboratory examination: Secondary | ICD-10-CM

## 2019-12-27 DIAGNOSIS — I7789 Other specified disorders of arteries and arterioles: Secondary | ICD-10-CM | POA: Diagnosis not present

## 2019-12-27 LAB — BASIC METABOLIC PANEL
BUN/Creatinine Ratio: 14 (ref 12–28)
BUN: 12 mg/dL (ref 8–27)
CO2: 26 mmol/L (ref 20–29)
Calcium: 9.8 mg/dL (ref 8.7–10.3)
Chloride: 100 mmol/L (ref 96–106)
Creatinine, Ser: 0.85 mg/dL (ref 0.57–1.00)
GFR calc Af Amer: 72 mL/min/{1.73_m2} (ref >59)
GFR calc non Af Amer: 63 mL/min/{1.73_m2} (ref >59)
Glucose: 129 mg/dL — ABNORMAL HIGH (ref 65–99)
Potassium: 3.8 mmol/L (ref 3.5–5.2)
Sodium: 139 mmol/L (ref 134–144)

## 2019-12-30 ENCOUNTER — Other Ambulatory Visit: Payer: Self-pay

## 2019-12-30 ENCOUNTER — Ambulatory Visit (INDEPENDENT_AMBULATORY_CARE_PROVIDER_SITE_OTHER)
Admission: RE | Admit: 2019-12-30 | Discharge: 2019-12-30 | Disposition: A | Discharge status: Home / Self Care | Payer: Medicare HMO | Source: Ambulatory Visit | Attending: Cardiology | Admitting: Cardiology

## 2019-12-30 DIAGNOSIS — I7789 Other specified disorders of arteries and arterioles: Secondary | ICD-10-CM

## 2019-12-30 DIAGNOSIS — I270 Primary pulmonary hypertension: Secondary | ICD-10-CM | POA: Diagnosis not present

## 2019-12-30 DIAGNOSIS — I712 Thoracic aortic aneurysm, without rupture: Secondary | ICD-10-CM | POA: Diagnosis not present

## 2019-12-30 DIAGNOSIS — I119 Hypertensive heart disease without heart failure: Secondary | ICD-10-CM | POA: Diagnosis not present

## 2019-12-30 DIAGNOSIS — I700 Atherosclerosis of aorta: Secondary | ICD-10-CM | POA: Diagnosis not present

## 2019-12-30 MED ORDER — IOHEXOL 350 MG/ML SOLN
100.0000 mL | Freq: Once | INTRAVENOUS | Status: AC | PRN
  Administered 2019-12-30: 100 mL via INTRAVENOUS

## 2020-01-04 ENCOUNTER — Telehealth: Payer: Self-pay | Admitting: Family Medicine

## 2020-01-04 DIAGNOSIS — E041 Nontoxic single thyroid nodule: Secondary | ICD-10-CM | POA: Insufficient documentation

## 2020-01-04 DIAGNOSIS — E049 Nontoxic goiter, unspecified: Secondary | ICD-10-CM

## 2020-01-04 NOTE — Telephone Encounter (Signed)
Ref done Will route to pcc  

## 2020-01-04 NOTE — Telephone Encounter (Signed)
-----   Message from [name], [place] sent at 01/04/2020 12:49 PM EDT ----- Pt advised that Dr. Marlou Porch office will call and go over full CT with her but he had advised Dr. Glori Bickers about enlarged thyroid. Pt agrees with getting an [place] of thyroid. She would like to have it done in [place], please put referral in and I advise pt our [facility] will call to schedule appt

## 2020-01-11 ENCOUNTER — Ambulatory Visit
Admission: RE | Admit: 2020-01-11 | Discharge: 2020-01-11 | Disposition: A | Discharge status: Home / Self Care | Payer: Medicare HMO | Source: Ambulatory Visit | Attending: Family Medicine | Admitting: Family Medicine

## 2020-01-11 DIAGNOSIS — E049 Nontoxic goiter, unspecified: Secondary | ICD-10-CM

## 2020-01-11 DIAGNOSIS — E042 Nontoxic multinodular goiter: Secondary | ICD-10-CM | POA: Diagnosis not present

## 2020-01-12 ENCOUNTER — Ambulatory Visit: Payer: Medicare HMO | Admitting: Orthopaedic Surgery

## 2020-01-12 ENCOUNTER — Telehealth: Payer: Self-pay | Admitting: Family Medicine

## 2020-01-12 DIAGNOSIS — E041 Nontoxic single thyroid nodule: Secondary | ICD-10-CM

## 2020-01-12 NOTE — Telephone Encounter (Signed)
-----   Message from [name], [place] sent at 01/12/2020 11:28 AM EDT ----- Pt notified of [place] results and Dr. Marliss Coots comments. Pt agrees with referral to Endo, she would like to see someone in [place] if possible, I advised her our [facility] will call to schedule appt

## 2020-01-12 NOTE — Telephone Encounter (Signed)
Referral done Sent to [facility]

## 2020-01-13 NOTE — Telephone Encounter (Signed)
Noted.   Referral is being reviewed by Endo

## 2020-01-18 ENCOUNTER — Encounter: Payer: Self-pay | Admitting: Family Medicine

## 2020-01-18 ENCOUNTER — Ambulatory Visit (INDEPENDENT_AMBULATORY_CARE_PROVIDER_SITE_OTHER)
Admission: RE | Admit: 2020-01-18 | Discharge: 2020-01-18 | Disposition: A | Discharge status: Home / Self Care | Payer: Medicare HMO | Source: Ambulatory Visit | Attending: Family Medicine | Admitting: Family Medicine

## 2020-01-18 ENCOUNTER — Ambulatory Visit (INDEPENDENT_AMBULATORY_CARE_PROVIDER_SITE_OTHER): Payer: Medicare HMO | Admitting: Family Medicine

## 2020-01-18 ENCOUNTER — Other Ambulatory Visit: Payer: Self-pay

## 2020-01-18 ENCOUNTER — Ambulatory Visit: Payer: Medicare HMO | Admitting: Family Medicine

## 2020-01-18 VITALS — BP 126/72 | HR 58 | Temp 97.6°F | Ht 62.0 in | Wt 147.4 lb

## 2020-01-18 DIAGNOSIS — M25512 Pain in left shoulder: Secondary | ICD-10-CM | POA: Diagnosis not present

## 2020-01-18 DIAGNOSIS — M79621 Pain in right upper arm: Secondary | ICD-10-CM | POA: Diagnosis not present

## 2020-01-18 DIAGNOSIS — M542 Cervicalgia: Secondary | ICD-10-CM | POA: Diagnosis not present

## 2020-01-18 DIAGNOSIS — M79622 Pain in left upper arm: Secondary | ICD-10-CM

## 2020-01-18 DIAGNOSIS — M47812 Spondylosis without myelopathy or radiculopathy, cervical region: Secondary | ICD-10-CM | POA: Diagnosis not present

## 2020-01-18 DIAGNOSIS — G8929 Other chronic pain: Secondary | ICD-10-CM | POA: Diagnosis not present

## 2020-01-18 DIAGNOSIS — M19012 Primary osteoarthritis, left shoulder: Secondary | ICD-10-CM | POA: Diagnosis not present

## 2020-01-18 NOTE — Assessment & Plan Note (Signed)
With h/o degenerative spondylosis in the past (rev xr from 08) ? If radiculopathy is adding to bilateral arm symptoms/pain  CS film today -plan to follow rad rev Pt has tramadol for severe pain prn (with caution)  Adv also to try voltaren gel otc  Heat /ice if helpful

## 2020-01-18 NOTE — Assessment & Plan Note (Signed)
In setting of OA and deg cervical change multifactorial Some impingement signs in L shoulder more than R Pt mentions she hurts all over more as time goes on - arimidex could worsen this  Pending films and trial of voltaren gel otc  Consider sed rate/crp if no imp (I think PMR is unlikely but would want to rule it out)

## 2020-01-18 NOTE — Patient Instructions (Signed)
Take tylenol as needed  Tramadol for severe pain with caution  Get over the counter diclofenac (voltaren) gel- you can rub this in to affected areas as needed as well   Xray of neck and shoulder today  Then we will make a plan

## 2020-01-18 NOTE — Assessment & Plan Note (Signed)
Actually both sides but more pronounced on L  Also upper arm pain  ? If cervical radiculopathy could add to symptoms   Today -some impingement signs on L shoulder exam  Xray done Known OA in other areas  Disc passive rom exercises and given handout Ice/heat and avoid overhead work for now  Urged to try voltaren gel otc Has tramadol to try with caution if pain becomes severe

## 2020-01-18 NOTE — Progress Notes (Signed)
Subjective:    Patient ID: Sara [name], female    DOB: 1931-07-07, 86 y.o.   MRN: [id number]  This visit occurred during the SARS-CoV-2 public health emergency.  Safety protocols were in place, including screening questions prior to the visit, additional usage of staff PPE, and extensive cleaning of exam room while observing appropriate contact time as indicated for disinfecting solutions.    HPI Pt presents with c/o arm pain (worse in L arm)   Wt Readings from Last 3 Encounters:  01/18/20 147 lb 6 oz (66.8 kg)  12/10/19 147 lb (66.7 kg)  11/22/19 147 lb 6 oz (66.8 kg)   26.96 kg/m  Started after some yard work  Health and safety inspector a [place] The next day-shoulder area and upper arm started to hurt  Then other arm started as well   Neck hurts some also   Taking arimidex - ? If that adds to the pain   Is covid immunized    Fingers feel al little numb at times as well  More in L hand  She is R handed   Notable-pt has arthritis in hands Last R hand xray CLINICAL DATA:  Pain at the base of the thumb  EXAM: RIGHT HAND - COMPLETE 3+ VIEW  COMPARISON:  None.  FINDINGS: No fracture or malalignment. Advanced arthritis at the first [facility] joint. Joint space narrowing at the D IP and PIP joints.  IMPRESSION: Arthritis of the digits, including advanced arthritis at the first [facility] joint.   Electronically Signed   By: Donavan Foil M.D.   On: 10/18/2019 22:27  She had a shot from Dr Patria Mane helped one  Cervical dz as well    Clinical Data: Neck and right arm pain.  CERVICAL SPINE - 5 VIEWS:  Findings: Alignment is normal. There is mild disk space narrowing at C5-6 with small posteriorly projecting osteophytes. There is mild osteophytic encroachment upon the foramen on the right at that level. The foramina on the left appear widely patent. There are anterior bridging osteophytes that could indicate diffuse idiopathic skeletal hyperostosis. No evidence of  significant facet arthropathy.  IMPRESSION:   1. Mild degenerative spondylosis at C5-6 with mild osteophytic encroachment upon the foramen on the right.  2. Anterior osteophytes consistent with DISH.    Provider: Pearla Dubonnet   vical spine film from 08  Takes tylenol occ tramadol /not much  Walks for exercise- some   Patient Active Problem List   Diagnosis Date Noted  . Neck pain 01/18/2020  . Left shoulder pain 01/18/2020  . Pain in both upper arms 01/18/2020  . Thyroid nodule 01/04/2020  . Flank pain 11/22/2019  . Hand pain, right 10/18/2019  . Family history of breast cancer   . Family history of prostate cancer   . Family history of lung cancer   . Carcinoma of upper-outer quadrant of right breast in female, estrogen receptor positive ([place]) 02/23/2019  . Pre-operative clearance 02/21/2019  . History of breast cancer 02/21/2019  . Medicare annual wellness visit, subsequent 01/05/2019  . Encounter for screening mammogram for breast cancer 01/05/2019  . Knee pain, left 10/14/2018  . Arthritis of left hip 07/29/2018  . Bradycardia 07/01/2018  . Dyspnea 07/01/2018  . Rhinorrhea 01/05/2018  . Chronic diastolic heart failure ([place]) 12/27/2017  . Lumbar spinal stenosis 03/11/2017  . Diastolic dysfunction 28/41/3244  . Estrogen deficiency 12/16/2016  . Routine general medical examination at a health care facility 12/16/2016  . CAD (coronary artery disease) 12/04/2016  .  Dyspepsia 03/15/2016  . 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 Eczema 03/06/2015  . Prediabetes 02/11/2014  . Intertrigo labialis 12/07/2012  . CONSTIPATION, CHRONIC 07/03/2010  . Osteoporosis 06/24/2008  . HYPERCHOLESTEROLEMIA 12/08/2006  . Essential hypertension 12/08/2006  . GERD 12/08/2006  . H/O herpes zoster 12/08/2006   Past Medical History:  Diagnosis Date  . Allergy   . CAD (coronary artery disease)   . Chronic diastolic (congestive) heart failure ([place])   . Clinical trial participant    [facility] studies in familial dementia  . Dyspnea 11/28/2016  . Family history of breast cancer   . Family history of lung cancer   . Family history of prostate cancer   . Frequent UTI   . GERD (gastroesophageal reflux disease)   . Hypertension   . Osteoarthritis    hips, back  . Osteopenia   . Pneumonia   . Spinal stenosis   . Stroke [facility]) 1985   Past Surgical History:  Procedure Laterality Date  . ABDOMINAL HYSTERECTOMY     partial has one ovary  . APPENDECTOMY    . BREAST LUMPECTOMY WITH RADIOACTIVE SEED LOCALIZATION Right 04/07/2019   Procedure: RIGHT BREAST LUMPECTOMY WITH RADIOACTIVE SEED LOCALIZATION;  Surgeon: Jovita Kussmaul, MD;  Location: [place];  Service: General;  Laterality: Right;  . CATARACT EXTRACTION Bilateral   . LEFT HEART CATH AND CORONARY ANGIOGRAPHY N/A 11/29/2016   Procedure: Left Heart Cath and Coronary Angiography;  Surgeon: Sherren Mocha, MD;  Location: [place] [facility];  Service: Cardiovascular;  Laterality: N/A;  . LUMBAR DISC SURGERY     x 3  . rotator cuff surgery Right 2010   Social History   Tobacco Use  . Smoking status: Never Smoker  . Smokeless tobacco: Never Used  Vaping Use  . Vaping Use: Never used  Substance Use Topics  . Alcohol use: No    Alcohol/week: 0.0 standard drinks  . Drug use: No   Family History  Problem Relation Age of Onset  . Lung cancer Brother 32  . Other Mother        kidney tumor  . Hypertension Mother   . Diabetes Sister   . Diabetes Brother   . Diabetes Sister        x 2  . Colon cancer Maternal Uncle   . Other Sister        intestine burst  . Breast cancer Maternal Aunt        x 2  . Breast cancer Daughter        unknown cancer  . Diabetes Son        x 3  . Diabetes Daughter   . Breast cancer Sister        x2, diagnosed in 85s and 68s  . Prostate cancer Maternal Uncle   . Esophageal cancer Neg Hx   . Rectal cancer Neg Hx   . Stomach cancer Neg Hx    Allergies  Allergen Reactions  .  Amoxicillin     Reaction not known  . Atorvastatin     aching  . Cefpodoxime Proxetil     Reaction not known  . Clarithromycin     Reaction not known  . Fosamax [Alendronate Sodium]     Body pain   . Furosemide     REACTION: doesn't help  . Gabapentin Swelling    Caused severe swellling   Current Outpatient Medications on File Prior to Visit  Medication Sig Dispense Refill  .  acetaminophen (TYLENOL) 500 MG tablet Take 1,000 mg by mouth every 6 (six) hours as needed.    Marland Kitchen anastrozole (ARIMIDEX) 1 MG tablet Take 1 tablet (1 mg total) by mouth daily. 90 tablet 3  . aspirin EC 81 MG tablet Take 81 mg by mouth daily.      . Cholecalciferol (VITAMIN D-3) 1000 UNITS CAPS Take 1,000 Units by mouth daily.     Marland Kitchen diltiazem (CARDIZEM CD) 120 MG 24 hr capsule Take 1 capsule (120 mg total) by mouth daily. 90 capsule 1  . Docusate Sodium (EQ STOOL SOFTENER PO) Take 100 mg by mouth daily as needed (constipation).     Marland Kitchen doxylamine, Sleep, (EQ SLEEP AID) 25 MG tablet Take 25 mg by mouth at bedtime as needed for sleep.     . hydrochlorothiazide (HYDRODIURIL) 25 MG tablet Take 1 tablet (25 mg total) by mouth daily. 90 tablet 1  . nitroGLYCERIN (NITROSTAT) 0.4 MG SL tablet Place 1 tablet (0.4 mg total) under the tongue every 5 (five) minutes as needed for chest pain (max 3 doses in 15 minutes). 25 tablet 3  . polyethylene glycol (MIRALAX / GLYCOLAX) packet Take 17 g by mouth daily.     . potassium chloride (KLOR-CON) 10 MEQ tablet Take 1 tablet (10 mEq total) by mouth daily. 90 tablet 1  . traMADol (ULTRAM) 50 MG tablet Take 1 tablet (50 mg total) by mouth every 6 (six) hours as needed. (Patient taking differently: Take 50 mg by mouth every 6 (six) hours as needed for moderate pain. ) 30 tablet 0  . vitamin B-12 (CYANOCOBALAMIN) 1000 MCG tablet Take 1,000 mcg by mouth daily.     No current facility-administered medications on file prior to visit.    Review of Systems  Constitutional: Positive for  fatigue. Negative for activity change, appetite change, fever and unexpected weight change.  HENT: Negative for congestion, ear pain, rhinorrhea, sinus pressure and sore throat.   Eyes: Negative for pain, redness and visual disturbance.  Respiratory: Negative for cough, shortness of breath and wheezing.   Cardiovascular: Negative for chest pain and palpitations.  Gastrointestinal: Negative for abdominal pain, blood in stool, constipation and diarrhea.  Endocrine: Negative for polydipsia and polyuria.  Genitourinary: Negative for dysuria, frequency and urgency.  Musculoskeletal: Positive for arthralgias, back pain, myalgias and neck pain. Negative for joint swelling.  Skin: Negative for pallor and rash.  Allergic/Immunologic: Negative for environmental allergies.  Neurological: Negative for dizziness, syncope, facial asymmetry and headaches.  Hematological: Negative for adenopathy. Does not bruise/bleed easily.  Psychiatric/Behavioral: Negative for decreased concentration and dysphoric mood. The patient is not nervous/anxious.        Objective:   Physical Exam Constitutional:      General: She is not in acute distress.    Appearance: Normal appearance. She is normal weight. She is not ill-appearing or diaphoretic.  HENT:     Head: Normocephalic and atraumatic.     Mouth/Throat:     Mouth: Mucous membranes are moist.     Pharynx: Oropharynx is clear.  Eyes:     General: No scleral icterus.       Right eye: No discharge.        Left eye: No discharge.     Conjunctiva/sclera: Conjunctivae normal.     Pupils: Pupils are equal, round, and reactive to light.  Cardiovascular:     Rate and Rhythm: Regular rhythm. Bradycardia present.     Pulses: Normal pulses.     Heart sounds:  Normal heart sounds.  Pulmonary:     Effort: Pulmonary effort is normal. No respiratory distress.     Breath sounds: Normal breath sounds. No wheezing or rales.  Musculoskeletal:     Right shoulder: Tenderness  present. No swelling, deformity or crepitus. Decreased range of motion. Normal strength. Normal pulse.     Left shoulder: Tenderness and bony tenderness present. No swelling, deformity or crepitus. Decreased range of motion. Normal strength. Normal pulse.     Right upper arm: No swelling, edema, deformity or bony tenderness.     Left upper arm: No swelling, edema, deformity or bony tenderness.     Cervical back: Tenderness, bony tenderness and crepitus present. No deformity, erythema, signs of trauma, rigidity or torticollis. Pain with movement present. Normal range of motion.     Comments: L shoulder-positive hawking and neer tests and limited abduction as well as int/ext rotation  Some acromion tenderness R shoulder - less tenderness/ better abduction (overall better than the L)   CS- tender over lower cervical spinous processes  Pain to extend fully and rotate to R slt trapezius tenderness     Lymphadenopathy:     Cervical: No cervical adenopathy.  Skin:    General: Skin is warm and dry.     Coloration: Skin is not pale.     Findings: No erythema or rash.  Neurological:     Mental Status: She is alert.     Cranial Nerves: No cranial nerve deficit.     Sensory: No sensory deficit.     Motor: No weakness.     Deep Tendon Reflexes: Reflexes normal.  Psychiatric:        Mood and Affect: Mood normal.           Assessment & Plan:   Problem List Items Addressed This Visit      Other   Neck pain    With h/o degenerative spondylosis in the past (rev xr from 08) ? If radiculopathy is adding to bilateral arm symptoms/pain  CS film today -plan to follow rad rev Pt has tramadol for severe pain prn (with caution)  Adv also to try voltaren gel otc  Heat /ice if helpful        Relevant Orders   DG Cervical Spine Complete   Left shoulder pain - Primary    Actually both sides but more pronounced on L  Also upper arm pain  ? If cervical radiculopathy could add to symptoms    Today -some impingement signs on L shoulder exam  Xray done Known OA in other areas  Disc passive rom exercises and given handout Ice/heat and avoid overhead work for now  [employer] to try voltaren gel otc Has tramadol to try with caution if pain becomes severe       Relevant Orders   DG Shoulder Left   Pain in both upper arms    In setting of OA and deg cervical change multifactorial Some impingement signs in L shoulder more than R Pt mentions she hurts all over more as time goes on - arimidex could worsen this  Pending films and trial of voltaren gel otc  Consider sed rate/crp if no imp (I think PMR is unlikely but would want to rule it out)

## 2020-01-19 ENCOUNTER — Telehealth: Payer: Self-pay | Admitting: Family Medicine

## 2020-01-19 DIAGNOSIS — M542 Cervicalgia: Secondary | ICD-10-CM

## 2020-01-19 DIAGNOSIS — M25512 Pain in left shoulder: Secondary | ICD-10-CM

## 2020-01-19 DIAGNOSIS — G8929 Other chronic pain: Secondary | ICD-10-CM

## 2020-01-19 NOTE — Telephone Encounter (Signed)
-----   Message from Sara [name], [place] sent at 01/19/2020 12:25 PM EDT ----- Pt notified of xray results and Dr. Marliss Coots comments. Pt said she would like to see Dr. Rush Farmer in [place]. I advise pt our [facility] will call to schedule appt

## 2020-01-25 ENCOUNTER — Ambulatory Visit: Payer: Medicare HMO | Admitting: Orthopaedic Surgery

## 2020-01-26 ENCOUNTER — Ambulatory Visit: Payer: Medicare HMO | Admitting: Orthopaedic Surgery

## 2020-02-01 ENCOUNTER — Ambulatory Visit (INDEPENDENT_AMBULATORY_CARE_PROVIDER_SITE_OTHER): Payer: Medicare HMO | Admitting: Internal Medicine

## 2020-02-01 ENCOUNTER — Other Ambulatory Visit: Payer: Self-pay

## 2020-02-01 ENCOUNTER — Encounter: Payer: Self-pay | Admitting: Internal Medicine

## 2020-02-01 VITALS — BP 142/82 | HR 59 | Ht 62.0 in | Wt 148.4 lb

## 2020-02-01 DIAGNOSIS — E041 Nontoxic single thyroid nodule: Secondary | ICD-10-CM

## 2020-02-01 LAB — TSH: TSH: 1.34 mIU/L (ref 0.40–4.50)

## 2020-02-01 LAB — T4, FREE: Free T4: 1.1 ng/dL (ref 0.8–1.8)

## 2020-02-01 NOTE — Progress Notes (Signed)
Name: Sara [name]  MRN/ DOB: [id number], 09/02/1931    Age/ Sex: 86 y.o., female    PCP: Tower, Wynelle Fanny, MD   Reason for Endocrinology Evaluation: Thyroid nodule      Date of Initial Endocrinology Evaluation: 02/01/2020     HPI: Ms. Sara [name] is a 86 y.o. female with a past medical history of HTN, CHF and Hx of breast ca. The patient presented for initial endocrinology clinic visit on 02/01/2020 for consultative assistance with her Thyroid nodule    Patient was noted to have an incidental finding of an asymmetrical thyroid gland on CT Angio in 12/2019 during evaluation of thoracic aneurysm. This prompted a thyroid ultrasound demonstrating 4.3 cm right thyroid nodule meeting criteria for FNA.   She is S/P right lumpectomy , chemo and radiation in 2020 secondary to breast Ca.  Denies local neck symptoms  But endorses burping. Denies dysphagia.   Weight has been steady  Denies palpitations Denies diarrhea   Daughter thyroid disease.      HISTORY:  Past Medical History:  Past Medical History:  Diagnosis Date   Allergy    CAD (coronary artery disease)    Chronic diastolic (congestive) heart failure (HCC)    Clinical trial participant    [facility] Genetic studies in familial dementia   Dyspnea 11/28/2016   Family history of breast cancer    Family history of lung cancer    Family history of prostate cancer    Frequent UTI    GERD (gastroesophageal reflux disease)    Hypertension    Osteoarthritis    hips, back   Osteopenia    Pneumonia    Spinal stenosis    Stroke ([place]) 1985   Past Surgical History:  Past Surgical History:  Procedure Laterality Date   ABDOMINAL HYSTERECTOMY     partial has one ovary   APPENDECTOMY     BREAST LUMPECTOMY WITH RADIOACTIVE SEED LOCALIZATION Right 04/07/2019   Procedure: RIGHT BREAST LUMPECTOMY WITH RADIOACTIVE SEED LOCALIZATION;  Surgeon: Jovita Kussmaul, MD;  Location: [place];  Service:  General;  Laterality: Right;   CATARACT EXTRACTION Bilateral    LEFT HEART CATH AND CORONARY ANGIOGRAPHY N/A 11/29/2016   Procedure: Left Heart Cath and Coronary Angiography;  Surgeon: Sherren Mocha, MD;  Location: [place] [facility];  Service: Cardiovascular;  Laterality: N/A;   LUMBAR DISC SURGERY     x 3   rotator cuff surgery Right 2010    Social History:  reports that she has never smoked. She has never used smokeless tobacco. She reports that she does not drink alcohol and does not use drugs. Family History: family history includes Breast cancer in her daughter, maternal aunt, and sister; Colon cancer in her maternal uncle; Diabetes in her brother, daughter, sister, sister, and son; Hypertension in her mother; Lung cancer (age of onset: 67) in her brother; Other in her mother and sister; Prostate cancer in her maternal uncle.   HOME MEDICATIONS: Allergies as of 02/01/2020      Reactions   Amoxicillin    Reaction not known   Atorvastatin    aching   Cefpodoxime Proxetil    Reaction not known   Clarithromycin    Reaction not known   Fosamax [alendronate Sodium]    Body pain    Furosemide    REACTION: doesn't help   Gabapentin Swelling   Caused severe swellling      Medication List  Accurate as of February 01, 2020  2:29 PM. If you have any questions, ask your nurse or doctor.        acetaminophen 500 MG tablet Commonly known as: TYLENOL Take 1,000 mg by mouth every 6 (six) hours as needed.   anastrozole 1 MG tablet Commonly known as: ARIMIDEX Take 1 tablet (1 mg total) by mouth daily.   aspirin EC 81 MG tablet Take 81 mg by mouth daily.   diltiazem 120 MG 24 hr capsule Commonly known as: CARDIZEM CD Take 1 capsule (120 mg total) by mouth daily.   EQ Sleep Aid 25 MG tablet Generic drug: doxylamine (Sleep) Take 25 mg by mouth at bedtime as needed for sleep.   EQ STOOL SOFTENER PO Take 100 mg by mouth daily as needed (constipation).    hydrochlorothiazide 25 MG tablet Commonly known as: HYDRODIURIL Take 1 tablet (25 mg total) by mouth daily.   nitroGLYCERIN 0.4 MG SL tablet Commonly known as: NITROSTAT Place 1 tablet (0.4 mg total) under the tongue every 5 (five) minutes as needed for chest pain (max 3 doses in 15 minutes).   polyethylene glycol 17 g packet Commonly known as: MIRALAX / GLYCOLAX Take 17 g by mouth daily.   potassium chloride 10 MEQ tablet Commonly known as: KLOR-CON Take 1 tablet (10 mEq total) by mouth daily.   traMADol 50 MG tablet Commonly known as: ULTRAM Take 1 tablet (50 mg total) by mouth every 6 (six) hours as needed. What changed: reasons to take this   vitamin B-12 1000 MCG tablet Commonly known as: CYANOCOBALAMIN Take 1,000 mcg by mouth daily.   Vitamin D-3 25 MCG (1000 UT) Caps Take 1,000 Units by mouth daily.         REVIEW OF SYSTEMS: A comprehensive ROS was conducted with the patient and is negative except as per HPI and below:  ROS     OBJECTIVE:  VS: BP (!) 142/82 (BP Location: Left Arm, Patient Position: Sitting, Cuff Size: Normal)    Pulse (!) 59    Ht 5\' 2"  (1.575 m)    Wt 148 lb 6.4 oz (67.3 kg)    SpO2 96%    BMI 27.14 kg/m    Wt Readings from Last 3 Encounters:  02/01/20 148 lb 6.4 oz (67.3 kg)  01/18/20 147 lb 6 oz (66.8 kg)  12/10/19 147 lb (66.7 kg)     EXAM: General: Pt appears well and is in NAD  Neck: General: Supple without adenopathy. Thyroid: Unable to palpate right nodule   Lungs: Clear with good BS bilat with no rales, rhonchi, or wheezes  Heart: Auscultation: RRR.  Abdomen: Normoactive bowel sounds, soft, nontender, without masses or organomegaly palpable  Extremities:  BL LE: No pretibial edema normal ROM and strength.  Skin: Hair: Texture and amount normal with gender appropriate distribution Skin Inspection: No rashes Skin Palpation: Skin temperature, texture, and thickness normal to palpation  Neuro: Cranial nerves: II - XII  grossly intact  Motor: Normal strength throughout DTRs: 2+ and symmetric in UE without delay in relaxation phase  Mental Status: Judgment, insight: Intact Orientation: Oriented to time, place, and person Mood and affect: No depression, anxiety, or agitation     DATA REVIEWED: Results for TENA, LINEBAUGH (MRN [id number]) as of 02/02/2020 12:27  Ref. Range 02/01/2020 14:43  TSH Latest Ref Range: 0.40 - 4.50 mIU/L 1.34  T4,Free(Direct) Latest Ref Range: 0.8 - 1.8 ng/dL 1.1     Thyroid ULtrasound 01/11/2020  Nodule #  1:  Location: Right; Mid  Maximum size: 4.3 cm; Other 2 dimensions: 3.1 x 2.0 cm  Composition: solid/almost completely solid (2)  Echogenicity: isoechoic (1)  Shape: not taller-than-wide (0)  Margins: smooth (0)  Echogenic foci: none (0)  ACR TI-RADS total points: 3.  ACR TI-RADS risk category: TR3 (3 points).  ACR TI-RADS recommendations:  **Given size (>/= 2.5 cm) and appearance, fine needle aspiration of this mildly suspicious nodule should be considered based on TI-RADS criteria.  _________________________________________________________  IMPRESSION: Solitary 4.3 cm TI-RADS category 3 (mildly suspicious) nodule occupying the right mid and inferior thyroid gland. This lesion meets criteria to consider fine-needle aspiration biopsy.  The above is in keeping with the ACR TI-RADS recommendations - J Am Coll Radiol 2017;14:587-595.  ASSESSMENT/PLAN/RECOMMENDATIONS:   Right thyroid nodule :  - Pt is clinically and biochemically euthyroid  - No local neck symptoms  - Pt agreed to proceed with FNA of the right thyroid nodule.      F/U in 1 yr or sooner  Pending FNA results   Signed electronically by: Mack Guise, MD  [facility] Endocrinology  [facility] [address] [place], [place] [address] Phone: [phone] FAX: [phone]   CC: Tower, Wynelle Fanny, MD [place] [address] Phone: [phone] Fax: [phone]   Return to Endocrinology clinic as below: Future Appointments  Date Time Provider [place]  02/02/2020  3:15 PM Mcarthur Rossetti, MD [facility] None

## 2020-02-02 ENCOUNTER — Ambulatory Visit: Payer: Medicare HMO | Admitting: Orthopaedic Surgery

## 2020-02-02 ENCOUNTER — Encounter: Payer: Self-pay | Admitting: Internal Medicine

## 2020-02-02 ENCOUNTER — Encounter: Payer: Self-pay | Admitting: Orthopaedic Surgery

## 2020-02-02 DIAGNOSIS — M542 Cervicalgia: Secondary | ICD-10-CM

## 2020-02-02 DIAGNOSIS — G8929 Other chronic pain: Secondary | ICD-10-CM

## 2020-02-02 DIAGNOSIS — M25512 Pain in left shoulder: Secondary | ICD-10-CM

## 2020-02-02 MED ORDER — METHYLPREDNISOLONE 4 MG PO TABS
ORAL_TABLET | ORAL | 0 refills | Status: DC
Start: 2020-02-02 — End: 2020-03-22

## 2020-02-02 NOTE — Progress Notes (Signed)
Office Visit Note   Patient: Sara [name]           Date of Birth: September 18, 1931           MRN: [id number] Visit Date: 02/02/2020              Requested by: Tower, Wynelle Fanny, MD [place],  [place] [address] PCP: Abner Greenspan, MD   Assessment & Plan: Visit Diagnoses:  1. Cervicalgia   2. Chronic left shoulder pain     Plan: To treat the acute pain in her neck and shoulder I did recommend a steroid taper.  However, there is not really other medications that I recommend for her.  My neck step would be formal physical therapy for her neck and her shoulders if things worsen in any way.  Right now she wants to just try the steroid and will let us know if things worsen.  All questions and concerns were answered and addressed.  Follow-up is as needed.  Follow-Up Instructions: Return if symptoms worsen or fail to improve.   Orders:  No orders of the defined types were placed in this encounter.  Meds ordered this encounter  Medications  . methylPREDNISolone (MEDROL) 4 MG tablet    Sig: Medrol dose pack. Take as instructed    Dispense:  21 tablet    Refill:  0      Procedures: No procedures performed   Clinical Data: No additional findings.   Subjective: Chief Complaint  Patient presents with  . Left Shoulder - Pain  . Neck - Pain  The patient is a very pleasant 86 year old female that I seen before.  She is a patient of Dr. Rica Mote as well.  She has x-rays of her left shoulder and cervical spine on the canopy system for me to review.  She does report a lot of neck pain and popping as well as bilateral shoulder pain.  She says the left shoulder hurts more in her axilla region and she does report injuring that shoulder when she was doing something with a rose bush earlier this year.  She does report some burning pain occasionally in her legs but denies any numbness and tingling in her hands.  She has tried Neurontin in the past but she is not aware  of that but I did see on her medical chart that she has severe swelling with gabapentin.  She is not a diabetic.  HPI  Review of Systems Today she denies any headache, chest pain, shortness of breath, fever, chills, nausea, vomiting  Objective: Vital Signs: There were no vitals taken for this visit.  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  The shoulder is clinically well located it does show some grinding at the [facility] joint but no significant loss of motion but there is some pain.  The right shoulder also moves better but does have some pain throughout the arc of motion.  Both shoulder show signs of impingement.  Her neck has limited flexion extension as well as rotation with paraspinal muscular pain. Specialty Comments:  No specialty comments available.  Imaging: No results found. X-rays of her cervical spine show significant arthritic changes at multiple levels throughout the cervical spine with a loss of her cervical lordosis due to the arthritic changes.  X-rays of the left shoulder show  some mild arthritic changes of the AC joint.  The glenohumeral joint is well located in the shoulder is not high riding.  PMFS History: Patient Active Problem List   Diagnosis Date Noted  . Neck pain 01/18/2020  . Left shoulder pain 01/18/2020  . Pain in both upper arms 01/18/2020  . Thyroid nodule 01/04/2020  . Flank pain 11/22/2019  . Hand pain, right 10/18/2019  . Family history of breast cancer   . Family history of prostate cancer   . Family history of lung cancer   . Carcinoma of upper-outer quadrant of right breast in female, estrogen receptor positive ([place]) 02/23/2019  . Pre-operative clearance 02/21/2019  . History of breast cancer 02/21/2019  . Medicare annual wellness visit, subsequent 01/05/2019  . Encounter for screening mammogram for  breast cancer 01/05/2019  . Knee pain, left 10/14/2018  . Arthritis of left hip 07/29/2018  . Bradycardia 07/01/2018  . Dyspnea 07/01/2018  . Rhinorrhea 01/05/2018  . Chronic diastolic heart failure ([place]) 12/27/2017  . Lumbar spinal stenosis 03/11/2017  . Diastolic dysfunction 97/67/3419  . Estrogen deficiency 12/16/2016  . Routine general medical examination at a health care facility 12/16/2016  . CAD (coronary artery disease) 12/04/2016  . Dyspepsia 03/15/2016  . 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 Eczema 03/06/2015  . Prediabetes 02/11/2014  . Intertrigo labialis 12/07/2012  . CONSTIPATION, CHRONIC 07/03/2010  . Osteoporosis 06/24/2008  . HYPERCHOLESTEROLEMIA 12/08/2006  . Essential hypertension 12/08/2006  . GERD 12/08/2006  . H/O herpes zoster 12/08/2006   Past Medical History:  Diagnosis Date  . Allergy   . CAD (coronary artery disease)   . Chronic diastolic (congestive) heart failure ([place])   . Clinical trial participant    [facility] studies in familial dementia  . Dyspnea 11/28/2016  . Family history of breast cancer   . Family history of lung cancer   . Family history of prostate cancer   . Frequent UTI   . GERD (gastroesophageal reflux disease)   . Hypertension   . Osteoarthritis    hips, back  . Osteopenia   . Pneumonia   . Spinal stenosis   . Stroke [facility]) 1985    Family History  Problem Relation Age of Onset  . Lung cancer Brother 68  . Other Mother        kidney tumor  . Hypertension Mother   . Diabetes Sister   . Diabetes Brother   . Diabetes Sister        x 2  . Colon cancer Maternal Uncle   . Other Sister        intestine burst  . Breast cancer Maternal Aunt        x 2  . Breast cancer Daughter        unknown cancer  . Diabetes Son        x 3  . Diabetes Daughter   . Breast cancer Sister        x2, diagnosed in 38s and 74s  . Prostate cancer Maternal Uncle   . Esophageal cancer Neg Hx   . Rectal cancer Neg Hx   . Stomach  cancer Neg Hx     Past Surgical History:  Procedure Laterality Date  . ABDOMINAL HYSTERECTOMY     partial has one ovary  . APPENDECTOMY    . BREAST LUMPECTOMY WITH RADIOACTIVE SEED LOCALIZATION Right 04/07/2019   Procedure: RIGHT BREAST LUMPECTOMY WITH RADIOACTIVE SEED LOCALIZATION;  Surgeon: Jovita Kussmaul, MD;  Location[facility]  OR;  Service: General;  Laterality: Right;  . CATARACT EXTRACTION Bilateral   . LEFT HEART CATH AND CORONARY ANGIOGRAPHY N/A 11/29/2016   Procedure: Left Heart Cath and Coronary Angiography;  Surgeon: Sherren Mocha, MD;  Location: [facility];  Service: Cardiovascular;  Laterality: N/A;  . LUMBAR DISC SURGERY     x 3  . rotator cuff surgery Right 2010   Social History   Occupational History  . Occupation: RETIRED    Employer: RETIRED  Tobacco Use  . Smoking status: Never Smoker  . Smokeless tobacco: Never Used  Vaping Use  . Vaping Use: Never used  Substance and Sexual Activity  . Alcohol use: No    Alcohol/week: 0.0 standard drinks  . Drug use: No  . Sexual activity: Not on file

## 2020-02-08 ENCOUNTER — Ambulatory Visit
Admission: RE | Admit: 2020-02-08 | Discharge: 2020-02-08 | Disposition: A | Discharge status: Home / Self Care | Payer: Medicare HMO | Source: Ambulatory Visit | Attending: Internal Medicine | Admitting: Internal Medicine

## 2020-02-08 ENCOUNTER — Other Ambulatory Visit (HOSPITAL_COMMUNITY)
Admission: RE | Admit: 2020-02-08 | Discharge: 2020-02-08 | Disposition: A | Discharge status: Home / Self Care | Payer: Medicare HMO | Source: Ambulatory Visit | Attending: Internal Medicine | Admitting: Internal Medicine

## 2020-02-08 DIAGNOSIS — E041 Nontoxic single thyroid nodule: Secondary | ICD-10-CM | POA: Diagnosis not present

## 2020-02-09 ENCOUNTER — Encounter: Payer: Self-pay | Admitting: Internal Medicine

## 2020-02-09 LAB — CYTOLOGY - NON PAP

## 2020-03-02 ENCOUNTER — Encounter: Payer: Self-pay | Admitting: Family Medicine

## 2020-03-02 DIAGNOSIS — R922 Inconclusive mammogram: Secondary | ICD-10-CM | POA: Diagnosis not present

## 2020-03-02 DIAGNOSIS — Z853 Personal history of malignant neoplasm of breast: Secondary | ICD-10-CM | POA: Diagnosis not present

## 2020-03-21 ENCOUNTER — Telehealth: Payer: Self-pay | Admitting: Hematology and Oncology

## 2020-03-21 ENCOUNTER — Other Ambulatory Visit: Payer: Self-pay | Admitting: *Deleted

## 2020-03-21 MED ORDER — HYDROCHLOROTHIAZIDE 25 MG PO TABS: 25.0000 mg | ORAL_TABLET | Freq: Every day | ORAL | 0 refills | Status: DC

## 2020-03-21 MED ORDER — ANASTROZOLE 1 MG PO TABS: 1.0000 mg | ORAL_TABLET | Freq: Every day | ORAL | 3 refills | Status: DC

## 2020-03-21 MED ORDER — DILTIAZEM HCL ER COATED BEADS 120 MG PO CP24: 120.0000 mg | ORAL_CAPSULE | Freq: Every day | ORAL | 0 refills | Status: DC

## 2020-03-21 NOTE — Telephone Encounter (Signed)
Scheduled appointment per 10/12 scheduling message. Spoke to patient who is aware of appointment time and date.

## 2020-03-22 ENCOUNTER — Telehealth: Payer: Self-pay

## 2020-03-22 MED ORDER — METHYLPREDNISOLONE 4 MG PO TABS: ORAL_TABLET | ORAL | 0 refills | Status: DC

## 2020-03-22 NOTE — Telephone Encounter (Signed)
Requesting methylprednisolone 4mg  refilled to her [facility]

## 2020-04-17 ENCOUNTER — Other Ambulatory Visit: Payer: Self-pay

## 2020-04-17 ENCOUNTER — Inpatient Hospital Stay: Payer: Medicare HMO | Attending: Hematology and Oncology | Admitting: Hematology and Oncology

## 2020-04-17 DIAGNOSIS — Z79899 Other long term (current) drug therapy: Secondary | ICD-10-CM | POA: Diagnosis not present

## 2020-04-17 DIAGNOSIS — Z170 Estrogen receptor positive status [ER+]: Secondary | ICD-10-CM

## 2020-04-17 DIAGNOSIS — Z923 Personal history of irradiation: Secondary | ICD-10-CM | POA: Insufficient documentation

## 2020-04-17 DIAGNOSIS — M545 Low back pain, unspecified: Secondary | ICD-10-CM | POA: Insufficient documentation

## 2020-04-17 DIAGNOSIS — C50411 Malignant neoplasm of upper-outer quadrant of right female breast: Secondary | ICD-10-CM

## 2020-04-17 DIAGNOSIS — Z79811 Long term (current) use of aromatase inhibitors: Secondary | ICD-10-CM | POA: Diagnosis not present

## 2020-04-17 DIAGNOSIS — M810 Age-related osteoporosis without current pathological fracture: Secondary | ICD-10-CM | POA: Insufficient documentation

## 2020-04-17 DIAGNOSIS — Z7982 Long term (current) use of aspirin: Secondary | ICD-10-CM | POA: Diagnosis not present

## 2020-04-17 NOTE — Progress Notes (Signed)
Patient Care Team: Tower, Wynelle Fanny, MD as PCP - General Marlou Porch Thana Farr, MD as PCP - Cardiology (Cardiology) Jovita Kussmaul, MD as Consulting Physician (General Surgery) Eppie Gibson, MD as Attending Physician (Radiation Oncology) Nicholas Lose, MD as Consulting Physician (Hematology and Oncology)  DIAGNOSIS:    ICD-10-CM   1. Carcinoma of upper-outer quadrant of right breast in female, estrogen receptor positive ([place])  C50.411 Ambulatory referral to Physical Therapy   Z17.0     SUMMARY OF ONCOLOGIC HISTORY: Oncology History  Carcinoma of upper-outer quadrant of right breast in female, estrogen receptor positive ([place])  02/23/2019 Initial Diagnosis   Routine screening mammogram detected a 1.2cm mass in the right breast. Biopsy showed invasive mammary carcinoma with mammary carcinoma in situ, HER-2 - (1+), ER+ 100%, PR+ 5% Ki67 2%.    02/23/2019 Cancer Staging   Staging form: Breast, AJCC 8th Edition - Clinical stage from 02/23/2019: cT1c, cN0, cM0, GX, ER+, PR+, HER2-    04/07/2019 Cancer Staging   Staging form: Breast, AJCC 8th Edition - Pathologic stage from 04/07/2019: Stage IA (pT1c, pN0, cM0, G2, ER+, PR+, HER2-)    04/07/2019 Surgery   Right lumpectomy Marl[facility] Starks) [phone]): IDC, grade 2, 1.3cm, with intermediate grade DCIS, clear margins. No regional lymph nodes were examined.   05/10/2019 - 05/28/2019 Radiation Therapy   The patient initially received a dose of 40.05 Gy in 15 fractions to the right breast and axilla using whole-breast tangent fields. This was delivered using a 3-D conformal technique. The pt did not receive a boost. The total dose was 40.05 Gy.   04/2019 - 04/2024 Anti-estrogen oral therapy   Anastrozole     CHIEF COMPLIANT: Follow-up of right breast cancer on anastrozole   INTERVAL HISTORY: Sara [name] is a 86 y.o. with above-mentioned history of right breast cancer who underwent a lumpectomy, radiation, and is currently on antiestrogen  therapy with anastrozole. Mammogram on 03/02/20 showed no evidence of malignancy bilaterally. She presents to the clinic today for follow-up.  Her major complaint today is bilateral groin pain as well as low back pain.  She is otherwise been healthy.  ALLERGIES:  is allergic to amoxicillin, atorvastatin, cefpodoxime proxetil, clarithromycin, fosamax [alendronate sodium], furosemide, and gabapentin.  MEDICATIONS:  Current Outpatient Medications  Medication Sig Dispense Refill  . acetaminophen (TYLENOL) 500 MG tablet Take 1,000 mg by mouth every 6 (six) hours as needed.    Marland Kitchen anastrozole (ARIMIDEX) 1 MG tablet Take 1 tablet (1 mg total) by mouth daily. 90 tablet 3  . aspirin EC 81 MG tablet Take 81 mg by mouth daily.      . Cholecalciferol (VITAMIN D-3) 1000 UNITS CAPS Take 1,000 Units by mouth daily.     Marland Kitchen diltiazem (CARDIZEM CD) 120 MG 24 hr capsule Take 1 capsule (120 mg total) by mouth daily. 90 capsule 0  . Docusate Sodium (EQ STOOL SOFTENER PO) Take 100 mg by mouth daily as needed (constipation).     Marland Kitchen doxylamine, Sleep, (EQ SLEEP AID) 25 MG tablet Take 25 mg by mouth at bedtime as needed for sleep.     . hydrochlorothiazide (HYDRODIURIL) 25 MG tablet Take 1 tablet (25 mg total) by mouth daily. 90 tablet 0  . methylPREDNISolone (MEDROL) 4 MG tablet Medrol dose pack. Take as instructed 21 tablet 0  . nitroGLYCERIN (NITROSTAT) 0.4 MG SL tablet Place 1 tablet (0.4 mg total) under the tongue every 5 (five) minutes as needed for chest pain (max 3 doses in 15 minutes).  25 tablet 3  . polyethylene glycol (MIRALAX / GLYCOLAX) packet Take 17 g by mouth daily.     . potassium chloride (KLOR-CON) 10 MEQ tablet Take 1 tablet (10 mEq total) by mouth daily. 90 tablet 1  . traMADol (ULTRAM) 50 MG tablet Take 1 tablet (50 mg total) by mouth every 6 (six) hours as needed. (Patient taking differently: Take 50 mg by mouth every 6 (six) hours as needed for moderate pain. ) 30 tablet 0  . vitamin B-12  (CYANOCOBALAMIN) 1000 MCG tablet Take 1,000 mcg by mouth daily.     No current facility-administered medications for this visit.    PHYSICAL EXAMINATION: ECOG PERFORMANCE STATUS: 1 - Symptomatic but completely ambulatory  Vitals:   04/17/20 1414  BP: (!) 142/82  Pulse: 61  Resp: 17  Temp: 98.9 F (37.2 C)  SpO2: 97%   Filed Weights   04/17/20 1414  Weight: 150 lb 3.2 oz (68.1 kg)    BREAST: No palpable masses or nodules in either right or left breasts. No palpable axillary supraclavicular or infraclavicular adenopathy no breast tenderness or nipple discharge. (exam performed in the presence of a chaperone)  LABORATORY DATA:  I have reviewed the data as listed CMP Latest Ref Rng & Units 12/27/2019 11/16/2019 04/02/2019  Glucose 65 - 99 mg/dL 129(H) 107(H) 116(H)  BUN 8 - 27 mg/dL '12 20 12  ' Creatinine 0.57 - 1.00 mg/dL 0.85 0.87 0.88  Sodium 134 - 144 mmol/L 139 141 141  Potassium 3.5 - 5.2 mmol/L 3.8 3.7 3.7  Chloride 96 - 106 mmol/L 100 103 103  CO2 20 - 29 mmol/L '26 29 29  ' Calcium 8.7 - 10.3 mg/dL 9.8 9.8 10.0  Total Protein 6.0 - 8.3 g/dL - - -  Total Bilirubin 0.2 - 1.2 mg/dL - - -  Alkaline Phos 39 - 117 U/L - - -  AST 0 - 37 U/L - - -  ALT 0 - 35 U/L - - -    Lab Results  Component Value Date   WBC 8.7 11/16/2019   HGB 13.2 11/16/2019   HCT 42.7 11/16/2019   MCV 90.5 11/16/2019   PLT 195 11/16/2019   NEUTROABS 3.3 01/05/2019    ASSESSMENT & PLAN:  Carcinoma of upper-outer quadrant of right breast in female, estrogen receptor positive ([place]) 04/25/2019:Routine screening mammogram detected a 1.2cm mass in the right breast. Biopsy showed invasive mammary carcinoma with mammary carcinoma in situ, HER-2 - (1+), ER+ 100%, PR+ 5% Ki67 2%. T1c N0 stage Ia clinical stage  Recommendations: 1.  Right lumpectomy 04/07/2019: Grade 2 IDC 1.3 cm with intermediate grade DCIS, margins negative, negative for lymphovascular or perineural invasion, ER 100%, PR 5%, HER-2 -1+,  Ki-67 2%, T1c Nx stage Ia 2.did not recommend adjuvant radiation therapy  3. Adjuvant antiestrogen therapy with anastrozole started 04/13/2019  Anastrozole toxicities: Denies any major adverse effects. She does have back pain as well as groin pain but it is related to arthritis.  Osteoporosis: I would like to get another bone density test.  She gets her mammograms at [facility]. I will request [facility] to get a bone density test on her.  Breast cancer surveillance: 1.  Mammogram at [facility] 03/02/2020: Benign breast density category C 2. breast exam 04/17/2020: Benign  Return to clinic in 1 year for follow-up    Orders Placed This Encounter  Procedures  . Ambulatory referral to Physical Therapy    Referral Priority:   Routine    Referral Type:  Physical Medicine    Referral Reason:   Specialty Services Required    Requested Specialty:   Physical Therapy    Number of Visits Requested:   1   The patient has a good understanding of the overall plan. she agrees with it. she will call with any problems that may develop before the next visit here.  Total time spent: 20 mins including face to face time and time spent for planning, charting and coordination of care  Nicholas Lose, MD 04/17/2020  I, Cloyde Reams Dorshimer, am acting as scribe for Dr. Nicholas Lose.  I have reviewed the above documentation for accuracy and completeness, and I agree with the above.

## 2020-04-17 NOTE — Assessment & Plan Note (Signed)
04/25/2019:Routine screening mammogram detected a 1.2cm mass in the right breast. Biopsy showed invasive mammary carcinoma with mammary carcinoma in situ, HER-2 - (1+), ER+ 100%, PR+ 5% Ki67 2%. T1c N0 stage Ia clinical stage  Recommendations: 1.  Right lumpectomy 04/07/2019: Grade 2 IDC 1.3 cm with intermediate grade DCIS, margins negative, negative for lymphovascular or perineural invasion, ER 100%, PR 5%, HER-2 -1+, Ki-67 2%, T1c Nx stage Ia 2.did not recommend adjuvant radiation therapy  3. Adjuvant antiestrogen therapy with anastrozole started 04/13/2019  Anastrozole toxicities:  I recommended that she take bisphosphonates because of her history of osteoporosis.  Breast cancer surveillance: 1.  Mammogram at [facility] 03/02/2020: Benign breast density category C 2. breast exam 04/17/2020: Benign  Return to clinic in 1 year for follow-up

## 2020-04-18 ENCOUNTER — Other Ambulatory Visit: Payer: Self-pay | Admitting: Family Medicine

## 2020-04-26 ENCOUNTER — Ambulatory Visit: Payer: Medicare HMO | Attending: Hematology and Oncology

## 2020-04-26 ENCOUNTER — Other Ambulatory Visit: Payer: Self-pay

## 2020-04-26 DIAGNOSIS — C50411 Malignant neoplasm of upper-outer quadrant of right female breast: Secondary | ICD-10-CM | POA: Diagnosis not present

## 2020-04-26 DIAGNOSIS — M6281 Muscle weakness (generalized): Secondary | ICD-10-CM | POA: Diagnosis not present

## 2020-04-26 DIAGNOSIS — N63 Unspecified lump in unspecified breast: Secondary | ICD-10-CM | POA: Diagnosis not present

## 2020-04-26 DIAGNOSIS — M79621 Pain in right upper arm: Secondary | ICD-10-CM | POA: Insufficient documentation

## 2020-04-26 DIAGNOSIS — Z170 Estrogen receptor positive status [ER+]: Secondary | ICD-10-CM | POA: Diagnosis not present

## 2020-04-26 NOTE — Patient Instructions (Signed)
Pt and her daughter given information about attending ABC class virtually

## 2020-04-26 NOTE — Therapy (Signed)
[place], [place], [address] Phone: [phone]   Fax:  [phone]  Physical Therapy Evaluation  Patient Details  Name: Sara [name] MRN: [id number] Date of Birth: 1932-04-09 Referring Provider (PT): Dr. Lindi Adie   Encounter Date: 04/26/2020   PT End of Session - 04/26/20 2042    Visit Number 1    Number of Visits 13    Date for PT Re-Evaluation 06/07/20    PT Start Time 1604    PT Stop Time 1655    PT Time Calculation (min) 51 min    Activity Tolerance Patient tolerated treatment well    Behavior During Therapy [facility] for tasks assessed/performed           Past Medical History:  Diagnosis Date  . Allergy   . CAD (coronary artery disease)   . Chronic diastolic (congestive) heart failure ([facility])   . Clinical trial participant    [facility] studies in familial dementia  . Dyspnea 11/28/2016  . Family history of breast cancer   . Family history of lung cancer   . Family history of prostate cancer   . Frequent UTI   . GERD (gastroesophageal reflux disease)   . Hypertension   . Osteoarthritis    hips, back  . Osteopenia   . Pneumonia   . Spinal stenosis   . Stroke [facility]) 1985    Past Surgical History:  Procedure Laterality Date  . ABDOMINAL HYSTERECTOMY     partial has one ovary  . APPENDECTOMY    . BREAST LUMPECTOMY WITH RADIOACTIVE SEED LOCALIZATION Right 04/07/2019   Procedure: RIGHT BREAST LUMPECTOMY WITH RADIOACTIVE SEED LOCALIZATION;  Surgeon: Jovita Kussmaul, MD;  Location: [place];  Service: General;  Laterality: Right;  . CATARACT EXTRACTION Bilateral   . LEFT HEART CATH AND CORONARY ANGIOGRAPHY N/A 11/29/2016   Procedure: Left Heart Cath and Coronary Angiography;  Surgeon: Sherren Mocha, MD;  Location: [place] [facility];  Service: Cardiovascular;  Laterality: N/A;  . LUMBAR DISC SURGERY     x 3  . rotator cuff surgery Right 2010    There were no vitals filed  for this visit.    Subjective Assessment - 04/26/20 1611    Subjective Has some pain/tightness under right arm and can't use it as well, but my left shoulder hurts more.  Doctor is sending me for breast swelling, but I don't see it.    Patient is accompained by: Family member   daughter Pamala Hurry   Limitations Lifting;House hold activities    Patient Stated Goals Decrease axillary pain , use shoulder better, decrease breast swelling    Currently in Pain? No/denies              [facility] PT Assessment - 04/26/20 0001      Assessment   Medical Diagnosis Carcinoma of upper outer quadrant right breast ER+    Referring Provider (PT) Dr. Lindi Adie    Onset Date/Surgical Date 04/07/19    Hand Dominance Right    Prior Therapy no      Precautions   Precautions Back;Shoulder    Type of Shoulder Precautions --   left shoulder limitations in ROM with pain, chronic LBP     Restrictions   Weight Bearing Restrictions No      Balance Screen   Has the patient fallen in the past 6 months No    Has the patient had a decrease in activity level because of a  fear of falling?  No    Is the patient reluctant to leave their home because of a fear of falling?  No      Home Environment   Living Environment Private residence    Living Arrangements Alone    Available Help at Discharge Family      Prior Function   Level of Independence Independent      Cognition   Overall Cognitive Status Within Functional Limits for tasks assessed      Observation/Other Assessments-Edema    Edema --   mild fibrosis at R breast incision area and inferior breast     Posture/Postural Control   Posture/Postural Control Postural limitations    Postural Limitations Rounded Shoulders;Forward head      AROM   Overall AROM Comments --   Left AROM limited by pain, weakness, Right by axillary tight   Right Shoulder Extension 42 Degrees    Right Shoulder Flexion 128 Degrees    Right Shoulder ABduction 92 Degrees    Left  Shoulder Extension 64 Degrees    Left Shoulder Flexion 103 Degrees    Left Shoulder ABduction 85 Degrees      Palpation   Palpation comment tender at right axillary region             LYMPHEDEMA/ONCOLOGY QUESTIONNAIRE - 04/26/20 0001      Surgeries   Lumpectomy Date 04/07/19    Sentinel Lymph Node Biopsy Date 04/07/19      Treatment   Active Chemotherapy Treatment No    Past Chemotherapy Treatment No    Active Radiation Treatment No    Past Radiation Treatment Yes    Body Site breast    Current Hormone Treatment Yes    Drug Name anastrozole      What other symptoms do you have   Are you Having Heaviness or Tightness Yes   axilla   Are you having Pain Yes   axilla, left shoulder, back   Are you having pitting edema No    Is it Hard or Difficult finding clothes that fit No    Do you have infections No      Right Upper Extremity Lymphedema   At Axilla  33.8 cm    10 cm Proximal to Olecranon Process 30.5 cm    Olecranon Process 27.1 cm    15 cm Proximal to Ulnar Styloid Process 25.3 cm    10 cm Proximal to Ulnar Styloid Process 20.9 cm    Just Proximal to Ulnar Styloid Process 17.2 cm    Across Hand at [employer] 21.5 cm    At [place] of 2nd Digit 6.8 cm      Left Upper Extremity Lymphedema   At Axilla  31.3 cm    10 cm Proximal to Olecranon Process 30.2 cm    Olecranon Process 26.7 cm    15 cm Proximal to Ulnar Styloid Process 24.2 cm    10 cm Proximal to Ulnar Styloid Process 19.6 cm    Just Proximal to Ulnar Styloid Process 16.5 cm    Across Hand at [employer] 21 cm    At [place] of 2nd Digit 6.7 cm                   Objective measurements completed on examination: See above findings.                 PT Short Term Goals - 04/26/20 2100  PT SHORT TERM GOAL #1   Title Pt. will be independant with HEP for Right shoulder ROM/strength    Time 4    Period Weeks    Status New             PT Long Term Goals - 04/26/20 2103        PT LONG TERM GOAL #1   Title Pt. will have right shoulder flex and abd atleast 140 degrees for improved reaching ability    Time 6    Period Weeks    Status New    Target Date 06/07/20      PT LONG TERM GOAL #2   Title pt will have decreased fibrosis at right breast over incision and inferior breast    Time 6    Period Weeks    Status New      PT LONG TERM GOAL #3   Title Pt will report decreased axillary tightness by greater than 50%    Time 6    Period Weeks    Status New    Target Date 06/07/20                  Plan - 04/26/20 2043    Clinical Impression Statement Pt was referred for breast swelling and she does appear to have mild fibrosis near incision and inferior medial breast today that is non tender.  Her AROM for right shoulder is limited secondary to axillary tightness, and left AROM limited by pain and weakness.  She had a right RTC repair in 2010, has chronic LBP s/p sx x 3    Personal Factors and Comorbidities Comorbidity 3+;Age    Comorbidities s/p breast CA, prior CVA, CAD, lumbar sx x 3, Right RTC repai    Examination-Activity Limitations Dressing;Lift;Reach Overhead    Examination-Participation Restrictions Cleaning    Stability/Clinical Decision Making Stable/Uncomplicated    Clinical Decision Making Low    Rehab Potential Good    PT Frequency 2x / week    PT Duration 6 weeks    PT Treatment/Interventions ADLs/Self Care Home Management;Therapeutic exercise;Therapeutic activities;Neuromuscular re-education;Manual techniques;Patient/family education;Manual lymph drainage;Passive range of motion    PT Next Visit Plan HEP instruction for ROM;mindful of left shoulder and back pain, MFR axillary region. PROM right shoulder, ? chip pack for bra/MLD    Consulted and Agree with Plan of Care Patient;Family member/caregiver    Family Member Consulted daughter Pamala Hurry           Patient will benefit from skilled therapeutic intervention in order to  improve the following deficits and impairments:  Decreased activity tolerance, Decreased knowledge of precautions, Decreased range of motion, Decreased strength, Increased edema, Postural dysfunction, Impaired UE functional use  Visit Diagnosis: Carcinoma of upper-outer quadrant of right breast in female, estrogen receptor positive ([place])  Axillary pain, right  Muscle weakness (generalized)  Breast swelling     Problem List Patient Active Problem List   Diagnosis Date Noted  . Neck pain 01/18/2020  . Left shoulder pain 01/18/2020  . Pain in both upper arms 01/18/2020  . Thyroid nodule 01/04/2020  . Flank pain 11/22/2019  . Hand pain, right 10/18/2019  . Family history of breast cancer   . Family history of prostate cancer   . Family history of lung cancer   . Carcinoma of upper-outer quadrant of right breast in female, estrogen receptor positive ([place]) 02/23/2019  . Pre-operative clearance 02/21/2019  . History of breast cancer 02/21/2019  . Medicare annual wellness  visit, subsequent 01/05/2019  . Encounter for screening mammogram for breast cancer 01/05/2019  . Knee pain, left 10/14/2018  . Arthritis of left hip 07/29/2018  . Bradycardia 07/01/2018  . Dyspnea 07/01/2018  . Rhinorrhea 01/05/2018  . Chronic diastolic heart failure ([place]) 12/27/2017  . Lumbar spinal stenosis 03/11/2017  . Diastolic dysfunction 78/46/9629  . Estrogen deficiency 12/16/2016  . Routine general medical examination at a health care facility 12/16/2016  . CAD (coronary artery disease) 12/04/2016  . Dyspepsia 03/15/2016  . 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 Eczema 03/06/2015  . Prediabetes 02/11/2014  . Intertrigo labialis 12/07/2012  . CONSTIPATION, CHRONIC 07/03/2010  . Osteoporosis 06/24/2008  . HYPERCHOLESTEROLEMIA 12/08/2006  . Essential hypertension 12/08/2006  . GERD 12/08/2006  . H/O herpes zoster 12/08/2006    Claris Pong 04/26/2020, 9:07 PM  [place], [place], [address] Phone: [phone]   Fax:  [phone]  Name: NAKESHA EBRAHIM MRN: [id number] Date of Birth: 1931/06/14 Cheral Almas, PT 04/26/20 9:15 PM

## 2020-04-29 ENCOUNTER — Other Ambulatory Visit: Payer: Self-pay | Admitting: Hematology and Oncology

## 2020-05-10 ENCOUNTER — Ambulatory Visit: Payer: Medicare HMO | Attending: Hematology and Oncology

## 2020-05-10 ENCOUNTER — Other Ambulatory Visit: Payer: Self-pay

## 2020-05-10 DIAGNOSIS — M6281 Muscle weakness (generalized): Secondary | ICD-10-CM

## 2020-05-10 DIAGNOSIS — M79621 Pain in right upper arm: Secondary | ICD-10-CM | POA: Diagnosis not present

## 2020-05-10 DIAGNOSIS — Z170 Estrogen receptor positive status [ER+]: Secondary | ICD-10-CM | POA: Diagnosis not present

## 2020-05-10 DIAGNOSIS — N63 Unspecified lump in unspecified breast: Secondary | ICD-10-CM

## 2020-05-10 DIAGNOSIS — C50411 Malignant neoplasm of upper-outer quadrant of right female breast: Secondary | ICD-10-CM | POA: Diagnosis not present

## 2020-05-10 NOTE — Therapy (Signed)
[place], [place], [address] Phone: [phone]   Fax:  [phone]  Physical Therapy Treatment  Patient Details  Name: Sara [name] MRN: [id number] Date of Birth: [birth year] Referring Provider (PT): Dr. Lindi Adie   Encounter Date: 05/10/2020   PT End of Session - 05/10/20 1057    Visit Number 2    Number of Visits 13    Date for PT Re-Evaluation 06/07/20    PT Start Time 1000    PT Stop Time 1050    PT Time Calculation (min) 50 min    Activity Tolerance Patient tolerated treatment well    Behavior During Therapy [facility] for tasks assessed/performed           Past Medical History:  Diagnosis Date  . Allergy   . CAD (coronary artery disease)   . Chronic diastolic (congestive) heart failure ([place])   . Clinical trial participant    [facility] [place] studies in familial dementia  . Dyspnea 11/28/2016  . Family history of breast cancer   . Family history of lung cancer   . Family history of prostate cancer   . Frequent UTI   . GERD (gastroesophageal reflux disease)   . Hypertension   . Osteoarthritis    hips, back  . Osteopenia   . Pneumonia   . Spinal stenosis   . Stroke [facility]) 1985    Past Surgical History:  Procedure Laterality Date  . ABDOMINAL HYSTERECTOMY     partial has one ovary  . APPENDECTOMY    . BREAST LUMPECTOMY WITH RADIOACTIVE SEED LOCALIZATION Right 04/07/2019   Procedure: RIGHT BREAST LUMPECTOMY WITH RADIOACTIVE SEED LOCALIZATION;  Surgeon: Jovita Kussmaul, MD;  Location: [place];  Service: General;  Laterality: Right;  . CATARACT EXTRACTION Bilateral   . LEFT HEART CATH AND CORONARY ANGIOGRAPHY N/A 11/29/2016   Procedure: Left Heart Cath and Coronary Angiography;  Surgeon: Sherren Mocha, MD;  Location: [place] [facility];  Service: Cardiovascular;  Laterality: N/A;  . LUMBAR DISC SURGERY     x 3  . rotator cuff surgery Right 2010    There were no vitals filed  for this visit.   Subjective Assessment - 05/10/20 1000    Subjective Right armpit is still a little sore underneath.  Left shoulder still giving her problems and has some numbness in left hand greater than right.  Hands are stiff from arthritis    Limitations Lifting;House hold activities    Patient Stated Goals Decrease axillary pain , use shoulder better, decrease breast swelling    Currently in Pain? Yes    Pain Score 3     Pain Location Axilla    Pain Orientation Right    Pain Descriptors / Indicators Tightness    Pain Type Surgical pain    Pain Onset More than a month ago    Pain Frequency Intermittent    Aggravating Factors  reaching    Multiple Pain Sites Yes    Pain Score 4    Pain Location Shoulder    Pain Orientation Left    Pain Descriptors / Indicators Aching    Pain Type Chronic pain    Pain Onset More than a month ago    Pain Frequency Intermittent                             [facility] Adult PT Treatment/Exercise - 05/10/20 0001  Manual Therapy   Manual therapy comments HOB slightly elevated during treatment.scar area softened up nicely with STM and MLD    Edema Management discussed use of compression bra with pt and her daughter and a bra that covers incision area to give ore compression    Soft tissue mobilization to right pectorals and  breast incision    Manual Lymphatic Drainage (MLD) short neck, Left axillary LN, right inguinal LN  anterior interaxillary pathway, right axillo inguinal pathway, and right breast areas of swelling repeating all pathways.    Passive ROM To right shoulder in supine for flex, abd and ER                  PT Education - 05/10/20 1055    Education Details Pt was educated in supine AAROM for shoulder flexion and advised to take to point of gentle stretch but not pain, being mindful to avoid pain in left shoulder. Pt was able to perform in clinic with stretch only bilaterally.    Person(s) Educated Patient     Methods Explanation;Demonstration;Handout    Comprehension Returned demonstration;Verbalized understanding            PT Short Term Goals - 04/26/20 2100      PT SHORT TERM GOAL #1   Title Pt. will be independant with HEP for Right shoulder ROM/strength    Time 4    Period Weeks    Status New             PT Long Term Goals - 04/26/20 2103      PT LONG TERM GOAL #1   Title Pt. will have right shoulder flex and abd atleast 140 degrees for improved reaching ability    Time 6    Period Weeks    Status New    Target Date 06/07/20      PT LONG TERM GOAL #2   Title pt will have decreased fibrosis at right breast over incision and inferior breast    Time 6    Period Weeks    Status New      PT LONG TERM GOAL #3   Title Pt will report decreased axillary tightness by greater than 50%    Time 6    Period Weeks    Status New    Target Date 06/07/20                 Plan - 05/10/20 1058    Clinical Impression Statement treatment consisted of STW to pectoral region and scar tissue mobilization, MLD to right breast and PROM to right shoulder.  Pt was instructed in AAROM with hands clasped for flexion and was able to return demonstrate. Area of incision softened completely and inferior breast also improved with MLD. She had good tolerance for PROM of right shoulder with pulling only.  We discussed use of a compression bra or sports bra to keep fluid from reacumulating.    Personal Factors and Comorbidities Comorbidity 3+;Age    Comorbidities s/p breast CA, prior CVA, CAD, lumbar sx x 3, Right RTC repai    Examination-Activity Limitations Dressing;Lift;Reach Overhead    Examination-Participation Restrictions Cleaning    Stability/Clinical Decision Making Stable/Uncomplicated    Clinical Decision Making Low    Rehab Potential Good    PT Frequency 2x / week    PT Duration 6 weeks    PT Treatment/Interventions ADLs/Self Care Home Management;Therapeutic exercise;Therapeutic  activities;Neuromuscular re-education;Manual techniques;Patient/family education;Manual lymph drainage;Passive range of motion  PT Next Visit Plan Continue Soft tissue work, scar mobilization, MLD, see if she found appropriate bra, may consider chip pack prn           Patient will benefit from skilled therapeutic intervention in order to improve the following deficits and impairments:  Decreased activity tolerance, Decreased knowledge of precautions, Decreased range of motion, Decreased strength, Increased edema, Postural dysfunction, Impaired UE functional use  Visit Diagnosis: Carcinoma of upper-outer quadrant of right breast in female, estrogen receptor positive ([place])  Axillary pain, right  Muscle weakness (generalized)  Breast swelling     Problem List Patient Active Problem List   Diagnosis Date Noted  . Neck pain 01/18/2020  . Left shoulder pain 01/18/2020  . Pain in both upper arms 01/18/2020  . Thyroid nodule 01/04/2020  . Flank pain 11/22/2019  . Hand pain, right 10/18/2019  . Family history of breast cancer   . Family history of prostate cancer   . Family history of lung cancer   . Carcinoma of upper-outer quadrant of right breast in female, estrogen receptor positive ([place]) 02/23/2019  . Pre-operative clearance 02/21/2019  . History of breast cancer 02/21/2019  . Medicare annual wellness visit, subsequent 01/05/2019  . Encounter for screening mammogram for breast cancer 01/05/2019  . Knee pain, left 10/14/2018  . Arthritis of left hip 07/29/2018  . Bradycardia 07/01/2018  . Dyspnea 07/01/2018  . Rhinorrhea 01/05/2018  . Chronic diastolic heart failure ([place]) 12/27/2017  . Lumbar spinal stenosis 03/11/2017  . Diastolic dysfunction 60/45/4098  . Estrogen deficiency 12/16/2016  . Routine general medical examination at a health care facility 12/16/2016  . CAD (coronary artery disease) 12/04/2016  . Dyspepsia 03/15/2016  . 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  Eczema 03/06/2015  . Prediabetes 02/11/2014  . Intertrigo labialis 12/07/2012  . CONSTIPATION, CHRONIC 07/03/2010  . Osteoporosis 06/24/2008  . HYPERCHOLESTEROLEMIA 12/08/2006  . Essential hypertension 12/08/2006  . GERD 12/08/2006  . H/O herpes zoster 12/08/2006    Sara [name] 05/10/2020, 12:35 PM  [place], [place], [address] Phone: [phone]   Fax:  [phone]  Name: Sara [name] MRN: [id number] Date of Birth: [birth year] Cheral Almas, PT 05/10/20 12:36 PM

## 2020-05-10 NOTE — Patient Instructions (Signed)
Access Code: [email or web address] URL: [email or web address] Date: 05/10/2020 Prepared by: Cheral Almas  Exercises Supine Shoulder Flexion AAROM with Hands Clasped - 1-2 x daily - 7 x weekly - 1 sets - 5 sec hold

## 2020-05-15 ENCOUNTER — Other Ambulatory Visit: Payer: Self-pay | Admitting: Family Medicine

## 2020-05-16 ENCOUNTER — Ambulatory Visit: Payer: Medicare HMO

## 2020-05-16 ENCOUNTER — Other Ambulatory Visit: Payer: Self-pay

## 2020-05-16 DIAGNOSIS — C50411 Malignant neoplasm of upper-outer quadrant of right female breast: Secondary | ICD-10-CM | POA: Diagnosis not present

## 2020-05-16 DIAGNOSIS — M6281 Muscle weakness (generalized): Secondary | ICD-10-CM

## 2020-05-16 DIAGNOSIS — M79621 Pain in right upper arm: Secondary | ICD-10-CM

## 2020-05-16 DIAGNOSIS — N63 Unspecified lump in unspecified breast: Secondary | ICD-10-CM | POA: Diagnosis not present

## 2020-05-16 DIAGNOSIS — Z170 Estrogen receptor positive status [ER+]: Secondary | ICD-10-CM

## 2020-05-16 NOTE — Therapy (Signed)
[place], [place], [address] Phone: [phone]   Fax:  [phone]  Physical Therapy Treatment  Patient Details  Name: Sara [name] MRN: [id number] Date of Birth: 17-Mar-1932 Referring Provider (PT): Dr. Lindi Adie   Encounter Date: 05/16/2020   PT End of Session - 05/16/20 1202    Visit Number 3    Number of Visits 13    Date for PT Re-Evaluation 06/07/20    PT Start Time 1100    PT Stop Time 1150    PT Time Calculation (min) 50 min    Activity Tolerance Patient tolerated treatment well    Behavior During Therapy [facility] for tasks assessed/performed           Past Medical History:  Diagnosis Date  . Allergy   . CAD (coronary artery disease)   . Chronic diastolic (congestive) heart failure ([place])   . Clinical trial participant    [facility] studies in familial dementia  . Dyspnea 11/28/2016  . Family history of breast cancer   . Family history of lung cancer   . Family history of prostate cancer   . Frequent UTI   . GERD (gastroesophageal reflux disease)   . Hypertension   . Osteoarthritis    hips, back  . Osteopenia   . Pneumonia   . Spinal stenosis   . Stroke [facility]) 1985    Past Surgical History:  Procedure Laterality Date  . ABDOMINAL HYSTERECTOMY     partial has one ovary  . APPENDECTOMY    . BREAST LUMPECTOMY WITH RADIOACTIVE SEED LOCALIZATION Right 04/07/2019   Procedure: RIGHT BREAST LUMPECTOMY WITH RADIOACTIVE SEED LOCALIZATION;  Surgeon: Jovita Kussmaul, MD;  Location: [place];  Service: General;  Laterality: Right;  . CATARACT EXTRACTION Bilateral   . LEFT HEART CATH AND CORONARY ANGIOGRAPHY N/A 11/29/2016   Procedure: Left Heart Cath and Coronary Angiography;  Surgeon: Sherren Mocha, MD;  Location: [place] [facility];  Service: Cardiovascular;  Laterality: N/A;  . LUMBAR DISC SURGERY     x 3  . rotator cuff surgery Right 2010    There were no vitals filed  for this visit.   Subjective Assessment - 05/16/20 1059    Subjective Not as much tightness under right armpit.  My daughter bought me a compression bra.   My breast doesn't feel quite as firm.. I have been doing the exercises you showed me.   Limitations Lifting;House hold activities    Currently in Pain? Yes    Pain Score 3     Pain Location Shoulder    Pain Orientation Left    Pain Descriptors / Indicators Aching    Pain Type Chronic pain    Pain Onset More than a month ago    Pain Frequency Intermittent                             [facility] Adult PT Treatment/Exercise - 05/16/20 0001      Manual Therapy   Edema Management pt came in with compression bra.  Added small chip pack at superior breast area of firmness    Soft tissue mobilization to right pectorals and  breast incision    Manual Lymphatic Drainage (MLD) short neck, Left axillary LN, right inguinal LN  anterior interaxillary pathway, right axillo inguinal pathway, and right breast areas of swelling repeating all pathways.    Passive ROM To right shoulder  in supine for flex, abd and ER                    PT Short Term Goals - 04/26/20 2100      PT SHORT TERM GOAL #1   Title Pt. will be independant with HEP for Right shoulder ROM/strength    Time 4    Period Weeks    Status New             PT Long Term Goals - 04/26/20 2103      PT LONG TERM GOAL #1   Title Pt. will have right shoulder flex and abd atleast 140 degrees for improved reaching ability    Time 6    Period Weeks    Status New    Target Date 06/07/20      PT LONG TERM GOAL #2   Title pt will have decreased fibrosis at right breast over incision and inferior breast    Time 6    Period Weeks    Status New      PT LONG TERM GOAL #3   Title Pt will report decreased axillary tightness by greater than 50%    Time 6    Period Weeks    Status New    Target Date 06/07/20                 Plan - 05/16/20 1202     Clinical Impression Statement Treatment consisted of STW to pectoral region and scar mobilization, MLD to right breast and PROM to right shoulder.  Pts shoulder ROM has made good improvement and she is tolerant of her HEP without increased left shoulder pain.  Prox breast with mild firmness and mild firmness at medial inferior breast.  Pt has a zip up compression bra that is slightly big, but a chip pack was made to place at upper area of firmness.  Pt verbalized understanding    Personal Factors and Comorbidities Comorbidity 3+;Age    Examination-Activity Limitations Dressing;Lift;Reach Overhead    Examination-Participation Restrictions Cleaning    Stability/Clinical Decision Making Stable/Uncomplicated    Clinical Decision Making Low    Rehab Potential Good    PT Frequency 2x / week    PT Duration 6 weeks    PT Treatment/Interventions ADLs/Self Care Home Management;Therapeutic exercise;Therapeutic activities;Neuromuscular re-education;Manual techniques;Patient/family education;Manual lymph drainage;Passive range of motion    PT Next Visit Plan continue Soft tissue work, scar mobilization, MLD, check benefit of chip pack.    Consulted and Agree with Plan of Care Patient           Patient will benefit from skilled therapeutic intervention in order to improve the following deficits and impairments:  Decreased activity tolerance, Decreased knowledge of precautions, Decreased range of motion, Decreased strength, Increased edema, Postural dysfunction, Impaired UE functional use  Visit Diagnosis: Carcinoma of upper-outer quadrant of right breast in female, estrogen receptor positive (HCC)  Axillary pain, right  Muscle weakness (generalized)  Breast swelling     Problem List Patient Active Problem List   Diagnosis Date Noted  . Neck pain 01/18/2020  . Left shoulder pain 01/18/2020  . Pain in both upper arms 01/18/2020  . Thyroid nodule 01/04/2020  . Flank pain 11/22/2019  . Hand  pain, right 10/18/2019  . Family history of breast cancer   . Family history of prostate cancer   . Family history of lung cancer   . Carcinoma of upper-outer quadrant of right breast in female, estrogen  receptor positive ([place]) 02/23/2019  . Pre-operative clearance 02/21/2019  . History of breast cancer 02/21/2019  . Medicare annual wellness visit, subsequent 01/05/2019  . Encounter for screening mammogram for breast cancer 01/05/2019  . Knee pain, left 10/14/2018  . Arthritis of left hip 07/29/2018  . Bradycardia 07/01/2018  . Dyspnea 07/01/2018  . Rhinorrhea 01/05/2018  . Chronic diastolic heart failure ([place]) 12/27/2017  . Lumbar spinal stenosis 03/11/2017  . Diastolic dysfunction 82/95/6213  . Estrogen deficiency 12/16/2016  . Routine general medical examination at a health care facility 12/16/2016  . CAD (coronary artery disease) 12/04/2016  . Dyspepsia 03/15/2016  . 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 Eczema 03/06/2015  . Prediabetes 02/11/2014  . Intertrigo labialis 12/07/2012  . CONSTIPATION, CHRONIC 07/03/2010  . Osteoporosis 06/24/2008  . HYPERCHOLESTEROLEMIA 12/08/2006  . Essential hypertension 12/08/2006  . GERD 12/08/2006  . H/O herpes zoster 12/08/2006    Claris Pong 05/16/2020, 12:08 PM  [place], [place], [address] Phone: [phone]   Fax:  [phone]  Name: Sara [name] MRN: [id number] Date of Birth: [birth year] Cheral Almas, PT 05/16/20 12:10 PM

## 2020-05-18 ENCOUNTER — Ambulatory Visit: Payer: Medicare HMO | Admitting: Physical Therapy

## 2020-05-18 ENCOUNTER — Other Ambulatory Visit: Payer: Self-pay

## 2020-05-18 ENCOUNTER — Encounter: Payer: Self-pay | Admitting: Physical Therapy

## 2020-05-18 DIAGNOSIS — C50411 Malignant neoplasm of upper-outer quadrant of right female breast: Secondary | ICD-10-CM

## 2020-05-18 DIAGNOSIS — M6281 Muscle weakness (generalized): Secondary | ICD-10-CM

## 2020-05-18 DIAGNOSIS — M79621 Pain in right upper arm: Secondary | ICD-10-CM | POA: Diagnosis not present

## 2020-05-18 DIAGNOSIS — N63 Unspecified lump in unspecified breast: Secondary | ICD-10-CM | POA: Diagnosis not present

## 2020-05-18 DIAGNOSIS — Z170 Estrogen receptor positive status [ER+]: Secondary | ICD-10-CM

## 2020-05-18 NOTE — Patient Instructions (Signed)
Scapular Retraction (Standing)    Lay on your back. With arms at sides, pinch shoulder blades together. Repeat _10___ times per set. Do __1__ sets per session, holding 3 seconds each. Do __2__ sessions per day.  http://orth.exer.us/945   Copyright  VHI. All rights reserved.

## 2020-05-18 NOTE — Therapy (Signed)
[place], [place], [address] Phone: [phone]   Fax:  [phone]  Physical Therapy Treatment  Patient Details  Name: Sara [name] MRN: [id number] Date of Birth: [birth year] Referring Provider (PT): Dr. Lindi Adie   Encounter Date: 05/18/2020   PT End of Session - 05/18/20 1053    Visit Number 4    Number of Visits 13    PT Start Time 0955    PT Stop Time 1050    PT Time Calculation (min) 55 min    Activity Tolerance Patient tolerated treatment well    Behavior During Therapy [facility] for tasks assessed/performed           Past Medical History:  Diagnosis Date  . Allergy   . CAD (coronary artery disease)   . Chronic diastolic (congestive) heart failure ([place])   . Clinical trial participant    [facility] studies in familial dementia  . Dyspnea 11/28/2016  . Family history of breast cancer   . Family history of lung cancer   . Family history of prostate cancer   . Frequent UTI   . GERD (gastroesophageal reflux disease)   . Hypertension   . Osteoarthritis    hips, back  . Osteopenia   . Pneumonia   . Spinal stenosis   . Stroke [facility]) 1985    Past Surgical History:  Procedure Laterality Date  . ABDOMINAL HYSTERECTOMY     partial has one ovary  . APPENDECTOMY    . BREAST LUMPECTOMY WITH RADIOACTIVE SEED LOCALIZATION Right 04/07/2019   Procedure: RIGHT BREAST LUMPECTOMY WITH RADIOACTIVE SEED LOCALIZATION;  Surgeon: Jovita Kussmaul, MD;  Location: [place];  Service: General;  Laterality: Right;  . CATARACT EXTRACTION Bilateral   . LEFT HEART CATH AND CORONARY ANGIOGRAPHY N/A 11/29/2016   Procedure: Left Heart Cath and Coronary Angiography;  Surgeon: Sherren Mocha, MD;  Location: [place] [facility];  Service: Cardiovascular;  Laterality: N/A;  . LUMBAR DISC SURGERY     x 3  . rotator cuff surgery Right 2010    There were no vitals filed for this visit.   Subjective  Assessment - 05/18/20 1001    Subjective I hurt some under my right arm but most of my pain is in my left shoulder and arm.    Patient Stated Goals Decrease axillary pain , use shoulder better, decrease breast swelling    Currently in Pain? Yes    Pain Score 5     Pain Location Shoulder    Pain Orientation Left    Pain Descriptors / Indicators Aching    Pain Type Chronic pain    Pain Onset More than a month ago    Pain Frequency Intermittent    Aggravating Factors  Reaching up    Pain Relieving Factors Rest                             [facility] Adult PT Treatment/Exercise - 05/18/20 0001      Exercises   Exercises Shoulder      Shoulder Exercises: Supine   Flexion AAROM;Both;10 reps   Verbal cues for proper techniques   Other Supine Exercises Supine scapular retraction, 10x with 3 sec holds      Shoulder Exercises: Pulleys   Flexion 2 minutes    ABduction 2 minutes      Manual Therapy   Soft tissue mobilization To right superior shoulder  with coconut oil    Manual Lymphatic Drainage (MLD) short neck, Left axillary LN, right inguinal LN  anterior interaxillary pathway, right axillo inguinal pathway, and right breast areas of swelling repeating all pathways.    Passive ROM To right shoulder in supine for flex, abd and ER                  PT Education - 05/18/20 1041    Education Details Supine scapular retraction    Person(s) Educated Patient    Methods Explanation;Demonstration;Handout    Comprehension Verbalized understanding;Returned demonstration            PT Short Term Goals - 04/26/20 2100      PT SHORT TERM GOAL #1   Title Pt. will be independant with HEP for Right shoulder ROM/strength    Time 4    Period Weeks    Status New             PT Long Term Goals - 04/26/20 2103      PT LONG TERM GOAL #1   Title Pt. will have right shoulder flex and abd atleast 140 degrees for improved reaching ability    Time 6    Period Weeks     Status New    Target Date 06/07/20      PT LONG TERM GOAL #2   Title pt will have decreased fibrosis at right breast over incision and inferior breast    Time 6    Period Weeks    Status New      PT LONG TERM GOAL #3   Title Pt will report decreased axillary tightness by greater than 50%    Time 6    Period Weeks    Status New    Target Date 06/07/20                 Plan - 05/18/20 1053    Clinical Impression Statement Patient appears to have some improvement in right breast edema and fibrosis by wearing the chip pack. Tissue softened after manual lymph drainage today. Right shoulder ROM is functional and left shoulder ROM is functional but painful. She requested to be D/C'd after next visit and would like to try continuing her HEP on her own after that. PT is in agreement.    PT Frequency 2x / week    PT Duration 6 weeks    PT Treatment/Interventions ADLs/Self Care Home Management;Therapeutic exercise;Therapeutic activities;Neuromuscular re-education;Manual techniques;Patient/family education;Manual lymph drainage;Passive range of motion    PT Next Visit Plan Continue manual therapy; review HEP and D/C    PT Home Exercise Plan Supine scapular retraction    Consulted and Agree with Plan of Care Patient           Patient will benefit from skilled therapeutic intervention in order to improve the following deficits and impairments:  Decreased activity tolerance,Decreased knowledge of precautions,Decreased range of motion,Decreased strength,Increased edema,Postural dysfunction,Impaired UE functional use  Visit Diagnosis: Carcinoma of upper-outer quadrant of right breast in female, estrogen receptor positive ([place])  Axillary pain, right  Muscle weakness (generalized)  Breast swelling     Problem List Patient Active Problem List   Diagnosis Date Noted  . Neck pain 01/18/2020  . Left shoulder pain 01/18/2020  . Pain in both upper arms 01/18/2020  . Thyroid nodule  01/04/2020  . Flank pain 11/22/2019  . Hand pain, right 10/18/2019  . Family history of breast cancer   . Family history of prostate cancer   .  Family history of lung cancer   . Carcinoma of upper-outer quadrant of right breast in female, estrogen receptor positive ([place]) 02/23/2019  . Pre-operative clearance 02/21/2019  . History of breast cancer 02/21/2019  . Medicare annual wellness visit, subsequent 01/05/2019  . Encounter for screening mammogram for breast cancer 01/05/2019  . Knee pain, left 10/14/2018  . Arthritis of left hip 07/29/2018  . Bradycardia 07/01/2018  . Dyspnea 07/01/2018  . Rhinorrhea 01/05/2018  . Chronic diastolic heart failure ([place]) 12/27/2017  . Lumbar spinal stenosis 03/11/2017  . Diastolic dysfunction 51/70/0174  . Estrogen deficiency 12/16/2016  . Routine general medical examination at a health care facility 12/16/2016  . CAD (coronary artery disease) 12/04/2016  . Dyspepsia 03/15/2016  . 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 Eczema 03/06/2015  . Prediabetes 02/11/2014  . Intertrigo labialis 12/07/2012  . CONSTIPATION, CHRONIC 07/03/2010  . Osteoporosis 06/24/2008  . HYPERCHOLESTEROLEMIA 12/08/2006  . Essential hypertension 12/08/2006  . GERD 12/08/2006  . H/O herpes zoster 12/08/2006   Annia Friendly, PT 05/18/20 10:56 AM  [place], [place], [address] Phone: [phone]   Fax:  [phone]  Name: Sara [name] MRN: [id number] Date of Birth: 1931/07/17

## 2020-05-23 ENCOUNTER — Other Ambulatory Visit: Payer: Self-pay

## 2020-05-23 ENCOUNTER — Ambulatory Visit: Payer: Medicare HMO

## 2020-05-23 DIAGNOSIS — M6281 Muscle weakness (generalized): Secondary | ICD-10-CM

## 2020-05-23 DIAGNOSIS — Z170 Estrogen receptor positive status [ER+]: Secondary | ICD-10-CM | POA: Diagnosis not present

## 2020-05-23 DIAGNOSIS — N63 Unspecified lump in unspecified breast: Secondary | ICD-10-CM | POA: Diagnosis not present

## 2020-05-23 DIAGNOSIS — C50411 Malignant neoplasm of upper-outer quadrant of right female breast: Secondary | ICD-10-CM | POA: Diagnosis not present

## 2020-05-23 DIAGNOSIS — M79621 Pain in right upper arm: Secondary | ICD-10-CM | POA: Diagnosis not present

## 2020-05-23 NOTE — Therapy (Addendum)
[place], [place], [address] Phone: [phone]   Fax:  [phone]  Physical Therapy Treatment  Patient Details  Name: Sara [name] MRN: [id number] Date of Birth: [birth year] Referring Provider (PT): Dr. Lindi Adie   Encounter Date: 05/23/2020   PT End of Session - 05/23/20 1224    Visit Number 5    Number of Visits 13    Date for PT Re-Evaluation 06/07/20    PT Start Time 1111    PT Stop Time 1205    PT Time Calculation (min) 54 min    Activity Tolerance Patient tolerated treatment well    Behavior During Therapy [facility] for tasks assessed/performed           Past Medical History:  Diagnosis Date  . Allergy   . CAD (coronary artery disease)   . Chronic diastolic (congestive) heart failure ([place])   . Clinical trial participant    [facility] studies in familial dementia  . Dyspnea 11/28/2016  . Family history of breast cancer   . Family history of lung cancer   . Family history of prostate cancer   . Frequent UTI   . GERD (gastroesophageal reflux disease)   . Hypertension   . Osteoarthritis    hips, back  . Osteopenia   . Pneumonia   . Spinal stenosis   . Stroke [facility]) 1985    Past Surgical History:  Procedure Laterality Date  . ABDOMINAL HYSTERECTOMY     partial has one ovary  . APPENDECTOMY    . BREAST LUMPECTOMY WITH RADIOACTIVE SEED LOCALIZATION Right 04/07/2019   Procedure: RIGHT BREAST LUMPECTOMY WITH RADIOACTIVE SEED LOCALIZATION;  Surgeon: Jovita Kussmaul, MD;  Location: [place];  Service: General;  Laterality: Right;  . CATARACT EXTRACTION Bilateral   . LEFT HEART CATH AND CORONARY ANGIOGRAPHY N/A 11/29/2016   Procedure: Left Heart Cath and Coronary Angiography;  Surgeon: Sherren Mocha, MD;  Location: [facility] [facility];  Service: Cardiovascular;  Laterality: N/A;  . LUMBAR DISC SURGERY     x 3  . rotator cuff surgery Right 2010    There were no vitals filed  for this visit.   Subjective Assessment - 05/23/20 1114    Subjective I can tell my Rt breast has gotten alot better with the swelling.  I am ready to be discharged.   Patient Stated Goals Decrease axillary pain , use shoulder better, decrease breast swelling    Currently in Pain? No/denies              [facility] PT Assessment - 05/23/20 0001      AROM   Right Shoulder Flexion 141 Degrees    Right Shoulder ABduction 143 Degrees                         [facility] Adult PT Treatment/Exercise - 05/23/20 0001      Manual Therapy   Soft tissue mobilization --    Manual Lymphatic Drainage (MLD) short neck, 5 diaphragmatic breaths, Left axillary LN, right inguinal LN  anterior interaxillary pathway, right axillo inguinal pathway, and right breast areas of swelling repeating all pathways instructing pt throughout and having pt return demo    Passive ROM To right shoulder in supine for flex, abd and ER                    PT Short Term Goals - 04/26/20 2100  PT SHORT TERM GOAL #1   Title Pt. will be independant with HEP for Right shoulder ROM/strength    Time 4    Period Weeks    Status New             PT Long Term Goals - 05/23/20 1204      PT LONG TERM GOAL #1   Title Pt. will have right shoulder flex and abd atleast 140 degrees for improved reaching ability    Baseline flex 128 and abd 92 degrees; flex 141 and abd 143 degrees - 05/23/20    Status Achieved      PT LONG TERM GOAL #2   Title pt will have decreased fibrosis at right breast over incision and inferior breast    Baseline Pt reports this much improved since start of care with wear if chip pack and MLD-05/23/20    Status Achieved      PT LONG TERM GOAL #3   Title Pt will report decreased axillary tightness by greater than 50%    Baseline Pt reports 90% improvement noted with end motion axillary tightness-05/23/20    Status Achieved                 Plan - 05/23/20 1224     Clinical Impression Statement Pt comes in reporting ready for D/C. Instructed her in self MLD while performing then having pt return demo for each step. She required mod tactile and VC for lighter pressure and to not slide on skin, but then was able to return good demo after cuing and instruction. Pt has met all goals and is ready to D/C at this time.    Personal Factors and Comorbidities Comorbidity 3+;Age    Comorbidities s/p breast CA, prior CVA, CAD, lumbar sx x 3, Right RTC repai    Examination-Activity Limitations Dressing;Lift;Reach Overhead    Examination-Participation Restrictions Cleaning    Stability/Clinical Decision Making Stable/Uncomplicated    Rehab Potential Good    PT Frequency 2x / week    PT Duration 6 weeks    PT Treatment/Interventions ADLs/Self Care Home Management;Therapeutic exercise;Therapeutic activities;Neuromuscular re-education;Manual techniques;Patient/family education;Manual lymph drainage;Passive range of motion    PT Next Visit Plan D/C this visit    PT Home Exercise Plan Supine scapular retraction, self MLD    Consulted and Agree with Plan of Care Patient           Patient will benefit from skilled therapeutic intervention in order to improve the following deficits and impairments:  Decreased activity tolerance,Decreased knowledge of precautions,Decreased range of motion,Decreased strength,Increased edema,Postural dysfunction,Impaired UE functional use  Visit Diagnosis: Carcinoma of upper-outer quadrant of right breast in female, estrogen receptor positive ([place])  Axillary pain, right  Muscle weakness (generalized)  Breast swelling     Problem List Patient Active Problem List   Diagnosis Date Noted  . Neck pain 01/18/2020  . Left shoulder pain 01/18/2020  . Pain in both upper arms 01/18/2020  . Thyroid nodule 01/04/2020  . Flank pain 11/22/2019  . Hand pain, right 10/18/2019  . Family history of breast cancer   . Family history of prostate  cancer   . Family history of lung cancer   . Carcinoma of upper-outer quadrant of right breast in female, estrogen receptor positive ([place]) 02/23/2019  . Pre-operative clearance 02/21/2019  . History of breast cancer 02/21/2019  . Medicare annual wellness visit, subsequent 01/05/2019  . Encounter for screening mammogram for breast cancer 01/05/2019  . Knee pain, left 10/14/2018  .  Arthritis of left hip 07/29/2018  . Bradycardia 07/01/2018  . Dyspnea 07/01/2018  . Rhinorrhea 01/05/2018  . Chronic diastolic heart failure ([place]) 12/27/2017  . Lumbar spinal stenosis 03/11/2017  . Diastolic dysfunction 99/68/9570  . Estrogen deficiency 12/16/2016  . Routine general medical examination at a health care facility 12/16/2016  . CAD (coronary artery disease) 12/04/2016  . Dyspepsia 03/15/2016  . 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 Eczema 03/06/2015  . Prediabetes 02/11/2014  . Intertrigo labialis 12/07/2012  . CONSTIPATION, CHRONIC 07/03/2010  . Osteoporosis 06/24/2008  . HYPERCHOLESTEROLEMIA 12/08/2006  . Essential hypertension 12/08/2006  . GERD 12/08/2006  . H/O herpes zoster 12/08/2006   PHYSICAL THERAPY DISCHARGE SUMMARY  Visits from Start of Care: 5  Current functional level related to goals / functional outcomes:independent   Remaining deficits:none   Education / Equipment: NA  Plan: Patient agrees to discharge.  Patient goals were met. Patient is being discharged due to meeting the stated rehab goals.  ?????      Otelia Limes, PTA 05/23/2020, 12:29 PM  [place], [place], [address] Phone: [phone]   Fax:  [phone]  Name: PATI THINNES MRN: [id number] Date of Birth: 1932-04-23  Cheral Almas, PT 05/23/20 12:35 PM

## 2020-05-23 NOTE — Patient Instructions (Signed)
Self manual lymph drainage: Perform this sequence once a day.  Only give enough pressure no your skin to make the skin move.  Hug yourself.  Do circles at your neck just above your collarbones.  Repeat this 10 times.  Diaphragmatic - Supine   Inhale through nose making navel move out toward hands. Exhale through puckered lips, hands follow navel in. Repeat _5__ times. Rest _10__ seconds between repeats.   Axilla - One at a Time   Using full weight of flat hand and fingers at center of uninvolved armpit, make _10__ in-place circles.   LEG: Inguinal Nodes Stimulation   With small finger side of hand against hip crease on involved side, gently perform circles at the crease. Repeat __10_ times.   1) Axilla to Inguinal Nodes - Sweep   On involved side, sweep _4__ times from armpit along side of trunk to hip crease.  Now gently stretch skin from the involved side to the uninvolved side across the chest at the shoulder line.  Repeat that 4 times.  Draw an imaginary diagonal line from upper outer breast through the nipple area toward lower inner breast.  Direct fluid upward and inward from this line toward the pathway across your upper chest .  Do this in three rows to treat all of the upper inner breast tissue, and do each row 3-4x.      Direct fluid to treat all of lower outer breast tissue downward and outward toward pathway that is aimed at the left groin.  Finish by doing the pathways as described above going from your involved armpit to the same side groin and going across your upper chest from the involved shoulder to the uninvolved shoulder.  Repeat the steps above where you do circles in your right groin and left armpit.  Cancer Rehab [phone]

## 2020-06-14 ENCOUNTER — Other Ambulatory Visit: Payer: Self-pay | Admitting: Family Medicine

## 2020-06-19 ENCOUNTER — Other Ambulatory Visit: Payer: Self-pay | Admitting: Family Medicine

## 2020-07-19 ENCOUNTER — Ambulatory Visit: Payer: Medicare HMO | Admitting: Orthopaedic Surgery

## 2020-07-19 ENCOUNTER — Encounter: Payer: Self-pay | Admitting: Orthopaedic Surgery

## 2020-07-19 DIAGNOSIS — G8929 Other chronic pain: Secondary | ICD-10-CM | POA: Diagnosis not present

## 2020-07-19 DIAGNOSIS — M25512 Pain in left shoulder: Secondary | ICD-10-CM | POA: Diagnosis not present

## 2020-07-19 MED ORDER — LIDOCAINE HCL 1 % IJ SOLN
3.0000 mL | INTRAMUSCULAR | Status: AC | PRN
  Administered 2020-07-19: 3 mL

## 2020-07-19 MED ORDER — METHYLPREDNISOLONE ACETATE 40 MG/ML IJ SUSP
40.0000 mg | INTRAMUSCULAR | Status: AC | PRN
  Administered 2020-07-19: 40 mg via INTRA_ARTICULAR

## 2020-07-19 NOTE — Progress Notes (Signed)
Office Visit Note   Patient: Sara [name]           Date of Birth: 14-Jul-1931           MRN: [id number] Visit Date: 07/19/2020              Requested by: Tower, Wynelle Fanny, MD [place],  [place] [address] PCP: Abner Greenspan, MD   Assessment & Plan: Visit Diagnoses:  1. Chronic left shoulder pain     Plan: I do feel that it is worth trying a steroid injection in her left shoulder today and her and her daughter agreed to this. I explained the risk and benefits of injections and she tolerated it well. She did feel somewhat better when we were done. All questions and concerns were answered and addressed. Follow-up can be as needed.  Follow-Up Instructions: Return if symptoms worsen or fail to improve.   Orders:  Orders Placed This Encounter  Procedures  . Large Joint Inj   No orders of the defined types were placed in this encounter.     Procedures: Large Joint Inj: L subacromial bursa on 07/19/2020 12:19 PM Indications: pain and diagnostic evaluation Details: 22 G 1.5 in needle  Arthrogram: No  Medications: 3 mL lidocaine 1 %; 40 mg methylPREDNISolone acetate 40 MG/ML Outcome: tolerated well, no immediate complications Procedure, treatment alternatives, risks and benefits explained, specific risks discussed. Consent was given by the patient. Immediately prior to procedure a time out was called to verify the correct patient, procedure, equipment, support staff and site/side marked as required. Patient was prepped and draped in the usual sterile fashion.       Clinical Data: No additional findings.   Subjective: Chief Complaint  Patient presents with  . Left Shoulder - Pain  The patient comes in today with continued left shoulder pain. I actually saw her in August of last year with neck and shoulder pain. We have x-rays of both of those areas. She has chronic numbness in her hands. She is 87 years old and does have significant arthritis in  her neck. Most of her pain today is around the left shoulder joint itself. Her daughter is with her as well today. She denies any acute injuries to the shoulder but it does hurt with lifting overhead and behind her on the left side.  HPI  Review of Systems She denies any acute change in her medical status. There is no headache, chest pain, shortness of breath, fever, chills, nausea, vomiting  Objective: Vital Signs: There were no vitals taken for this visit.  Physical Exam She is alert and orient x3 and in no acute distress Ortho Exam Examination of her left shoulder shows it is clinically well located. It does hurt throughout its arc of motion with positive Neer and Hawkins signs. There is limited internal rotation with adduction. Specialty Comments:  No specialty comments available.  Imaging: No results found.   PMFS History: Patient Active Problem List   Diagnosis Date Noted  . Neck pain 01/18/2020  . Left shoulder pain 01/18/2020  . Pain in both upper arms 01/18/2020  . Thyroid nodule 01/04/2020  . Flank pain 11/22/2019  . Hand pain, right 10/18/2019  . Family history of breast cancer   . Family history of prostate cancer   . Family history of lung cancer   . Carcinoma of upper-outer quadrant of right breast in female, estrogen receptor positive ([place]) 02/23/2019  . Pre-operative clearance 02/21/2019  .  History of breast cancer 02/21/2019  . Medicare annual wellness visit, subsequent 01/05/2019  . Encounter for screening mammogram for breast cancer 01/05/2019  . Knee pain, left 10/14/2018  . Arthritis of left hip 07/29/2018  . Bradycardia 07/01/2018  . Dyspnea 07/01/2018  . Rhinorrhea 01/05/2018  . Chronic diastolic heart failure ([place]) 12/27/2017  . Lumbar spinal stenosis 03/11/2017  . Diastolic dysfunction 46/27/0350  . Estrogen deficiency 12/16/2016  . Routine general medical examination at a health care facility 12/16/2016  . CAD (coronary artery disease)  12/04/2016  . Dyspepsia 03/15/2016  . 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 Eczema 03/06/2015  . Prediabetes 02/11/2014  . Intertrigo labialis 12/07/2012  . CONSTIPATION, CHRONIC 07/03/2010  . Osteoporosis 06/24/2008  . HYPERCHOLESTEROLEMIA 12/08/2006  . Essential hypertension 12/08/2006  . GERD 12/08/2006  . H/O herpes zoster 12/08/2006   Past Medical History:  Diagnosis Date  . Allergy   . CAD (coronary artery disease)   . Chronic diastolic (congestive) heart failure ([place])   . Clinical trial participant    [facility] studies in familial dementia  . Dyspnea 11/28/2016  . Family history of breast cancer   . Family history of lung cancer   . Family history of prostate cancer   . Frequent UTI   . GERD (gastroesophageal reflux disease)   . Hypertension   . Osteoarthritis    hips, back  . Osteopenia   . Pneumonia   . Spinal stenosis   . Stroke [facility]) 1985    Family History  Problem Relation Age of Onset  . Lung cancer Brother 41  . Other Mother        kidney tumor  . Hypertension Mother   . Diabetes Sister   . Diabetes Brother   . Diabetes Sister        x 2  . Colon cancer Maternal Uncle   . Other Sister        intestine burst  . Breast cancer Maternal Aunt        x 2  . Breast cancer Daughter        unknown cancer  . Diabetes Son        x 3  . Diabetes Daughter   . Breast cancer Sister        x2, diagnosed in 74s and 28s  . Prostate cancer Maternal Uncle   . Esophageal cancer Neg Hx   . Rectal cancer Neg Hx   . Stomach cancer Neg Hx     Past Surgical History:  Procedure Laterality Date  . ABDOMINAL HYSTERECTOMY     partial has one ovary  . APPENDECTOMY    . BREAST LUMPECTOMY WITH RADIOACTIVE SEED LOCALIZATION Right 04/07/2019   Procedure: RIGHT BREAST LUMPECTOMY WITH RADIOACTIVE SEED LOCALIZATION;  Surgeon: Jovita Kussmaul, MD;  Location: [facility];  Service: General;  Laterality: Right;  . CATARACT EXTRACTION Bilateral   . LEFT HEART CATH AND CORONARY  ANGIOGRAPHY N/A 11/29/2016   Procedure: Left Heart Cath and Coronary Angiography;  Surgeon: Sherren Mocha, MD;  Location: [facility];  Service: Cardiovascular;  Laterality: N/A;  . LUMBAR DISC SURGERY     x 3  . rotator cuff surgery Right 2010   Social History   Occupational History  . Occupation: RETIRED    Employer: RETIRED  Tobacco Use  . Smoking status: Never Smoker  . Smokeless tobacco: Never Used  Vaping Use  . Vaping Use: Never used  Substance and Sexual Activity  . Alcohol  use: No    Alcohol/week: 0.0 standard drinks  . Drug use: No  . Sexual activity: Not on file

## 2020-08-10 ENCOUNTER — Other Ambulatory Visit: Payer: Self-pay | Admitting: Family Medicine

## 2020-08-18 DIAGNOSIS — Z170 Estrogen receptor positive status [ER+]: Secondary | ICD-10-CM | POA: Diagnosis not present

## 2020-08-18 DIAGNOSIS — C50411 Malignant neoplasm of upper-outer quadrant of right female breast: Secondary | ICD-10-CM | POA: Diagnosis not present

## 2020-08-21 IMAGING — DX DG HAND COMPLETE 3+V*R*
3 series · 3 of 3 positions shown · non-contrast
Comparison: None.

CLINICAL DATA: Pain at the base of the thumb

EXAM:
RIGHT HAND - COMPLETE 3+ VIEW

[hand ap]
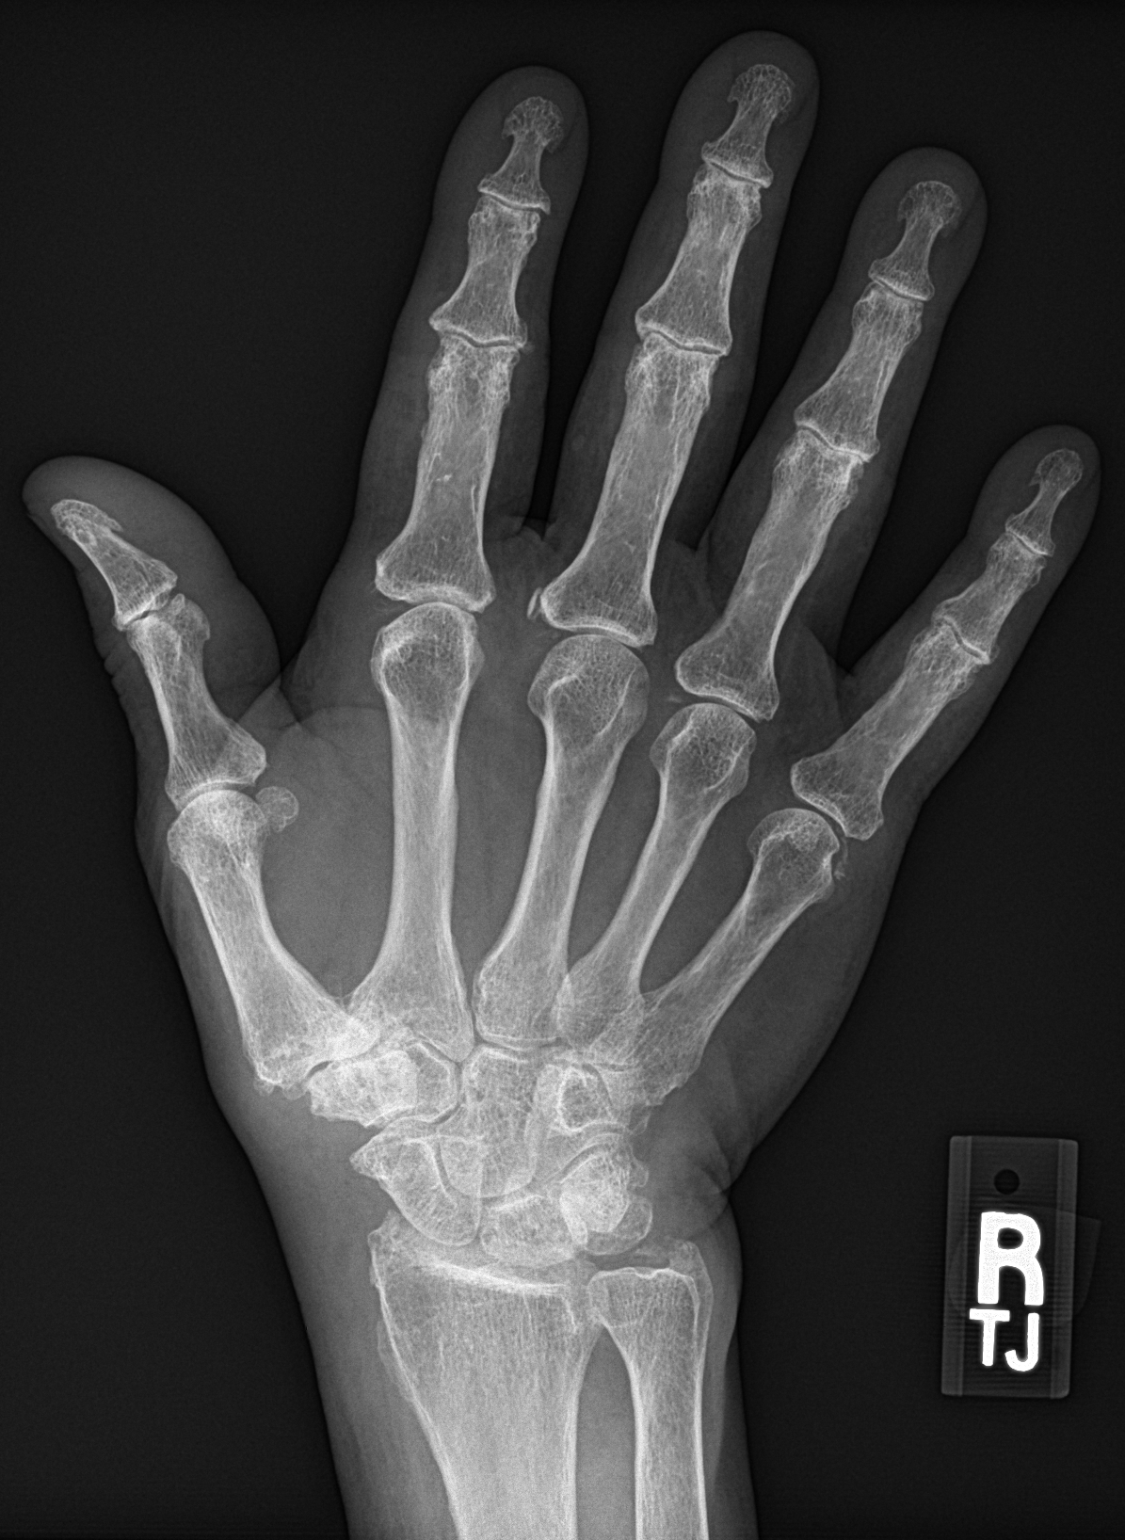

[hand obl]
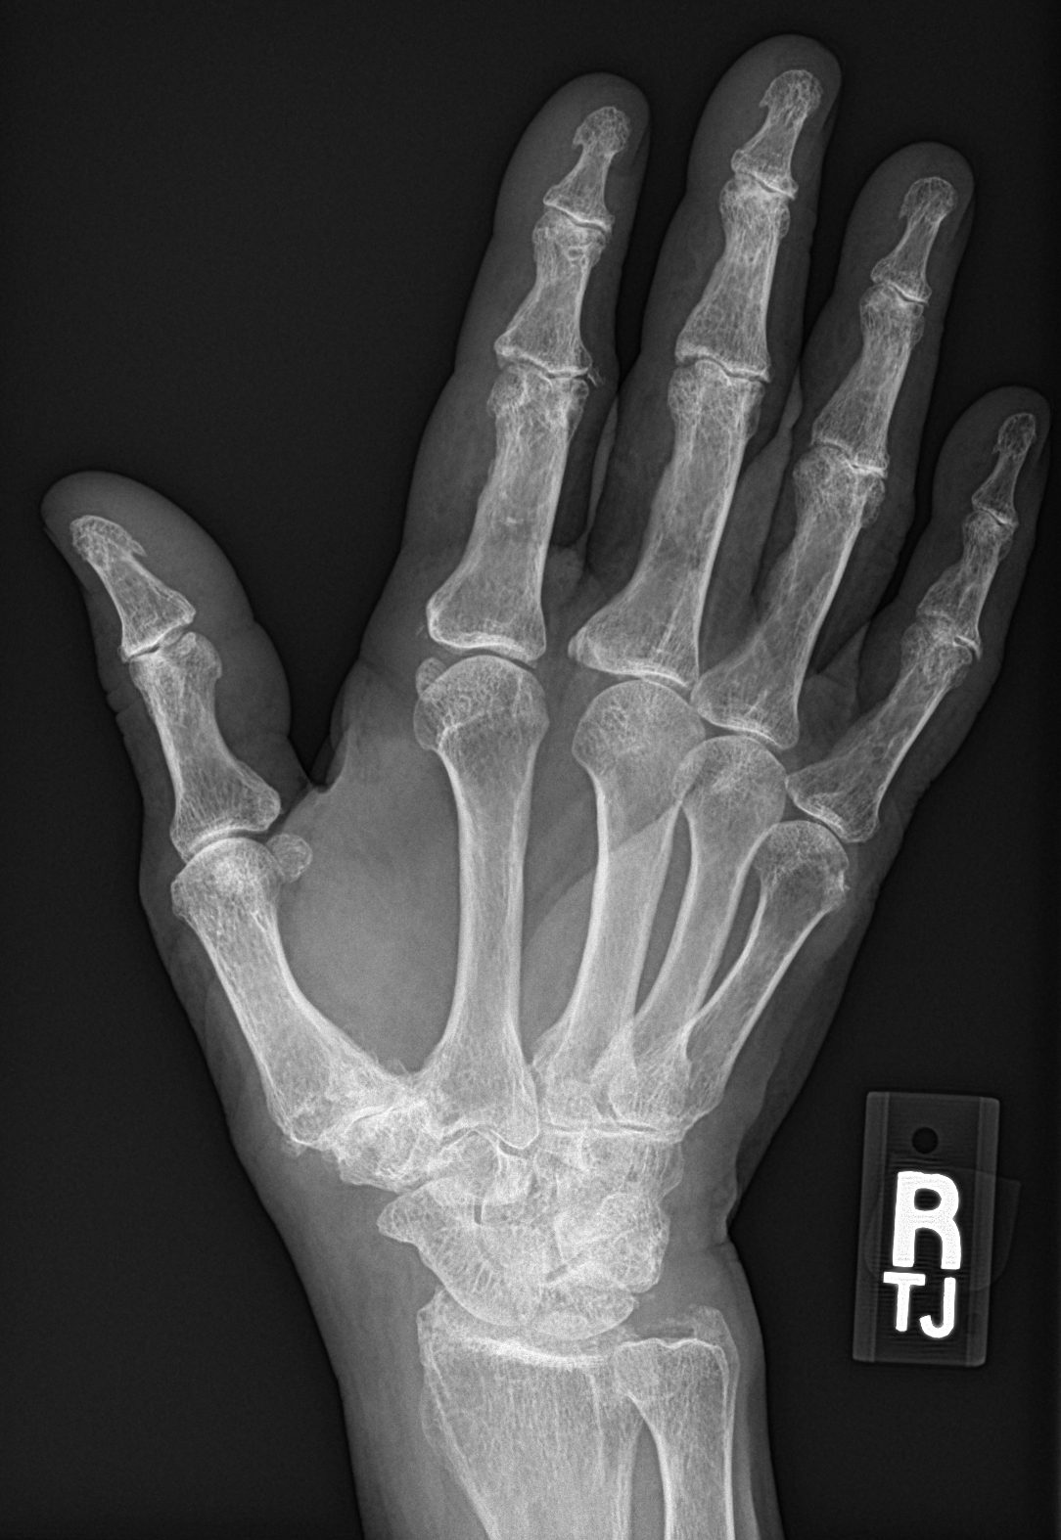

[hand lat]
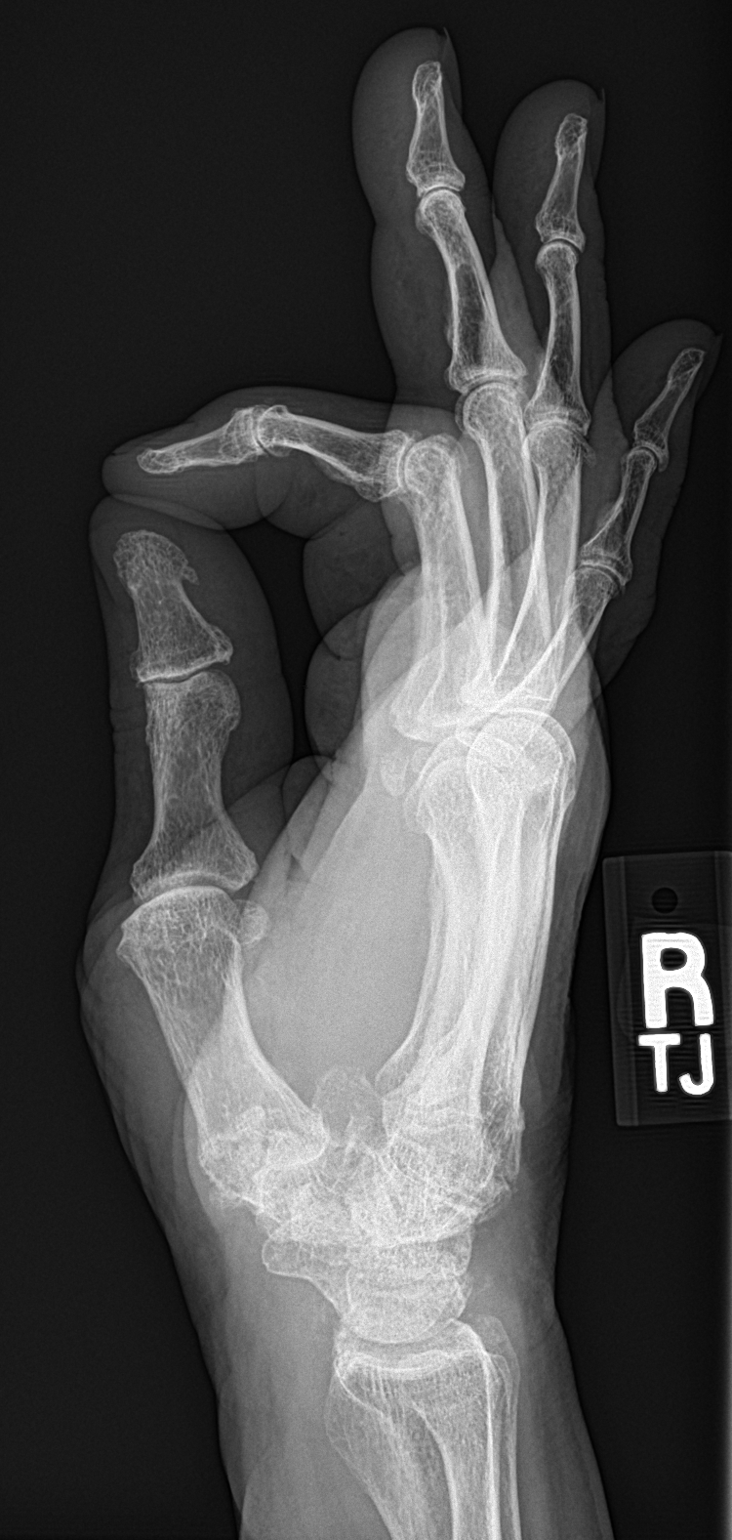

[3 of 3 positions shown; findings below may reference images not displayed]

FINDINGS: No fracture or malalignment. Advanced arthritis at the first CMC
joint. Joint space narrowing at the D IP and PIP joints.
IMPRESSION: Arthritis of the digits, including advanced arthritis at the first
CMC joint.

## 2020-09-19 IMAGING — CR DG CHEST 2V
2 series · 2 of 2 positions shown · non-contrast
Comparison: November 28, 2016

CLINICAL DATA: Shortness of breath, dizziness and chest tightness.

EXAM:
CHEST - 2 VIEW

[w chest pa]
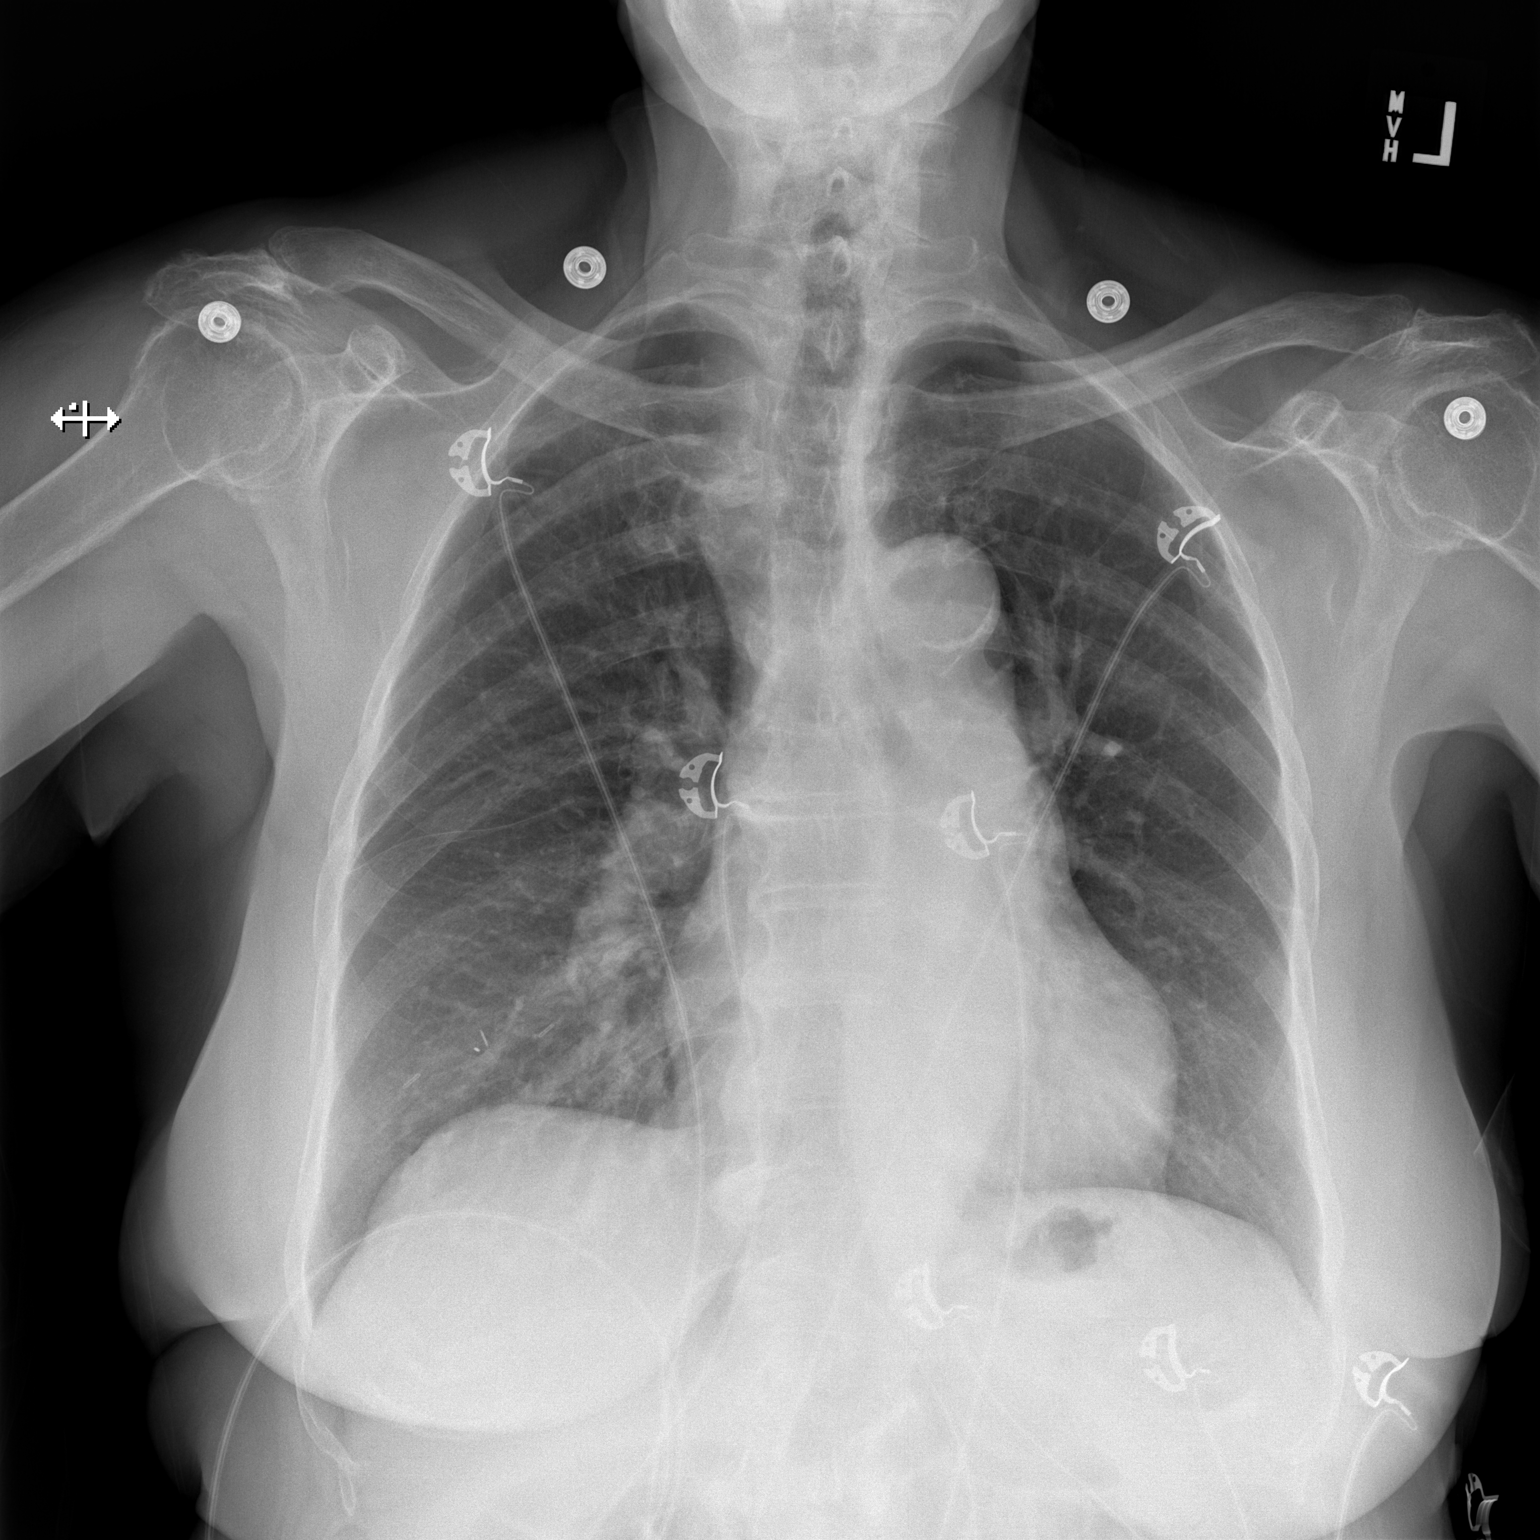

[w chest lat]
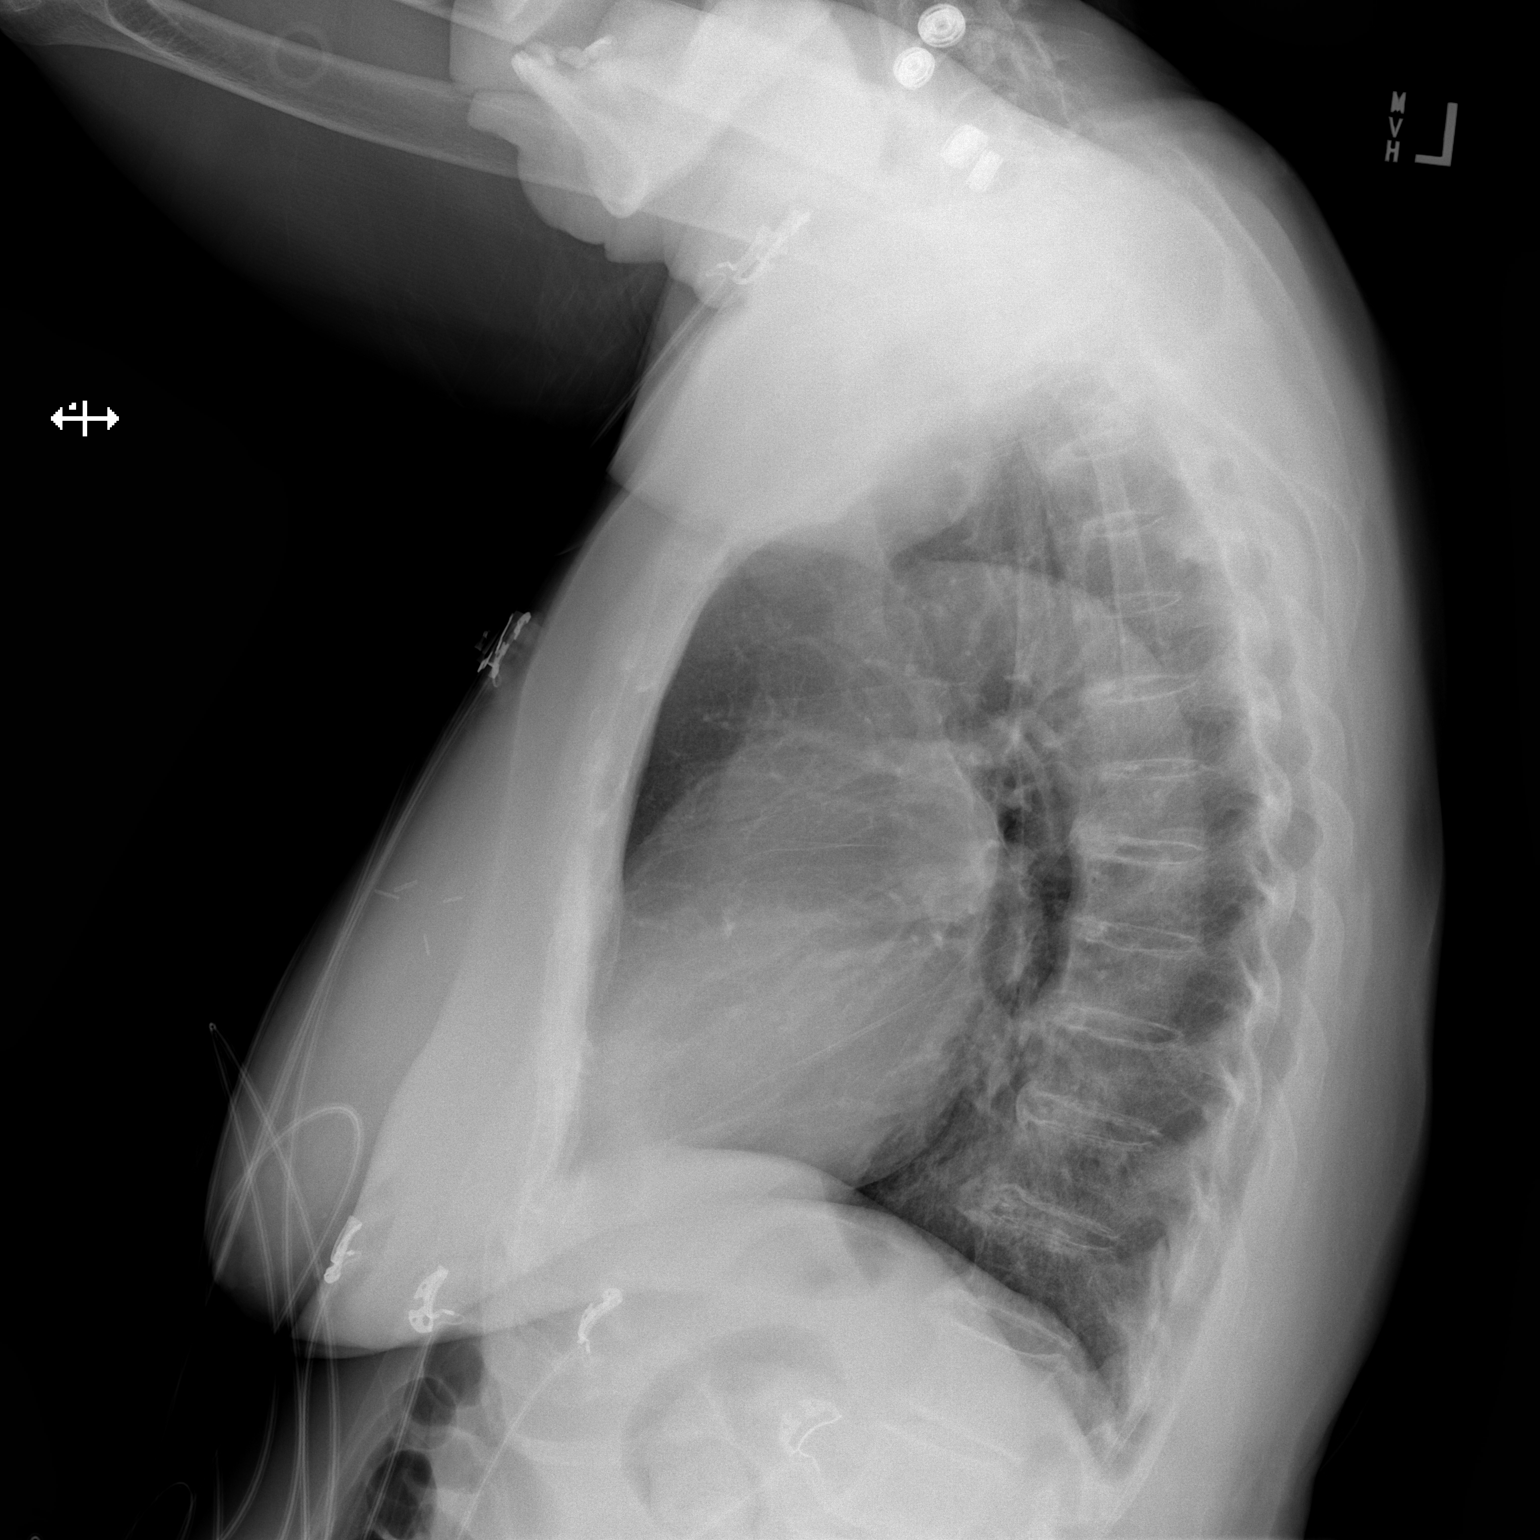

[2 of 2 positions shown; findings below may reference images not displayed]

FINDINGS: There is no evidence of acute infiltrate, pleural effusion or
pneumothorax. Small radiopaque surgical clips are seen overlying the
right lung base. The heart size and mediastinal contours are within
normal limits. There is moderate severity calcification of the
aortic arch. The visualized skeletal structures are unremarkable.
IMPRESSION: No active cardiopulmonary disease.

## 2020-12-07 ENCOUNTER — Other Ambulatory Visit: Payer: Self-pay | Admitting: Family Medicine

## 2020-12-08 ENCOUNTER — Telehealth: Payer: Self-pay | Admitting: *Deleted

## 2020-12-08 NOTE — Chronic Care Management (AMB) (Signed)
  Chronic Care Management   Note  12/08/2020 Name: Sara [name] MRN: [id number] DOB: Feb 04, 1932  Sara [name] is a 87 y.o. year old female who is a primary care patient of Tower, Wynelle Fanny, MD. I reached out to Renato Shin by phone today in response to a referral sent by Sara [name]'s PCP Tower, Wynelle Fanny, MD     Sara [name] was given information about Chronic Care Management services today including:  CCM service includes personalized support from designated clinical staff supervised by her physician, including individualized plan of care and coordination with other care providers 24/7 contact phone numbers for assistance for urgent and routine care needs. Service will only be billed when office clinical staff spend 20 minutes or more in a month to coordinate care. Only one practitioner may furnish and bill the service in a calendar month. The patient may stop CCM services at any time (effective at the end of the month) by phone call to the office staff. The patient will be responsible for cost sharing (co-pay) of up to 20% of the service fee (after annual deductible is met).  Patient agreed to services and verbal consent obtained.   Follow up plan: Telephone appointment with care management team member scheduled for:12/15/2020  Sara [name], [facility]  Direct Dial: [phone]

## 2020-12-12 NOTE — Telephone Encounter (Signed)
Mrs. Koone called in and stated that [facility] has tried to call and get a new prescription for her medication. She stated that they gave her a number to call which is [phone]

## 2020-12-12 NOTE — Telephone Encounter (Signed)
Med refilled once, will route to support pool because pt is over due for a f/u or a AWV/CPE part 2 (pt's choice), please call and schedule appt

## 2020-12-13 NOTE — Telephone Encounter (Signed)
Patient is scheduled   

## 2020-12-15 ENCOUNTER — Telehealth: Payer: Medicare HMO

## 2020-12-22 ENCOUNTER — Ambulatory Visit (INDEPENDENT_AMBULATORY_CARE_PROVIDER_SITE_OTHER): Payer: Medicare HMO

## 2020-12-22 DIAGNOSIS — I10 Essential (primary) hypertension: Secondary | ICD-10-CM | POA: Diagnosis not present

## 2020-12-22 DIAGNOSIS — I5032 Chronic diastolic (congestive) heart failure: Secondary | ICD-10-CM

## 2020-12-22 DIAGNOSIS — I2510 Atherosclerotic heart disease of native coronary artery without angina pectoris: Secondary | ICD-10-CM | POA: Diagnosis not present

## 2020-12-22 NOTE — Patient Instructions (Addendum)
Visit Information:  Thank you for taking the time to speak with me today  PATIENT GOALS:   Goals Addressed             This Visit's Progress    Track and Manage Symptoms-Heart Failure and hypertension   On track    Timeframe:  Long-Range Goal Priority:  Medium Start Date:    12/22/2020                         Expected End Date:     03/09/2021                  Follow Up Date 01/25/2021   - Report symptoms to your doctor immediately:  gaining 3 pounds in one day or 5 pounds in one week, shortness of breath, swelling in feet, ankles, legs or your waistband gets tight.  - follow a low salt diet plan - watch for swelling in feet, ankles and legs every day - weigh daily and check your blood pressure at least 1 -2 times per week.  Record your readings for your weight and blood pressure.  - Call your doctor for new or ongoing symptoms.  -Take your medications as prescribed and refill them timely. -Keep follow up visits with your doctors as recommended.  - Review education information sent to you in the mail on Managing your Hypertension (High Blood pressure), Heart failure management and Heart failure action plan.  Contact your RN case manager if you have questions.    Why is this important?   You will be able to handle your symptoms better if you keep track of them.  Making some simple changes to your lifestyle will help.  Eating healthy is one thing you can do to take good care of yourself.            Consent to CCM Services: Ms. Luellen was given information about Chronic Care Management services today including:  CCM service includes personalized support from designated clinical staff supervised by her physician, including individualized plan of care and coordination with other care providers 24/7 contact phone numbers for assistance for urgent and routine care needs. Service will only be billed when office clinical staff spend 20 minutes or more in a month to coordinate  care. Only one practitioner may furnish and bill the service in a calendar month. The patient may stop CCM services at any time (effective at the end of the month) by phone call to the office staff. The patient will be responsible for cost sharing (co-pay) of up to 20% of the service fee (after annual deductible is met).  Patient agreed to services and verbal consent obtained.   The patient verbalized understanding of instructions, educational materials, and care plan provided today and agreed to receive a mailed copy of patient instructions, educational materials, and care plan.   The patient has been provided with contact information for the care management team and has been advised to call with any health related questions or concerns.  The care management team will reach out to the patient again over the next 45 days.   Quinn Plowman RN,BSN,CCM RN Case Manager [facility]  [phone]  Patient Care Plan: Cardiovascular   Problem Identified: Disease Progression (Heart Failure and hypertension)   Priority: Medium   Long-Range Goal: Health Optimized for Heart failure and hypertension   Start Date: 12/22/2020  Expected End Date: 03/09/2021  This Visit's Progress: On track  Priority:  Medium  Current Barriers: Knowledge deficit related to long term care plan for self management of HF and hypertension:  87 year old patient with history of HTN, HF, CAD. Patient states she is not checking her blood pressure. Patient states she does not know a lot about her heart failure diagnosis.  She states she is able to afford her medications and takes them as prescribed. Patient confirms her next follow up with her cardiologist is on 03/19/2021.  Case Manager Clinical Goal(s):  patient will verbalize understanding of Heart Failure Action Plan and when to call doctor patient will take all Heart Failure mediations as prescribed patient will weigh daily and record (notifying MD of 3 lb weight gain  over night or 5 lb in a week) Interventions:  Collaboration with Tower, Wynelle Fanny, MD regarding development and update of comprehensive plan of care as evidenced by provider attestation and co-signature Inter-disciplinary care team collaboration (see longitudinal plan of care) Provided verbal education on low sodium diet Reviewed Heart Failure Action Plan in depth and provided written copy Assessed need for readable accurate scales in home Discussed importance of daily weight and advised patient to weigh and record daily Reviewed role of diuretics in prevention of fluid overload and management of heart failure Patient Goals: - Report symptoms to your doctor immediately:  gaining 3 pounds in one day or 5 pounds in one week, shortness of breath, swelling in feet, ankles, legs or your waistband gets tight.  - follow a low salt diet plan - watch for swelling in feet, ankles and legs every day - weigh daily and check your blood pressure at least 1 -2 times per week.  Record your readings for your weight and blood pressure.  - Call your doctor for new or ongoing symptoms.  -Take your medications as prescribed and refill them timely. -Keep follow up visits with your doctors as recommended.  - Review education information sent to you in the mail on Managing your Hypertension (High Blood pressure), Heart failure management and Heart failure action plan.  Contact your RN case manager if you have questions.  Follow Up Plan: The patient has been provided with contact information for the care management team and has been advised to call with any health related questions or concerns.  The care management team will reach out to the patient again over the next 45 days.

## 2020-12-24 NOTE — Chronic Care Management (AMB) (Signed)
Chronic Care Management   CCM RN Visit Note  12/24/2020 Late entry for 12/22/2020 Name: Sara [name] MRN: [id number] DOB: June 02, 1932  Subjective: Sara [name] is a 87 y.o. year old female who is a primary care patient of Tower, Wynelle Fanny, MD. The care management team was consulted for assistance with disease management and care coordination needs.    Engaged with patient by telephone for initial visit in response to provider referral for case management and/or care coordination services.   Consent to Services:  The patient was given the following information about Chronic Care Management services today, agreed to services, and gave verbal consent: 1. CCM service includes personalized support from designated clinical staff supervised by the primary care provider, including individualized plan of care and coordination with other care providers 2. 24/7 contact phone numbers for assistance for urgent and routine care needs. 3. Service will only be billed when office clinical staff spend 20 minutes or more in a month to coordinate care. 4. Only one practitioner may furnish and bill the service in a calendar month. 5.The patient may stop CCM services at any time (effective at the end of the month) by phone call to the office staff. 6. The patient will be responsible for cost sharing (co-pay) of up to 20% of the service fee (after annual deductible is met). Patient agreed to services and consent obtained.  Patient agreed to services and verbal consent obtained.   Assessment: Review of patient past medical history, allergies, medications, health status, including review of consultants reports, laboratory and other test data, was performed as part of comprehensive evaluation and provision of chronic care management services.   SDOH (Social Determinants of Health) assessments and interventions performed:  SDOH Interventions    Flowsheet Row Most Recent Value  SDOH Interventions   Food  Insecurity Interventions Intervention Not Indicated  Housing Interventions Intervention Not Indicated  Transportation Interventions Intervention Not Indicated        CCM Care Plan  Allergies  Allergen Reactions   Amoxicillin     Reaction not known   Atorvastatin     aching   Cefpodoxime Proxetil     Reaction not known   Clarithromycin     Reaction not known   Fosamax [Alendronate Sodium]     Body pain    Furosemide     REACTION: doesn't help   Gabapentin Swelling    Caused severe swellling    Outpatient Encounter Medications as of 12/22/2020  Medication Sig   acetaminophen (TYLENOL) 500 MG tablet Take 1,000 mg by mouth every 6 (six) hours as needed.   anastrozole (ARIMIDEX) 1 MG tablet Take 1 tablet (1 mg total) by mouth daily.   aspirin EC 81 MG tablet Take 81 mg by mouth daily.     Cholecalciferol (VITAMIN D-3) 1000 UNITS CAPS Take 1,000 Units by mouth daily.    diltiazem (CARDIZEM CD) 120 MG 24 hr capsule TAKE 1 CAPSULE EVERY DAY   Docusate Sodium (EQ STOOL SOFTENER PO) Take 100 mg by mouth daily as needed (constipation).    doxylamine, Sleep, (UNISOM) 25 MG tablet Take 25 mg by mouth at bedtime as needed for sleep.    hydrochlorothiazide (HYDRODIURIL) 25 MG tablet TAKE 1 TABLET EVERY DAY   nitroGLYCERIN (NITROSTAT) 0.4 MG SL tablet Place 1 tablet (0.4 mg total) under the tongue every 5 (five) minutes as needed for chest pain (max 3 doses in 15 minutes).   polyethylene glycol (MIRALAX / GLYCOLAX) packet Take 17 g  by mouth daily.    potassium chloride (KLOR-CON) 10 MEQ tablet TAKE 1 TABLET EVERY DAY   vitamin B-12 (CYANOCOBALAMIN) 1000 MCG tablet Take 1,000 mcg by mouth daily.   methylPREDNISolone (MEDROL) 4 MG tablet Medrol dose pack. Take as instructed (Patient not taking: No sig reported)   traMADol (ULTRAM) 50 MG tablet Take 1 tablet (50 mg total) by mouth every 6 (six) hours as needed. (Patient not taking: No sig reported)   No facility-administered encounter  medications on file as of 12/22/2020.    Patient Active Problem List   Diagnosis Date Noted   Neck pain 01/18/2020   Left shoulder pain 01/18/2020   Pain in both upper arms 01/18/2020   Thyroid nodule 01/04/2020   Flank pain 11/22/2019   Hand pain, right 10/18/2019   Family history of breast cancer    Family history of prostate cancer    Family history of lung cancer    Carcinoma of upper-outer quadrant of right breast in female, estrogen receptor positive ([facility]) 02/23/2019   Pre-operative clearance 02/21/2019   History of breast cancer 02/21/2019   Medicare annual wellness visit, subsequent 01/05/2019   Encounter for screening mammogram for breast cancer 01/05/2019   Knee pain, left 10/14/2018   Arthritis of left hip 07/29/2018   Bradycardia 07/01/2018   Dyspnea 07/01/2018   Rhinorrhea 01/05/2018   Chronic diastolic heart failure ([facility]) 12/27/2017   Lumbar spinal stenosis 03/11/2017   Diastolic dysfunction 01/14/2017   Estrogen deficiency 12/16/2016   Routine general medical examination at a health care facility 12/16/2016   CAD (coronary artery disease) 12/04/2016   Dyspepsia 03/15/2016   Mucocele, 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   Eczema 03/06/2015   Prediabetes 02/11/2014   Intertrigo labialis 12/07/2012   CONSTIPATION, CHRONIC 07/03/2010   Osteoporosis 06/24/2008   HYPERCHOLESTEROLEMIA 12/08/2006   Essential hypertension 12/08/2006   GERD 12/08/2006   H/O herpes zoster 12/08/2006    Conditions to be addressed/monitored:CHF and HTN  Care Plan : Cardiovascular  Updates made by ,  E, RN since 12/24/2020 12:00 AM   Problem: Disease Progression (Heart Failure and hypertension)   Priority: Medium   Long-Range Goal: Health Optimized for Heart failure and hypertension   Start Date: 12/22/2020  Expected End Date: 03/09/2021  This Visit's Progress: On track  Priority: Medium  Current Barriers: Knowledge deficit related to long term care plan for self management of HF and  hypertension:  88 year old patient with history of HTN, HF, CAD. Patient states she is not checking her blood pressure. Patient states she does not know a lot about her heart failure diagnosis.  She states she is able to afford her medications and takes them as prescribed. Patient confirms her next follow up with her cardiologist is on 03/19/2021.  Case Manager Clinical Goal(s):  patient will verbalize understanding of Heart Failure Action Plan and when to call doctor patient will take all Heart Failure mediations as prescribed patient will weigh daily and record (notifying MD of 3 lb weight gain over night or 5 lb in a week) Interventions:  Collaboration with Tower, Marne A, MD regarding development and update of comprehensive plan of care as evidenced by provider attestation and co-signature Inter-disciplinary care team collaboration (see longitudinal plan of care) Provided verbal education on low sodium diet Reviewed Heart Failure Action Plan in depth and provided written copy Assessed need for readable accurate scales in home Discussed importance of daily weight and advised patient to weigh and record daily Reviewed role of diuretics in prevention   of fluid overload and management of heart failure Patient Goals: - Report symptoms to your doctor immediately:  gaining 3 pounds in one day or 5 pounds in one week, shortness of breath, swelling in feet, ankles, legs or your waistband gets tight.  - follow a low salt diet plan - watch for swelling in feet, ankles and legs every day - weigh daily and check your blood pressure at least 1 -2 times per week.  Record your readings for your weight and blood pressure.  - Call your doctor for new or ongoing symptoms.  -Take your medications as prescribed and refill them timely. -Keep follow up visits with your doctors as recommended.  - Review education information sent to you in the mail on Managing your Hypertension (High Blood pressure), Heart failure  management and Heart failure action plan.  Contact your RN case manager if you have questions.  Follow Up Plan: The patient has been provided with contact information for the care management team and has been advised to call with any health related questions or concerns.  The care management team will reach out to the patient again over the next 45 days.        Plan:The patient has been provided with contact information for the care management team and has been advised to call with any health related questions or concerns.  and The care management team will reach out to the patient again over the next 45 days.   RN,BSN,CCM RN Case Manager [place]  [phone]          

## 2021-01-09 ENCOUNTER — Ambulatory Visit (INDEPENDENT_AMBULATORY_CARE_PROVIDER_SITE_OTHER): Payer: Medicare HMO | Admitting: Family Medicine

## 2021-01-09 ENCOUNTER — Other Ambulatory Visit: Payer: Self-pay

## 2021-01-09 ENCOUNTER — Encounter: Payer: Self-pay | Admitting: Family Medicine

## 2021-01-09 VITALS — BP 126/72 | HR 62 | Temp 98.3°F | Ht 61.5 in | Wt 146.4 lb

## 2021-01-09 DIAGNOSIS — Z853 Personal history of malignant neoplasm of breast: Secondary | ICD-10-CM

## 2021-01-09 DIAGNOSIS — R7303 Prediabetes: Secondary | ICD-10-CM | POA: Diagnosis not present

## 2021-01-09 DIAGNOSIS — E2839 Other primary ovarian failure: Secondary | ICD-10-CM

## 2021-01-09 DIAGNOSIS — Z0000 Encounter for general adult medical examination without abnormal findings: Secondary | ICD-10-CM

## 2021-01-09 DIAGNOSIS — I7789 Other specified disorders of arteries and arterioles: Secondary | ICD-10-CM

## 2021-01-09 DIAGNOSIS — E7800 Pure hypercholesterolemia, unspecified: Secondary | ICD-10-CM

## 2021-01-09 DIAGNOSIS — I5032 Chronic diastolic (congestive) heart failure: Secondary | ICD-10-CM

## 2021-01-09 DIAGNOSIS — M810 Age-related osteoporosis without current pathological fracture: Secondary | ICD-10-CM

## 2021-01-09 DIAGNOSIS — I10 Essential (primary) hypertension: Secondary | ICD-10-CM

## 2021-01-09 LAB — CBC WITH DIFFERENTIAL/PLATELET
Basophils Absolute: 0.1 10*3/uL (ref 0.0–0.1)
Basophils Relative: 0.9 % (ref 0.0–3.0)
Eosinophils Absolute: 0.2 10*3/uL (ref 0.0–0.7)
Eosinophils Relative: 3.1 % (ref 0.0–5.0)
HCT: 42.3 % (ref 36.0–46.0)
Hemoglobin: 13.8 g/dL (ref 12.0–15.0)
Lymphocytes Relative: 31.1 % (ref 12.0–46.0)
Lymphs Abs: 1.9 10*3/uL (ref 0.7–4.0)
MCHC: 32.6 g/dL (ref 30.0–36.0)
MCV: 87.3 fl (ref 78.0–100.0)
Monocytes Absolute: 0.7 10*3/uL (ref 0.1–1.0)
Monocytes Relative: 11.4 % (ref 3.0–12.0)
Neutro Abs: 3.3 10*3/uL (ref 1.4–7.7)
Neutrophils Relative %: 53.5 % (ref 43.0–77.0)
Platelets: 190 10*3/uL (ref 150.0–400.0)
RBC: 4.85 Mil/uL (ref 3.87–5.11)
RDW: 13.2 % (ref 11.5–15.5)
WBC: 6.2 10*3/uL (ref 4.0–10.5)

## 2021-01-09 LAB — COMPREHENSIVE METABOLIC PANEL
ALT: 11 U/L (ref 0–35)
AST: 17 U/L (ref 0–37)
Albumin: 4.5 g/dL (ref 3.5–5.2)
Alkaline Phosphatase: 103 U/L (ref 39–117)
BUN: 15 mg/dL (ref 6–23)
CO2: 31 mEq/L (ref 19–32)
Calcium: 10.3 mg/dL (ref 8.4–10.5)
Chloride: 101 mEq/L (ref 96–112)
Creatinine, Ser: 0.91 mg/dL (ref 0.40–1.20)
GFR: 57.05 mL/min — ABNORMAL LOW (ref >60.00)
Glucose, Bld: 98 mg/dL (ref 70–99)
Potassium: 3.9 mEq/L (ref 3.5–5.1)
Sodium: 141 mEq/L (ref 135–145)
Total Bilirubin: 0.7 mg/dL (ref 0.2–1.2)
Total Protein: 7.4 g/dL (ref 6.0–8.3)

## 2021-01-09 LAB — LIPID PANEL
Cholesterol: 192 mg/dL (ref 0–200)
HDL: 64.6 mg/dL (ref >39.00)
LDL Cholesterol: 99 mg/dL (ref 0–99)
NonHDL: 127.15
Total CHOL/HDL Ratio: 3
Triglycerides: 143 mg/dL (ref 0.0–149.0)
VLDL: 28.6 mg/dL (ref 0.0–40.0)

## 2021-01-09 LAB — TSH: TSH: 1.76 u[IU]/mL (ref 0.35–5.50)

## 2021-01-09 LAB — HEMOGLOBIN A1C: Hgb A1c MFr Bld: 6.3 % (ref 4.6–6.5)

## 2021-01-09 LAB — VITAMIN D 25 HYDROXY (VIT D DEFICIENCY, FRACTURES): VITD: 52.1 ng/mL (ref 30.00–100.00)

## 2021-01-09 MED ORDER — POTASSIUM CHLORIDE ER 10 MEQ PO TBCR: 10.0000 meq | EXTENDED_RELEASE_TABLET | Freq: Every day | ORAL | 3 refills | Status: DC

## 2021-01-09 MED ORDER — HYDROCHLOROTHIAZIDE 25 MG PO TABS: 25.0000 mg | ORAL_TABLET | Freq: Every day | ORAL | 3 refills | Status: DC

## 2021-01-09 MED ORDER — DILTIAZEM HCL ER COATED BEADS 120 MG PO CP24: 120.0000 mg | ORAL_CAPSULE | Freq: Every day | ORAL | 3 refills | Status: DC

## 2021-01-09 NOTE — Assessment & Plan Note (Signed)
Reviewed health habits including diet and exercise and skin cancer prevention Reviewed appropriate screening tests for age  Also reviewed health mt list, fam hx and immunization status , as well as social and family history   See HPI Labs ordered  Colonoscopy 2015 Mammogram due next mo (personal hx of breast ca) dexa ordered , no falls or fractures  Enc flu vaccine in the fall covid immunized  Given materials to work on advance directive  Discussed cognitive status, notes some short term memory slow down, no confusion and able to run her finances Abn vision screen due to not wearing glasses today  Wears hearing aides

## 2021-01-09 NOTE — Progress Notes (Signed)
Subjective:    Patient ID: Sara [name], female    DOB: Sep 15, 1931, 87 y.o.   MRN: [id number]  This visit occurred during the SARS-CoV-2 public health emergency.  Safety protocols were in place, including screening questions prior to the visit, additional usage of staff PPE, and extensive cleaning of exam room while observing appropriate contact time as indicated for disinfecting solutions.   HPI Pt presents for amw and health mt exam with rev of chronic health problems  I have personally reviewed the Medicare Annual Wellness questionnaire and have noted 1. The patient's medical and social history 2. Their use of alcohol, tobacco or illicit drugs 3. Their current medications and supplements 4. The patient's functional ability including ADL's, fall risks, home safety risks and hearing or visual             impairment. 5. Diet and physical activities 6. Evidence for depression or mood disorders  The patients weight, height, BMI have been recorded in the chart and visual acuity is per eye clinic.  I have made referrals, counseling and provided education to the patient based review of the above and I have provided the pt with a written personalized care plan for preventive services. Reviewed and updated provider list, see scanned forms.  See scanned forms.  Routine anticipatory guidance given to patient.  See health maintenance. Colon cancer screening-out aged  Last colonoscopy was 2015 Breast cancer screening Self breast exam -no lumps   mammogram 9/21 Personal h/o breast cancer with anastrozole  Daughter and sister had breast cancer Flu vaccine 9/21 Covid imm with boosters Tetanus vaccine 10/99- postponed for ins Pneumovax-completed Zoster vaccine-shingrix 2019 Dexa 7/18 -osteoporosis Intolerant of oral bisphosphonate and declines other tx or more imaging Decided to get dexa this year  Continues anastrozole Falls- none Fractures-none  Supplements-takes vit D   Exercise -likes to walk (has a lot around house)    Advance directive- has a living will / needs poa, given material to work on  Cognitive function addressed- see scanned forms- and if abnormal then additional documentation follows.   Some short term memory issues  Mostly names (but remembers faces well)  Goes to church/ stays active  Occ misplaces things but she finds them  No confusion that she knows of  Has not become lost  Cannot write as well - but she can do her finances   PMH and SH reviewed  Meds, vitals, and allergies reviewed.   ROS: See HPI.  Otherwise negative.    Weight : Wt Readings from Last 3 Encounters:  01/09/21 146 lb 7 oz (66.4 kg)  04/17/20 150 lb 3.2 oz (68.1 kg)  02/01/20 148 lb 6.4 oz (67.3 kg)   27.22 kg/m  Hearing/vision: Vision Screening   Right eye Left eye Both eyes  Without correction 20/50 20/100 20/50  With correction     Hearing Screening - Comments:: Wears hearing aids Not wearing her glasses today due to mask steaming them up    Care team  Yvett Rossel-pcp SK[facility]-cardiol [place]- onc  HTN with h/o CHF  bp is stable today  No cp or palpitations or headaches or edema  No side effects to medicines  BP Readings from Last 3 Encounters:  01/09/21 126/72  04/17/20 (!) 142/82  02/01/20 (!) 142/82     Pulse Readings from Last 3 Encounters:  01/09/21 62  04/17/20 61  02/01/20 (!) 59     Hyperlipidemia Lab Results  Component Value Date   CHOL 189 01/05/2019  HDL 60.00 01/05/2019   LDLCALC 101 (H) 01/05/2019   LDLDIRECT 143.1 06/16/2013   TRIG 138.0 01/05/2019   CHOLHDL 3 01/05/2019   Due for labs  Intolerant of statins   Prediabetes Lab Results  Component Value Date   HGBA1C 6.1 (H) 04/02/2019   Due for lab  Hands go numb and burn  Wakes up with it  Whole hand-not just certain fingers  Feet- burning pain  Sometimes L middle finger gets stuck    Patient Active Problem List   Diagnosis Date Noted   Aortic root  enlargement ([place]) 01/09/2021   Neck pain 01/18/2020   Left shoulder pain 01/18/2020   Pain in both upper arms 01/18/2020   Thyroid nodule 01/04/2020   Flank pain 11/22/2019   Hand pain, right 10/18/2019   Family history of breast cancer    Family history of prostate cancer    Family history of lung cancer    Carcinoma of upper-outer quadrant of right breast in female, estrogen receptor positive ([place]) 02/23/2019   Pre-operative clearance 02/21/2019   History of breast cancer 02/21/2019   Medicare annual wellness visit, subsequent 01/05/2019   Encounter for screening mammogram for breast cancer 01/05/2019   Knee pain, left 10/14/2018   Arthritis of left hip 07/29/2018   Bradycardia 07/01/2018   Chronic diastolic heart failure ([place]) 12/27/2017   Lumbar spinal stenosis [id number]   Diastolic dysfunction [id number]   Estrogen deficiency 12/16/2016   Routine general medical examination at a health care facility 12/16/2016   CAD (coronary artery disease) 12/04/2016   Mucocele, 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   Eczema 03/06/2015   Prediabetes 02/11/2014   Intertrigo labialis 12/07/2012   CONSTIPATION, CHRONIC 07/03/2010   Osteoporosis 06/24/2008   HYPERCHOLESTEROLEMIA 12/08/2006   Essential hypertension 12/08/2006   GERD 12/08/2006   H/O herpes zoster 12/08/2006   Past Medical History:  Diagnosis Date   Allergy    CAD (coronary artery disease)    Chronic diastolic (congestive) heart failure ([place])    Clinical trial participant    [facility] Genetic studies in familial dementia   Dyspnea 11/28/2016   Family history of breast cancer    Family history of lung cancer    Family history of prostate cancer    Frequent UTI    GERD (gastroesophageal reflux disease)    Hypertension    Osteoarthritis    hips, back   Osteopenia    Pneumonia    Spinal stenosis    Stroke ([place]) 1985   Past Surgical History:  Procedure Laterality Date   ABDOMINAL HYSTERECTOMY     partial has one ovary    APPENDECTOMY     BREAST LUMPECTOMY WITH RADIOACTIVE SEED LOCALIZATION Right 04/07/2019   Procedure: RIGHT BREAST LUMPECTOMY WITH RADIOACTIVE SEED LOCALIZATION;  Surgeon: Jovita Kussmaul, MD;  Location: [facility];  Service: General;  Laterality: Right;   CATARACT EXTRACTION Bilateral    LEFT HEART CATH AND CORONARY ANGIOGRAPHY N/A 11/29/2016   Procedure: Left Heart Cath and Coronary Angiography;  Surgeon: Sherren Mocha, MD;  Location: [facility];  Service: Cardiovascular;  Laterality: N/A;   LUMBAR DISC SURGERY     x 3   rotator cuff surgery Right 2010   Social History   Tobacco Use   Smoking status: Never   Smokeless tobacco: Never  Vaping Use   Vaping Use: Never used  Substance Use Topics   Alcohol use: No    Alcohol/week: 0.0 standard drinks   Drug use: No   Family  History  Problem Relation Age of Onset   Lung cancer Brother 62   Other Mother        kidney tumor   Hypertension Mother    Diabetes Sister    Diabetes Brother    Diabetes Sister        x 2   Colon cancer Maternal Uncle    Other Sister        intestine burst   Breast cancer Maternal Aunt        x 2   Breast cancer Daughter        unknown cancer   Diabetes Son        x 3   Diabetes Daughter    Breast cancer Sister        x2, diagnosed in 57s and 27s   Prostate cancer Maternal Uncle    Esophageal cancer Neg Hx    Rectal cancer Neg Hx    Stomach cancer Neg Hx    Allergies  Allergen Reactions   Amoxicillin     Reaction not known   Atorvastatin     aching   Cefpodoxime Proxetil     Reaction not known   Clarithromycin     Reaction not known   Fosamax [Alendronate Sodium]     Body pain    Furosemide     REACTION: doesn't help   Gabapentin Swelling    Caused severe swellling   Current Outpatient Medications on File Prior to Visit  Medication Sig Dispense Refill   acetaminophen (TYLENOL) 500 MG tablet Take 1,000 mg by mouth every 6 (six) hours as needed.     anastrozole (ARIMIDEX) 1 MG  tablet Take 1 tablet (1 mg total) by mouth daily. 90 tablet 3   aspirin EC 81 MG tablet Take 81 mg by mouth daily.       Cholecalciferol (VITAMIN D-3) 1000 UNITS CAPS Take 1,000 Units by mouth daily.      Docusate Sodium (EQ STOOL SOFTENER PO) Take 100 mg by mouth daily as needed (constipation).      doxylamine, Sleep, (UNISOM) 25 MG tablet Take 25 mg by mouth at bedtime as needed for sleep.      nitroGLYCERIN (NITROSTAT) 0.4 MG SL tablet Place 1 tablet (0.4 mg total) under the tongue every 5 (five) minutes as needed for chest pain (max 3 doses in 15 minutes). 25 tablet 3   polyethylene glycol (MIRALAX / GLYCOLAX) packet Take 17 g by mouth daily.      traMADol (ULTRAM) 50 MG tablet Take 1 tablet (50 mg total) by mouth every 6 (six) hours as needed. 30 tablet 0   vitamin B-12 (CYANOCOBALAMIN) 1000 MCG tablet Take 1,000 mcg by mouth daily.     No current facility-administered medications on file prior to visit.      Review of Systems  Constitutional:  Negative for activity change, appetite change, fatigue, fever and unexpected weight change.  HENT:  Negative for congestion, ear pain, rhinorrhea, sinus pressure and sore throat.   Eyes:  Negative for pain, redness and visual disturbance.  Respiratory:  Negative for cough, shortness of breath and wheezing.   Cardiovascular:  Negative for chest pain and palpitations.  Gastrointestinal:  Negative for abdominal pain, blood in stool, constipation and diarrhea.  Endocrine: Negative for polydipsia and polyuria.  Genitourinary:  Negative for dysuria, frequency and urgency.  Musculoskeletal:  Positive for arthralgias and back pain. Negative for myalgias.       Hands have been bothering her significantly  Joint pain and sometimes burning   Skin:  Negative for pallor and rash.  Allergic/Immunologic: Negative for environmental allergies.  Neurological:  Negative for dizziness, syncope and headaches.  Hematological:  Negative for adenopathy. Does not  bruise/bleed easily.  Psychiatric/Behavioral:  Negative for decreased concentration and dysphoric mood. The patient is not nervous/anxious.       Objective:   Physical Exam Constitutional:      General: She is not in acute distress.    Appearance: Normal appearance. She is well-developed and normal weight. She is not ill-appearing or diaphoretic.  HENT:     Head: Normocephalic and atraumatic.     Right Ear: Tympanic membrane, ear canal and external ear normal.     Left Ear: Tympanic membrane, ear canal and external ear normal.     Nose: Nose normal. No congestion.     Mouth/Throat:     Mouth: Mucous membranes are moist.     Pharynx: Oropharynx is clear. No posterior oropharyngeal erythema.  Eyes:     General: No scleral icterus.    Extraocular Movements: Extraocular movements intact.     Conjunctiva/sclera: Conjunctivae normal.     Pupils: Pupils are equal, round, and reactive to light.  Neck:     Thyroid: No thyromegaly.     Vascular: No carotid bruit or JVD.  Cardiovascular:     Rate and Rhythm: Normal rate and regular rhythm.     Pulses: Normal pulses.     Heart sounds: Normal heart sounds.    No gallop.  Pulmonary:     Effort: Pulmonary effort is normal. No respiratory distress.     Breath sounds: Normal breath sounds. No wheezing.     Comments: Good air exch Chest:     Chest wall: No tenderness.  Abdominal:     General: Bowel sounds are normal. There is no distension or abdominal bruit.     Palpations: Abdomen is soft. There is no mass.     Tenderness: There is no abdominal tenderness.     Hernia: No hernia is present.  Genitourinary:    Comments: Breast exam: No mass, nodules, thickening, tenderness, bulging, retraction, inflamation, nipple discharge or skin changes noted.  No axillary or clavicular LA.     Musculoskeletal:        General: No tenderness. Normal range of motion.     Cervical back: Normal range of motion and neck supple. No rigidity. No muscular  tenderness.     Right lower leg: No edema.     Left lower leg: No edema.     Comments: Mild tenderness mcp joints  No kyphosis     Lymphadenopathy:     Cervical: No cervical adenopathy.  Skin:    General: Skin is warm and dry.     Coloration: Skin is not pale.     Findings: No erythema or rash.     Comments: Few skin tags and lentigines  Neurological:     Mental Status: She is alert. Mental status is at baseline.     Cranial Nerves: No cranial nerve deficit.     Motor: No abnormal muscle tone.     Coordination: Coordination normal.     Gait: Gait normal.     Deep Tendon Reflexes: Reflexes are normal and symmetric. Reflexes normal.  Psychiatric:        Mood and Affect: Mood normal.        Cognition and Memory: Cognition and memory normal.     Comments: Pleasant  Assessment & Plan:   Problem List Items Addressed This Visit       Cardiovascular and Mediastinum   Essential hypertension    bp in fair control at this time  BP Readings from Last 1 Encounters:  01/09/21 126/72  No changes needed Most recent labs reviewed  Disc lifstyle change with low sodium diet and exercise  Plan to continue diltiazem 120 mg daily  hctz 25 mg daily        Relevant Medications   diltiazem (CARDIZEM CD) 120 MG 24 hr capsule   hydrochlorothiazide (HYDRODIURIL) 25 MG tablet   Other Relevant Orders   CBC with Differential/Platelet (Completed)   Comprehensive metabolic panel (Completed)   Lipid panel (Completed)   TSH (Completed)   Chronic diastolic heart failure (HCC)    Stable  No recent sob  Last echo a year ago with cardiology        Relevant Medications   diltiazem (CARDIZEM CD) 120 MG 24 hr capsule   hydrochlorothiazide (HYDRODIURIL) 25 MG tablet     Musculoskeletal and Integument   Osteoporosis    Due for dexa- ordered  Intolerant of oral bisphosphonate  Taking anastrozole  No falls or fractures  Takes vitamin D-level ordered today        Relevant  Orders   VITAMIN D 25 Hydroxy (Vit-D Deficiency, Fractures) (Completed)     Other   HYPERCHOLESTEROLEMIA    Disc goals for lipids and reasons to control them Rev last labs with pt Rev low sat fat diet in detail Intolerant of statins  Labs today       Relevant Medications   diltiazem (CARDIZEM CD) 120 MG 24 hr capsule   hydrochlorothiazide (HYDRODIURIL) 25 MG tablet   Other Relevant Orders   Lipid panel (Completed)   Prediabetes    A1C ordered disc imp of low glycemic diet and wt loss to prevent DM2        Relevant Orders   Hemoglobin A1c (Completed)   Estrogen deficiency   Relevant Orders   DG Bone Density   Routine general medical examination at a health care facility    Reviewed health habits including diet and exercise and skin cancer prevention Reviewed appropriate screening tests for age  Also reviewed health mt list, fam hx and immunization status , as well as social and family history   See HPI Labs ordered  Colonoscopy 2015 Mammogram due next mo (personal hx of breast ca) dexa ordered , no falls or fractures  Enc flu vaccine in the fall covid immunized  Given materials to work on advance directive  Discussed cognitive status, notes some short term memory slow down, no confusion and able to run her finances Abn vision screen due to not wearing glasses today  Wears hearing aides        Medicare annual wellness visit, subsequent - Primary    Reviewed health habits including diet and exercise and skin cancer prevention Reviewed appropriate screening tests for age  Also reviewed health mt list, fam hx and immunization status , as well as social and family history   See HPI Labs ordered  Colonoscopy 2015 Mammogram due next mo (personal hx of breast ca) dexa ordered , no falls or fractures  Enc flu vaccine in the fall covid immunized  Given materials to work on advance directive  Discussed cognitive status, notes some short term memory slow down, no  confusion and able to run her finances Abn vision screen due to not wearing glasses today  Wears hearing aides         History of breast cancer    Continues oncology f/u and anastrozole Doing well  Mammogram due next month        Aortic root enlargement ([place])    Noted 1 y ago on CT Mild  Watched by cardiology and f/u planned in oct

## 2021-01-09 NOTE — Assessment & Plan Note (Signed)
Disc goals for lipids and reasons to control them Rev last labs with pt Rev low sat fat diet in detail Intolerant of statins  Labs today

## 2021-01-09 NOTE — Assessment & Plan Note (Addendum)
Stable  No recent sob  Last echo a year ago with cardiology

## 2021-01-09 NOTE — Patient Instructions (Addendum)
Get your flu shot this fall   I placed an order for a bone density test  You can call the breast center to schedule it   Here is a packet to work on a living will and power of attorney  Fill out the info and get it notarized (blue booklet)   Keep your brain active  Continue to socialize  Walk for exercise (when not too hot)   Keep taking good care of yourself  If you want a referral to a hand specialist in the future let me know

## 2021-01-09 NOTE — Assessment & Plan Note (Signed)
A1C ordered °disc imp of low glycemic diet and wt loss to prevent DM2  °

## 2021-01-09 NOTE — Assessment & Plan Note (Signed)
Continues oncology f/u and anastrozole Doing well  Mammogram due next month

## 2021-01-09 NOTE — Assessment & Plan Note (Signed)
bp in fair control at this time  BP Readings from Last 1 Encounters:  01/09/21 126/72   No changes needed Most recent labs reviewed  Disc lifstyle change with low sodium diet and exercise  Plan to continue diltiazem 120 mg daily  hctz 25 mg daily

## 2021-01-09 NOTE — Assessment & Plan Note (Signed)
Due for dexa- ordered  Intolerant of oral bisphosphonate  Taking anastrozole  No falls or fractures  Takes vitamin D-level ordered today

## 2021-01-09 NOTE — Assessment & Plan Note (Addendum)
Noted 1 y ago on CT Mild  Watched by cardiology and f/u planned in oct

## 2021-01-10 ENCOUNTER — Encounter: Payer: Self-pay | Admitting: *Deleted

## 2021-01-25 ENCOUNTER — Ambulatory Visit (INDEPENDENT_AMBULATORY_CARE_PROVIDER_SITE_OTHER): Payer: Medicare HMO

## 2021-01-25 DIAGNOSIS — I10 Essential (primary) hypertension: Secondary | ICD-10-CM | POA: Diagnosis not present

## 2021-01-25 DIAGNOSIS — I5032 Chronic diastolic (congestive) heart failure: Secondary | ICD-10-CM | POA: Diagnosis not present

## 2021-01-25 DIAGNOSIS — I2510 Atherosclerotic heart disease of native coronary artery without angina pectoris: Secondary | ICD-10-CM | POA: Diagnosis not present

## 2021-01-25 NOTE — Chronic Care Management (AMB) (Signed)
Chronic Care Management   CCM RN Visit Note  01/25/2021 Name: Sara [name] MRN: [id number] DOB: 20-Apr-1932  Subjective: Sara [name] is a 87 y.o. year old female who is a primary care patient of Tower, Wynelle Fanny, MD. The care management team was consulted for assistance with disease management and care coordination needs.    Engaged with patient by telephone for follow up visit in response to provider referral for case management and/or care coordination services.   Consent to Services:  The patient was given information about Chronic Care Management services, agreed to services, and gave verbal consent prior to initiation of services.  Please see initial visit note for detailed documentation.   Patient agreed to services and verbal consent obtained.   Assessment: Review of patient past medical history, allergies, medications, health status, including review of consultants reports, laboratory and other test data, was performed as part of comprehensive evaluation and provision of chronic care management services.   SDOH (Social Determinants of Health) assessments and interventions performed:    CCM Care Plan  Allergies  Allergen Reactions   Amoxicillin     Reaction not known   Atorvastatin     aching   Cefpodoxime Proxetil     Reaction not known   Clarithromycin     Reaction not known   Fosamax [Alendronate Sodium]     Body pain    Furosemide     REACTION: doesn't help   Gabapentin Swelling    Caused severe swellling    Outpatient Encounter Medications as of 01/25/2021  Medication Sig   acetaminophen (TYLENOL) 500 MG tablet Take 1,000 mg by mouth every 6 (six) hours as needed.   anastrozole (ARIMIDEX) 1 MG tablet Take 1 tablet (1 mg total) by mouth daily.   aspirin EC 81 MG tablet Take 81 mg by mouth daily.     Cholecalciferol (VITAMIN D-3) 1000 UNITS CAPS Take 1,000 Units by mouth daily.    diltiazem (CARDIZEM CD) 120 MG 24 hr capsule Take 1 capsule  (120 mg total) by mouth daily.   Docusate Sodium (EQ STOOL SOFTENER PO) Take 100 mg by mouth daily as needed (constipation).    doxylamine, Sleep, (UNISOM) 25 MG tablet Take 25 mg by mouth at bedtime as needed for sleep.    hydrochlorothiazide (HYDRODIURIL) 25 MG tablet Take 1 tablet (25 mg total) by mouth daily.   nitroGLYCERIN (NITROSTAT) 0.4 MG SL tablet Place 1 tablet (0.4 mg total) under the tongue every 5 (five) minutes as needed for chest pain (max 3 doses in 15 minutes).   polyethylene glycol (MIRALAX / GLYCOLAX) packet Take 17 g by mouth daily.    potassium chloride (KLOR-CON) 10 MEQ tablet Take 1 tablet (10 mEq total) by mouth daily.   traMADol (ULTRAM) 50 MG tablet Take 1 tablet (50 mg total) by mouth every 6 (six) hours as needed.   vitamin B-12 (CYANOCOBALAMIN) 1000 MCG tablet Take 1,000 mcg by mouth daily.   No facility-administered encounter medications on file as of 01/25/2021.    Patient Active Problem List   Diagnosis Date Noted   Aortic root enlargement ([place]) 01/09/2021   Neck pain 01/18/2020   Left shoulder pain 01/18/2020   Pain in both upper arms 01/18/2020   Thyroid nodule 01/04/2020   Flank pain 11/22/2019   Hand pain, right 10/18/2019   Family history of breast cancer    Family history of prostate cancer    Family history of lung cancer    Carcinoma of  upper-outer quadrant of right breast in female, estrogen receptor positive ([place]) 02/23/2019   Pre-operative clearance 02/21/2019   History of breast cancer 02/21/2019   Medicare annual wellness visit, subsequent 01/05/2019   Encounter for screening mammogram for breast cancer 01/05/2019   Knee pain, left 10/14/2018   Arthritis of left hip 07/29/2018   Bradycardia 07/01/2018   Chronic diastolic heart failure ([place]) 12/27/2017   Lumbar spinal stenosis [id number]   Diastolic dysfunction 123456   Estrogen deficiency 12/16/2016   Routine general medical examination at a health care facility 12/16/2016   CAD  (coronary artery disease) 12/04/2016   Mucocele, 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   Eczema 03/06/2015   Prediabetes 02/11/2014   Intertrigo labialis 12/07/2012   CONSTIPATION, CHRONIC 07/03/2010   Osteoporosis 06/24/2008   HYPERCHOLESTEROLEMIA 12/08/2006   Essential hypertension 12/08/2006   GERD 12/08/2006   H/O herpes zoster 12/08/2006    Conditions to be addressed/monitored:CHF and HTN  Care Plan : Cardiovascular  Updates made by Dannielle Karvonen, RN since 01/25/2021 12:00 AM     Problem: Disease Progression (Heart Failure and hypertension)   Priority: Medium     Long-Range Goal: Health Optimized for Heart failure and hypertension   Start Date: 12/22/2020  Expected End Date: 05/09/2021  This Visit's Progress: On track  Recent Progress: On track  Priority: Medium  Note:   Current Barriers: Knowledge deficit related to long term care plan for self management of HF and hypertension:  Patient states she is checking her blood pressures at least 1-2 times per week. Reported blood pressures:  135/77, 133/72, 130/70, 130/73.  She states her pulse ranges from 55 to 60's.  Patient reports having annual wellness visit with her primary provider on 01/09/2021.  Per lab review Hgb A1c was 6.3.  Patient states she wants to bring her Hgb A1c down.     Patient denies any heart failure symptoms. She states she does not have a scale to weight daily.  She reports taking her medications as prescribed.  Case Manager Clinical Goal(s):  patient will verbalize understanding of Heart Failure Action Plan and when to call doctor patient will take all Heart Failure mediations as prescribed patient will weigh daily and record (notifying MD of 3 lb weight gain over night or 5 lb in a week) Interventions:  Collaboration with Tower, Wynelle Fanny, MD regarding development and update of comprehensive plan of care as evidenced by provider attestation and co-signature Inter-disciplinary care team collaboration (see longitudinal plan of  care) Provided verbal education on low sodium diet Reviewed Heart Failure Action Plan in depth:  Discussed with patient heart failure symptoms and importance of monitoring weight daily.   Assessed need for readable accurate scales in home:  Advised patient to get scales.  Discussed importance of daily weight and advised patient to weigh and record daily Reviewed role of diuretics in prevention of fluid overload and management of heart failure Patient Goals: - Report symptoms to your doctor immediately:  gaining 3 pounds in one day or 5 pounds in one week, shortness of breath, swelling in feet, ankles, legs or your waistband gets tight.  - Continue to follow a low salt diet plan - watch for swelling in feet, ankles and legs every day - weigh daily and check your blood pressure at least 1 -2 times per week.  Record your readings for your weight and blood pressure. ( Get scales for home use) - Call your doctor for new or ongoing symptoms.  -  Continue to take your  medications as prescribed and refill them timely. -Keep follow up visits with your doctors as recommended.   Follow Up Plan: The patient has been provided with contact information for the care management team and has been advised to call with any health related questions or concerns.  The care management team will reach out to the patient again over the next 45 days.        Plan:The patient has been provided with contact information for the care management team and has been advised to call with any health related questions or concerns.  and The care management team will reach out to the patient again over the next 45 days. Quinn Plowman RN,BSN,CCM RN Case Manager [place]  [phone]

## 2021-01-25 NOTE — Patient Instructions (Signed)
Visit Information:  Thank you for taking the time to speak with me today.  PATIENT GOALS:  Goals Addressed             This Visit's Progress    Track and Manage Symptoms-Heart Failure and hypertension   On track    Timeframe:  Long-Range Goal Priority:  Medium Start Date:    12/22/2020                         Expected End Date:    05/09/2021             Follow Up Date  03/05/2021  - Report symptoms to your doctor immediately:  gaining 3 pounds in one day or 5 pounds in one week, shortness of breath, swelling in feet, ankles, legs or your waistband gets tight.  - Continue to follow a low salt diet plan - watch for swelling in feet, ankles and legs every day - weigh daily and check your blood pressure at least 1 -2 times per week.  Record your readings for your weight and blood pressure. ( Get scales for home use) - Call your doctor for new or ongoing symptoms.  -  Continue to take your medications as prescribed and refill them timely. -Keep follow up visits with your doctors as recommended.    Why is this important?   You will be able to handle your symptoms better if you keep track of them.  Making some simple changes to your lifestyle will help.  Eating healthy is one thing you can do to take good care of yourself.            The patient verbalized understanding of instructions, educational materials, and care plan provided today and agreed to receive a mailed copy of patient instructions, educational materials, and care plan.   The patient has been provided with contact information for the care management team and has been advised to call with any health related questions or concerns.  The care management team will reach out to the patient again over the next 45 days.   Quinn Plowman RN,BSN,CCM RN Case Manager [place]  [phone]

## 2021-02-02 ENCOUNTER — Ambulatory Visit (INDEPENDENT_AMBULATORY_CARE_PROVIDER_SITE_OTHER): Payer: Medicare HMO | Admitting: Internal Medicine

## 2021-02-02 ENCOUNTER — Encounter: Payer: Self-pay | Admitting: Internal Medicine

## 2021-02-02 ENCOUNTER — Other Ambulatory Visit: Payer: Self-pay

## 2021-02-02 VITALS — BP 144/82 | HR 58 | Ht 62.0 in | Wt 148.0 lb

## 2021-02-02 DIAGNOSIS — E041 Nontoxic single thyroid nodule: Secondary | ICD-10-CM | POA: Diagnosis not present

## 2021-02-02 NOTE — Patient Instructions (Signed)
-   I have ordered your thyroid ultrasound through [facility]

## 2021-02-02 NOTE — Progress Notes (Signed)
Name: Sara [name]  MRN/ DOB: [id number], Jan 18, 1932    Age/ Sex: 87 y.o., female     PCP: Tower, Wynelle Fanny, MD   Reason for Endocrinology Evaluation: Right thyroid nodule     Initial Endocrinology Clinic Visit: 02/01/2020    PATIENT IDENTIFIER: Sara [name] is a 87 y.o., female with a past medical history of HTN, thyroid nodule, history of breast cancer( S/P right lumpectomy, chemo, and radiation in 2020), history of CHF. She has followed with [facility] since 02/01/2020 for consultative assistance with management of her right thyroid nodule.   HISTORICAL SUMMARY:   Patient was noted to have an incidental finding of an asymmetrical thyroid gland on CT Angio in 12/2019 during evaluation of thoracic aneurysm. This prompted a thyroid ultrasound demonstrating 4.3 cm right thyroid nodule meeting criteria for FNA.   She is status post right thyroid nodule 4.3 cm FNA with benign cytology (02/08/2020)  SUBJECTIVE:    Today (02/02/2021):  Sara [name] is here for a follow-up on right thyroid nodule She denies local neck swelling or dysphagia  Denies voice hoarseness but has pressure feeling in the neck  Has stiff neck  Has chronic constipation   She follows with oncology for history of right breast cancer   HISTORY:  Past Medical History:  Past Medical History:  Diagnosis Date   Allergy    CAD (coronary artery disease)    Chronic diastolic (congestive) heart failure ([place])    Clinical trial participant    [facility] Genetic studies in familial dementia   Dyspnea 11/28/2016   Family history of breast cancer    Family history of lung cancer    Family history of prostate cancer    Frequent UTI    GERD (gastroesophageal reflux disease)    Hypertension    Osteoarthritis    hips, back   Osteopenia    Pneumonia    Spinal stenosis    Stroke ([place]) 1985   Past Surgical History:  Past Surgical History:  Procedure Laterality Date   ABDOMINAL  HYSTERECTOMY     partial has one ovary   APPENDECTOMY     BREAST LUMPECTOMY WITH RADIOACTIVE SEED LOCALIZATION Right 04/07/2019   Procedure: RIGHT BREAST LUMPECTOMY WITH RADIOACTIVE SEED LOCALIZATION;  Surgeon: Jovita Kussmaul, MD;  Location: [facility];  Service: General;  Laterality: Right;   CATARACT EXTRACTION Bilateral    LEFT HEART CATH AND CORONARY ANGIOGRAPHY N/A 11/29/2016   Procedure: Left Heart Cath and Coronary Angiography;  Surgeon: Sherren Mocha, MD;  Location: [facility];  Service: Cardiovascular;  Laterality: N/A;   LUMBAR DISC SURGERY     x 3   rotator cuff surgery Right 2010   Social History:  reports that she has never smoked. She has never used smokeless tobacco. She reports that she does not drink alcohol and does not use drugs. Family History:  Family History  Problem Relation Age of Onset   Lung cancer Brother 18   Other Mother        kidney tumor   Hypertension Mother    Diabetes Sister    Diabetes Brother    Diabetes Sister        x 2   Colon cancer Maternal Uncle    Other Sister        intestine burst   Breast cancer Maternal Aunt        x 2   Breast cancer Daughter  unknown cancer   Diabetes Son        x 3   Diabetes Daughter    Breast cancer Sister        x2, diagnosed in 35s and 40s   Prostate cancer Maternal Uncle    Esophageal cancer Neg Hx    Rectal cancer Neg Hx    Stomach cancer Neg Hx      HOME MEDICATIONS: Allergies as of 02/02/2021       Reactions   Amoxicillin    Reaction not known   Atorvastatin    aching   Cefpodoxime Proxetil    Reaction not known   Clarithromycin    Reaction not known   Fosamax [alendronate Sodium]    Body pain    Furosemide    REACTION: doesn't help   Gabapentin Swelling   Caused severe swellling        Medication List        Accurate as of February 02, 2021  1:58 PM. If you have any questions, ask your nurse or doctor.          acetaminophen 500 MG tablet Commonly known as:  TYLENOL Take 1,000 mg by mouth every 6 (six) hours as needed.   anastrozole 1 MG tablet Commonly known as: ARIMIDEX Take 1 tablet (1 mg total) by mouth daily.   aspirin EC 81 MG tablet Take 81 mg by mouth daily.   diltiazem 120 MG 24 hr capsule Commonly known as: CARDIZEM CD Take 1 capsule (120 mg total) by mouth daily.   doxylamine (Sleep) 25 MG tablet Commonly known as: UNISOM Take 25 mg by mouth at bedtime as needed for sleep.   EQ STOOL SOFTENER PO Take 100 mg by mouth daily as needed (constipation).   hydrochlorothiazide 25 MG tablet Commonly known as: HYDRODIURIL Take 1 tablet (25 mg total) by mouth daily.   nitroGLYCERIN 0.4 MG SL tablet Commonly known as: NITROSTAT Place 1 tablet (0.4 mg total) under the tongue every 5 (five) minutes as needed for chest pain (max 3 doses in 15 minutes).   polyethylene glycol 17 g packet Commonly known as: MIRALAX / GLYCOLAX Take 17 g by mouth daily.   potassium chloride 10 MEQ tablet Commonly known as: KLOR-CON Take 1 tablet (10 mEq total) by mouth daily.   traMADol 50 MG tablet Commonly known as: ULTRAM Take 1 tablet (50 mg total) by mouth every 6 (six) hours as needed.   vitamin B-12 1000 MCG tablet Commonly known as: CYANOCOBALAMIN Take 1,000 mcg by mouth daily.   Vitamin D-3 25 MCG (1000 UT) Caps Take 1,000 Units by mouth daily.          OBJECTIVE:   PHYSICAL EXAM: VS: Pulse (!) 58   Ht '5\' 2"'$  (1.575 m)   Wt 148 lb (67.1 kg)   SpO2 97%   BMI 27.07 kg/m    EXAM: General: Pt appears well and is in NAD  Neck: General: Supple without adenopathy. Thyroid: Thyroid size normal.  No goiter or nodules appreciated.  Lungs: Clear with good BS bilat with no rales, rhonchi, or wheezes  Heart: Auscultation: RRR.  Abdomen: Normoactive bowel sounds, soft, nontender, without masses or organomegaly palpable  Extremities:  BL LE: No pretibial edema normal ROM and strength.  Skin: Hair: Texture and amount normal with  gender appropriate distribution Skin Inspection: No rashes Skin Palpation: Skin temperature, texture, and thickness normal to palpation  Neuro:  DTRs: 2+ and symmetric in UE without delay in relaxation  phase  Mental Status: Judgment, insight: Intact Orientation: Oriented to time, place, and person Mood and affect: No depression, anxiety, or agitation     DATA REVIEWED: Results for TYSHEA, THELEN (MRN [id number]) as of 02/02/2021 13:58  Ref. Range 01/09/2021 10:11  TSH Latest Ref Range: 0.35 - 5.50 uIU/mL 1.76     Thyroid ultrasound 01/11/2020 Parenchymal Echotexture: Mildly heterogenous   Isthmus: 0.2 cm   Right lobe: 5.4 x 2.7 x 2.1 cm   Left lobe: 4.2 x 1.1 x 1.9 cm   _________________________________________________________   Estimated total number of nodules >/= 1 cm: 1   Number of spongiform nodules >/=  2 cm not described below (TR1): 0   Number of mixed cystic and solid nodules >/= 1.5 cm not described below (TR2): 0   _________________________________________________________   Nodule # 1:   Location: Right; Mid   Maximum size: 4.3 cm; Other 2 dimensions: 3.1 x 2.0 cm   Composition: solid/almost completely solid (2)   Echogenicity: isoechoic (1)   Shape: not taller-than-wide (0)   Margins: smooth (0)   Echogenic foci: none (0)   ACR TI-RADS total points: 3.   ACR TI-RADS risk category: TR3 (3 points).   ACR TI-RADS recommendations:   **Given size (>/= 2.5 cm) and appearance, fine needle aspiration of this mildly suspicious nodule should be considered based on TI-RADS criteria.   _________________________________________________________   IMPRESSION: Solitary 4.3 cm TI-RADS category 3 (mildly suspicious) nodule occupying the right mid and inferior thyroid gland. This lesion meets criteria to consider fine-needle aspiration biopsy.   The above is in keeping with the ACR TI-RADS recommendations - J Am Coll Radiol 2017;14:587-595.      FNA right thyroid nodule 02/08/2020  Clinical History: Location: Right; Mid, Maximum Size: 4.3 cm; Other 2  dimensions: 3.1 x 2.0 cm, solid/almost completely solid (2), Isoechoic  (1), ACR TI-RADS Total Points 3.  Specimen Submitted:  A. THYROID, RIGHT LOBE, FINE NEEDLE ASPIRATION:    FINAL MICROSCOPIC DIAGNOSIS:  - Consistent with benign follicular nodule ([place] category II)    ASSESSMENT / PLAN / RECOMMENDATIONS:   Right thyroid nodule:   -Patient is clinically and biochemically euthyroid -No local neck symptoms -Status post benign FNA of the right thyroid nodule 01/2020 -We will proceed with a follow-up thyroid ultrasound     Follow-up in 1 year   Signed electronically by: Mack Guise, MD  [facility] Endocrinology  [address] [place], [place] [address] Phone: [phone] FAX: [phone]      CC: Tower, Wynelle Fanny, MD [place] [address] Phone: [phone]  Fax: [phone]   Return to Endocrinology clinic as below: Future Appointments  Date Time Provider [place]  02/02/2021  2:00 PM Mallie Linnemann, Melanie Crazier, MD [facility] None  03/05/2021 10:00 AM [facility]  03/19/2021  9:40 AM Jerline Pain, MD [facility]  04/17/2021  2:15 PM Nicholas Lose, MD [facility] None

## 2021-02-20 ENCOUNTER — Ambulatory Visit
Admission: RE | Admit: 2021-02-20 | Discharge: 2021-02-20 | Disposition: A | Discharge status: Home / Self Care | Payer: Medicare HMO | Source: Ambulatory Visit | Attending: Internal Medicine | Admitting: Internal Medicine

## 2021-02-20 DIAGNOSIS — E041 Nontoxic single thyroid nodule: Secondary | ICD-10-CM

## 2021-02-21 ENCOUNTER — Encounter: Payer: Self-pay | Admitting: Internal Medicine

## 2021-02-22 ENCOUNTER — Other Ambulatory Visit: Payer: Self-pay | Admitting: Hematology and Oncology

## 2021-03-05 ENCOUNTER — Telehealth: Payer: Medicare HMO

## 2021-03-05 DIAGNOSIS — Z780 Asymptomatic menopausal state: Secondary | ICD-10-CM | POA: Diagnosis not present

## 2021-03-05 DIAGNOSIS — M810 Age-related osteoporosis without current pathological fracture: Secondary | ICD-10-CM | POA: Diagnosis not present

## 2021-03-05 DIAGNOSIS — M85851 Other specified disorders of bone density and structure, right thigh: Secondary | ICD-10-CM | POA: Diagnosis not present

## 2021-03-05 DIAGNOSIS — Z853 Personal history of malignant neoplasm of breast: Secondary | ICD-10-CM | POA: Diagnosis not present

## 2021-03-05 DIAGNOSIS — M85852 Other specified disorders of bone density and structure, left thigh: Secondary | ICD-10-CM | POA: Diagnosis not present

## 2021-03-05 LAB — HM MAMMOGRAPHY

## 2021-03-05 LAB — HM DEXA SCAN

## 2021-03-08 ENCOUNTER — Ambulatory Visit (INDEPENDENT_AMBULATORY_CARE_PROVIDER_SITE_OTHER): Payer: Medicare HMO

## 2021-03-08 DIAGNOSIS — I2510 Atherosclerotic heart disease of native coronary artery without angina pectoris: Secondary | ICD-10-CM

## 2021-03-08 DIAGNOSIS — I10 Essential (primary) hypertension: Secondary | ICD-10-CM

## 2021-03-08 DIAGNOSIS — I5032 Chronic diastolic (congestive) heart failure: Secondary | ICD-10-CM

## 2021-03-08 NOTE — Chronic Care Management (AMB) (Signed)
Chronic Care Management   CCM RN Visit Note  03/08/2021 Name: Sara [name] MRN: [id number] DOB: 07-25-1931  Subjective: Sara [name] is a 87 y.o. year old female who is a primary care patient of Tower, Wynelle Fanny, MD. The care management team was consulted for assistance with disease management and care coordination needs.    Engaged with patient by telephone for follow up visit in response to provider referral for case management and/or care coordination services.   Consent to Services:  The patient was given information about Chronic Care Management services, agreed to services, and gave verbal consent prior to initiation of services.  Please see initial visit note for detailed documentation.   Patient agreed to services and verbal consent obtained.   Assessment: Review of patient past medical history, allergies, medications, health status, including review of consultants reports, laboratory and other test data, was performed as part of comprehensive evaluation and provision of chronic care management services.   SDOH (Social Determinants of Health) assessments and interventions performed:    CCM Care Plan  Allergies  Allergen Reactions   Amoxicillin     Reaction not known   Atorvastatin     aching   Cefpodoxime Proxetil     Reaction not known   Clarithromycin     Reaction not known   Fosamax [Alendronate Sodium]     Body pain    Furosemide     REACTION: doesn't help   Gabapentin Swelling    Caused severe swellling    Outpatient Encounter Medications as of 03/08/2021  Medication Sig   acetaminophen (TYLENOL) 500 MG tablet Take 1,000 mg by mouth every 6 (six) hours as needed.   anastrozole (ARIMIDEX) 1 MG tablet TAKE 1 TABLET EVERY DAY   aspirin EC 81 MG tablet Take 81 mg by mouth daily.     Cholecalciferol (VITAMIN D-3) 1000 UNITS CAPS Take 1,000 Units by mouth daily.    diltiazem (CARDIZEM CD) 120 MG 24 hr capsule Take 1 capsule (120 mg total) by  mouth daily.   Docusate Sodium (EQ STOOL SOFTENER PO) Take 100 mg by mouth daily as needed (constipation).    doxylamine, Sleep, (UNISOM) 25 MG tablet Take 25 mg by mouth at bedtime as needed for sleep.    hydrochlorothiazide (HYDRODIURIL) 25 MG tablet Take 1 tablet (25 mg total) by mouth daily.   nitroGLYCERIN (NITROSTAT) 0.4 MG SL tablet Place 1 tablet (0.4 mg total) under the tongue every 5 (five) minutes as needed for chest pain (max 3 doses in 15 minutes).   polyethylene glycol (MIRALAX / GLYCOLAX) packet Take 17 g by mouth daily.    potassium chloride (KLOR-CON) 10 MEQ tablet Take 1 tablet (10 mEq total) by mouth daily.   traMADol (ULTRAM) 50 MG tablet Take 1 tablet (50 mg total) by mouth every 6 (six) hours as needed.   vitamin B-12 (CYANOCOBALAMIN) 1000 MCG tablet Take 1,000 mcg by mouth daily.   No facility-administered encounter medications on file as of 03/08/2021.    Patient Active Problem List   Diagnosis Date Noted   Aortic root enlargement ([place]) 01/09/2021   Neck pain 01/18/2020   Left shoulder pain 01/18/2020   Pain in both upper arms 01/18/2020   Thyroid nodule 01/04/2020   Flank pain 11/22/2019   Hand pain, right 10/18/2019   Family history of breast cancer    Family history of prostate cancer    Family history of lung cancer    Carcinoma of upper-outer quadrant of right  breast in female, estrogen receptor positive ([place]) 02/23/2019   Pre-operative clearance 02/21/2019   History of breast cancer 02/21/2019   Medicare annual wellness visit, subsequent 01/05/2019   Encounter for screening mammogram for breast cancer 01/05/2019   Knee pain, left 10/14/2018   Arthritis of left hip 07/29/2018   Bradycardia 07/01/2018   Chronic diastolic heart failure ([place]) 12/27/2017   Lumbar spinal stenosis 04/03/8526   Diastolic dysfunction 78/24/2353   Estrogen deficiency 12/16/2016   Routine general medical examination at a health care facility 12/16/2016   CAD (coronary artery  disease) 12/04/2016   Mucocele, 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   Eczema 03/06/2015   Prediabetes 02/11/2014   Intertrigo labialis 12/07/2012   CONSTIPATION, CHRONIC 07/03/2010   Osteoporosis 06/24/2008   HYPERCHOLESTEROLEMIA 12/08/2006   Essential hypertension 12/08/2006   GERD 12/08/2006   H/O herpes zoster 12/08/2006    Conditions to be addressed/monitored:CHF and HTN  Care Plan : Cardiovascular  Updates made by Dannielle Karvonen, RN since 03/08/2021 12:00 AM     Problem: Disease Progression (Heart Failure and hypertension)   Priority: Medium     Long-Range Goal: Health Optimized for Heart failure and hypertension   Start Date: 12/22/2020  Expected End Date: 06/08/2021  This Visit's Progress: On track  Recent Progress: On track  Priority: Medium  Note:   Current Barriers: Knowledge deficit related to long term care plan for self management of HF and hypertension:  Patient states she is doing "ok."  She denies symptoms of increase shortness of breath and extremity swelling.   She reports recent blood pressure readings:  106/68, 134/71, 139/91.  Patient denies any changes in her medications.   Patient reports next follow up visit with cardiologist is 03/19/2021 and follow up with oncologist is on 04/17/2021.  Patient denies any new concerns at this time.  Case Manager Clinical Goal(s):  patient will verbalize understanding of Heart Failure Action Plan and when to call doctor patient will take all Heart Failure mediations as prescribed patient will weigh daily and record (notifying MD of 3 lb weight gain over night or 5 lb in a week) Interventions:  Collaboration with Tower, Wynelle Fanny, MD regarding development and update of comprehensive plan of care as evidenced by provider attestation and co-signature Inter-disciplinary care team collaboration (see longitudinal plan of care) Provided verbal education on low sodium diet Reviewed Heart Failure Action Plan in depth   Assessed need for readable  accurate scales in home  Discussed importance of daily weight and advised patient to weigh and record daily Reviewed role of diuretics in prevention of fluid overload and management of heart failure Patient Goals: - Report symptoms to your doctor immediately:  gaining 3 pounds in one day or 5 pounds in one week,  increase shortness of breath, swelling in feet, ankles, legs or your waistband gets tight.  - Continue to follow a low salt diet plan - weigh daily and check your blood pressure at least 1 -2 times per week.  Record your readings for your weight and blood pressure. ( Get scales for home use) - Call your doctor for new or ongoing symptoms.  -  Continue to take your medications as prescribed and refill them timely. - Keep follow up visits with your doctors as recommended.  Follow Up Plan: The patient has been provided with contact information for the care management team and has been advised to call with any health related questions or concerns.  The care management team will reach out to the patient  again over the next 2-3 months.        Plan:The patient has been provided with contact information for the care management team and has been advised to call with any health related questions or concerns.  and The care management team will reach out to the patient again over the next 2-3 months. Quinn Plowman RN,BSN,CCM RN Case Manager [place] [phone]

## 2021-03-08 NOTE — Patient Instructions (Signed)
Visit Information:  Thank you for taking the time to speak with me today.   PATIENT GOALS:  Goals Addressed             This Visit's Progress    Track and Manage Symptoms-Heart Failure and hypertension   On track    Timeframe:  Long-Range Goal Priority:  Medium Start Date:    12/22/2020                         Expected End Date:    06/08/2021         Follow Up Date  05/24/2021  - Report symptoms to your doctor immediately:  gaining 3 pounds in one day or 5 pounds in one week,  increase shortness of breath, swelling in feet, ankles, legs or your waistband gets tight.  - Continue to follow a low salt diet plan - weigh daily and check your blood pressure at least 1 -2 times per week.  Record your readings for your weight and blood pressure. ( Get scales for home use) - Call your doctor for new or ongoing symptoms.  -  Continue to take your medications as prescribed and refill them timely. - Keep follow up visits with your doctors as recommended.    Why is this important?   You will be able to handle your symptoms better if you keep track of them.  Making some simple changes to your lifestyle will help.  Eating healthy is one thing you can do to take good care of yourself.            The patient verbalized understanding of instructions, educational materials, and care plan provided today and agreed to receive a mailed copy of patient instructions, educational materials, and care plan.   The patient has been provided with contact information for the care management team and has been advised to call with any health related questions or concerns.  The care management team will reach out to the patient again over the next 2-3 months.Quinn Plowman RN,BSN,CCM RN Case Manager [place]  [phone]

## 2021-03-09 DIAGNOSIS — I10 Essential (primary) hypertension: Secondary | ICD-10-CM | POA: Diagnosis not present

## 2021-03-09 DIAGNOSIS — I2510 Atherosclerotic heart disease of native coronary artery without angina pectoris: Secondary | ICD-10-CM

## 2021-03-09 DIAGNOSIS — I5032 Chronic diastolic (congestive) heart failure: Secondary | ICD-10-CM

## 2021-03-19 ENCOUNTER — Other Ambulatory Visit: Payer: Self-pay

## 2021-03-19 ENCOUNTER — Ambulatory Visit: Payer: Medicare HMO | Admitting: Cardiology

## 2021-03-19 VITALS — BP 180/80 | HR 59 | Ht 62.0 in | Wt 148.0 lb

## 2021-03-19 DIAGNOSIS — R0600 Dyspnea, unspecified: Secondary | ICD-10-CM | POA: Diagnosis not present

## 2021-03-19 DIAGNOSIS — I7789 Other specified disorders of arteries and arterioles: Secondary | ICD-10-CM

## 2021-03-19 DIAGNOSIS — I2510 Atherosclerotic heart disease of native coronary artery without angina pectoris: Secondary | ICD-10-CM

## 2021-03-19 DIAGNOSIS — R0789 Other chest pain: Secondary | ICD-10-CM

## 2021-03-19 NOTE — Assessment & Plan Note (Signed)
42 mm on CT scan. Will monitor

## 2021-03-19 NOTE — Assessment & Plan Note (Signed)
Mild nonobstructive disease previously seen on cardiac catheterization.  Continue with goal-directed medical therapy.  She is on aspirin 81 mg continuing to treat diabetes.

## 2021-03-19 NOTE — Patient Instructions (Signed)
Medication Instructions:  °Your physician recommends that you continue on your current medications as directed. Please refer to the Current Medication list given to you today. ° °*If you need a refill on your cardiac medications before your next appointment, please call your pharmacy* ° ° °Lab Work: °None °If you have labs (blood work) drawn today and your tests are completely normal, you will receive your results only by: °MyChart Message (if you have MyChart) OR °A paper copy in the mail °If you have any lab test that is abnormal or we need to change your treatment, we will call you to review the results. ° ° °Follow-Up: °At [facility], you and your health needs are our priority.  As part of our continuing mission to provide you with exceptional heart care, we have created designated Provider Care Teams.  These Care Teams include your primary Cardiologist (physician) and Advanced Practice Providers (APPs -  Physician Assistants and Nurse Practitioners) who all work together to provide you with the care you need, when you need it. ° °We recommend signing up for the patient portal called "MyChart".  Sign up information is provided on this After Visit Summary.  MyChart is used to connect with patients for Virtual Visits (Telemedicine).  Patients are able to view lab/test results, encounter notes, upcoming appointments, etc.  Non-urgent messages can be sent to your provider as well.   °To learn more about what you can do with MyChart, go to [email or web address].   ° °Your next appointment:   °1 year(s) ° °The format for your next appointment:   °In Person ° °Provider:   °Mark Skains, MD    °

## 2021-03-19 NOTE — Progress Notes (Signed)
Cardiology Office Note:    Date:  03/19/2021   ID:  Sara [name], DOB 1931/10/25, MRN [id number]  PCP:  Abner Greenspan, MD   [facility] Providers Cardiologist:  Candee Furbish, MD     Referring MD: Abner Greenspan, MD    History of Present Illness:    Sara [name] is a 87 y.o. female here for follow-up of coronary artery disease, mild with no PCI intervention on prior cardiac catheterization with mild anterolateral wall hypokinesis EDP 18 mmHg, 11/29/2016.  Also has chronic diastolic heart failure hypertension GERD.  Dyspnea on exertion ER visit within the last year.  Her mother lived to age 49.   Ventricular bigeminy was noted on telemetry during the hospital stay creating an effective pulse rate in the 40s.   Lasix was listed as a allergy on her medications.  She is not recalling the reason why she is allergic to Lasix.  She is on hydrochlorothiazide   Her daughter was intolerant to Lipitor.  She also stopped her atorvastatin back on early October 2018.   She could not tolerate PFTs.  Past Medical History:  Diagnosis Date   Allergy    CAD (coronary artery disease)    Chronic diastolic (congestive) heart failure ([place])    Clinical trial participant    [facility] Genetic studies in familial dementia   Dyspnea 11/28/2016   Family history of breast cancer    Family history of lung cancer    Family history of prostate cancer    Frequent UTI    GERD (gastroesophageal reflux disease)    Hypertension    Osteoarthritis    hips, back   Osteopenia    Pneumonia    Spinal stenosis    Stroke ([place]) 1985    Past Surgical History:  Procedure Laterality Date   ABDOMINAL HYSTERECTOMY     partial has one ovary   APPENDECTOMY     BREAST LUMPECTOMY WITH RADIOACTIVE SEED LOCALIZATION Right 04/07/2019   Procedure: RIGHT BREAST LUMPECTOMY WITH RADIOACTIVE SEED LOCALIZATION;  Surgeon: Jovita Kussmaul, MD;  Location: [place];  Service: General;  Laterality: Right;    CATARACT EXTRACTION Bilateral    LEFT HEART CATH AND CORONARY ANGIOGRAPHY N/A 11/29/2016   Procedure: Left Heart Cath and Coronary Angiography;  Surgeon: Sherren Mocha, MD;  Location: [place] [facility];  Service: Cardiovascular;  Laterality: N/A;   LUMBAR DISC SURGERY     x 3   rotator cuff surgery Right 2010    Current Medications: Current Meds  Medication Sig   acetaminophen (TYLENOL) 500 MG tablet Take 1,000 mg by mouth every 6 (six) hours as needed.   anastrozole (ARIMIDEX) 1 MG tablet TAKE 1 TABLET EVERY DAY   aspirin EC 81 MG tablet Take 81 mg by mouth daily.     Cholecalciferol (VITAMIN D-3) 1000 UNITS CAPS Take 1,000 Units by mouth daily.    diltiazem (CARDIZEM CD) 120 MG 24 hr capsule Take 1 capsule (120 mg total) by mouth daily.   Docusate Sodium (EQ STOOL SOFTENER PO) Take 100 mg by mouth daily as needed (constipation).    doxylamine, Sleep, (UNISOM) 25 MG tablet Take 25 mg by mouth at bedtime as needed for sleep.    hydrochlorothiazide (HYDRODIURIL) 25 MG tablet Take 1 tablet (25 mg total) by mouth daily.   nitroGLYCERIN (NITROSTAT) 0.4 MG SL tablet Place 1 tablet (0.4 mg total) under the tongue every 5 (five) minutes as needed for chest pain (max 3 doses  in 15 minutes).   polyethylene glycol (MIRALAX / GLYCOLAX) packet Take 17 g by mouth daily.    potassium chloride (KLOR-CON) 10 MEQ tablet Take 1 tablet (10 mEq total) by mouth daily.   traMADol (ULTRAM) 50 MG tablet Take 1 tablet (50 mg total) by mouth every 6 (six) hours as needed.   vitamin B-12 (CYANOCOBALAMIN) 1000 MCG tablet Take 1,000 mcg by mouth daily.     Allergies:   Amoxicillin, Atorvastatin, Cefpodoxime proxetil, Clarithromycin, Fosamax [alendronate sodium], Furosemide, and Gabapentin   Social History   Socioeconomic History   Marital status: Married    Spouse name: Not on file   Number of children: 6   Years of education: Not on file   Highest education level: Not on file  Occupational History    Occupation: RETIRED    Employer: RETIRED  Tobacco Use   Smoking status: Never   Smokeless tobacco: Never  Vaping Use   Vaping Use: Never used  Substance and Sexual Activity   Alcohol use: No    Alcohol/week: 0.0 standard drinks   Drug use: No   Sexual activity: Not on file  Other Topics Concern   Not on file  Social History Narrative   Not on file   Social Determinants of Health   Financial Resource Strain: Not on file  Food Insecurity: No Food Insecurity   Worried About Charity fundraiser in the Last Year: Never true   [place] in the Last Year: Never true  Transportation Needs: No Transportation Needs   Lack of Transportation (Medical): No   Lack of Transportation (Non-Medical): No  Physical Activity: Not on file  Stress: Not on file  Social Connections: Not on file     Family History: The patient's family history includes Breast cancer in her daughter, maternal aunt, and sister; Colon cancer in her maternal uncle; Diabetes in her brother, daughter, sister, sister, and son; Hypertension in her mother; Lung cancer (age of onset: 110) in her brother; Other in her mother and sister; Prostate cancer in her maternal uncle. There is no history of Esophageal cancer, Rectal cancer, or Stomach cancer.  ROS:   Please see the history of present illness.     All other systems reviewed and are negative.  EKGs/Labs/Other Studies Reviewed:    The following studies were reviewed today: LHC on 11/29/16 which showed moderate stenosis of small to medium caliber non dominant RCA (70%) and minimal irregularities of left system, EF 50-55% with mild segmental LV dysfunction of distal anterolateral wall and mildly elevated filling pressures. Medical therapy was recommended.    Diagnostic Dominance: Left    12/10/19:    1. Left ventricular ejection fraction, by estimation, is 60 to 65%. The  left ventricle has normal function. The left ventricle has no regional  wall motion  abnormalities. Left ventricular diastolic parameters are  indeterminate.   2. Right ventricular systolic function is normal. The right ventricular  size is normal. There is moderately elevated pulmonary artery systolic  pressure. The estimated right ventricular systolic pressure is 24.2 mmHg.   3. The mitral valve is normal in structure. Mild to moderate mitral valve  regurgitation.   4. Tricuspid valve regurgitation is mild to moderate.   5. The aortic valve is tricuspid. Aortic valve regurgitation is mild.  Mild aortic valve sclerosis is present, with no evidence of aortic valve  stenosis.   6. Pulmonic valve regurgitation is moderate.   7. The inferior vena cava is normal  in size with greater than 50%  respiratory variability, suggesting right atrial pressure of 3 mmHg.   8. Aortic dilatation noted. Aneurysm of the ascending aorta, measuring 45  mm. Recommend CTA or MRA for further evaluation    CTA 12/06/45: Mild uncomplicated fusiform aneurysmal dilatation of the ascending thoracic aorta measuring 42 mm in diameter    EKG:  EKG is  ordered today.  The ekg ordered today demonstrates SB 17 LAFB  Recent Labs: 01/09/2021: ALT 11; BUN 15; Creatinine, Ser 0.91; Hemoglobin 13.8; Platelets 190.0; Potassium 3.9; Sodium 141; TSH 1.76  Recent Lipid Panel    Component Value Date/Time   CHOL 192 01/09/2021 1011   TRIG 143.0 01/09/2021 1011   HDL 64.60 01/09/2021 1011   CHOLHDL 3 01/09/2021 1011   VLDL 28.6 01/09/2021 1011   LDLCALC 99 01/09/2021 1011   LDLDIRECT 143.1 06/16/2013 1510        Physical Exam:    VS:  BP (!) 180/80 (BP Location: Left Arm, Patient Position: Sitting, Cuff Size: Normal)   Pulse (!) 59   Ht 5\' 2"  (1.575 m)   Wt 148 lb (67.1 kg)   SpO2 96%   BMI 27.07 kg/m     Wt Readings from Last 3 Encounters:  03/19/21 148 lb (67.1 kg)  02/02/21 148 lb (67.1 kg)  01/09/21 146 lb 7 oz (66.4 kg)     GEN:  Well nourished, well developed in no acute distress HEENT:  Normal NECK: No JVD; No carotid bruits LYMPHATICS: No lymphadenopathy CARDIAC: RRR, no murmurs, rubs, gallops RESPIRATORY:  Clear to auscultation without rales, wheezing or rhonchi  ABDOMEN: Soft, non-tender, non-distended MUSCULOSKELETAL:  No edema; No deformity  SKIN: Warm and dry NEUROLOGIC:  Alert and oriented x 3 PSYCHIATRIC:  Normal affect   ASSESSMENT:    1. Chest tightness   2. Coronary artery disease involving native coronary artery of native heart without angina pectoris   3. Dyspnea, unspecified type   4. Aortic root enlargement (HCC)    PLAN:    In order of problems listed above:  CAD (coronary artery disease) Mild nonobstructive disease previously seen on cardiac catheterization.  Continue with goal-directed medical therapy.  She is on aspirin 81 mg continuing to treat diabetes.  Aortic root enlargement (HCC) 42 mm on CT scan. Will monitor     Medication Adjustments/Labs and Tests Ordered: Current medicines are reviewed at length with the patient today.  Concerns regarding medicines are outlined above.  Orders Placed This Encounter  Procedures   EKG 12-Lead   No orders of the defined types were placed in this encounter.   Patient Instructions  Medication Instructions:  Your physician recommends that you continue on your current medications as directed. Please refer to the Current Medication list given to you today.  *If you need a refill on your cardiac medications before your next appointment, please call your pharmacy*   Lab Work: None If you have labs (blood work) drawn today and your tests are completely normal, you will receive your results only by: [place] (if you have MyChart) OR A paper copy in the mail If you have any lab test that is abnormal or we need to change your treatment, we will call you to review the results.   Follow-Up: At [facility] you and your health needs are our priority.  As part of our continuing mission to  provide you with exceptional heart care, we have created designated Provider Care Teams.  These Care Teams include  your primary Cardiologist (physician) and Advanced Practice Providers (APPs -  Physician Assistants and Nurse Practitioners) who all work together to provide you with the care you need, when you need it.  We recommend signing up for the patient portal called "MyChart".  Sign up information is provided on this After Visit Summary.  MyChart is used to connect with patients for Virtual Visits (Telemedicine).  Patients are able to view lab/test results, encounter notes, upcoming appointments, etc.  Non-urgent messages can be sent to your provider as well.   To learn more about what you can do with MyChart, go to [email or web address].    Your next appointment:   1 year(s)  The format for your next appointment:   In Person  Provider:   Candee Furbish, MD     Signed, Candee Furbish, MD  03/19/2021 10:45 AM    [place]

## 2021-03-30 ENCOUNTER — Encounter: Payer: Self-pay | Admitting: Family Medicine

## 2021-03-30 ENCOUNTER — Telehealth: Payer: Self-pay | Admitting: *Deleted

## 2021-03-30 NOTE — Telephone Encounter (Signed)
Aware, thanks!

## 2021-03-30 NOTE — Telephone Encounter (Signed)
We received pt's DEXA scan and Dr. Marliss Coots comments on it was.  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 I would like to see if we could get Prolia covered. Let me know what she thinks."  Called pt and advised her of this info pt would like to proceed with checking coverage of Prolia. I advised pt Anastasiya will check coverage of injection and we will update her then.    Will route message to PCP and Anastasiya, copy of DEXA and Mammogram abstracted and sent to scanning

## 2021-04-02 NOTE — Telephone Encounter (Signed)
Benefits submitted. Awaiting response.

## 2021-04-11 NOTE — Telephone Encounter (Signed)
Noted. Updated my notes.

## 2021-04-11 NOTE — Telephone Encounter (Signed)
Benefits received. OOP cost is $295. Patient was advised. Patient states she read up on side effects from Prolia and knows people that have had a lot of pain after these injections and she would rather not have more pain that she already does. I did explain to the patient that everyone can have different symptoms and that we do not have a lot of patients at the office that complain of pain, I have had 2 patients so far mentioning. Advised patient I would let PCP know to see if there is another option. She had pain with Fosamax also per her chart.

## 2021-04-11 NOTE — Telephone Encounter (Signed)
Pt notified of Dr. Marliss Coots comments, she does want to hold off on Prolia she will make sure she takes calcium and vitamin D. She will also f/u with oncologist at next appt.   FYI to [employer]

## 2021-04-11 NOTE — Telephone Encounter (Signed)
She cannot take bisphosphonate due to side effect, cannot use evista due to anastrazole   Prolia was the next choice, and I understand her apprehension.  Recommend she ask opinion of her oncologist next time she goes. We will hold off for now.  Continue supplements D and /or calcium.

## 2021-04-16 NOTE — Progress Notes (Signed)
Patient Care Team: Tower, Wynelle Fanny, MD as PCP - General Marl[facility] Porch Thana Farr, MD as PCP - Cardiology (Cardiology) Jovita Kussmaul, MD as Consulting Physician (General Surgery) Eppie Gibson, MD as Attending Physician (Radiation Oncology) Nicholas Lose, MD as Consulting Physician (Hematology and Oncology) Dannielle Karvonen, RN as Case Manager  DIAGNOSIS:    ICD-10-CM   1. Carcinoma of upper-outer quadrant of right breast in female, estrogen receptor positive ([place])  C50.411    Z17.0       SUMMARY OF ONCOLOGIC HISTORY: Oncology History  Carcinoma of upper-outer quadrant of right breast in female, estrogen receptor positive ([place])  02/23/2019 Initial Diagnosis   Routine screening mammogram detected a 1.2cm mass in the right breast. Biopsy showed invasive mammary carcinoma with mammary carcinoma in situ, HER-2 - (1+), ER+ 100%, PR+ 5% Ki67 2%.    02/23/2019 Cancer Staging   Staging form: Breast, AJCC 8th Edition - Clinical stage from 02/23/2019: cT1c, cN0, cM0, GX, ER+, PR+, HER2-    04/07/2019 Cancer Staging   Staging form: Breast, AJCC 8th Edition - Pathologic stage from 04/07/2019: Stage IA (pT1c, pN0, cM0, G2, ER+, PR+, HER2-)    04/07/2019 Surgery   Right lumpectomy Marlou Starks) [phone]): IDC, grade 2, 1.3cm, with intermediate grade DCIS, clear margins. No regional lymph nodes were examined.   05/10/2019 - 05/28/2019 Radiation Therapy   The patient initially received a dose of 40.05 Gy in 15 fractions to the right breast and axilla using whole-breast tangent fields. This was delivered using a 3-D conformal technique. The pt did not receive a boost. The total dose was 40.05 Gy.   04/2019 - 04/2024 Anti-estrogen oral therapy   Anastrozole     CHIEF COMPLIANT: Follow-up of right breast cancer on anastrozole   INTERVAL HISTORY: Sara [name] is a 87 y.o. with above-mentioned history of right breast cancer who underwent a lumpectomy, radiation, and is currently on antiestrogen  therapy with anastrozole. She presents to the clinic today for follow-up.   ALLERGIES:  is allergic to amoxicillin, atorvastatin, cefpodoxime proxetil, clarithromycin, fosamax [alendronate sodium], furosemide, and gabapentin.  MEDICATIONS:  Current Outpatient Medications  Medication Sig Dispense Refill   acetaminophen (TYLENOL) 500 MG tablet Take 1,000 mg by mouth every 6 (six) hours as needed.     anastrozole (ARIMIDEX) 1 MG tablet TAKE 1 TABLET EVERY DAY 90 tablet 3   aspirin EC 81 MG tablet Take 81 mg by mouth daily.       Cholecalciferol (VITAMIN D-3) 1000 UNITS CAPS Take 1,000 Units by mouth daily.      diltiazem (CARDIZEM CD) 120 MG 24 hr capsule Take 1 capsule (120 mg total) by mouth daily. 90 capsule 3   Docusate Sodium (EQ STOOL SOFTENER PO) Take 100 mg by mouth daily as needed (constipation).      doxylamine, Sleep, (UNISOM) 25 MG tablet Take 25 mg by mouth at bedtime as needed for sleep.      hydrochlorothiazide (HYDRODIURIL) 25 MG tablet Take 1 tablet (25 mg total) by mouth daily. 90 tablet 3   nitroGLYCERIN (NITROSTAT) 0.4 MG SL tablet Place 1 tablet (0.4 mg total) under the tongue every 5 (five) minutes as needed for chest pain (max 3 doses in 15 minutes). 25 tablet 3   polyethylene glycol (MIRALAX / GLYCOLAX) packet Take 17 g by mouth daily.      potassium chloride (KLOR-CON) 10 MEQ tablet Take 1 tablet (10 mEq total) by mouth daily. 90 tablet 3   traMADol (ULTRAM) 50 MG  tablet Take 1 tablet (50 mg total) by mouth every 6 (six) hours as needed. 30 tablet 0   vitamin B-12 (CYANOCOBALAMIN) 1000 MCG tablet Take 1,000 mcg by mouth daily.     No current facility-administered medications for this visit.    PHYSICAL EXAMINATION: ECOG PERFORMANCE STATUS: 1 - Symptomatic but completely ambulatory  Vitals:   04/17/21 1417  BP: (!) 183/88  Pulse: 63  Resp: 18  Temp: 97.7 F (36.5 C)  SpO2: 96%   Filed Weights   04/17/21 1417  Weight: 146 lb 4.8 oz (66.4 kg)    BREAST: No  palpable masses or nodules in either right or left breasts. No palpable axillary supraclavicular or infraclavicular adenopathy no breast tenderness or nipple discharge. (exam performed in the presence of a chaperone)  LABORATORY DATA:  I have reviewed the data as listed CMP Latest Ref Rng & Units 01/09/2021 12/27/2019 11/16/2019  Glucose 70 - 99 mg/dL 98 129(H) 107(H)  BUN 6 - 23 mg/dL '15 12 20  ' Creatinine 0.40 - 1.20 mg/dL 0.91 0.85 0.87  Sodium 135 - 145 mEq/L 141 139 141  Potassium 3.5 - 5.1 mEq/L 3.9 3.8 3.7  Chloride 96 - 112 mEq/L 101 100 103  CO2 19 - 32 mEq/L '31 26 29  ' Calcium 8.4 - 10.5 mg/dL 10.3 9.8 9.8  Total Protein 6.0 - 8.3 g/dL 7.4 - -  Total Bilirubin 0.2 - 1.2 mg/dL 0.7 - -  Alkaline Phos 39 - 117 U/L 103 - -  AST 0 - 37 U/L 17 - -  ALT 0 - 35 U/L 11 - -    Lab Results  Component Value Date   WBC 6.2 01/09/2021   HGB 13.8 01/09/2021   HCT 42.3 01/09/2021   MCV 87.3 01/09/2021   PLT 190.0 01/09/2021   NEUTROABS 3.3 01/09/2021    ASSESSMENT & PLAN:  Carcinoma of upper-outer quadrant of right breast in female, estrogen receptor positive ([place]) 04/25/2019:Routine screening mammogram detected a 1.2cm mass in the right breast. Biopsy showed invasive mammary carcinoma with mammary carcinoma in situ, HER-2 - (1+), ER+ 100%, PR+ 5% Ki67 2%.  T1c N0 stage Ia clinical stage   Recommendations: 1.  Right lumpectomy 04/07/2019: Grade 2 IDC 1.3 cm with intermediate grade DCIS, margins negative, negative for lymphovascular or perineural invasion, ER 100%, PR 5%, HER-2 -1+, Ki-67 2%, T1c Nx stage Ia 2. did not recommend adjuvant radiation therapy  3. Adjuvant antiestrogen therapy with anastrozole started 04/13/2019 discontinued April 17, 2021 due to joint inflammation   Anastrozole toxicities: Severe pain in the hands with inflammation of the ligaments and tendons are most likely related to anastrozole.    Breast cancer surveillance: 1.  Mammogram at [facility] 03/05/2021: Benign  breast density category C 2. breast exam 04/17/2021: Benign   Return to clinic in 1 year for follow-up with long-term survivorship      No orders of the defined types were placed in this encounter.  The patient has a good understanding of the overall plan. she agrees with it. she will call with any problems that may develop before the next visit here.  Total time spent:  mins including face to face time and time spent for planning, charting and coordination of care  Rulon Eisenmenger, MD, MPH 04/17/2021  I, Thana Ates, am acting as scribe for Dr. Nicholas Lose.  I have reviewed the above documentation for accuracy and completeness, and I agree with the above.

## 2021-04-17 ENCOUNTER — Other Ambulatory Visit: Payer: Self-pay

## 2021-04-17 ENCOUNTER — Inpatient Hospital Stay: Payer: Medicare HMO | Attending: Hematology and Oncology | Admitting: Hematology and Oncology

## 2021-04-17 DIAGNOSIS — Z79811 Long term (current) use of aromatase inhibitors: Secondary | ICD-10-CM | POA: Insufficient documentation

## 2021-04-17 DIAGNOSIS — Z79899 Other long term (current) drug therapy: Secondary | ICD-10-CM | POA: Diagnosis not present

## 2021-04-17 DIAGNOSIS — Z7982 Long term (current) use of aspirin: Secondary | ICD-10-CM | POA: Diagnosis not present

## 2021-04-17 DIAGNOSIS — Z170 Estrogen receptor positive status [ER+]: Secondary | ICD-10-CM | POA: Diagnosis not present

## 2021-04-17 DIAGNOSIS — Z923 Personal history of irradiation: Secondary | ICD-10-CM | POA: Insufficient documentation

## 2021-04-17 DIAGNOSIS — C50411 Malignant neoplasm of upper-outer quadrant of right female breast: Secondary | ICD-10-CM | POA: Insufficient documentation

## 2021-04-17 NOTE — Assessment & Plan Note (Signed)
04/25/2019:Routine screening mammogram detected a 1.2cm mass in the right breast. Biopsy showed invasive mammary carcinoma with mammary carcinoma in situ, HER-2 - (1+), ER+ 100%, PR+ 5% Ki67 2%. T1c N0 stage Ia clinical stage  Recommendations: 1.Right lumpectomy 04/07/2019: Grade 2 IDC 1.3 cm with intermediate grade DCIS, margins negative, negative for lymphovascular or perineural invasion, ER 100%, PR 5%, HER-2 -1+, Ki-67 2%, T1c Nxstage Ia 2.did not recommend adjuvant radiation therapy  3. Adjuvant antiestrogen therapy with anastrozole started 04/13/2019  Anastrozole toxicities: Denies any major adverse effects. She does have back pain as well as groin pain but it is related to arthritis.  Osteoporosis: I would like to get another bone density test.  She gets her mammograms at [facility]. I will request [facility] to get a bone density test on her.  Breast cancer surveillance: 1.  Mammogram at [facility] 03/05/2021: Benign breast density category C 2. breast exam 04/17/2021: Benign  Return to clinic in 1 year for follow-up

## 2021-04-19 ENCOUNTER — Ambulatory Visit (INDEPENDENT_AMBULATORY_CARE_PROVIDER_SITE_OTHER): Payer: Medicare HMO

## 2021-04-19 DIAGNOSIS — I2510 Atherosclerotic heart disease of native coronary artery without angina pectoris: Secondary | ICD-10-CM

## 2021-04-19 DIAGNOSIS — I10 Essential (primary) hypertension: Secondary | ICD-10-CM

## 2021-04-19 DIAGNOSIS — I5032 Chronic diastolic (congestive) heart failure: Secondary | ICD-10-CM

## 2021-04-19 NOTE — Patient Instructions (Signed)
Visit Information:  Thank you for taking the time to speak with me today.  Your next follow up appointment with the RN case manager: May 14, 2021 at 11:30am   Patient Goals/Self-Care Activities: Patient will attend all scheduled provider appointments as evidenced by clinician review of documented attendance to scheduled appointments and patient/caregiver report Patient will call pharmacy for medication refills as evidenced by patient report and review of pharmacy fill history as appropriate Patient will call provider office for new concerns or questions as evidenced by review of documented incoming telephone call notes and patient report do ankle pumps when sitting keep legs up while sitting - begin an exercise program  Report symptoms to your doctor immediately:  gaining 3 pounds in one day or 5 pounds in one week,  increase shortness of breath, swelling in feet, ankles, legs or your waistband gets tight.   Continue to follow a low salt diet plan  weigh daily and check your blood pressure at least 1 -2 times per week.  Record your readings for your weight and blood pressure. ( Get scales for home use) Take your medications as prescribed.  Review exercise booklet sent to you in the mail by your RN case manager.    The patient verbalized understanding of instructions, educational materials, and care plan provided today and agreed to receive a mailed copy of patient instructions, educational materials, and care plan.   The patient has been provided with contact information for the care management team and has been advised to call with any health related questions or concerns.  The care management team will reach out to the patient is May 14, 2021 at 11:30 am.   Quinn Plowman RN,BSN,CCM RN Case Manager [place]  [phone]

## 2021-04-19 NOTE — Chronic Care Management (AMB) (Signed)
Chronic Care Management   CCM RN Visit Note  04/19/2021 Name: Sara [name] MRN: [id number] DOB: August 15, 1931  Subjective: Sara [name] is a 87 y.o. year old female who is a primary care patient of Tower, Wynelle Fanny, MD. The care management team was consulted for assistance with disease management and care coordination needs.    Engaged with patient by telephone for follow up visit in response to provider referral for case management and/or care coordination services.   Consent to Services:  The patient was given information about Chronic Care Management services, agreed to services, and gave verbal consent prior to initiation of services.  Please see initial visit note for detailed documentation.   Patient agreed to services and verbal consent obtained.   Assessment: Review of patient past medical history, allergies, medications, health status, including review of consultants reports, laboratory and other test data, was performed as part of comprehensive evaluation and provision of chronic care management services.   SDOH (Social Determinants of Health) assessments and interventions performed:    CCM Care Plan  Allergies  Allergen Reactions   Amoxicillin     Reaction not known   Atorvastatin     aching   Cefpodoxime Proxetil     Reaction not known   Clarithromycin     Reaction not known   Fosamax [Alendronate Sodium]     Body pain    Furosemide     REACTION: doesn't help   Gabapentin Swelling    Caused severe swellling    Outpatient Encounter Medications as of 04/19/2021  Medication Sig   acetaminophen (TYLENOL) 500 MG tablet Take 1,000 mg by mouth every 6 (six) hours as needed.   aspirin EC 81 MG tablet Take 81 mg by mouth daily.     Cholecalciferol (VITAMIN D-3) 1000 UNITS CAPS Take 1,000 Units by mouth daily.    diltiazem (CARDIZEM CD) 120 MG 24 hr capsule Take 1 capsule (120 mg total) by mouth daily.   Docusate Sodium (EQ STOOL SOFTENER PO) Take 100  mg by mouth daily as needed (constipation).    doxylamine, Sleep, (UNISOM) 25 MG tablet Take 25 mg by mouth at bedtime as needed for sleep.    hydrochlorothiazide (HYDRODIURIL) 25 MG tablet Take 1 tablet (25 mg total) by mouth daily.   nitroGLYCERIN (NITROSTAT) 0.4 MG SL tablet Place 1 tablet (0.4 mg total) under the tongue every 5 (five) minutes as needed for chest pain (max 3 doses in 15 minutes).   polyethylene glycol (MIRALAX / GLYCOLAX) packet Take 17 g by mouth daily.    potassium chloride (KLOR-CON) 10 MEQ tablet Take 1 tablet (10 mEq total) by mouth daily.   traMADol (ULTRAM) 50 MG tablet Take 1 tablet (50 mg total) by mouth every 6 (six) hours as needed.   vitamin B-12 (CYANOCOBALAMIN) 1000 MCG tablet Take 1,000 mcg by mouth daily.   No facility-administered encounter medications on file as of 04/19/2021.    Patient Active Problem List   Diagnosis Date Noted   Aortic root enlargement ([place]) 01/09/2021   Neck pain 01/18/2020   Left shoulder pain 01/18/2020   Pain in both upper arms 01/18/2020   Thyroid nodule 01/04/2020   Flank pain 11/22/2019   Hand pain, right 10/18/2019   Family history of breast cancer    Family history of prostate cancer    Family history of lung cancer    Carcinoma of upper-outer quadrant of right breast in female, estrogen receptor positive ([place]) 02/23/2019   Pre-operative clearance  02/21/2019   History of breast cancer 02/21/2019   Medicare annual wellness visit, subsequent 01/05/2019   Encounter for screening mammogram for breast cancer 01/05/2019   Knee pain, left 10/14/2018   Arthritis of left hip 07/29/2018   Bradycardia 07/01/2018   Chronic diastolic heart failure ([place]) 12/27/2017   Lumbar spinal stenosis 02/72/5366   Diastolic dysfunction 44/08/4740   Estrogen deficiency 12/16/2016   Routine general medical examination at a health care facility 12/16/2016   CAD (coronary artery disease) 12/04/2016   Mucocele, 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   Eczema  03/06/2015   Prediabetes 02/11/2014   Intertrigo labialis 12/07/2012   CONSTIPATION, CHRONIC 07/03/2010   Osteoporosis 06/24/2008   HYPERCHOLESTEROLEMIA 12/08/2006   Essential hypertension 12/08/2006   GERD 12/08/2006   H/O herpes zoster 12/08/2006    Conditions to be addressed/monitored:CHF and HTN   Care Plan : RN  Care Management Plan of care  Updates made by Dannielle Karvonen, RN since 04/19/2021 12:00 AM     Problem: Disease Progression (Heart Failure and hypertension)   Priority: High     Long-Range Goal: Health Optimized for Heart failure and hypertension   Start Date: 04/19/2021  Expected End Date: 08/07/2021  Priority: High  Note:   Current Barriers:  Knowledge Deficits related to plan of care for management of CHF and HTN  Chronic Disease Management support and education needs related to CHF and HTN Patient reports having follow up visit with cardiologist on 03/19/2021.  Per visit chart review blood pressure was 183/80 at office visit.  Patient states her blood pressure are not high at home. She states her blood pressures seem to increase when she is at the doctors office. Home report blood pressure readings:  109/61, 114/66, 125/68, 147/84.  Patient states she gets tired easily and has some shortness of breath at times. She states she reported this to her cardiologist and was advised to start doing some light exercises.  RNCM discussed with patient light exercise options/ discussed chair exercises. Patient states she has not gotten a scale to weigh. She states her weight usually stays consistent.  Reports weight at doctors visit on yesterday was 148 lbs.  RNCM discussed importance of daily weights in managing heart failure.  Patient verbalized understanding.  Patient states she had a follow up visit with her oncologist on 04/17/2021. She states she was having ongoing symptoms of hand burning and pain. She reports her oncologist discontinued her anestrozole medication to see if it  would help to decrease hand symptoms.    RNCM Clinical Goal(s):  Patient will verbalize understanding of plan for management of CHF and HTN take all medications exactly as prescribed and will call provider for medication related questions attend all scheduled medical appointments:  continue to work with RN Care Manager to address care management and care coordination needs related to  CHF and HTN through collaboration with RN Care manager, provider, and care team.   Interventions: 1:1 collaboration with primary care provider regarding development and update of comprehensive plan of care as evidenced by provider attestation and co-signature Inter-disciplinary care team collaboration (see longitudinal plan of care) Evaluation of current treatment plan related to  self management and patient's adherence to plan as established by provider  Hypertension Interventions:  Goal on Track Last practice recorded BP readings:  BP Readings from Last 3 Encounters:  04/17/21 (!) 183/88  03/19/21 (!) 180/80  02/02/21 (!) 144/82  Most recent eGFR/CrCl: No results found for: EGFR  No components found for: CRCL  Evaluation  of current treatment plan related to hypertension self management and patient's adherence to plan as established by provider; Provided education to patient re: stroke prevention, s/s of heart attack and stroke; Reviewed medications with patient and discussed importance of compliance; Discussed plans with patient for ongoing care management follow up and provided patient with direct contact information for care management team; Advised patient, providing education and rationale, to monitor blood pressure daily and record, calling PCP for findings outside established parameters;  Reviewed scheduled/upcoming provider appointments including:  Heart Failure Interventions: Goal on track Provided education on low sodium diet; Reviewed Heart Failure Action Plan in depth and provided written  copy; Assessed need for readable accurate scales in home; Discussed importance of daily weight and advised patient to weigh and record daily; Discussed the importance of keeping all appointments with provider; Provided patient with education about the role of exercise in the management of heart failure;  Mailed patient exercise booklet  Patient Goals/Self-Care Activities: Patient will attend all scheduled provider appointments as evidenced by clinician review of documented attendance to scheduled appointments and patient/caregiver report Patient will call pharmacy for medication refills as evidenced by patient report and review of pharmacy fill history as appropriate Patient will call provider office for new concerns or questions as evidenced by review of documented incoming telephone call notes and patient report do ankle pumps when sitting keep legs up while sitting - begin an exercise program  Report symptoms to your doctor immediately:  gaining 3 pounds in one day or 5 pounds in one week,  increase shortness of breath, swelling in feet, ankles, legs or your waistband gets tight.   Continue to follow a low salt diet plan  weigh daily and check your blood pressure at least 1 -2 times per week.  Record your readings for your weight and blood pressure. ( Get scales for home use) Take your medications as prescribed.  Review exercise booklet sent to you in the mail by your RN case manager.        Plan:The patient has been provided with contact information for the care management team and has been advised to call with any health related questions or concerns.  The care management team will reach out to the patient again over the next 1 month . Quinn Plowman RN,BSN,CCM RN Case Manager [place]  [phone]

## 2021-05-09 DIAGNOSIS — I5032 Chronic diastolic (congestive) heart failure: Secondary | ICD-10-CM

## 2021-05-09 DIAGNOSIS — I110 Hypertensive heart disease with heart failure: Secondary | ICD-10-CM | POA: Diagnosis not present

## 2021-05-09 DIAGNOSIS — I2510 Atherosclerotic heart disease of native coronary artery without angina pectoris: Secondary | ICD-10-CM

## 2021-05-14 ENCOUNTER — Ambulatory Visit (INDEPENDENT_AMBULATORY_CARE_PROVIDER_SITE_OTHER): Payer: Medicare HMO

## 2021-05-14 DIAGNOSIS — I5032 Chronic diastolic (congestive) heart failure: Secondary | ICD-10-CM

## 2021-05-14 DIAGNOSIS — I10 Essential (primary) hypertension: Secondary | ICD-10-CM

## 2021-05-14 NOTE — Chronic Care Management (AMB) (Signed)
Chronic Care Management   CCM RN Visit Note  05/14/2021 Name: Sara [name] MRN: [id number] DOB: 1932/06/06  Subjective: Sara [name] is a 87 y.o. year old female who is a primary care patient of Tower, Wynelle Fanny, MD. The care management team was consulted for assistance with disease management and care coordination needs.    Engaged with patient by telephone for follow up visit in response to provider referral for case management and/or care coordination services.   Consent to Services:  The patient was given information about Chronic Care Management services, agreed to services, and gave verbal consent prior to initiation of services.  Please see initial visit note for detailed documentation.   Patient agreed to services and verbal consent obtained.   Assessment: Review of patient past medical history, allergies, medications, health status, including review of consultants reports, laboratory and other test data, was performed as part of comprehensive evaluation and provision of chronic care management services.   SDOH (Social Determinants of Health) assessments and interventions performed:    CCM Care Plan  Allergies  Allergen Reactions   Amoxicillin     Reaction not known   Atorvastatin     aching   Cefpodoxime Proxetil     Reaction not known   Clarithromycin     Reaction not known   Fosamax [Alendronate Sodium]     Body pain    Furosemide     REACTION: doesn't help   Gabapentin Swelling    Caused severe swellling    Outpatient Encounter Medications as of 05/14/2021  Medication Sig   acetaminophen (TYLENOL) 500 MG tablet Take 1,000 mg by mouth every 6 (six) hours as needed.   aspirin EC 81 MG tablet Take 81 mg by mouth daily.     Cholecalciferol (VITAMIN D-3) 1000 UNITS CAPS Take 1,000 Units by mouth daily.    diltiazem (CARDIZEM CD) 120 MG 24 hr capsule Take 1 capsule (120 mg total) by mouth daily.   Docusate Sodium (EQ STOOL SOFTENER PO) Take 100 mg  by mouth daily as needed (constipation).    doxylamine, Sleep, (UNISOM) 25 MG tablet Take 25 mg by mouth at bedtime as needed for sleep.    hydrochlorothiazide (HYDRODIURIL) 25 MG tablet Take 1 tablet (25 mg total) by mouth daily.   nitroGLYCERIN (NITROSTAT) 0.4 MG SL tablet Place 1 tablet (0.4 mg total) under the tongue every 5 (five) minutes as needed for chest pain (max 3 doses in 15 minutes).   polyethylene glycol (MIRALAX / GLYCOLAX) packet Take 17 g by mouth daily.    potassium chloride (KLOR-CON) 10 MEQ tablet Take 1 tablet (10 mEq total) by mouth daily.   traMADol (ULTRAM) 50 MG tablet Take 1 tablet (50 mg total) by mouth every 6 (six) hours as needed.   vitamin B-12 (CYANOCOBALAMIN) 1000 MCG tablet Take 1,000 mcg by mouth daily.   No facility-administered encounter medications on file as of 05/14/2021.    Patient Active Problem List   Diagnosis Date Noted   Aortic root enlargement ([place]) 01/09/2021   Neck pain 01/18/2020   Left shoulder pain 01/18/2020   Pain in both upper arms 01/18/2020   Thyroid nodule 01/04/2020   Flank pain 11/22/2019   Hand pain, right 10/18/2019   Family history of breast cancer    Family history of prostate cancer    Family history of lung cancer    Carcinoma of upper-outer quadrant of right breast in female, estrogen receptor positive ([place]) 02/23/2019   Pre-operative clearance  02/21/2019   History of breast cancer 02/21/2019   Medicare annual wellness visit, subsequent 01/05/2019   Encounter for screening mammogram for breast cancer 01/05/2019   Knee pain, left 10/14/2018   Arthritis of left hip 07/29/2018   Bradycardia 07/01/2018   Chronic diastolic heart failure ([place]) 12/27/2017   Lumbar spinal stenosis 95/28/4132   Diastolic dysfunction 44/06/270   Estrogen deficiency 12/16/2016   Routine general medical examination at a health care facility 12/16/2016   CAD (coronary artery disease) 12/04/2016   Mucocele, 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   Priority: High     Long-Range Goal: Development of plan of care to address chronic disease management and/ or care coordination needs.   Start Date: 04/19/2021  Expected End Date: 08/07/2021  Priority: High  Note:   Current Barriers:  Knowledge Deficits related to plan of care for management of CHF and HTN  Chronic Disease Management support and education needs related to CHF and HTN Patient reports she is doing " pretty good."  Reports blood pressure readings:  113/69, 127/72, 137/77, 133/75 and 126/71.  Patient states she still gets tired easily and has some shortness of breath at times.  She states her doctor is aware of this.  RNCM advised patient to pace herself during the day with rest breaks and do more active work/ activities during the times she has more energy. Patient verbalized agreement and understanding.  Patient denies having any additional follow up appointment through the end of the year.  Patient states she still has not obtained a scale to weigh daily.  RNCM continues to encourage patient to obtain scale to weigh. Discussed importance of daily weights in home heart failure management.  Discussed monitoring for swelling in feet/ ankles legs, around waist and in hands. Advised to call doctor for increase in swelling, shortness of breath, general malaise.   Patient verbalized understanding.  She continues to reports ongoing symptoms  of hand burning and pain. She reports her oncologist  discontinued her anestrozole medication to see if it would help to decrease hand symptoms.    RNCM Clinical Goal(s):  Patient will verbalize understanding of plan for management of CHF and HTN take [name] medications exactly as prescribed and will call provider for medication related questions attend [name] scheduled medical appointments:  continue to work with RN Care Manager to address care management and care coordination needs related to  CHF and HTN through collaboration with RN Care manager, provider, and care team.   Interventions: 1:1 collaboration with primary care provider regarding development and update of comprehensive plan of care as evidenced by provider attestation and co-signature Inter-disciplinary care team collaboration (see longitudinal plan of care) Evaluation of current treatment plan related to  self management and patient's adherence to plan as established by provider  Hypertension Interventions:  Goal on Track Last practice recorded BP readings:  BP Readings from Last 3 Encounters:  04/17/21 (!) 183/88  03/19/21 (!) 180/80  02/02/21 (!) 144/82  Most recent eGFR/CrCl: No results  found for: EGFR  No components found for: CRCL  Evaluation of current treatment plan related to hypertension self management and patient's adherence to plan as established by provider; Provided education to patient re: stroke prevention, s/s of heart attack and stroke; Reviewed medications with patient and discussed importance of compliance; Discussed plans with patient for ongoing care management follow up and provided patient with direct contact information for care management team; Advised patient, providing education and rationale, to monitor blood pressure daily and record, calling PCP for findings outside established parameters;  Reviewed scheduled/upcoming provider appointments including:  Heart Failure Interventions: Goal on track Provided education on low sodium diet; Reviewed Heart  Failure Action Plan in depth and provided written copy; Assessed need for readable accurate scales in home; Discussed importance of daily weight and advised patient to weigh and record daily; Discussed the importance of keeping [name] appointments with provider; Provided patient with education about the role of exercise in the management of heart failure;   Patient Goals/Self-Care Activities: Patient will attend [name] scheduled provider appointments as evidenced by clinician review of documented attendance to scheduled appointments and patient/caregiver report Patient will call pharmacy for medication refills as evidenced by patient report and review of pharmacy fill history as appropriate Patient will call provider office for new concerns or questions as evidenced by review of documented incoming telephone call notes and patient report do ankle pumps when sitting keep legs up while sitting - begin an exercise program  Report symptoms to your doctor immediately:  gaining 3 pounds in one day or 5 pounds in one week,  increase shortness of breath, swelling in feet, ankles, legs or your waistband gets tight.   Continue to follow a low salt diet plan  Consider obtaining scales and weighing daily and check your blood pressure at least 1 -2 times per week.  Record your readings for your weight and blood pressure. Continue to take your medications as prescribed.        Plan:The patient has been provided with contact information for the care management team and has been advised to call with any health related questions or concerns.  The care management team will reach out to the patient again over the next 2-3 months. Quinn Plowman RN,BSN,CCM RN Case Manager [place]  [phone]

## 2021-05-14 NOTE — Patient Instructions (Signed)
Visit Information  Thank you for taking time to visit with me today. Please don't hesitate to contact me if I can be of assistance to you before our next scheduled telephone appointment.  Patient Goals/Self-Care Activities: Patient will attend all scheduled provider appointments as evidenced by clinician review of documented attendance to scheduled appointments and patient/caregiver report Patient will call pharmacy for medication refills as evidenced by patient report and review of pharmacy fill history as appropriate Patient will call provider office for new concerns or questions as evidenced by review of documented incoming telephone call notes and patient report do ankle pumps when sitting keep legs up while sitting - begin an exercise program  Report symptoms to your doctor immediately:  gaining 3 pounds in one day or 5 pounds in one week,  increase shortness of breath, swelling in feet, ankles, legs or your waistband gets tight.   Continue to follow a low salt diet plan  Consider obtaining scales and weighing daily and check your blood pressure at least 1 -2 times per week.  Record your readings for your weight and blood pressure. Continue to take your medications as prescribed.   Our next appointment is by telephone on July 24, 2021 at 10:00 am.  Please call the care guide team at [phone] if you need to cancel or reschedule your appointment.   If you are experiencing a Mental Health or [place] or need someone to talk to, please call the Suicide and Crisis Lifeline: 988 call 1-800-273-TALK (toll free, 24 hour hotline)   The patient verbalized understanding of instructions, educational materials, and care plan provided today and agreed to receive a mailed copy of patient instructions, educational materials, and care plan.   Quinn Plowman RN,BSN,CCM RN Case Manager [place]  [phone]

## 2021-05-24 ENCOUNTER — Telehealth: Payer: Medicare HMO

## 2021-06-09 DIAGNOSIS — I10 Essential (primary) hypertension: Secondary | ICD-10-CM

## 2021-06-09 DIAGNOSIS — I5032 Chronic diastolic (congestive) heart failure: Secondary | ICD-10-CM

## 2021-06-20 NOTE — Telephone Encounter (Signed)
At this point I encourage her to check in with oncology and see what they recommend, less options from there but they may have an idea

## 2021-06-20 NOTE — Telephone Encounter (Signed)
Spoke with patient today to follow up. Patient saw her oncologist in November 2022 but did not discuss Prolia injection. Discussed this again with patient. Patient states she knows her daughter had some muscle/joint pain from this and was told not to use it. Patient is not wanting to add any more issues from what she is already dealing with. Would like to know if there is something else she can take?

## 2021-06-22 NOTE — Telephone Encounter (Signed)
Patient notified as instructed by telephone and verbalized understanding. 

## 2021-07-24 ENCOUNTER — Ambulatory Visit (INDEPENDENT_AMBULATORY_CARE_PROVIDER_SITE_OTHER): Payer: Medicare HMO

## 2021-07-24 DIAGNOSIS — W19XXXS Unspecified fall, sequela: Secondary | ICD-10-CM

## 2021-07-24 DIAGNOSIS — I5032 Chronic diastolic (congestive) heart failure: Secondary | ICD-10-CM

## 2021-07-24 DIAGNOSIS — I10 Essential (primary) hypertension: Secondary | ICD-10-CM

## 2021-07-24 NOTE — Patient Instructions (Signed)
Visit Information  Thank you for taking time to visit with me today. Please don't hesitate to contact me if I can be of assistance to you before our next scheduled telephone appointment.  Following are the goals we discussed today:  Patient will attend all scheduled provider appointments as evidenced by clinician review of documented attendance to scheduled appointments and patient/caregiver report Patient will call pharmacy for medication refills as evidenced by patient report and review of pharmacy fill history as appropriate Patient will call provider office for new concerns or questions as evidenced by review of documented incoming telephone call notes and patient report do ankle pumps when sitting keep legs up while sitting - begin an exercise program  Report symptoms to your doctor immediately:  gaining 3 pounds in one day or 5 pounds in one week,  increase shortness of breath, swelling in feet, ankles, legs or your waistband gets tight.   Continue to follow a low salt diet plan  Consider obtaining scales and weighing daily and check your blood pressure at least 1 -2 times per week.  Record your readings for your weight and blood pressure. Continue to take your medications as prescribed.  Review fall prevention article sent to you in the mail ( Make sure there is good lighting throughout your home, keep walkways clear of clutter, cords and throw rugs, use assistive device ( cane) if advised by your doctor.  Our next appointment is by telephone on 09/24/2021 at 10 am  Please call the care guide team at [phone] if you need to cancel or reschedule your appointment.   If you are experiencing a Mental Health or [place] or need someone to talk to, please call the Suicide and Crisis Lifeline: 988 call 1-800-273-TALK (toll free, 24 hour hotline)   The patient verbalized understanding of instructions, educational materials, and care plan provided today and agreed to receive  a mailed copy of patient instructions, educational materials, and care plan.   Quinn Plowman RN,BSN,CCM RN Case Manager [place]  [phone]

## 2021-07-24 NOTE — Chronic Care Management (AMB) (Signed)
Chronic Care Management   CCM RN Visit Note  07/24/2021 Name: Sara [name] MRN: [id number] DOB: 15-Sep-1931  Subjective: Sara [name] is a 88 y.o. year old female who is a primary care patient of Tower, Wynelle Fanny, MD. The care management team was consulted for assistance with disease management and care coordination needs.    Engaged with patient by telephone for follow up visit in response to provider referral for case management and/or care coordination services.   Consent to Services:  The patient was given information about Chronic Care Management services, agreed to services, and gave verbal consent prior to initiation of services.  Please see initial visit note for detailed documentation.   Patient agreed to services and verbal consent obtained.   Assessment: Review of patient past medical history, allergies, medications, health status, including review of consultants reports, laboratory and other test data, was performed as part of comprehensive evaluation and provision of chronic care management services.   SDOH (Social Determinants of Health) assessments and interventions performed:    CCM Care Plan  Allergies  Allergen Reactions   Amoxicillin     Reaction not known   Atorvastatin     aching   Cefpodoxime Proxetil     Reaction not known   Clarithromycin     Reaction not known   Fosamax [Alendronate Sodium]     Body pain    Furosemide     REACTION: doesn't help   Gabapentin Swelling    Caused severe swellling    Outpatient Encounter Medications as of 07/24/2021  Medication Sig   acetaminophen (TYLENOL) 500 MG tablet Take 1,000 mg by mouth every 6 (six) hours as needed.   aspirin EC 81 MG tablet Take 81 mg by mouth daily.     Cholecalciferol (VITAMIN D-3) 1000 UNITS CAPS Take 1,000 Units by mouth daily.    diltiazem (CARDIZEM CD) 120 MG 24 hr capsule Take 1 capsule (120 mg total) by mouth daily.   Docusate Sodium (EQ STOOL SOFTENER PO) Take 100 mg  by mouth daily as needed (constipation).    doxylamine, Sleep, (UNISOM) 25 MG tablet Take 25 mg by mouth at bedtime as needed for sleep.    hydrochlorothiazide (HYDRODIURIL) 25 MG tablet Take 1 tablet (25 mg total) by mouth daily.   nitroGLYCERIN (NITROSTAT) 0.4 MG SL tablet Place 1 tablet (0.4 mg total) under the tongue every 5 (five) minutes as needed for chest pain (max 3 doses in 15 minutes).   polyethylene glycol (MIRALAX / GLYCOLAX) packet Take 17 g by mouth daily.    potassium chloride (KLOR-CON) 10 MEQ tablet Take 1 tablet (10 mEq total) by mouth daily.   traMADol (ULTRAM) 50 MG tablet Take 1 tablet (50 mg total) by mouth every 6 (six) hours as needed.   vitamin B-12 (CYANOCOBALAMIN) 1000 MCG tablet Take 1,000 mcg by mouth daily.   No facility-administered encounter medications on file as of 07/24/2021.    Patient Active Problem List   Diagnosis Date Noted   Aortic root enlargement ([place]) 01/09/2021   Neck pain 01/18/2020   Left shoulder pain 01/18/2020   Pain in both upper arms 01/18/2020   Thyroid nodule 01/04/2020   Flank pain 11/22/2019   Hand pain, right 10/18/2019   Family history of breast cancer    Family history of prostate cancer    Family history of lung cancer    Carcinoma of upper-outer quadrant of right breast in female, estrogen receptor positive ([place]) 02/23/2019   Pre-operative clearance  02/21/2019   History of breast cancer 02/21/2019   Medicare annual wellness visit, subsequent 01/05/2019   Encounter for screening mammogram for breast cancer 01/05/2019   Knee pain, left 10/14/2018   Arthritis of left hip 07/29/2018   Bradycardia 07/01/2018   Chronic diastolic heart failure ([place]) 12/27/2017   Lumbar spinal stenosis 31/49/7026   Diastolic dysfunction 37/85/8850   Estrogen deficiency 12/16/2016   Routine general medical examination at a health care facility 12/16/2016   CAD (coronary artery disease) 12/04/2016   Mucocele, 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   Priority: High     Long-Range Goal: Development of plan of care to address chronic disease management and/ or care coordination needs.   Start Date: 04/19/2021  Expected End Date: 10/05/2021  Priority: High  Note:   Current Barriers:  Knowledge Deficits related to plan of care for management of CHF and HTN  Chronic Disease Management support and education needs related to CHF and HTN Patient reports she is doing " pretty good."  Reports blood pressure readings:  139/76, 147/76, 110/62, 137/67, 125/68.  Patient states she takes her medications as prescribed.  Patient states she sustained a fall approximately 1 week ago.  She states she was trying to step on a bug and fell in her home.  She reports she did not go to the ED or report fall to her doctor. She denies any severe injury.  She states she is having a little more back pain than usual.  Patient states her daughter is taking her to see the orthopedic doctor tomorrow 07/25/21.  Patient states she has not obtained a scale to weigh herself.  She denies any increase shortness of breath or swelling.  Patient reports she routinely deals with some fatigue and occasional shortness of breath.  Per chart review patients last appointment with her cardiologist was 03/19/21.  Patient advised at that time to follow up with cardiology in 1 year.  RNCM discussed with patient heart failure action plan/ zone.  Patient advised to notify doctor for mild/  moderate symptoms and call 911 for severe symptoms. Patient voiced understanding. Patient reports being in the [place] zone to day of her heart failure action plan.     RNCM Clinical Goal(s):  Patient will verbalize understanding of plan for management of CHF and HTN take all medications exactly as prescribed and will call provider for medication related questions attend all scheduled medical appointments:  continue to work with RN Care Manager to address care management and care coordination needs related to  CHF and HTN through collaboration with RN Care manager, provider, and care team.  Patient will obtain scales and weigh daily and record  Interventions: 1:1 collaboration with primary care provider regarding development and update of comprehensive plan of care as evidenced by provider attestation and co-signature Inter-disciplinary care team collaboration (see longitudinal plan of care) Evaluation of current treatment plan related to  self management and patient's adherence to plan as established by provider  Hypertension Interventions:  Goal on Track:  Yes Long term goal  Last practice recorded BP readings:  BP Readings from Last 3 Encounters:  04/17/21 (!) 183/88  03/19/21 (!) 180/80  02/02/21 (!) 144/82  Most recent eGFR/CrCl: No results found for: EGFR  No components found for: CRCL  Evaluation of current treatment plan related to hypertension self management and patient's adherence to plan as established by provider; Provided education to patient re: stroke prevention, s/s of heart attack and stroke; Reviewed medications with patient and discussed importance of compliance; Discussed plans with patient for ongoing care management follow up and provided patient with direct contact information for care management team; Advised patient, providing education and rationale, to monitor blood pressure 2-3 times per week and record, calling PCP for findings outside established parameters;   Reviewed scheduled/upcoming provider appointments   Heart Failure Interventions: Goal on track Yes Long term Goal Provided education on low sodium diet; Reviewed Heart Failure Action Plan in depth and provided written copy; Assessed need for readable accurate scales in home; Discussed importance of daily weight and advised patient to weigh and record daily; Discussed the importance of keeping all appointments with provider; Provided patient with education about the role of exercise in the management of heart failure;    Falls Interventions:  (Status:  New goal.) Short Term Goal Provided written and verbal education re: potential causes of falls and Fall prevention strategies Reviewed medications and discussed potential side effects of medications such as dizziness and frequent urination Advised patient of importance of notifying provider of falls Assessed patients knowledge of fall risk prevention secondary to previously provided education   Patient Goals/Self-Care Activities: Patient will attend all scheduled provider appointments as evidenced by clinician review of documented attendance to scheduled appointments and patient/caregiver report Patient will call pharmacy for medication refills as evidenced by patient report and review of pharmacy fill history as appropriate Patient will call provider office for new concerns or questions as evidenced by review of documented incoming telephone call notes and patient report do ankle pumps when sitting keep legs up while sitting - begin an exercise program  Report symptoms to your doctor immediately:  gaining 3 pounds in one day or 5 pounds in one week,  increase shortness of breath, swelling in feet, ankles, legs or your waistband gets tight.   Continue to follow a low salt diet plan  Consider obtaining scales and weighing daily and check your blood pressure at least 1 -2 times per week.  Record your readings for your weight and blood  pressure. Continue to take your medications as prescribed.  Review fall prevention article sent to you in the mail ( Make sure there is good lighting throughout your home, keep walkways clear of clutter, cords and throw rugs, use assistive device ( cane) if advised by your doctor.          Plan:The patient has been provided with contact information for the care management team and has been advised to call with any health related questions or concerns.  The care management team will reach out to the patient again over the next 2 months . Quinn Plowman RN,BSN,CCM RN Case Manager [place]  [phone]

## 2021-07-25 ENCOUNTER — Ambulatory Visit (INDEPENDENT_AMBULATORY_CARE_PROVIDER_SITE_OTHER): Payer: Medicare HMO

## 2021-07-25 ENCOUNTER — Encounter: Payer: Self-pay | Admitting: Physician Assistant

## 2021-07-25 ENCOUNTER — Other Ambulatory Visit: Payer: Self-pay

## 2021-07-25 ENCOUNTER — Ambulatory Visit: Payer: Medicare HMO | Admitting: Physician Assistant

## 2021-07-25 DIAGNOSIS — M79641 Pain in right hand: Secondary | ICD-10-CM | POA: Diagnosis not present

## 2021-07-25 DIAGNOSIS — M79642 Pain in left hand: Secondary | ICD-10-CM | POA: Diagnosis not present

## 2021-07-25 DIAGNOSIS — M545 Low back pain, unspecified: Secondary | ICD-10-CM

## 2021-07-25 DIAGNOSIS — M542 Cervicalgia: Secondary | ICD-10-CM | POA: Diagnosis not present

## 2021-07-25 NOTE — Progress Notes (Signed)
Office Visit Note   Patient: Sara [name]           Date of Birth: 1931-11-05           MRN: [id number] Visit Date: 07/25/2021              Requested by: Tower, Wynelle Fanny, MD [place],  [place] [address] PCP: Abner Greenspan, MD   Assessment & Plan: Visit Diagnoses:  1. Acute bilateral low back pain without sciatica   2. Neck pain   3. Bilateral hand pain     Plan:  Discussed with her that we recommend therapy for her neck and her low back.  We will send her for physical therapy to work on range of motion strengthening neck back, stretching, modalities and to gain a home exercise program.  Due to the fact that she has numbness tingling in her hands that is waking her recommend EMG nerve conduction studies to rule out carpal tunnel syndrome.  Discussed with her at length that her clinical exam points to median nerve involvement.  She could definitely be having peripheral neuropathy due to her previous Arimidex usage for breast cancer .  We will see her back in just 4 weeks see how she is doing in regards to her neck and back and also to go over the EMG nerve conduction studies.  Questions were encouraged and answered at length.  Patient is prediabetic.  Follow-Up Instructions: Return After EMG nerve conduction studies approximately 4 weeks.   Orders:  Orders Placed This Encounter  Procedures   XR Cervical Spine 2 or 3 views   XR Lumbar Spine 2-3 Views   No orders of the defined types were placed in this encounter.     Procedures: No procedures performed   Clinical Data: No additional findings.   Subjective: Chief Complaint  Patient presents with   Neck - Pain   Lower Back - Pain    HPI [name] is a 88 year old female well-known to Dr. Ninfa Linden service.  She comes in today due to neck and low back pain for couple weeks.  She states she was trying to kill a bug on her floor and landed on the floor.  She denies any loss of  consciousness or dizziness.  She ranks her back pain to be 8-9 out of 10 pain at worst and is worse with standing.  She denies any radicular symptoms down either leg.  Difficulty with turning over in the bed.  Denies any saddle anesthesia like symptoms bowel or bladder dysfunction.  She is taken Tylenol for this which helps some.  She is also having neck pain since the fall she has pain lateral aspects of the neck bilaterally.  No radicular symptoms.  She has had tingling in her fingers since 2020 is been told that it is due to her  Arimidex that she was taking due to breast cancer she has been off of it since November she still having waking numbness tingling in her hand right hand slightly worse than the left. Review of Systems See HPI otherwise negative  Objective: Vital Signs: There were no vitals taken for this visit.  Physical Exam General: Well-developed well-nourished female no acute distress mood and affect appropriate Psych: Alert and oriented x3 Vascular calves are supple nontender bilaterally dorsal pedal pulses are 2+ bilaterally radial pulses are 2+ bilaterally. Ortho Exam Cervical spine full flexion limited extension pain with rotation left right.  Negative Spurling's.  Tenderness trapezius region both shoulders and medial borders of scapula bilaterally.  5 5 strength throughout the upper extremities against resistance full motor bilateral hands.  Positive Tinel's over the median nerve at the wrist bilaterally compression test also positive over the median nerve at the wrist bilaterally Phalen's is positive bilaterally.  Subjective decrease sensation throughout the median nerve distribution of both hands. Lumbar spine: No tenderness over the spinal column with palpation.  No tenderness over the paraspinous region bilaterally.  Positive straight leg raise bilaterally.  Out of 5 strength throughout the lower extremities against resistance.  Subjective decrease sensation in the left foot  lateral aspect. Specialty Comments:  No specialty comments available.  Imaging: XR Cervical Spine 2 or 3 views  Result Date: 07/25/2021 Cervical spine 2 views: No acute fractures.  Diffuse degenerative changes with anterior endplate spurring.  This space overall well-maintained.  Loss of lordotic curvature.  XR Lumbar Spine 2-3 Views  Result Date: 07/25/2021 Lumbar spine 2 views: No acute findings.  No acute fractures.  Status post L4-5 fusion with no hardware failure.  Slight retrolisthesis at L2-3 unchanged from prior imaging studies.  Otherwise mild degenerative changes.    PMFS History: Patient Active Problem List   Diagnosis Date Noted   Aortic root enlargement ([place]) 01/09/2021   Neck pain 01/18/2020   Left shoulder pain 01/18/2020   Pain in both upper arms 01/18/2020   Thyroid nodule 01/04/2020   Flank pain 11/22/2019   Hand pain, right 10/18/2019   Family history of breast cancer    Family history of prostate cancer    Family history of lung cancer    Carcinoma of upper-outer quadrant of right breast in female, estrogen receptor positive ([place]) 02/23/2019   Pre-operative clearance 02/21/2019   History of breast cancer 02/21/2019   Medicare annual wellness visit, subsequent 01/05/2019   Encounter for screening mammogram for breast cancer 01/05/2019   Knee pain, left 10/14/2018   Arthritis of left hip 07/29/2018   Bradycardia 07/01/2018   Chronic diastolic heart failure ([place]) 12/27/2017   Lumbar spinal stenosis 93/79/0240   Diastolic dysfunction 97/35/3299   Estrogen deficiency 12/16/2016   Routine general medical examination at a health care facility 12/16/2016   CAD (coronary artery disease) 12/04/2016   Mucocele, 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   Eczema 03/06/2015   Prediabetes 02/11/2014   Intertrigo labialis 12/07/2012   CONSTIPATION, CHRONIC 07/03/2010   Osteoporosis 06/24/2008   HYPERCHOLESTEROLEMIA 12/08/2006   Essential hypertension 12/08/2006   GERD 12/08/2006    H/O herpes zoster 12/08/2006   Past Medical History:  Diagnosis Date   Allergy    CAD (coronary artery disease)    Chronic diastolic (congestive) heart failure (HCC)    Clinical trial participant    [facility] Genetic studies in familial dementia   Dyspnea 11/28/2016   Family history of breast cancer    Family history of lung cancer    Family history of prostate cancer    Frequent UTI    GERD (gastroesophageal reflux disease)    Hypertension    Osteoarthritis    hips, back   Osteopenia    Pneumonia    Spinal stenosis    Stroke ([place]) 1985    Family History  Problem Relation Age of Onset   Lung cancer Brother 42   Other Mother        kidney tumor   Hypertension Mother    Diabetes Sister    Diabetes Brother    Diabetes Sister  x 2   Colon cancer Maternal Uncle    Other Sister        intestine burst   Breast cancer Maternal Aunt        x 2   Breast cancer Daughter        unknown cancer   Diabetes Son        x 3   Diabetes Daughter    Breast cancer Sister        x2, diagnosed in 83s and 43s   Prostate cancer Maternal Uncle    Esophageal cancer Neg Hx    Rectal cancer Neg Hx    Stomach cancer Neg Hx     Past Surgical History:  Procedure Laterality Date   ABDOMINAL HYSTERECTOMY     partial has one ovary   APPENDECTOMY     BREAST LUMPECTOMY WITH RADIOACTIVE SEED LOCALIZATION Right 04/07/2019   Procedure: RIGHT BREAST LUMPECTOMY WITH RADIOACTIVE SEED LOCALIZATION;  Surgeon: Jovita Kussmaul, MD;  Location: [facility];  Service: General;  Laterality: Right;   CATARACT EXTRACTION Bilateral    LEFT HEART CATH AND CORONARY ANGIOGRAPHY N/A 11/29/2016   Procedure: Left Heart Cath and Coronary Angiography;  Surgeon: Sherren Mocha, MD;  Location: [facility];  Service: Cardiovascular;  Laterality: N/A;   LUMBAR DISC SURGERY     x 3   rotator cuff surgery Right 2010   Social History   Occupational History   Occupation: RETIRED    Employer: RETIRED  Tobacco  Use   Smoking status: Never   Smokeless tobacco: Never  Vaping Use   Vaping Use: Never used  Substance and Sexual Activity   Alcohol use: No    Alcohol/week: 0.0 standard drinks   Drug use: No   Sexual activity: Not on file

## 2021-07-25 NOTE — Addendum Note (Signed)
Addended by: Robyne Peers on: 07/25/2021 03:56 PM   Modules accepted: Orders

## 2021-08-07 DIAGNOSIS — I10 Essential (primary) hypertension: Secondary | ICD-10-CM | POA: Diagnosis not present

## 2021-08-07 DIAGNOSIS — I5032 Chronic diastolic (congestive) heart failure: Secondary | ICD-10-CM | POA: Diagnosis not present

## 2021-08-14 ENCOUNTER — Ambulatory Visit: Payer: Medicare HMO | Attending: Physician Assistant

## 2021-08-14 ENCOUNTER — Other Ambulatory Visit: Payer: Self-pay

## 2021-08-14 DIAGNOSIS — G8929 Other chronic pain: Secondary | ICD-10-CM | POA: Diagnosis not present

## 2021-08-14 DIAGNOSIS — R2681 Unsteadiness on feet: Secondary | ICD-10-CM | POA: Diagnosis not present

## 2021-08-14 DIAGNOSIS — M6281 Muscle weakness (generalized): Secondary | ICD-10-CM | POA: Insufficient documentation

## 2021-08-14 DIAGNOSIS — M25542 Pain in joints of left hand: Secondary | ICD-10-CM | POA: Diagnosis not present

## 2021-08-14 DIAGNOSIS — R2689 Other abnormalities of gait and mobility: Secondary | ICD-10-CM | POA: Diagnosis not present

## 2021-08-14 DIAGNOSIS — M545 Low back pain, unspecified: Secondary | ICD-10-CM | POA: Diagnosis not present

## 2021-08-14 DIAGNOSIS — M25541 Pain in joints of right hand: Secondary | ICD-10-CM | POA: Insufficient documentation

## 2021-08-14 NOTE — Therapy (Signed)
OUTPATIENT PHYSICAL THERAPY THORACOLUMBAR EVALUATION   Patient Name: Sara [name] MRN: [id number] DOB:[birth year], 88 y.o., female Today's Date: 08/15/2021   PT End of Session - 08/14/21 1332     Visit Number 1    Number of Visits 17    Date for PT Re-Evaluation 10/10/21    Authorization Type [employer]    PT Start Time 0347    PT Stop Time 1128    PT Time Calculation (min) 43 min    Activity Tolerance Patient tolerated treatment well    Behavior During Therapy WFL for tasks assessed/performed             Past Medical History:  Diagnosis Date   Allergy    CAD (coronary artery disease)    Chronic diastolic (congestive) heart failure ([place])    Clinical trial participant    [facility] Genetic studies in familial dementia   Dyspnea 11/28/2016   Family history of breast cancer    Family history of lung cancer    Family history of prostate cancer    Frequent UTI    GERD (gastroesophageal reflux disease)    Hypertension    Osteoarthritis    hips, back   Osteopenia    Pneumonia    Spinal stenosis    Stroke ([place]) 1985   Past Surgical History:  Procedure Laterality Date   ABDOMINAL HYSTERECTOMY     partial has one ovary   APPENDECTOMY     BREAST LUMPECTOMY WITH RADIOACTIVE SEED LOCALIZATION Right 04/07/2019   Procedure: RIGHT BREAST LUMPECTOMY WITH RADIOACTIVE SEED LOCALIZATION;  Surgeon: Jovita Kussmaul, MD;  Location: [place];  Service: General;  Laterality: Right;   CATARACT EXTRACTION Bilateral    LEFT HEART CATH AND CORONARY ANGIOGRAPHY N/A 11/29/2016   Procedure: Left Heart Cath and Coronary Angiography;  Surgeon: Sherren Mocha, MD;  Location: [facility];  Service: Cardiovascular;  Laterality: N/A;   LUMBAR DISC SURGERY     x 3   rotator cuff surgery Right 2010   Patient Active Problem List   Diagnosis Date Noted   Aortic root enlargement ([place]) 01/09/2021   Neck pain 01/18/2020   Left shoulder pain 01/18/2020   Pain in both upper arms  01/18/2020   Thyroid nodule 01/04/2020   Flank pain 11/22/2019   Hand pain, right 10/18/2019   Family history of breast cancer    Family history of prostate cancer    Family history of lung cancer    Carcinoma of upper-outer quadrant of right breast in female, estrogen receptor positive ([place]) 02/23/2019   Pre-operative clearance 02/21/2019   History of breast cancer 02/21/2019   Medicare annual wellness visit, subsequent 01/05/2019   Encounter for screening mammogram for breast cancer 01/05/2019   Knee pain, left 10/14/2018   Arthritis of left hip 07/29/2018   Bradycardia 07/01/2018   Chronic diastolic heart failure ([place]) 12/27/2017   Lumbar spinal stenosis 42/59/5638   Diastolic dysfunction 75/64/3329   Estrogen deficiency 12/16/2016   Routine general medical examination at a health care facility 12/16/2016   CAD (coronary artery disease) 12/04/2016   Mucocele, 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   Eczema 03/06/2015   Prediabetes 02/11/2014   Intertrigo labialis 12/07/2012   CONSTIPATION, CHRONIC 07/03/2010   Osteoporosis 06/24/2008   HYPERCHOLESTEROLEMIA 12/08/2006   Essential hypertension 12/08/2006   GERD 12/08/2006   H/O herpes zoster 12/08/2006    PCP: Abner Greenspan, MD  REFERRING PROVIDER: Pete Pelt, PA-C  REFERRING DIAG: M54.50 (ICD-10-CM) - Acute bilateral  low back pain without sciatica  THERAPY DIAG:  Chronic low back pain, unspecified back pain laterality, unspecified whether sciatica present  Muscle weakness (generalized)  Other abnormalities of gait and mobility  Unsteadiness on feet  Pain in joints of left hand  Pain in joints of right hand  ONSET DATE: February 2023  SUBJECTIVE:                                                                                                                                                                                           SUBJECTIVE STATEMENT: Pt presents to PT with reports of lower back pain after fall in early  February. She also has chronic hx of bilateral hand pain and numbness/tingling, she says MD believes this this is a side effect due to cancer medication. She is R hand dominant and notes that grasping smaller objects such as silverware and coffee mugs has become difficult. Denies bowel/bladder changes or saddle anesthesia. Pt also denies paresthesias in LE, is mainly concerned about her bilateral hand pain/paresthesia/swelling. Has an NCV study this week to determine possible carpal tunnel contributions. Has only had one fall in last few months, but feels she has become fairly unsteady, ambulating with SPC. She also loves baking cakes and would like to get back to doing this as she has had to stop due to her hand impairments.   PERTINENT HISTORY:  Lumbar surgeries, HTN, CHF, cancer  PAIN:  Are you having pain? Yes NPRS scale: 8/10 Pain location: lower back and bilateral hips L>R; 10/10 pain at worst in hands and lower back PAIN TYPE: aching Pain description: intermittent  Aggravating factors: prolonged standing,  Relieving factors: heat  PRECAUTIONS: Fall  WEIGHT BEARING RESTRICTIONS No  FALLS:  Has patient fallen in last 6 months? 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  No acute fractures.  Status post  L4-5 fusion with no hardware failure.  Slight retrolisthesis at L2-3  unchanged from prior imaging studies.  Otherwise mild degenerative  changes.  Cervical spine 2 views: No  acute fractures.  Diffuse degenerative changes  with anterior endplate spurring.  This space overall well-maintained.    Loss of lordotic curvature.    PATIENT SURVEYS:  FOTO - will assess next session  COGNITION:  Overall cognitive status: Within functional limits for tasks assessed     SENSATION:  Light touch: Appears intact  POSTURE:  Small body habitus; fwd head, rounded shoulders  PALPATION: TTP to L lumbar paraspinals, bilateral hands  LUMBARAROM/PROM  A/PROM A/PROM  08/15/2021  Flexion Increased pain; limited  Extension Limited; decreased pain   (Blank rows = not tested)  LE MMT:  MMT Right 08/15/2021 Left 08/15/2021  Hip flexion  3/5 3/5  Hip extension    Hip abduction 3/5 3/5  Hip adduction 3/5 3/5  Hip external rotation    Hip internal rotation    Knee extension 4/5 4/5  Knee flexion 3+/5 3+/5  Ankle dorsiflexion     Ankle plantarflexion    Ankle inversion    Ankle eversion    Grossly    (Blank rows = not tested) Grip strength 18# bilaterally  LUMBAR SPECIAL TESTS:  Straight leg raise test: Negative and Slump test: Negative  FUNCTIONAL TESTS:  30 Second Sit to Stand: 6 reps  GAIT: Distance walked: 9f Assistive device utilized: Single point cane Level of assistance: Modified independence Comments: short step length, slow gait speed  TODAY'S TREATMENT  [facility] Adult PT Treatment: DATE: 08/14/2021 Therapeutic Exercise: Bridge x 10  Supine hip abd x 10 RTB Supine ball squeeze x 10 - 5" hold LTR x 10 Standing lumbar ext x 5  PATIENT EDUCATION:  Education details: eval findings, HEP, POC Person educated: Patient Education method: Explanation, Demonstration, and Handouts Education comprehension: verbalized understanding and returned demonstration   HOME EXERCISE PROGRAM: Access Code: [email or web address] URL: [email or web address] Date: 08/14/2021 Prepared by: DOctavio Manns Exercises Supine Bridge - 1 x daily - 7 x weekly - 3 sets - 10  reps Supine Lower Trunk Rotation - 1 x daily - 7 x weekly - 2 sets - 10 reps Hooklying Clamshell with Resistance - 1 x daily - 7 x weekly - 3 sets - 10 reps Supine Hip Adduction Isometric with Ball - 1 x daily - 7 x weekly - 3 sets - 10 reps Standing Lumbar Extension - 1 x daily - 7 x weekly - 2 sets - 10 reps   ASSESSMENT:  CLINICAL IMPRESSION: Patient is a 88y.o. F who was seen today for physical therapy evaluation and treatment for lower back and bilateral hand pain along with impaired balance. Physical findings are consistent with referring provider impression, as she demonstrates marked decrease in proximal hip strength, decreased balance derived from 30 Second Sit to Stand test, and palpable increase in pain to L lumbar paraspinals. She would benefit from skilled PT services working on improving her core and proximal hip strength to decrease pain and balance for increasing safety. Will assess response to initial HEP and progress as able.     OBJECTIVE IMPAIRMENTS decreased activity tolerance, decreased balance, decreased endurance, difficulty walking, decreased ROM, decreased strength, and pain.   ACTIVITY LIMITATIONS community activity, occupation, and yard work.   PERSONAL FACTORS Age, Fitness, Time since onset of injury/illness/exacerbation, and 3+ comorbidities: Lumbar surgeries, HTN, CHF, cancer  are also affecting patient's functional outcome.    REHAB POTENTIAL: Good  CLINICAL DECISION MAKING: Evolving/moderate complexity  EVALUATION COMPLEXITY: Moderate   GOALS: Goals reviewed with patient? No  SHORT TERM GOALS:  Pt will be compliant and knowledgeable with initial HEP for improved comfort and carryover Baseline: initial  HEP given Target date: 09/05/2021 Goal status: INITIAL  2.  Pt will self report lower back pain no greater than 6/10 for improved comfort and functional ability Baseline: 10/10 at worst Target date: 09/05/2021 Goal status: INITIAL   LONG TERM  GOALS:  Pt will improve FOTO function score to no less than predicted score as proxy for functional improvement Baseline: will assess next session Target date: 09/05/2021  and 10/10/2021 Goal status: INITIAL   2.  Pt will self report lower back pain no greater than 3/10 for improved comfort and functional ability Baseline: 10/10 at worst Target date: 10/10/2021 Goal status: INITIAL  3.  Pt will improve bilateral hip MMT to no less than 4/5 for improved functional mobility Baseline: see chart Target date: 10/10/2021 Goal status: INITIAL  4.  Pt will increase 30 Second Sit to Stand reps to no less than 8 reps for improved functional strength and balance Baseline: 6 reps (MCID 2 reps) Target date: 10/10/2021 Goal status: INITIAL  PLAN: PT FREQUENCY: 1-2x/week  PT DURATION: 8 weeks  PLANNED INTERVENTIONS: Therapeutic exercises, Therapeutic activity, Neuromuscular re-education, Balance training, Gait training, Patient/Family education, Joint mobilization, Aquatic Therapy, Dry Needling, Cryotherapy, Moist heat, and Manual therapy  PLAN FOR NEXT SESSION: take FOTO, assess HEP response, progress core and hip strength, balance (obstacles, narrow BoS static stance)  Referring diagnosis?  M54.50 (ICD-10-CM) - Acute bilateral low back pain without sciatica Treatment diagnosis? (if different than referring diagnosis)  Chronic low back pain, unspecified back pain laterality, unspecified whether sciatica present Muscle weakness (generalized) Other abnormalities of gait and mobility Unsteadiness on feet Pain in joints of left hand Pain in joints of right hand  What was this (referring dx) caused by? '[]'$  Surgery '[]'$  Fall '[x]'$  Ongoing issue '[x]'$  Arthritis '[]'$  Other: ____________  Laterality: '[]'$  Rt '[]'$  Lt '[x]'$  Both  Check all possible CPT codes:  *CHOOSE 10 OR LESS*    '[x]'$  97110 (Therapeutic Exercise)  '[]'$  92507 (SLP Treatment)  '[x]'$  97112 (Neuro Re-ed)   '[]'$  92526 (Swallowing Treatment)   '[x]'$   97116 (Gait Training)   '[]'$  [id number] (Cognitive Training, 1st 15 minutes) '[x]'$  97140 (Manual Therapy)   '[]'$  97130 (Cognitive Training, each add'l 15 minutes)  '[x]'$  97530 (Therapeutic Activities)  '[]'$  Other, List CPT Code ____________    '[x]'$  97535 (Self Care)       '[]'$  All codes above (97110 - 97535)  '[]'$  97012 (Mechanical Traction)  '[]'$  97014 (E-stim Unattended)  '[]'$  97032 (E-stim manual)  '[]'$  97033 (Ionto)  '[]'$  97035 (Ultrasound)  '[]'$  97760 (Orthotic Fit) '[]'$  97750 (Physical Performance Training) '[x]'$  [id number] (Aquatic Therapy) '[]'$  97034 (Contrast Bath) '[]'$  [id number] (Paraffin) '[]'$  97597 (Wound Care 1st 20 sq cm) '[]'$  97598 (Wound Care each add'l 20 sq cm) '[]'$  97016 (Vasopneumatic Device) '[]'$  [id number] (Orthotic Training) '[]'$  [id number] (Prosthetic Training)   Ward Chatters, PT 08/15/2021, 9:41 AM

## 2021-08-16 DIAGNOSIS — Z170 Estrogen receptor positive status [ER+]: Secondary | ICD-10-CM | POA: Diagnosis not present

## 2021-08-16 DIAGNOSIS — C50411 Malignant neoplasm of upper-outer quadrant of right female breast: Secondary | ICD-10-CM | POA: Diagnosis not present

## 2021-08-17 ENCOUNTER — Encounter: Payer: Self-pay | Admitting: Physical Medicine and Rehabilitation

## 2021-08-17 ENCOUNTER — Ambulatory Visit (INDEPENDENT_AMBULATORY_CARE_PROVIDER_SITE_OTHER): Payer: Medicare HMO | Admitting: Physical Medicine and Rehabilitation

## 2021-08-17 ENCOUNTER — Other Ambulatory Visit: Payer: Self-pay

## 2021-08-17 DIAGNOSIS — R202 Paresthesia of skin: Secondary | ICD-10-CM

## 2021-08-17 NOTE — Progress Notes (Signed)
Pt state she has pain, tingling and numbness in both hands. Pt state the pain is worse at night and her hand falling asleep cause wakes her up. Pt state it hard for her to hold on to items. Pt state she takes over the counter pain meds and uses heat to help ease her pain. Pt state she is right handed. ? ?Numeric Pain Rating Scale and Functional Assessment ?Average Pain 5 ? ? ?In the last MONTH (on 0-10 scale) has pain interfered with the following? ? ?1. General activity like being  able to carry out your everyday physical activities such as walking, climbing stairs, carrying groceries, or moving a chair?  ?Rating(9) ? ? ? ? ?

## 2021-08-22 NOTE — Therapy (Signed)
?OUTPATIENT PHYSICAL THERAPY TREATMENT NOTE ? ? ?Patient Name: Sara [name] ?MRN: [id number] ?DOB:1931/10/29, 88 y.o., female ?Today's Date: 08/23/2021 ? ?PCP: Abner Greenspan, MD ?REFERRING PROVIDER: Pete Pelt, PA-C ? ? PT End of Session - 08/23/21 1212   ? ? Visit Number 2   ? Number of Visits 17   ? Date for PT Re-Evaluation 10/10/21   ? Authorization Type [employer]   ? PT Start Time 1215   ? PT Stop Time 1258   ? PT Time Calculation (min) 43 min   ? Activity Tolerance Patient tolerated treatment well   ? Behavior During Therapy [facility] for tasks assessed/performed   ? ?  ?  ? ?  ? ? ?Past Medical History:  ?Diagnosis Date  ? Allergy   ? CAD (coronary artery disease)   ? Chronic diastolic (congestive) heart failure ([facility])   ? Clinical trial participant   ? [facility] studies in familial dementia  ? Dyspnea 11/28/2016  ? Family history of breast cancer   ? Family history of lung cancer   ? Family history of prostate cancer   ? Frequent UTI   ? GERD (gastroesophageal reflux disease)   ? Hypertension   ? Osteoarthritis   ? hips, back  ? Osteopenia   ? Pneumonia   ? Spinal stenosis   ? Stroke [facility]) 1985  ? ?Past Surgical History:  ?Procedure Laterality Date  ? ABDOMINAL HYSTERECTOMY    ? partial has one ovary  ? APPENDECTOMY    ? BREAST LUMPECTOMY WITH RADIOACTIVE SEED LOCALIZATION Right 04/07/2019  ? Procedure: RIGHT BREAST LUMPECTOMY WITH RADIOACTIVE SEED LOCALIZATION;  Surgeon: Jovita Kussmaul, MD;  Location: [place];  Service: General;  Laterality: Right;  ? CATARACT EXTRACTION Bilateral   ? LEFT HEART CATH AND CORONARY ANGIOGRAPHY N/A 11/29/2016  ? Procedure: Left Heart Cath and Coronary Angiography;  Surgeon: Sherren Mocha, MD;  Location: [facility];  Service: Cardiovascular;  Laterality: N/A;  ? LUMBAR DISC SURGERY    ? x 3  ? rotator cuff surgery Right 2010  ? ?Patient Active Problem List  ? Diagnosis Date Noted  ? Aortic root enlargement ([place]) 01/09/2021  ? Neck pain 01/18/2020   ? Left shoulder pain 01/18/2020  ? Pain in both upper arms 01/18/2020  ? Thyroid nodule 01/04/2020  ? Flank pain 11/22/2019  ? Hand pain, right 10/18/2019  ? Family history of breast cancer   ? Family history of prostate cancer   ? Family history of lung cancer   ? Carcinoma of upper-outer quadrant of right breast in female, estrogen receptor positive ([place]) 02/23/2019  ? Pre-operative clearance 02/21/2019  ? History of breast cancer 02/21/2019  ? Medicare annual wellness visit, subsequent 01/05/2019  ? Encounter for screening mammogram for breast cancer 01/05/2019  ? Knee pain, left 10/14/2018  ? Arthritis of left hip 07/29/2018  ? Bradycardia 07/01/2018  ? Chronic diastolic heart failure ([place]) 12/27/2017  ? Lumbar spinal stenosis 03/11/2017  ? Diastolic dysfunction 27/08/5007  ? Estrogen deficiency 12/16/2016  ? Routine general medical examination at a health care facility 12/16/2016  ? CAD (coronary artery disease) 12/04/2016  ? 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 Eczema 03/06/2015  ? Prediabetes 02/11/2014  ? Intertrigo labialis 12/07/2012  ? CONSTIPATION, CHRONIC 07/03/2010  ? Osteoporosis 06/24/2008  ? HYPERCHOLESTEROLEMIA 12/08/2006  ? Essential hypertension 12/08/2006  ? GERD 12/08/2006  ? H/O herpes zoster 12/08/2006  ? ? ?REFERRING DIAG: M54.50 (ICD-10-CM) - Acute bilateral low  back pain without sciatica ? ?THERAPY DIAG:  ?Chronic low back pain, unspecified back pain laterality, unspecified whether sciatica present ? ?Muscle weakness (generalized) ? ?Other abnormalities of gait and mobility ? ?Unsteadiness on feet ? ?Pain in joints of left hand ? ?Pain in joints of right hand ? ?PERTINENT HISTORY: Lumbar surgeries, HTN, CHF, cancer ? ?PRECAUTIONS: Fall ? ?ONSET DATE: February 2023 ? ?SUBJECTIVE: I'm always tired. My shoulder hurts a little bit today. I took tylenol before I came in today. ? ?PAIN:  ?Are you having pain? Yes ?NPRS scale: 5/10 ?Pain location: lower back and bilateral hips L>R; 10/10 pain at  worst in hands and lower back ?PAIN TYPE: aching ?Pain description: intermittent  ?Aggravating factors: prolonged standing,  ?Relieving factors: heat ? ? ? ?OBJECTIVE:  ?  ?DIAGNOSTIC FINDINGS:  ?Lumbar spine 2 views: No acute findings.  No acute fractures.  Status post  ?L4-5 fusion with no hardware failure.  Slight retrolisthesis at L2-3  ?unchanged from prior imaging studies.  Otherwise mild degenerative  ?changes. ?  ?Cervical spine 2 views: No acute fractures.  Diffuse degenerative changes  ?with anterior endplate spurring.  This space overall well-maintained.    ?Loss of lordotic curvature.  ?  ?  ?PATIENT SURVEYS:  ?FOTO - will assess next session ?08/22/2021: 45% ?  ?COGNITION: ?         Overall cognitive status: Within functional limits for tasks assessed              ?          ?SENSATION: ?         Light touch: Appears intact ?  ?POSTURE:  ?Small body habitus; fwd head, rounded shoulders ?  ?PALPATION: ?TTP to L lumbar paraspinals, bilateral hands ?  ?LUMBARAROM/PROM ?  ?A/PROM A/PROM  ?08/15/2021  ?Flexion Increased pain; limited  ?Extension Limited; decreased pain  ? (Blank rows = not tested) ?  ?LE MMT: ?  ?MMT Right ?08/15/2021 Left ?08/15/2021  ?Hip flexion  3/5 3/5  ?Hip extension      ?Hip abduction 3/5 3/5  ?Hip adduction 3/5 3/5  ?Hip external rotation      ?Hip internal rotation      ?Knee extension 4/5 4/5  ?Knee flexion 3+/5 3+/5  ?Ankle dorsiflexion       ?Ankle plantarflexion      ?Ankle inversion      ?Ankle eversion      ?Grossly      ?(Blank rows = not tested) ?Grip strength 18# bilaterally ?  ?LUMBAR SPECIAL TESTS:  ?Straight leg raise test: Negative and Slump test: Negative ?  ?FUNCTIONAL TESTS:  ?30 Second Sit to Stand: 6 reps ?  ?GAIT: ?Distance walked: 60f ?Assistive device utilized: Single point cane ?Level of assistance: Modified independence ?Comments: short step length, slow gait speed ?  ?[facility]REAT[facility]  [facility]                                                [facility]  [facility] ?Therapeutic Exercise: ?Bridge 3" hold 2 x 10  ?Supine hip abd 2 x 10 RTB ?Supine ball squeeze 2 x 10 - 5" hold ?Seated march 2x20 ?STS x10 no UE support ?Neuromuscular re-ed: ?Romberg stance EO/EC x30" ?Romberg stance on airex 2x30" ?March on airex 2x20 ?Weaving around cones figure 8 pattern ?Stepping over cones (  cones laid on their side) ? ? ?[facility] Adult PT Treatment: DATE: 08/14/2021 ?Therapeutic Exercise: ?Bridge x 10  ?Supine hip abd x 10 RTB ?Supine ball squeeze x 10 - 5" hold ?LTR x 10 ?Standing lumbar ext x 5 ?  ?PATIENT EDUCATION:  ?Education details: eval findings, HEP, POC ?Person educated: Patient ?Education method: Explanation, Demonstration, and Handouts ?Education comprehension: verbalized understanding and returned demonstration ?  ?  ?HOME EXERCISE PROGRAM: ?Access Code: [email or web address] ?URL: [email or web address] ?Date: 08/14/2021 ?Prepared by: Octavio Manns ?  ?Exercises ?Supine Bridge - 1 x daily - 7 x weekly - 3 sets - 10 reps ?Supine Lower Trunk Rotation - 1 x daily - 7 x weekly - 2 sets - 10 reps ?Hooklying Clamshell with Resistance - 1 x daily - 7 x weekly - 3 sets - 10 reps ?Supine Hip Adduction Isometric with Ball - 1 x daily - 7 x weekly - 3 sets - 10 reps ?Standing Lumbar Extension - 1 x daily - 7 x weekly - 2 sets - 10 reps ?  ?  ?ASSESSMENT: ?  ?CLINICAL IMPRESSION: ?Patient presents to PT with moderate levels of lower back pain. She states the pain in her hands is caused by a cancer medication that she takes. Today's session focused on improving proximal hip and core strength as well as balance training. Patient continues to benefit from skilled PT services and should be progressed as able to improve functional independence.   ?  ?  ?OBJECTIVE IMPAIRMENTS decreased activity tolerance, decreased balance, decreased endurance, difficulty walking, decreased ROM, decreased strength, and pain.  ?  ?ACTIVITY LIMITATIONS community activity, occupation, and yard work.  ?  ?PERSONAL  FACTORS Age, Fitness, Time since onset of injury/illness/exacerbation, and 3+ comorbidities: Lumbar surgeries, HTN, CHF, cancer  are also affecting patient's functional outcome.  ?  ?  ?REHAB POTENTIAL

## 2021-08-23 ENCOUNTER — Other Ambulatory Visit: Payer: Self-pay

## 2021-08-23 ENCOUNTER — Ambulatory Visit: Payer: Medicare HMO

## 2021-08-23 DIAGNOSIS — R2689 Other abnormalities of gait and mobility: Secondary | ICD-10-CM

## 2021-08-23 DIAGNOSIS — M25541 Pain in joints of right hand: Secondary | ICD-10-CM | POA: Diagnosis not present

## 2021-08-23 DIAGNOSIS — R2681 Unsteadiness on feet: Secondary | ICD-10-CM | POA: Diagnosis not present

## 2021-08-23 DIAGNOSIS — M6281 Muscle weakness (generalized): Secondary | ICD-10-CM

## 2021-08-23 DIAGNOSIS — M545 Low back pain, unspecified: Secondary | ICD-10-CM | POA: Diagnosis not present

## 2021-08-23 DIAGNOSIS — M25542 Pain in joints of left hand: Secondary | ICD-10-CM

## 2021-08-23 DIAGNOSIS — G8929 Other chronic pain: Secondary | ICD-10-CM | POA: Diagnosis not present

## 2021-08-23 NOTE — Procedures (Signed)
EMG & NCV Findings: ?Evaluation of the left median motor and the right median motor nerves showed prolonged distal onset latency (L4.5, R4.8 ms) and decreased conduction velocity (Elbow-Wrist, L48, R47 m/s).  The left median (across palm) sensory nerve showed no response (Palm), prolonged distal peak latency (5.3 ms), and reduced amplitude (8.4 ?V).  The right median (across palm) sensory nerve showed no response (Palm) and prolonged distal peak latency (5.8 ms).  The left ulnar sensory nerve showed prolonged distal peak latency (3.8 ms) and decreased conduction velocity (Wrist-5th Digit, 37 m/s).  All remaining nerves (as indicated in the following tables) were within normal limits.  Left vs. Right side comparison data for the ulnar motor nerve indicates abnormal L-R amplitude difference (43.7 %) and abnormal L-R velocity difference (A Elbow-B Elbow, 28 m/s).  All remaining left vs. right side differences were within normal limits.   ? ?All examined muscles (as indicated in the following table) showed no evidence of electrical instability.   ? 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  ? ?There is no significant electrodiagnostic evidence of any other focal nerve entrapment, brachial plexopathy or cervical radiculopathy.  ? ?Recommendations: ?1.  Follow-up with referring physician. ?2.  Continue current management of symptoms. ?3.  Suggest surgical evaluation. ? ?___________________________ ?Sara [name] FAAPMR ?Board Certified, Tax adviser of Physical Medicine and Rehabilitation ? ? ? ?Nerve Conduction Studies ?Anti Sensory Summary Table ? ? Stim Site NR Peak (ms) Norm Peak (ms) P-T Amp (?V) Norm P-T Amp Site1 Site2 Delta-P (ms) Dist (cm) Vel (m/s) Norm Vel (m/s)  ?Left Median Acr Palm Anti Sensory (2nd Digit)  32.9?C  ?Wrist    *5.3 <3.6  *8.4 >10 Wrist Palm  0.0    ?Palm *NR  <2.0          ?Right Median Acr Palm Anti Sensory (2nd Digit)  31.8?C  ?Wrist    *5.8 <3.6 12.7 >10 Wrist Palm  0.0    ?Palm *NR  <2.0          ?Left Radial Anti Sensory (Base 1st Digit)  32.3?C  ?Wrist    2.4 <3.1 9.8  Wrist Base 1st Digit 2.4 0.0    ?Right Radial Anti Sensory (Base 1st Digit)  31.3?C  ?Wrist    2.3 <3.1 9.8  Wrist Base 1st Digit 2.3 0.0    ?Left Ulnar Anti Sensory (5th Digit)  33?C  ?Wrist    *3.8 <3.7 19.2 >15.0 Wrist 5th Digit 3.8 14.0 *37 >38  ?Right Ulnar Anti Sensory (5th Digit)  32?C  ?Wrist    3.6 <3.7 17.8 >15.0 Wrist 5th Digit 3.6 14.0 39 >38  ? ?Motor Summary Table ? ? Stim Site NR Onset (ms) Norm Onset (ms) O-P Amp (mV) Norm O-P Amp Site1 Site2 Delta-0 (ms) Dist (cm) Vel (m/s) Norm Vel (m/s)  ?Left Median Motor (Abd Poll Brev)  32.7?C  ?Wrist    *4.5 <4.2 6.3 >5 Elbow Wrist 4.2 20.0 *48 >50  ?Elbow    8.7  5.8         ?Right Median Motor (Abd Poll Brev)  31.8?C  ?Wrist    *4.8 <4.2 7.1 >5 Elbow Wrist 4.7 22.0 *47 >50  ?Elbow    9.5  6.6         ?Left Ulnar Motor (Abd Dig Min)  32.7?C  ?Wrist    3.0 <  4.2 4.0 >3 B Elbow Wrist 3.0 18.0 60 >53  ?B Elbow    6.0  6.3  A Elbow B Elbow 1.2 10.0 83 >53  ?A Elbow    7.2  5.4         ?Right Ulnar Motor (Abd Dig Min)  32.2?C  ?Wrist    3.0 <4.2 7.1 >3 B Elbow Wrist 3.4 18.0 53 >53  ?B Elbow    6.4  5.0  A Elbow B Elbow 0.9 10.0 111 >53  ?A Elbow    7.3  2.5         ? ?EMG ? ? Side Muscle Nerve Root Ins Act Fibs Psw Amp Dur Poly Recrt Int Fraser Din Comment  ?Right Abd Poll Brev Median C8-T1 Nml Nml Nml Nml Nml 0 Nml Nml   ?Right 1stDorInt Ulnar C8-T1 Nml Nml Nml Nml Nml 0 Nml Nml   ?Right PronatorTeres Median C6-7 Nml Nml Nml Nml Nml 0 Nml Nml   ?Right Biceps Musculocut C5-6 Nml Nml Nml Nml Nml 0 Nml Nml   ?Right Deltoid Axillary C5-6 Nml Nml Nml Nml Nml 0 Nml Nml   ? ? ?Nerve Conduction Studies ?Anti Sensory Left/Right Comparison ? ? Stim Site L Lat (ms) R Lat (ms) L-R Lat (ms) L Amp (?V) R Amp (?V) L-R Amp (%)  Site1 Site2 L Vel (m/s) R Vel (m/s) L-R Vel (m/s)  ?Median Acr Palm Anti Sensory (2nd Digit)  32.9?C  ?Wrist *5.3 *5.8 0.5 *8.4 12.7 33.9 Wrist Palm     ?Palm             ?Radial Anti Sensory (Base 1st Digit)  32.3?C  ?Wrist 2.4 2.3 0.1 9.8 9.8 0.0 Wrist Base 1st Digit     ?Ulnar Anti Sensory (5th Digit)  33?C  ?Wrist *3.8 3.6 0.2 19.2 17.8 7.3 Wrist 5th Digit *37 39 2  ? ?Motor Left/Right Comparison ? ? Stim Site L Lat (ms) R Lat (ms) L-R Lat (ms) L Amp (mV) R Amp (mV) L-R Amp (%) Site1 Site2 L Vel (m/s) R Vel (m/s) L-R Vel (m/s)  ?Median Motor (Abd Poll Brev)  32.7?C  ?Wrist *4.5 *4.8 0.3 6.3 7.1 11.3 Elbow Wrist *48 *47 1  ?Elbow 8.7 9.5 0.8 5.8 6.6 12.1       ?Ulnar Motor (Abd Dig Min)  32.7?C  ?Wrist 3.0 3.0 0.0 4.0 7.1 *43.7 B Elbow Wrist 60 53 7  ?B Elbow 6.0 6.4 0.4 6.3 5.0 20.6 A Elbow B Elbow 83 111 *28  ?A Elbow 7.2 7.3 0.1 5.4 2.5 53.7       ? ? ? ?Waveforms: ?    ? ?    ? ?    ? ?  ?

## 2021-08-23 NOTE — Progress Notes (Signed)
? ?Sara [name] - 88 y.o. female MRN [id number]  Date of birth: 08/23/1931 ? ?Office Visit Note: ?Visit Date: 08/17/2021 ?PCP: Abner Greenspan, MD ?Referred by: Pete Pelt, PA-C ? ?Subjective: ?Chief Complaint  ?Patient presents with  ? Right Hand - Pain, Numbness, Tingling  ? Left Hand - Pain, Numbness, Tingling  ? ?HPI:  Sara [name] is a 88 y.o. female who comes in today at the request of Benita Stabile, PA-C for electrodiagnostic study of the Bilateral upper extremities.  Patient is Right hand dominant.  She reports chronic pain numbness and tingling in both hands.  She does get worsening at night with nocturnal complaints of falling asleep.  She does have a positive flick sign.  She states it is getting hard for her to hold onto items and manipulate small objects.  She has not had prior electrodiagnostic studies.  No frank radicular symptoms.  History of remote stroke.  No diabetes noted. ? ?ROS Otherwise per HPI. ? ?Assessment & Plan: ?Visit Diagnoses:  ?  ICD-10-CM   ?1. Paresthesia of skin  R20.2 NCV with EMG (electromyography)  ?  ?  ?Plan: Impression: ?The above electrodiagnostic study is ABNORMAL and reveals evidence of a moderate to severe bilateral median nerve entrapment at the wrist (carpal tunnel syndrome) affecting sensory and motor components.  ? ?There is no significant electrodiagnostic evidence of any other focal nerve entrapment, brachial plexopathy or cervical radiculopathy.  ? ?Recommendations: ?1.  Follow-up with referring physician. ?2.  Continue current management of symptoms. ?3.  Suggest surgical evaluation. ? ?Meds & Orders: No orders of the defined types were placed in this encounter. ?  ?Orders Placed This Encounter  ?Procedures  ? NCV with EMG (electromyography)  ?  ?Follow-up: Return in about 2 weeks (around 08/31/2021) for Benita Stabile, P.A.-C.  ? ?Procedures: ?No procedures performed  ?EMG & NCV Findings: ?Evaluation of the left median motor and the right median  motor nerves showed prolonged distal onset latency (L4.5, R4.8 ms) and decreased conduction velocity (Elbow-Wrist, L48, R47 m/s).  The left median (across palm) sensory nerve showed no response (Palm), prolonged distal peak latency (5.3 ms), and reduced amplitude (8.4 ?V).  The right median (across palm) sensory nerve showed no response (Palm) and prolonged distal peak latency (5.8 ms).  The left ulnar sensory nerve showed prolonged distal peak latency (3.8 ms) and decreased conduction velocity (Wrist-5th Digit, 37 m/s).  All remaining nerves (as indicated in the following tables) were within normal limits.  Left vs. Right side comparison data for the ulnar motor nerve indicates abnormal L-R amplitude difference (43.7 %) and abnormal L-R velocity difference (A Elbow-B Elbow, 28 m/s).  All remaining left vs. right side differences were within normal limits.   ? ?All examined muscles (as indicated in the following table) showed no evidence of electrical instability.   ? 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  ? ?There is no significant electrodiagnostic evidence of any other focal nerve entrapment, brachial plexopathy or cervical radiculopathy.  ? ?Recommendations: ?1.  Follow-up with referring physician. ?2.  Continue current management of symptoms. ?3.  Suggest surgical evaluation. ? ?___________________________ ?Laurence Spates FAAPMR ?Board Certified, Tax adviser of Physical Medicine and Rehabilitation ? ? ? ?Nerve Conduction Studies ?Anti Sensory Summary Table ? ? Stim Site NR Peak (ms) Norm Peak (ms) P-T Amp (?V) Norm P-T  Amp Site1 Site2 Delta-P (ms) Dist (cm) Vel (m/s) Norm Vel (m/s)  ?Left Median Acr Palm Anti Sensory (2nd Digit)  32.9?C  ?Wrist    *5.3 <3.6 *8.4 >10 Wrist Palm  0.0    ?Palm *NR  <2.0          ?Right Median Acr Palm  Anti Sensory (2nd Digit)  31.8?C  ?Wrist    *5.8 <3.6 12.7 >10 Wrist Palm  0.0    ?Palm *NR  <2.0          ?Left Radial Anti Sensory (Base 1st Digit)  32.3?C  ?Wrist    2.4 <3.1 9.8  Wrist Base 1st Digit 2.4 0.0    ?Right Radial Anti Sensory (Base 1st Digit)  31.3?C  ?Wrist    2.3 <3.1 9.8  Wrist Base 1st Digit 2.3 0.0    ?Left Ulnar Anti Sensory (5th Digit)  33?C  ?Wrist    *3.8 <3.7 19.2 >15.0 Wrist 5th Digit 3.8 14.0 *37 >38  ?Right Ulnar Anti Sensory (5th Digit)  32?C  ?Wrist    3.6 <3.7 17.8 >15.0 Wrist 5th Digit 3.6 14.0 39 >38  ? ?Motor Summary Table ? ? Stim Site NR Onset (ms) Norm Onset (ms) O-P Amp (mV) Norm O-P Amp Site1 Site2 Delta-0 (ms) Dist (cm) Vel (m/s) Norm Vel (m/s)  ?Left Median Motor (Abd Poll Brev)  32.7?C  ?Wrist    *4.5 <4.2 6.3 >5 Elbow Wrist 4.2 20.0 *48 >50  ?Elbow    8.7  5.8         ?Right Median Motor (Abd Poll Brev)  31.8?C  ?Wrist    *4.8 <4.2 7.1 >5 Elbow Wrist 4.7 22.0 *47 >50  ?Elbow    9.5  6.6         ?Left Ulnar Motor (Abd Dig Min)  32.7?C  ?Wrist    3.0 <4.2 4.0 >3 B Elbow Wrist 3.0 18.0 60 >53  ?B Elbow    6.0  6.3  A Elbow B Elbow 1.2 10.0 83 >53  ?A Elbow    7.2  5.4         ?Right Ulnar Motor (Abd Dig Min)  32.2?C  ?Wrist    3.0 <4.2 7.1 >3 B Elbow Wrist 3.4 18.0 53 >53  ?B Elbow    6.4  5.0  A Elbow B Elbow 0.9 10.0 111 >53  ?A Elbow    7.3  2.5         ? ?EMG ? ? Side Muscle Nerve Root Ins Act Fibs Psw Amp Dur Poly Recrt Int Fraser Din Comment  ?Right Abd Poll Brev Median C8-T1 Nml Nml Nml Nml Nml 0 Nml Nml   ?Right 1stDorInt Ulnar C8-T1 Nml Nml Nml Nml Nml 0 Nml Nml   ?Right PronatorTeres Median C6-7 Nml Nml Nml Nml Nml 0 Nml Nml   ?Right Biceps Musculocut C5-6 Nml Nml Nml Nml Nml 0 Nml Nml   ?Right Deltoid Axillary C5-6 Nml Nml Nml Nml Nml 0 Nml Nml   ? ? ?Nerve Conduction Studies ?Anti Sensory Left/Right Comparison ? ? Stim Site L Lat (ms) R Lat (ms) L-R Lat (ms) L Amp (?V) R Amp (?V) L-R Amp (%) Site1 Site2 L Vel (m/s) R Vel (m/s) L-R Vel (m/s)  ?Median Acr Palm Anti Sensory  (2nd Digit)  32.9?C  ?Wrist *5.3 *5.8 0.5 *8.4 12.7 33.9 Wrist Palm     ?Palm             ?Radial Anti Sensory [facility]  1st Digit)  32.3?C  ?Wrist 2.4 2.3 0.1 9.8 9.8 0.0 Wrist Base 1st Digit     ?Ulnar Anti Sensory (5th Digit)  33?C  ?Wrist *3.8 3.6 0.2 19.2 17.8 7.3 Wrist 5th Digit *37 39 2  ? ?Motor Left/Right Comparison ? ? Stim Site L Lat (ms) R Lat (ms) L-R Lat (ms) L Amp (mV) R Amp (mV) L-R Amp (%) Site1 Site2 L Vel (m/s) R Vel (m/s) L-R Vel (m/s)  ?Median Motor (Abd Poll Brev)  32.7?C  ?Wrist *4.5 *4.8 0.3 6.3 7.1 11.3 Elbow Wrist *48 *47 1  ?Elbow 8.7 9.5 0.8 5.8 6.6 12.1       ?Ulnar Motor (Abd Dig Min)  32.7?C  ?Wrist 3.0 3.0 0.0 4.0 7.1 *43.7 B Elbow Wrist 60 53 7  ?B Elbow 6.0 6.4 0.4 6.3 5.0 20.6 A Elbow B Elbow 83 111 *28  ?A Elbow 7.2 7.3 0.1 5.4 2.5 53.7       ? ? ? ?Waveforms: ?    ? ?    ? ?    ? ?   ? ?Clinical History: ?No specialty comments available.  ? ? ? ?Objective:  VS:  HT:    WT:   BMI:     BP:   HR: bpm  TEMP: ( )  RESP:  ?Physical Exam ?Musculoskeletal:     ?   General: No swelling, tenderness or deformity.  ?   Comments: Inspection reveals no atrophy of the bilateral APB or FDI or hand intrinsics. There is no swelling, color changes, allodynia or dystrophic changes. There is 5 out of 5 strength in the bilateral wrist extension, finger abduction and long finger flexion. There is intact sensation to light touch in all dermatomal and peripheral nerve distributions. T There is a positive Phalen's test bilaterally. There is a negative Hoffmann's test bilaterally.  ?Skin: ?   General: Skin is warm and dry.  ?   Findings: No erythema or rash.  ?Neurological:  ?   General: No focal deficit present.  ?   Mental Status: She is alert and oriented to person, place, and time.  ?   Motor: No weakness or abnormal muscle tone.  ?   Coordination: Coordination normal.  ?Psychiatric:     ?   Mood and Affect: Mood normal.     ?   Behavior: Behavior normal.  ?  ? ?Imaging: ?No results found. ?

## 2021-08-24 ENCOUNTER — Ambulatory Visit: Payer: Medicare HMO

## 2021-08-30 ENCOUNTER — Ambulatory Visit: Payer: Medicare HMO

## 2021-08-30 ENCOUNTER — Other Ambulatory Visit: Payer: Self-pay

## 2021-08-30 DIAGNOSIS — M545 Low back pain, unspecified: Secondary | ICD-10-CM | POA: Diagnosis not present

## 2021-08-30 DIAGNOSIS — R2689 Other abnormalities of gait and mobility: Secondary | ICD-10-CM | POA: Diagnosis not present

## 2021-08-30 DIAGNOSIS — M6281 Muscle weakness (generalized): Secondary | ICD-10-CM | POA: Diagnosis not present

## 2021-08-30 DIAGNOSIS — M25541 Pain in joints of right hand: Secondary | ICD-10-CM | POA: Diagnosis not present

## 2021-08-30 DIAGNOSIS — M25542 Pain in joints of left hand: Secondary | ICD-10-CM | POA: Diagnosis not present

## 2021-08-30 DIAGNOSIS — R2681 Unsteadiness on feet: Secondary | ICD-10-CM | POA: Diagnosis not present

## 2021-08-30 DIAGNOSIS — G8929 Other chronic pain: Secondary | ICD-10-CM | POA: Diagnosis not present

## 2021-08-30 NOTE — Therapy (Addendum)
?OUTPATIENT PHYSICAL THERAPY TREATMENT NOTE/DISCHARGE ? ?PHYSICAL THERAPY DISCHARGE SUMMARY ? ?Visits from Start of Care: 3 ? ?Current functional level related to goals / functional outcomes: ?Unable to assess ?  ?Remaining deficits: ?Unable to assess ?  ?Education / Equipment: ?HEP  ? ?Patient agrees to discharge. Patient goals were  unable to assess . Patient is being discharged due to not returning since the last visit.  ? ?Patient Name: Sara [name] ?MRN: [id number] ?DOB:1931/12/01, 88 y.o., female ?Today's Date: 08/30/2021 ? ?PCP: Abner Greenspan, MD ?REFERRING PROVIDER: Pete Pelt, PA-C ? ? PT End of Session - 08/30/21 1030   ? ? Visit Number 3   ? Number of Visits 17   ? Date for PT Re-Evaluation 10/10/21   ? Authorization Type [employer]   ? PT Start Time 1040   ? PT Stop Time 1120   ? PT Time Calculation (min) 40 min   ? Activity Tolerance Patient tolerated treatment well   ? Behavior During Therapy [facility] for tasks assessed/performed   ? ?  ?  ? ?  ? ? ? ?Past Medical History:  ?Diagnosis Date  ? Allergy   ? CAD (coronary artery disease)   ? Chronic diastolic (congestive) heart failure (HCC)   ? Clinical trial participant   ? [facility] studies in familial dementia  ? Dyspnea 11/28/2016  ? Family history of breast cancer   ? Family history of lung cancer   ? Family history of prostate cancer   ? Frequent UTI   ? GERD (gastroesophageal reflux disease)   ? Hypertension   ? Osteoarthritis   ? hips, back  ? Osteopenia   ? Pneumonia   ? Spinal stenosis   ? Stroke [facility]) 1985  ? ?Past Surgical History:  ?Procedure Laterality Date  ? ABDOMINAL HYSTERECTOMY    ? partial has one ovary  ? APPENDECTOMY    ? BREAST LUMPECTOMY WITH RADIOACTIVE SEED LOCALIZATION Right 04/07/2019  ? Procedure: RIGHT BREAST LUMPECTOMY WITH RADIOACTIVE SEED LOCALIZATION;  Surgeon: Jovita Kussmaul, MD;  Location: [place];  Service: General;  Laterality: Right;  ? CATARACT EXTRACTION Bilateral   ? LEFT HEART CATH AND  CORONARY ANGIOGRAPHY N/A 11/29/2016  ? Procedure: Left Heart Cath and Coronary Angiography;  Surgeon: Sherren Mocha, MD;  Location: [place] [facility];  Service: Cardiovascular;  Laterality: N/A;  ? LUMBAR DISC SURGERY    ? x 3  ? rotator cuff surgery Right 2010  ? ?Patient Active Problem List  ? Diagnosis Date Noted  ? Aortic root enlargement ([place]) 01/09/2021  ? Neck pain 01/18/2020  ? Left shoulder pain 01/18/2020  ? Pain in both upper arms 01/18/2020  ? Thyroid nodule 01/04/2020  ? Flank pain 11/22/2019  ? Hand pain, right 10/18/2019  ? Family history of breast cancer   ? Family history of prostate cancer   ? Family history of lung cancer   ? Carcinoma of upper-outer quadrant of right breast in female, estrogen receptor positive ([place]) 02/23/2019  ? Pre-operative clearance 02/21/2019  ? History of breast cancer 02/21/2019  ? Medicare annual wellness visit, subsequent 01/05/2019  ? Encounter for screening mammogram for breast cancer 01/05/2019  ? Knee pain, left 10/14/2018  ? Arthritis of left hip 07/29/2018  ? Bradycardia 07/01/2018  ? Chronic diastolic heart failure ([place]) 12/27/2017  ? Lumbar spinal stenosis 03/11/2017  ? Diastolic dysfunction 35/59/7416  ? Estrogen deficiency 12/16/2016  ? Routine general medical examination at a health care facility  12/16/2016  ? CAD (coronary artery disease) 12/04/2016  ? 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 Eczema 03/06/2015  ? Prediabetes 02/11/2014  ? Intertrigo labialis 12/07/2012  ? CONSTIPATION, CHRONIC 07/03/2010  ? Osteoporosis 06/24/2008  ? HYPERCHOLESTEROLEMIA 12/08/2006  ? Essential hypertension 12/08/2006  ? GERD 12/08/2006  ? H/O herpes zoster 12/08/2006  ? ? ?REFERRING DIAG: M54.50 (ICD-10-CM) - Acute bilateral low back pain without sciatica ? ?THERAPY DIAG:  ?Chronic low back pain, unspecified back pain laterality, unspecified whether sciatica present ? ?Muscle weakness (generalized) ? ?PERTINENT HISTORY: Lumbar surgeries, HTN, CHF, cancer ? ?PRECAUTIONS:  Fall ? ?ONSET DATE: February 2023 ? ?SUBJECTIVE:  ?Pt presents to PT with reports of L sided lower back pain. Has been compliant with her HEP with no adverse effect. Pt is ready to begin PT at this time.  ? ?Pain: ?Are you having pain? Yes ?NPRS scale: 5/10 ?Pain location: lower back and bilateral hips L>R; 10/10 pain at worst in hands and lower back ?PAIN TYPE: aching ?Pain description: intermittent  ?Aggravating factors: prolonged standing,  ?Relieving factors: heat ? ? ?OBJECTIVE:  ?   ?PATIENT SURVEYS:  ?FOTO - will assess next session ?08/22/2021: 45% ?  ?COGNITION: ?         Overall cognitive status: Within functional limits for tasks assessed              ?          ?SENSATION: ?         Light touch: Appears intact ?  ?POSTURE:  ?Small body habitus; fwd head, rounded shoulders ?  ?PALPATION: ?TTP to L lumbar paraspinals, bilateral hands ?  ?LUMBARAROM/PROM ?  ?A/PROM A/PROM  ?08/15/2021  ?Flexion Increased pain; limited  ?Extension Limited; decreased pain  ? (Blank rows = not tested) ?  ?LE MMT: ?  ?MMT Right ?08/15/2021 Left ?08/15/2021  ?Hip flexion  3/5 3/5  ?Hip extension      ?Hip abduction 3/5 3/5  ?Hip adduction 3/5 3/5  ?Hip external rotation      ?Hip internal rotation      ?Knee extension 4/5 4/5  ?Knee flexion 3+/5 3+/5  ?Ankle dorsiflexion       ?Ankle plantarflexion      ?Ankle inversion      ?Ankle eversion      ?Grossly      ?(Blank rows = not tested) ?Grip strength 18# bilaterally ?  ?LUMBAR SPECIAL TESTS:  ?Straight leg raise test: Negative and Slump test: Negative ?  ?FUNCTIONAL TESTS:  ?30 Second Sit to Stand: 6 reps ?  ?GAIT: ?Distance walked: 91f ?Assistive device utilized: Single point cane ?Level of assistance: Modified independence ?Comments: short step length, slow gait speed ?  ?TOD[facility]'S TREATMENT  [facility]                                                [facility] ?Therapeutic Exercise: ?NuStep lvl 5 UE/LE x 4 min while taking subjective ?Bridge 3" hold 2x10  ?Supine  SLR 2x10 each ?Supine hip abd 2x15 RTB ?Supine ball squeeze 2x10 - 5" hold ?STS 2x10 no UE support ?Standing hip abd/ext x 10 each ?Neuromuscular re-ed: ?Tandem stance 3x30" each ? [facility]  [facility]2021 ?Therapeutic Exercise: ?Bridge 3" hold 2 x 10  ?Supine hip abd 2 x 10 RTB ?Supine ball squeeze 2 x 10 - 5" hold ?Seated march 2x20 ?STS x10 no UE support ?Neuromuscular re-ed: ?Romberg stance EO/EC x30" ?Romberg stance on airex 2x30" ?March on airex 2x20 ?Weaving around cones figure 8 pattern ?Stepping over cones (cones laid on their side) ? ? ?[facility] Adult [facility]: DATE: 08/14/2021 ?Therapeutic Exercise: ?Bridge x 10  ?Supine hip abd x 10 RTB ?Supine ball squeeze x 10 - 5" hold ?LTR x 10 ?Standing lumbar ext x 5 ?  ?PATIENT EDUCATION:  ?Education details: eval findings, HEP, POC ?Person educated: Patient ?Education method: Explanation, Demonstration, and Handouts ?Education comprehension: verbalized understanding and returned demonstration ?  ?  ?HOME EXERCISE PROGRAM: ?Access Code: [email or web address] ?URL: [email or web address] ?Date: 08/30/2021 ?Prepared by: Octavio Manns ? ?Exercises ?Supine Bridge  - 1 x daily - 7 x weekly - 3 sets - 10 reps ?Supine Lower Trunk Rotation  - 1 x daily - 7 x weekly - 2 sets - 10 reps ?Hooklying Clamshell with Resistance  - 1 x daily - 7 x weekly - 3 sets - 10 reps ?Supine Hip Adduction Isometric with Ball  - 1 x daily - 7 x weekly - 3 sets - 10 reps ?Standing Lumbar Extension  - 1 x daily - 7 x weekly - 2 sets - 10 reps ?Supine Active Straight Leg Raise  - 1 x daily - 7 x weekly - 2 sets - 10 reps ? ?  ?  ?ASSESSMENT: ?  ?CLINICAL IMPRESSION: ?Pt was able to complete all prescribed exercises with no adverse effect or increase in pain. Therapy today focused on improving core and hip strength as well as balance for decreasing pain and improving safety. HEP updated for continued proximal hip strengthening at home.  Pt noted that she would like to try her HEP at home for a few weeks and see if pain stays well controlled. PT explained 30 day window for PT before she would be discharged. Pt and daughter verbalized understanding

## 2021-09-03 ENCOUNTER — Ambulatory Visit: Payer: Medicare HMO | Admitting: Orthopaedic Surgery

## 2021-09-03 ENCOUNTER — Encounter: Payer: Self-pay | Admitting: Orthopaedic Surgery

## 2021-09-03 DIAGNOSIS — G5601 Carpal tunnel syndrome, right upper limb: Secondary | ICD-10-CM

## 2021-09-03 DIAGNOSIS — G5602 Carpal tunnel syndrome, left upper limb: Secondary | ICD-10-CM | POA: Diagnosis not present

## 2021-09-03 DIAGNOSIS — G5603 Carpal tunnel syndrome, bilateral upper limbs: Secondary | ICD-10-CM

## 2021-09-03 NOTE — Progress Notes (Signed)
The patient is a 88 year old female who has been dealing with bilateral hand numbness with the right worse than left.  It does wake her up at night.  She feels like this may have started after her primary care physician had put her on a certain medication.  That has now been stopped.  We did send her for nerve conduction studies.  She is here for review of these today. ? ?The nerve conduction studies show bilateral moderate to severe carpal tunnel syndrome.  I explained what this means and explained anatomy of the wrist. ? ?We will try night splints at first since this is really her most symptoms are at night.  We will see what being off the medication does in terms of improving her symptoms or not.  I gave them a copy of their nerve conduction report.  We can see her back in 2 months to see how she is doing from a symptom standpoint. ?

## 2021-09-06 ENCOUNTER — Ambulatory Visit: Payer: Medicare HMO

## 2021-09-12 ENCOUNTER — Encounter: Payer: Self-pay | Admitting: Family Medicine

## 2021-09-12 ENCOUNTER — Ambulatory Visit (INDEPENDENT_AMBULATORY_CARE_PROVIDER_SITE_OTHER): Payer: Medicare HMO | Admitting: Family Medicine

## 2021-09-12 VITALS — BP 120/78 | HR 60 | Temp 97.9°F | Ht 62.0 in | Wt 149.0 lb

## 2021-09-12 DIAGNOSIS — M79641 Pain in right hand: Secondary | ICD-10-CM | POA: Diagnosis not present

## 2021-09-12 DIAGNOSIS — H6122 Impacted cerumen, left ear: Secondary | ICD-10-CM

## 2021-09-12 DIAGNOSIS — H9202 Otalgia, left ear: Secondary | ICD-10-CM

## 2021-09-12 DIAGNOSIS — H6120 Impacted cerumen, unspecified ear: Secondary | ICD-10-CM | POA: Insufficient documentation

## 2021-09-12 NOTE — Assessment & Plan Note (Addendum)
May be adding to ear pain and occ feeling of dizziness and muffled hearing  ? ?Ear irrigation done with only small amount of cerumen removed  ?Plan to have her use debrox at home for 1-2 wk and return for another attempt  ? ? ?

## 2021-09-12 NOTE — Assessment & Plan Note (Addendum)
Cerumen impaction and also sneezing/runny nose with seasonal allergies  ?May be adding to occ dizziness  ?Partially successful irritation today  ?Will return for another try after use of debrox at home  ?Consider ENT ref if this does not help ?

## 2021-09-12 NOTE — Progress Notes (Signed)
? ?Subjective:  ? ? Patient ID: Sara [name], female    DOB: [birth year], 88 y.o.   MRN: [id number] ? ?HPI ?Pt presents for multiple medical symptoms including hand pain, sleep problems, L ear pain and dizziness  ? ?Wt Readings from Last 3 Encounters:  ?09/12/21 149 lb (67.6 kg)  ?04/17/21 146 lb 4.8 oz (66.4 kg)  ?03/19/21 148 lb (67.1 kg)  ? ?27.25 kg/m? ? ?Tired all the time  ?L ear hurts , on and off , sharp pain  ?No discharge  ?Does not feel like hearing has changed  ?A little woozy on and off  , for 2-3 d  ?Happens with position change , brief until she gets into position  ?Light headed  ? ?Some runny nose  ?Not stuffy ?Eyes run with the pollen  ? ?No headache  ?No neuro changes  ? ?Still a lot of trouble with hands/ esp left  ?Sees ortho and considering working on her L thumb joint  ?? Cancer drug worsened her problems (anastrazole)  ? ? ?Patient Active Problem List  ? Diagnosis Date Noted  ? Cerumen impaction 09/12/2021  ? Aortic root enlargement ([place]) 01/09/2021  ? Neck pain 01/18/2020  ? Left shoulder pain 01/18/2020  ? Pain in both upper arms 01/18/2020  ? Thyroid nodule 01/04/2020  ? Flank pain 11/22/2019  ? Hand pain, right 10/18/2019  ? Family history of breast cancer   ? Family history of prostate cancer   ? Family history of lung cancer   ? Carcinoma of upper-outer quadrant of right breast in female, estrogen receptor positive ([place]) 02/23/2019  ? Pre-operative clearance 02/21/2019  ? History of breast cancer 02/21/2019  ? Medicare annual wellness visit, subsequent 01/05/2019  ? Encounter for screening mammogram for breast cancer 01/05/2019  ? Knee pain, left 10/14/2018  ? Arthritis of left hip 07/29/2018  ? Bradycardia 07/01/2018  ? Left ear pain 07/01/2018  ? Chronic diastolic heart failure ([place]) 12/27/2017  ? Lumbar spinal stenosis 03/11/2017  ? Diastolic dysfunction 61/44/3154  ? Estrogen deficiency 12/16/2016  ? Routine general medical examination at a health care facility 12/16/2016  ?  CAD (coronary artery disease) 12/04/2016  ? 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 Eczema 03/06/2015  ? Prediabetes 02/11/2014  ? Intertrigo labialis 12/07/2012  ? CONSTIPATION, CHRONIC 07/03/2010  ? Osteoporosis 06/24/2008  ? HYPERCHOLESTEROLEMIA 12/08/2006  ? Essential hypertension 12/08/2006  ? GERD 12/08/2006  ? H/O herpes zoster 12/08/2006  ? ?Past Medical History:  ?Diagnosis Date  ? Allergy   ? CAD (coronary artery disease)   ? Chronic diastolic (congestive) heart failure (HCC)   ? Clinical trial participant   ? [employer] studies in familial dementia  ? Dyspnea 11/28/2016  ? Family history of breast cancer   ? Family history of lung cancer   ? Family history of prostate cancer   ? Frequent UTI   ? GERD (gastroesophageal reflux disease)   ? Hypertension   ? Osteoarthritis   ? hips, back  ? Osteopenia   ? Pneumonia   ? Spinal stenosis   ? Stroke [facility]) 1985  ? ?Past Surgical History:  ?Procedure Laterality Date  ? ABDOMINAL HYSTERECTOMY    ? partial has one ovary  ? APPENDECTOMY    ? BREAST LUMPECTOMY WITH RADIOACTIVE SEED LOCALIZATION Right 04/07/2019  ? Procedure: RIGHT BREAST LUMPECTOMY WITH RADIOACTIVE SEED LOCALIZATION;  Surgeon: Jovita Kussmaul, MD;  Location: [facility];  Service: General;  Laterality: Right;  ? CATARACT EXTRACTION Bilateral   ?  LEFT HEART CATH AND CORONARY ANGIOGRAPHY N/A 11/29/2016  ? Procedure: Left Heart Cath and Coronary Angiography;  Surgeon: Sherren Mocha, MD;  Location: [place] [facility];  Service: Cardiovascular;  Laterality: N/A;  ? LUMBAR DISC SURGERY    ? x 3  ? rotator cuff surgery Right 2010  ? ?Social History  ? ?Tobacco Use  ? Smoking status: Never  ? Smokeless tobacco: Never  ?Vaping Use  ? Vaping Use: Never used  ?Substance Use Topics  ? Alcohol use: No  ?  Alcohol/week: 0.0 standard drinks  ? Drug use: No  ? ?Family History  ?Problem Relation Age of Onset  ? Lung cancer Brother 68  ? Other Mother   ?     kidney tumor  ? Hypertension Mother   ? Diabetes Sister   ?  Diabetes Brother   ? Diabetes Sister   ?     x 2  ? Colon cancer Maternal Uncle   ? Other Sister   ?     intestine burst  ? Breast cancer Maternal Aunt   ?     x 2  ? Breast cancer Daughter   ?     unknown cancer  ? Diabetes Son   ?     x 3  ? Diabetes Daughter   ? Breast cancer Sister   ?     x2, diagnosed in 3s and 66s  ? Prostate cancer Maternal Uncle   ? Esophageal cancer Neg Hx   ? Rectal cancer Neg Hx   ? Stomach cancer Neg Hx   ? ?Allergies  ?Allergen Reactions  ? Amoxicillin   ?  Reaction not known  ? Atorvastatin   ?  aching  ? Cefpodoxime Proxetil   ?  Reaction not known  ? Clarithromycin   ?  Reaction not known  ? Fosamax [Alendronate Sodium]   ?  Body pain   ? Furosemide   ?  REACTION: doesn't help  ? Gabapentin Swelling  ?  Caused severe swellling  ? ?Current Outpatient Medications on File Prior to Visit  ?Medication Sig Dispense Refill  ? acetaminophen (TYLENOL) 500 MG tablet Take 1,000 mg by mouth every 6 (six) hours as needed.    ? aspirin EC 81 MG tablet Take 81 mg by mouth daily.      ? Cholecalciferol (VITAMIN D-3) 1000 UNITS CAPS Take 1,000 Units by mouth daily.     ? diltiazem (CARDIZEM CD) 120 MG 24 hr capsule Take 1 capsule (120 mg total) by mouth daily. 90 capsule 3  ? Docusate Sodium (EQ STOOL SOFTENER PO) Take 100 mg by mouth daily as needed (constipation).     ? doxylamine, Sleep, (UNISOM) 25 MG tablet Take 25 mg by mouth at bedtime as needed for sleep.     ? hydrochlorothiazide (HYDRODIURIL) 25 MG tablet Take 1 tablet (25 mg total) by mouth daily. 90 tablet 3  ? nitroGLYCERIN (NITROSTAT) 0.4 MG SL tablet Place 1 tablet (0.4 mg total) under the tongue every 5 (five) minutes as needed for chest pain (max 3 doses in 15 minutes). 25 tablet 3  ? polyethylene glycol (MIRALAX / GLYCOLAX) packet Take 17 g by mouth daily.     ? potassium chloride (KLOR-CON) 10 MEQ tablet Take 1 tablet (10 mEq total) by mouth daily. 90 tablet 3  ? vitamin B-12 (CYANOCOBALAMIN) 1000 MCG tablet Take 1,000 mcg by  mouth daily.    ? ?No current facility-administered medications on file prior to visit.  ?  ? ?  Review of Systems  ?Constitutional:  Negative for activity change, appetite change, fatigue, fever and unexpected weight change.  ?HENT:  Positive for ear pain and hearing loss. Negative for congestion, ear discharge, facial swelling, rhinorrhea, sinus pressure and sore throat.   ?Eyes:  Negative for pain, redness and visual disturbance.  ?Respiratory:  Negative for cough, shortness of breath and wheezing.   ?Cardiovascular:  Negative for chest pain and palpitations.  ?Gastrointestinal:  Negative for abdominal pain, blood in stool, constipation and diarrhea.  ?Endocrine: Negative for polydipsia and polyuria.  ?Genitourinary:  Negative for dysuria, frequency and urgency.  ?Musculoskeletal:  Negative for arthralgias, back pain and myalgias.  ?Skin:  Negative for pallor and rash.  ?Allergic/Immunologic: Negative for environmental allergies.  ?Neurological:  Negative for dizziness, syncope and headaches.  ?Hematological:  Negative for adenopathy. Does not bruise/bleed easily.  ?Psychiatric/Behavioral:  Negative for decreased concentration and dysphoric mood. The patient is not nervous/anxious.   ? ?   ?Objective:  ? Physical Exam ?Constitutional:   ?   General: She is not in acute distress. ?   Appearance: Normal appearance. She is normal weight. She is not ill-appearing or diaphoretic.  ?HENT:  ?   Head: Normocephalic and atraumatic.  ?   Right Ear: Tympanic membrane and ear canal normal. There is impacted cerumen.  ?   Left Ear: There is no impacted cerumen.  ?   Ears:  ?   Comments: Total cerumen impaction on L  ?Little cerumen on R with nl appearing TM ? ?After consent obtained, simple ear irrigation performed on L ear  ?Only small amt of cerumen extracted/the remaining is much deeper in canal  ? ?Canal is not red or swollen ?   Mouth/Throat:  ?   Mouth: Mucous membranes are moist.  ?   Pharynx: No posterior oropharyngeal  erythema.  ?Eyes:  ?   General:     ?   Right eye: No discharge.     ?   Left eye: No discharge.  ?   Extraocular Movements: Extraocular movements intact.  ?   Conjunctiva/sclera: Conjunctivae normal.  ?   Pup

## 2021-09-12 NOTE — Assessment & Plan Note (Signed)
Ongoing hand pain  ?Both hands  ?Issues with L MCP joint  ?Seeing specialist  ? ?Pt thinks anastrozole may have owrsened this when she was on it ?

## 2021-09-12 NOTE — Patient Instructions (Signed)
Get flonase nasal spray over the counter and use it 1 spray in each nostril once daily in allergy season to help nasal and ear symptoms  ? ? ?Get debrox ear solution , use it as directed every other day in the left ear to loosen ear wax   (you can use in both ears if you want to) ?Then follow up here in 1-2 weeks and we will flush your left ear again  ? ?I think this will help the ear pain and the dizziness ? ?Be careful not to fall  ?

## 2021-09-24 ENCOUNTER — Ambulatory Visit (INDEPENDENT_AMBULATORY_CARE_PROVIDER_SITE_OTHER): Payer: Medicare HMO

## 2021-09-24 DIAGNOSIS — W19XXXS Unspecified fall, sequela: Secondary | ICD-10-CM

## 2021-09-24 DIAGNOSIS — I10 Essential (primary) hypertension: Secondary | ICD-10-CM

## 2021-09-24 DIAGNOSIS — I5032 Chronic diastolic (congestive) heart failure: Secondary | ICD-10-CM

## 2021-09-24 NOTE — Chronic Care Management (AMB) (Signed)
?Chronic Care Management  ? ?CCM RN Visit Note ? ?09/24/2021 ?Name: Sara [name] MRN: [id number] DOB: 09-16-1931 ? ?Subjective: ?Sara [name] is a 88 y.o. year old female who is a primary care patient of Tower, Wynelle Fanny, MD. The care management team was consulted for assistance with disease management and care coordination needs.   ? ?Engaged with patient by telephone for follow up visit in response to provider referral for case management and/or care coordination services.  ? ?Consent to Services:  ?The patient was given information about Chronic Care Management services, agreed to services, and gave verbal consent prior to initiation of services.  Please see initial visit note for detailed documentation.  ? ?Patient agreed to services and verbal consent obtained.  ? ?Assessment: Review of patient past medical history, allergies, medications, health status, including review of consultants reports, laboratory and other test data, was performed as part of comprehensive evaluation and provision of chronic care management services.  ? ?SDOH (Social Determinants of Health) assessments and interventions performed:   ? ?CCM Care Plan ? ?Allergies  ?Allergen Reactions  ? Amoxicillin   ?  Reaction not known  ? Atorvastatin   ?  aching  ? Cefpodoxime Proxetil   ?  Reaction not known  ? Clarithromycin   ?  Reaction not known  ? Fosamax [Alendronate Sodium]   ?  Body pain   ? Furosemide   ?  REACTION: doesn't help  ? Gabapentin Swelling  ?  Caused severe swellling  ? ? ?Outpatient Encounter Medications as of 09/24/2021  ?Medication Sig  ? acetaminophen (TYLENOL) 500 MG tablet Take 1,000 mg by mouth every 6 (six) hours as needed.  ? aspirin EC 81 MG tablet Take 81 mg by mouth daily.    ? Cholecalciferol (VITAMIN D-3) 1000 UNITS CAPS Take 1,000 Units by mouth daily.   ? diltiazem (CARDIZEM CD) 120 MG 24 hr capsule Take 1 capsule (120 mg total) by mouth daily.  ? doxylamine, Sleep, (UNISOM) 25 MG tablet Take 25  mg by mouth at bedtime as needed for sleep.   ? hydrochlorothiazide (HYDRODIURIL) 25 MG tablet Take 1 tablet (25 mg total) by mouth daily.  ? nitroGLYCERIN (NITROSTAT) 0.4 MG SL tablet Place 1 tablet (0.4 mg total) under the tongue every 5 (five) minutes as needed for chest pain (max 3 doses in 15 minutes).  ? polyethylene glycol (MIRALAX / GLYCOLAX) packet Take 17 g by mouth daily.   ? potassium chloride (KLOR-CON) 10 MEQ tablet Take 1 tablet (10 mEq total) by mouth daily.  ? vitamin B-12 (CYANOCOBALAMIN) 1000 MCG tablet Take 1,000 mcg by mouth daily.  ? Docusate Sodium (EQ STOOL SOFTENER PO) Take 100 mg by mouth daily as needed (constipation).  (Patient not taking: Reported on 09/24/2021)  ? ?No facility-administered encounter medications on file as of 09/24/2021.  ? ? ?Patient Active Problem List  ? Diagnosis Date Noted  ? Cerumen impaction 09/12/2021  ? Aortic root enlargement ([place]) 01/09/2021  ? Neck pain 01/18/2020  ? Left shoulder pain 01/18/2020  ? Pain in both upper arms 01/18/2020  ? Thyroid nodule 01/04/2020  ? Flank pain 11/22/2019  ? Hand pain, right 10/18/2019  ? Family history of breast cancer   ? Family history of prostate cancer   ? Family history of lung cancer   ? Carcinoma of upper-outer quadrant of right breast in female, estrogen receptor positive ([place]) 02/23/2019  ? Pre-operative clearance 02/21/2019  ? History of breast cancer 02/21/2019  ?  Medicare annual wellness visit, subsequent 01/05/2019  ? Encounter for screening mammogram for breast cancer 01/05/2019  ? Knee pain, left 10/14/2018  ? Arthritis of left hip 07/29/2018  ? Bradycardia 07/01/2018  ? Left ear pain 07/01/2018  ? Chronic diastolic heart failure ([place]) 12/27/2017  ? Lumbar spinal stenosis 03/11/2017  ? Diastolic dysfunction 88/91/6945  ? Estrogen deficiency 12/16/2016  ? Routine general medical examination at a health care facility 12/16/2016  ? CAD (coronary artery disease) 12/04/2016  ? 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 Eczema  03/06/2015  ? Prediabetes 02/11/2014  ? Intertrigo labialis 12/07/2012  ? CONSTIPATION, CHRONIC 07/03/2010  ? Osteoporosis 06/24/2008  ? HYPERCHOLESTEROLEMIA 12/08/2006  ? Essential hypertension 12/08/2006  ? GERD 12/08/2006  ? H/O herpes zoster 12/08/2006  ? ? ?Conditions to be addressed/monitored:CHF, HTN, and falls ? ?Care Plan : RN  Care Management Plan of care  ?Updates made by Dannielle Karvonen, RN since 09/24/2021 12:00 AM  ?  ? ?Problem: Chronic disease management, education and / or care coordination needs.   ?Priority: High  ?  ? ?Long-Range Goal: Development of plan of care to address chronic disease management and/ or care coordination needs.   ?Start Date: 04/19/2021  ?Expected End Date: 12/07/2021  ?Priority: High  ?Note:   ?Current Barriers:  ?Knowledge Deficits related to plan of care for management of CHF and HTN  ?Chronic Disease Management support and education needs related to CHF and HTN ?Patient states she is taking physical therapy for her low back pain. She states she does not want to continue because she doesn't feel like it helps. Patient states she is mostly concerned about her hand pain. She reports wearing the night splints to her hands as advised. She reports an initial pain/ tingling of 10 prior to night splints now reports a level 6 in discomfort. She states she does not wake up at night as much as she use to due to the discomfort.  Per chart review next follow up visit is 11/01/21.  Patient reports having a stopped up ear for the past several weeks with some dizziness. She states she has a follow up visit with her primary care provider on 09/26/21 to have left ear irrigated.    ? ?RNCM Clinical Goal(s):  ?Patient will verbalize understanding of plan for management of CHF and HTN ?take all medications exactly as prescribed and will call provider for medication related questions ?attend all scheduled medical appointments:  ?continue to work with RN Care Manager to address care management and  care coordination needs related to  CHF and HTN through collaboration with RN Care manager, provider, and care team.  ?Patient will obtain scales and weigh daily and record ? ?Interventions: ?1:1 collaboration with primary care provider regarding development and update of comprehensive plan of care as evidenced by provider attestation and co-signature ?Inter-disciplinary care team collaboration (see longitudinal plan of care) ?Evaluation of current treatment plan related to  self management and patient's adherence to plan as established by provider ? ?Hypertension Interventions:  Goal on Track:  Yes Long term goal  ?Last practice recorded BP readings:  ?BP Readings from Last 3 Encounters:  ?09/12/21 120/78  ?04/17/21 (!) 183/88  ?03/19/21 (!) 180/80  ?Most recent eGFR/CrCl: No results found for: EGFR  No components found for: CRCL ? ?Evaluation of current treatment plan related to hypertension self management and patient's adherence to plan as established by provider; ?Reviewed medications with patient and discussed importance of compliance; ?Discussed plans with patient for ongoing  care management follow up and provided patient with direct contact information for care management team; ?Advised patient, providing education and rationale, to monitor blood pressure 2-3 times per week and record, calling PCP for findings outside established parameters; Patient reports her blood pressure monitor is not working right.  Advised patient to take blood pressure monitor with her to next primary care provider appointment.  ?Reviewed scheduled/upcoming provider appointments  ? ?Heart Failure Interventions: Goal on track Yes Long term Goal ?Reviewed Heart Failure Action Plan ?Discussed importance of daily weight and advised patient to weigh and record daily; ?Discussed the importance of keeping all appointments with provider; ?Reviewed medications and discussed importance of compliance.  ?Advised to call and scheduled yearly  follow up with cardiologist.  ? ? ?Falls Interventions:  (Status:  New goal.) Short Term Goal ?Discussed  potential causes of falls and Fall prevention strategies ?Reviewed medications and discussed potent

## 2021-09-24 NOTE — Patient Instructions (Addendum)
Visit Information ? ?Thank you for taking time to visit with me today. Please don't hesitate to contact me if I can be of assistance to you before our next scheduled telephone appointment. ? ?Following are the goals we discussed today:  ?Follow up with your doctors as recommended.  ?Call provider office for new concerns or questions  ?keep legs up while sitting ? Report symptoms to your doctor immediately:  gaining 3 pounds in one day or 5 pounds in one week,  increase shortness of breath, swelling in feet, ankles, legs or your waistband gets tight.  ? Continue to follow a low salt diet plan ? Consider obtaining scales and weighing daily and check your blood pressure at least 1 -2 times per week.  Record your readings for your weight and blood pressure. ?Continue to take your medications as prescribed and refill timely ?Wear hand splints at night as recommended.   ?Take your blood pressure monitor to your next primary care provider appointment to have them evaluate workability and calibrate.  ?Call to schedule yearly follow up with cardiologist.  ? ?Our next appointment is by telephone on 11/15/21 at 10:00 am ? ?Please call the care guide team at [phone] if you need to cancel or reschedule your appointment.  ? ?If you are experiencing a Mental Health or [place] or need someone to talk to, please call the Suicide and Crisis Lifeline: 988 ?call 1-800-273-TALK (toll free, 24 hour hotline)  ? ?The patient verbalized understanding of instructions, educational materials, and care plan provided today and agreed to receive a mailed copy of patient instructions, educational materials, and care plan.  ? ?Quinn Plowman RN,BSN,CCM ?RN Case Manager ?Sara [name]  ?[phone] ? ?

## 2021-09-26 ENCOUNTER — Ambulatory Visit (INDEPENDENT_AMBULATORY_CARE_PROVIDER_SITE_OTHER): Payer: Medicare HMO | Admitting: Family Medicine

## 2021-09-26 ENCOUNTER — Encounter: Payer: Self-pay | Admitting: Family Medicine

## 2021-09-26 VITALS — BP 144/72 | HR 61 | Temp 98.0°F | Ht 61.0 in | Wt 148.4 lb

## 2021-09-26 DIAGNOSIS — H6122 Impacted cerumen, left ear: Secondary | ICD-10-CM

## 2021-09-26 NOTE — Progress Notes (Addendum)
Subjective:    Patient ID: Sara [name], female    DOB: Feb 17, 1932, 88 y.o.   MRN: [id number]  HPI Pt presents for f/u of ear problem   Wt Readings from Last 3 Encounters:  09/26/21 148 lb 6 oz (67.3 kg)  09/12/21 149 lb (67.6 kg)  04/17/21 146 lb 4.8 oz (66.4 kg)   28.04 kg/m   Last visit noted cerumen impaction of L ear  Irrigation attempted w/o success She was adv to use debrox home and return for follow up   Nothing has come out on her own  Cannot hear out of L ear  Still full feeling and occ aches  Occ off balance   Patient Active Problem List   Diagnosis Date Noted   Cerumen impaction 09/12/2021   Aortic root enlargement (HCC) 01/09/2021   Neck pain 01/18/2020   Left shoulder pain 01/18/2020   Pain in both upper arms 01/18/2020   Thyroid nodule 01/04/2020   Flank pain 11/22/2019   Hand pain, right 10/18/2019   Family history of breast cancer    Family history of prostate cancer    Family history of lung cancer    Carcinoma of upper-outer quadrant of right breast in female, estrogen receptor positive (HCC) 02/23/2019   Pre-operative clearance 02/21/2019   History of breast cancer 02/21/2019   Medicare annual wellness visit, subsequent 01/05/2019   Encounter for screening mammogram for breast cancer 01/05/2019   Knee pain, left 10/14/2018   Arthritis of left hip 07/29/2018   Bradycardia 07/01/2018   Left ear pain 07/01/2018   Chronic diastolic heart failure (HCC) 12/27/2017   Lumbar spinal stenosis 03/11/2017   Diastolic dysfunction 01/14/2017   Estrogen deficiency 12/16/2016   Routine general medical examination at a health care facility 12/16/2016   CAD (coronary artery disease) 12/04/2016   Mucocele, 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   Eczema 03/06/2015   Prediabetes 02/11/2014   Intertrigo labialis 12/07/2012   CONSTIPATION, CHRONIC 07/03/2010   Osteoporosis 06/24/2008   HYPERCHOLESTEROLEMIA 12/08/2006   Essential hypertension 12/08/2006   GERD  12/08/2006   H/O herpes zoster 12/08/2006   Past Medical History:  Diagnosis Date   Allergy    CAD (coronary artery disease)    Chronic diastolic (congestive) heart failure (HCC)    Clinical trial participant    [facility] Genetic studies in familial dementia   Dyspnea 11/28/2016   Family history of breast cancer    Family history of lung cancer    Family history of prostate cancer    Frequent UTI    GERD (gastroesophageal reflux disease)    Hypertension    Osteoarthritis    hips, back   Osteopenia    Pneumonia    Spinal stenosis    Stroke (HCC) 1985   Past Surgical History:  Procedure Laterality Date   ABDOMINAL HYSTERECTOMY     partial has one ovary   APPENDECTOMY     BREAST LUMPECTOMY WITH RADIOACTIVE SEED LOCALIZATION Right 04/07/2019   Procedure: RIGHT BREAST LUMPECTOMY WITH RADIOACTIVE SEED LOCALIZATION;  Surgeon: Griselda Miner, MD;  Location: [facility] OR;  Service: General;  Laterality: Right;   CATARACT EXTRACTION Bilateral    LEFT HEART CATH AND CORONARY ANGIOGRAPHY N/A 11/29/2016   Procedure: Left Heart Cath and Coronary Angiography;  Surgeon: Tonny Bollman, MD;  Location: [facility] INVASIVE CV LAB;  Service: Cardiovascular;  Laterality: N/A;   LUMBAR DISC SURGERY     x 3   rotator cuff surgery Right 2010   Social History  Tobacco Use   Smoking status: Never   Smokeless tobacco: Never  Vaping Use   Vaping Use: Never used  Substance Use Topics   Alcohol use: No    Alcohol/week: 0.0 standard drinks   Drug use: No   Family History  Problem Relation Age of Onset   Lung cancer Brother 77   [name] Mother        kidney tumor   Hypertension Mother    Diabetes Sister    Diabetes Brother    Diabetes Sister        x 2   Colon cancer Maternal Uncle    [name] Sister        intestine burst   Breast cancer Maternal Aunt        x 2   Breast cancer Daughter        unknown cancer   Diabetes Son        x 3   Diabetes Daughter    Breast cancer Sister        x2,  diagnosed in 102s and 1s   Prostate cancer Maternal Uncle    Esophageal cancer Neg Hx    Rectal cancer Neg Hx    Stomach cancer Neg Hx    Allergies  Allergen Reactions   Amoxicillin     Reaction not known   Atorvastatin     aching   Cefpodoxime Proxetil     Reaction not known   Clarithromycin     Reaction not known   Fosamax [Alendronate Sodium]     Body pain    Furosemide     REACTION: doesn't help   Gabapentin Swelling    Caused severe swellling   Current Outpatient Medications on File Prior to Visit  Medication Sig Dispense Refill   acetaminophen (TYLENOL) 500 MG tablet Take 1,000 mg by mouth every 6 (six) hours as needed.     aspirin EC 81 MG tablet Take 81 mg by mouth daily.       Cholecalciferol (VITAMIN D-3) 1000 UNITS CAPS Take 1,000 Units by mouth daily.      diltiazem (CARDIZEM CD) 120 MG 24 hr capsule Take 1 capsule (120 mg total) by mouth daily. 90 capsule 3   Docusate Sodium (EQ STOOL SOFTENER PO) Take 100 mg by mouth daily as needed (constipation).     doxylamine, Sleep, (UNISOM) 25 MG tablet Take 25 mg by mouth at bedtime as needed for sleep.      hydrochlorothiazide (HYDRODIURIL) 25 MG tablet Take 1 tablet (25 mg total) by mouth daily. 90 tablet 3   nitroGLYCERIN (NITROSTAT) 0.4 MG SL tablet Place 1 tablet (0.4 mg total) under the tongue every 5 (five) minutes as needed for chest pain (max 3 doses in 15 minutes). 25 tablet 3   polyethylene glycol (MIRALAX / GLYCOLAX) packet Take 17 g by mouth daily.      potassium chloride (KLOR-CON) 10 MEQ tablet Take 1 tablet (10 mEq total) by mouth daily. 90 tablet 3   vitamin B-12 (CYANOCOBALAMIN) 1000 MCG tablet Take 1,000 mcg by mouth daily.     No current facility-administered medications on file prior to visit.     Review of Systems  Constitutional:  Negative for activity change, appetite change, fatigue, fever and unexpected weight change.  HENT:  Positive for ear pain, hearing loss and tinnitus. Negative for  congestion, ear discharge, rhinorrhea, sinus pressure and sore throat.   Eyes:  Negative for pain, redness and visual disturbance.  Respiratory:  Negative  for cough, shortness of breath and wheezing.   Cardiovascular:  Negative for chest pain and palpitations.  Gastrointestinal:  Negative for abdominal pain, blood in stool, constipation and diarrhea.  Endocrine: Negative for polydipsia and polyuria.  Genitourinary:  Negative for dysuria, frequency and urgency.  Musculoskeletal:  Negative for arthralgias, back pain and myalgias.  Skin:  Negative for pallor and rash.  Allergic/Immunologic: Negative for environmental allergies.  Neurological:  Negative for dizziness, syncope and headaches.  Hematological:  Negative for adenopathy. Does not bruise/bleed easily.  Psychiatric/Behavioral:  Negative for decreased concentration and dysphoric mood. The patient is not nervous/anxious.       Objective:   Physical Exam Constitutional:      General: She is not in acute distress.    Appearance: Normal appearance. She is normal weight. She is not ill-appearing.  HENT:     Head: Normocephalic and atraumatic.     Right Ear: There is impacted cerumen.     Left Ear: There is impacted cerumen.     Ears:     Comments: Cerumen impaction, worse on the L   After consent obtained, simple ear irrigation performed with resolution of impaction  Pt tolerated the procedure well  S/p procedure TMs appear nl with only scant water in ear canals No swelling or erythema  Improved hearing on L at end of visit  Eyes:     General:        Right eye: No discharge.        Left eye: No discharge.     Conjunctiva/sclera: Conjunctivae normal.     Pupils: Pupils are equal, round, and reactive to light.  Cardiovascular:     Rate and Rhythm: Normal rate and regular rhythm.     Heart sounds: Normal heart sounds.  Pulmonary:     Effort: Pulmonary effort is normal.     Breath sounds: Normal breath sounds.  Lymphadenopathy:      Cervical: No cervical adenopathy.  Skin:    General: Skin is dry.     Coloration: Skin is not pale.     Findings: No erythema or rash.  Neurological:     Mental Status: She is alert.     Cranial Nerves: No cranial nerve deficit.  Psychiatric:        Mood and Affect: Mood normal.          Assessment & Plan:   Problem List Items Addressed This Visit       Nervous and Auditory   Cerumen impaction - Primary    Improved with simple ear irrigation  Hearing is improved  Nl appearing TMs  inst to alert [place] if no further improvement or if ear pain returns Hearing aides should work better now

## 2021-09-26 NOTE — Patient Instructions (Signed)
Use the debrox once or twice a month to prevent ear wax problems  ? ?Give the water a chance to drain  ?Hearing should return to your normal   (hopefully the hearing aides will work better)  ?Pain should improve in the next several days  ? ?Ears look better !  ? ? ?

## 2021-09-26 NOTE — Assessment & Plan Note (Addendum)
Improved with simple ear irrigation which pt tolerated well ?Hearing is improved  ?Nl appearing TMs ? ?inst to alert [place] if no further improvement or if ear pain returns ?Hearing aides should work better now  ? ?

## 2021-10-07 DIAGNOSIS — I5032 Chronic diastolic (congestive) heart failure: Secondary | ICD-10-CM

## 2021-10-07 DIAGNOSIS — I110 Hypertensive heart disease with heart failure: Secondary | ICD-10-CM | POA: Diagnosis not present

## 2021-10-07 DIAGNOSIS — I10 Essential (primary) hypertension: Secondary | ICD-10-CM

## 2021-11-01 ENCOUNTER — Ambulatory Visit: Payer: Medicare HMO | Admitting: Orthopaedic Surgery

## 2021-11-15 ENCOUNTER — Ambulatory Visit (INDEPENDENT_AMBULATORY_CARE_PROVIDER_SITE_OTHER): Payer: Medicare HMO

## 2021-11-15 ENCOUNTER — Telehealth: Payer: Medicare HMO

## 2021-11-15 DIAGNOSIS — I10 Essential (primary) hypertension: Secondary | ICD-10-CM

## 2021-11-15 DIAGNOSIS — W19XXXS Unspecified fall, sequela: Secondary | ICD-10-CM

## 2021-11-15 DIAGNOSIS — I5032 Chronic diastolic (congestive) heart failure: Secondary | ICD-10-CM

## 2021-11-15 NOTE — Chronic Care Management (AMB) (Signed)
Chronic Care Management   CCM RN Visit Note  11/15/2021 Name: Sara [name] MRN: [id number] DOB: [birth year]  Subjective: [name] is a 88 y.o. year old female who is a primary care patient of Tower, Sara Fanny, MD. The care management team was consulted for assistance with disease management and care coordination needs.    Engaged with patient by telephone for follow up visit in response to provider referral for case management and/or care coordination services.   Consent to Services:  The patient was given information about Chronic Care Management services, agreed to services, and gave verbal consent prior to initiation of services.  Please see initial visit note for detailed documentation.   Patient agreed to services and verbal consent obtained.   Assessment: Review of patient past medical history, allergies, medications, health status, including review of consultants reports, laboratory and other test data, was performed as part of comprehensive evaluation and provision of chronic care management services.   SDOH (Social Determinants of Health) assessments and interventions performed:    CCM Care Plan  Allergies  Allergen Reactions   Amoxicillin     Reaction not known   Atorvastatin     aching   Cefpodoxime Proxetil     Reaction not known   Clarithromycin     Reaction not known   Fosamax [Alendronate Sodium]     Body pain    Furosemide     REACTION: doesn't help   Gabapentin Swelling    Caused severe swellling    Outpatient Encounter Medications as of 11/15/2021  Medication Sig   acetaminophen (TYLENOL) 500 MG tablet Take 1,000 mg by mouth every 6 (six) hours as needed.   aspirin EC 81 MG tablet Take 81 mg by mouth daily.     Cholecalciferol (VITAMIN D-3) 1000 UNITS CAPS Take 1,000 Units by mouth daily.    diltiazem (CARDIZEM CD) 120 MG 24 hr capsule Take 1 capsule (120 mg total) by mouth daily.   Docusate Sodium (EQ STOOL SOFTENER PO) Take 100 mg  by mouth daily as needed (constipation).   doxylamine, Sleep, (UNISOM) 25 MG tablet Take 25 mg by mouth at bedtime as needed for sleep.    hydrochlorothiazide (HYDRODIURIL) 25 MG tablet Take 1 tablet (25 mg total) by mouth daily.   nitroGLYCERIN (NITROSTAT) 0.4 MG SL tablet Place 1 tablet (0.4 mg total) under the tongue every 5 (five) minutes as needed for chest pain (max 3 doses in 15 minutes).   polyethylene glycol (MIRALAX / GLYCOLAX) packet Take 17 g by mouth daily.    potassium chloride (KLOR-CON) 10 MEQ tablet Take 1 tablet (10 mEq total) by mouth daily.   vitamin B-12 (CYANOCOBALAMIN) 1000 MCG tablet Take 1,000 mcg by mouth daily.   No facility-administered encounter medications on file as of 11/15/2021.    Patient Active Problem List   Diagnosis Date Noted   Cerumen impaction 09/12/2021   Aortic root enlargement ([place]) 01/09/2021   Neck pain 01/18/2020   Left shoulder pain 01/18/2020   Pain in both upper arms 01/18/2020   Thyroid nodule 01/04/2020   Flank pain 11/22/2019   Hand pain, right 10/18/2019   Family history of breast cancer    Family history of prostate cancer    Family history of lung cancer    Carcinoma of upper-outer quadrant of right breast in female, estrogen receptor positive ([place]) 02/23/2019   Pre-operative clearance 02/21/2019   History of breast cancer 02/21/2019   Medicare annual wellness visit, subsequent 01/05/2019  Encounter for screening mammogram for breast cancer 01/05/2019   Knee pain, left 10/14/2018   Arthritis of left hip 07/29/2018   Bradycardia 07/01/2018   Left ear pain 07/01/2018   Chronic diastolic heart failure ([place]) 12/27/2017   Lumbar spinal stenosis 07/21/1733   Diastolic dysfunction 67/06/4101   Estrogen deficiency 12/16/2016   Routine general medical examination at a health care facility 12/16/2016   CAD (coronary artery disease) 12/04/2016   Mucocele, 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   Priority: High     Long-Range Goal: Development of plan of care to address chronic disease management and/ or care coordination needs.   Start Date: 04/19/2021  Expected End Date: 02/07/2022  Priority: High  Note:   Current Barriers:  Knowledge Deficits related to plan of care for management of CHF and HTN  Chronic Disease Management support and education needs related to CHF and HTN Patient states she saw her primary care provider on 09/26/21 due to ear pain, fullness and decrease in hearing. She reports having her ear irrigated at that time and   states she has noticed some improvement.  Patient states her sense of balance seems to be better as well.  Denies any falls.   She states she has a follow up appointment scheduled with the hearing aid provider within the next month.  Patient states her blood pressures have ranged from 120/70 to 130/70.  She states her pulse rate ranges in the 50's.  She recalls having a pulse rate of 48 recently.  She states she is unsure if she had symptoms at that time. Patient reports her hands are feeling better since wearing the night splints.  RNCM Clinical Goal(s):  Patient will verbalize understanding of plan for management of CHF and HTN take all medications exactly as prescribed and will call provider for medication related questions attend all scheduled medical appointments:  continue to work with RN Care Manager to address care management and care coordination needs related to  CHF and HTN through  collaboration with RN Care manager, provider, and care team.  Patient will obtain scales and weigh daily and record  Interventions: 1:1 collaboration with primary care provider regarding development and update of comprehensive plan of care as evidenced by provider attestation and co-signature Inter-disciplinary care team collaboration (see longitudinal plan of care) Evaluation of current treatment plan related to  self management and patient's adherence to plan as established by provider  Hypertension Interventions:  Goal on Track:  Yes Long term goal  Last practice recorded BP readings:  BP Readings from Last 3 Encounters:  09/26/21 (!) 144/72  09/12/21 120/78  04/17/21 (!) 183/88  Most recent eGFR/CrCl: No results found for: "EGFR"  No components found for: "CRCL"  Evaluation of current treatment plan related to hypertension self management and patient's adherence to plan as established by provider; Reviewed medications with patient and discussed importance of compliance; Discussed plans with patient for ongoing care management follow up and provided patient with direct contact information for  care management team; Advised patient, providing education and rationale, to monitor blood pressure 2-3 times per week and record, calling PCP for findings outside established parameters;   Reviewed scheduled/upcoming provider appointments  Advised to continue to follow a low salt diet.   Heart Failure Interventions: Goal on track Yes Long term Goal Reviewed Heart Failure Action Plan Discussed importance of daily weight and encouraged to weigh Discussed the importance of keeping all appointments with provider; Reviewed medications and discussed importance of compliance.  Re-Advised to call and scheduled yearly follow up with cardiologist.   Falls Interventions:  (Status:  New goal.) Short Term Goal Discussed  potential causes of falls and Fall prevention strategies Reviewed medications and  discussed potential side effects of medications such as dizziness and frequent urination Advised patient of importance of notifying provider of falls Assessed falls since last outreach with RNCM.  Advised patient to take note of low pulse rates and write down any symptoms if noted. Advised to notify her doctor for pulse rated <50  Patient Goals/Self-Care Activities: Follow up with your doctors as recommended.  Call provider office for new symptoms or concerns  Continue to follow a low salt diet plan  Consider obtaining scales and weighing daily and check your blood pressure at least 1 -2 times per week.  Record your readings for your weight and blood pressure. Continue to take your medications as prescribed and refill timely Continue to wear hand splints at night as recommended.   Call to schedule yearly follow up with cardiologist.  Continue to monitor your pulse rate.  Make note of pulse rate less than 50 and if you are having any symptoms such as dizziness/ lightheadedness.  Report this to your provider as soon as possible.           Plan:The patient has been provided with contact information for the care management team and has been advised to call with any health related questions or concerns.  The care management team will reach out to the patient again over the next 2 months . Quinn Plowman RN,BSN,CCM RN Case Manager [place]  [phone]

## 2021-11-15 NOTE — Patient Instructions (Signed)
Visit Information  Thank you for taking time to visit with me today. Please don't hesitate to contact me if I can be of assistance to you before our next scheduled telephone appointment.  Following are the goals we discussed today:  Follow up with your doctors as recommended.  Call provider office for new symptoms or concerns  Continue to follow a low salt diet plan  Consider obtaining scales and weighing daily and check your blood pressure at least 1 -2 times per week.  Record your readings for your weight and blood pressure. Continue to take your medications as prescribed and refill timely Continue to wear hand splints at night as recommended.   Call to schedule yearly follow up with cardiologist.  Continue to monitor your pulse rate.  Make note of pulse rate less than 50 and if you are having any symptoms such as dizziness/ lightheadedness.  Report this to your provider as soon as possible.   Our next appointment is by telephone on 01/29/22 at 10:00 am  Please call the care guide team at [phone] if you need to cancel or reschedule your appointment.   If you are experiencing a Mental Health or [place] or need someone to talk to, please call the Suicide and Crisis Lifeline: 988 call 1-800-273-TALK (toll free, 24 hour hotline)   The patient verbalized understanding of instructions, educational materials, and care plan provided today and agreed to receive a mailed copy of patient instructions, educational materials, and care plan.   Quinn Plowman RN,BSN,CCM RN Case Manager [place]  [phone]

## 2021-11-18 ENCOUNTER — Other Ambulatory Visit: Payer: Self-pay | Admitting: Family Medicine

## 2021-12-07 DIAGNOSIS — I509 Heart failure, unspecified: Secondary | ICD-10-CM

## 2021-12-07 DIAGNOSIS — I110 Hypertensive heart disease with heart failure: Secondary | ICD-10-CM | POA: Diagnosis not present

## 2022-01-01 ENCOUNTER — Ambulatory Visit (INDEPENDENT_AMBULATORY_CARE_PROVIDER_SITE_OTHER): Payer: Medicare HMO

## 2022-01-01 DIAGNOSIS — I10 Essential (primary) hypertension: Secondary | ICD-10-CM

## 2022-01-01 DIAGNOSIS — I5032 Chronic diastolic (congestive) heart failure: Secondary | ICD-10-CM

## 2022-01-01 DIAGNOSIS — W19XXXS Unspecified fall, sequela: Secondary | ICD-10-CM

## 2022-01-01 NOTE — Chronic Care Management (AMB) (Signed)
Care Management    RN Visit Note  01/01/2022 Name: Sara [name] MRN: [id number] DOB: 03/15/1932  Subjective: Sara [name] is a 88 y.o. year old female who is a primary care patient of Tower, Sara Fanny, MD. The care management team was consulted for assistance with disease management and care coordination needs.    Engaged with patient by telephone for follow up visit in response to provider referral for case management and/or care coordination services.   Consent to Services:   [name] was given information about Care Management services today including:  Care Management services includes personalized support from designated clinical staff supervised by her physician, including individualized plan of care and coordination with other care providers 24/7 contact phone numbers for assistance for urgent and routine care needs. The patient may stop case management services at any time by phone call to the office staff.  Patient agreed to services and consent obtained.   Assessment: Review of patient past medical history, allergies, medications, health status, including review of consultants reports, laboratory and other test data, was performed as part of comprehensive evaluation and provision of chronic care management services.   SDOH (Social Determinants of Health) assessments and interventions performed:    Care Plan  Allergies  Allergen Reactions   Amoxicillin     Reaction not known   Atorvastatin     aching   Cefpodoxime Proxetil     Reaction not known   Clarithromycin     Reaction not known   Fosamax [Alendronate Sodium]     Body pain    Furosemide     REACTION: doesn't help   Gabapentin Swelling    Caused severe swellling    Outpatient Encounter Medications as of 01/01/2022  Medication Sig   acetaminophen (TYLENOL) 500 MG tablet Take 1,000 mg by mouth every 6 (six) hours as needed.   aspirin EC 81 MG tablet Take 81 mg by mouth daily.      Cholecalciferol (VITAMIN D-3) 1000 UNITS CAPS Take 1,000 Units by mouth daily.    diltiazem (CARDIZEM CD) 120 MG 24 hr capsule Take 1 capsule (120 mg total) by mouth daily.   Docusate Sodium (EQ STOOL SOFTENER PO) Take 100 mg by mouth daily as needed (constipation).   doxylamine, Sleep, (UNISOM) 25 MG tablet Take 25 mg by mouth at bedtime as needed for sleep.    hydrochlorothiazide (HYDRODIURIL) 25 MG tablet Take 1 tablet (25 mg total) by mouth daily.   nitroGLYCERIN (NITROSTAT) 0.4 MG SL tablet Place 1 tablet (0.4 mg total) under the tongue every 5 (five) minutes as needed for chest pain (max 3 doses in 15 minutes).   polyethylene glycol (MIRALAX / GLYCOLAX) packet Take 17 g by mouth daily.    potassium chloride (KLOR-CON) 10 MEQ tablet TAKE 1 TABLET EVERY DAY   vitamin B-12 (CYANOCOBALAMIN) 1000 MCG tablet Take 1,000 mcg by mouth daily.   No facility-administered encounter medications on file as of 01/01/2022.    Patient Active Problem List   Diagnosis Date Noted   Cerumen impaction 09/12/2021   Aortic root enlargement ([place]) 01/09/2021   Neck pain 01/18/2020   Left shoulder pain 01/18/2020   Pain in both upper arms 01/18/2020   Thyroid nodule 01/04/2020   Flank pain 11/22/2019   Hand pain, right 10/18/2019   Family history of breast cancer    Family history of prostate cancer    Family history of lung cancer    Carcinoma of upper-outer quadrant of right  breast in female, estrogen receptor positive ([place]) 02/23/2019   Pre-operative clearance 02/21/2019   History of breast cancer 02/21/2019   Medicare annual wellness visit, subsequent 01/05/2019   Encounter for screening mammogram for breast cancer 01/05/2019   Knee pain, left 10/14/2018   Arthritis of left hip 07/29/2018   Bradycardia 07/01/2018   Left ear pain 07/01/2018   Chronic diastolic heart failure ([place]) 12/27/2017   Lumbar spinal stenosis 82/99/3716   Diastolic dysfunction 96/78/9381   Estrogen deficiency 12/16/2016    Routine general medical examination at a health care facility 12/16/2016   CAD (coronary artery disease) 12/04/2016   Mucocele, 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   Priority: High     Long-Range Goal: Development of plan of care to address chronic disease management and/ or care coordination needs.   Start Date: 04/19/2021  Expected End Date: 02/07/2022  Priority: High  Note:   Goals met. Case closed Current Barriers:  Knowledge Deficits related to plan of care for management of CHF and HTN  Chronic Disease Management support and education needs related to CHF and HTN Patient reports her ears are doing better. She reports having follow up with the otolaryngologist.  She states the dizziness has improved and denies any falls.   Patient states her hands are feeling much better. She states she continues to wear night splints.  Patient states she has some mild pain in her right jaw area.  She states she is scheduled for a follow up visit with her primary care provider on 01/08/22.  Patient states her blood pressure and pulse continue to be in the same range: 120/70's to 130/80's with pulse range of 50-60's.  Reviewed care plan goals  with patient and determined goals were met. Patient verbally agreed to case closure for case management services.    RNCM Clinical Goal(s):  Patient will verbalize understanding of plan for management of CHF and HTN take all medications exactly as prescribed and will  call provider for medication related questions attend all scheduled medical appointments:  continue to work with RN Care Manager to address care management and care coordination needs related to  CHF and HTN through collaboration with RN Care manager, provider, and care team.  Patient will obtain scales and weigh daily and record  Interventions: 1:1 collaboration with primary care provider regarding development and update of comprehensive plan of care as evidenced by provider attestation and co-signature Inter-disciplinary care team collaboration (see longitudinal plan of care) Evaluation of current treatment plan related to  self management and patient's adherence to plan as established by provider  Hypertension Interventions:  Goal Met  Last practice recorded BP readings:  BP Readings from Last 3 Encounters:  09/26/21 (!) 144/72  09/12/21 120/78  04/17/21 (!) 183/88  Most recent eGFR/CrCl: No results found for: "EGFR"  No components found for: "CRCL"  Evaluation of current treatment plan related to hypertension self management and patient's adherence to plan as established by provider; Reviewed medications with patient and discussed  importance of compliance; Advised patient, providing education and rationale, to monitor blood pressure 2-3 times per week and record, calling PCP for findings outside established parameters;   Reviewed scheduled/upcoming provider appointments  Advised to continue to follow a low salt diet.   Heart Failure Interventions: Goal Met Reviewed Heart Failure Action Plan Discussed importance of daily weight and encouraged to weigh Discussed the importance of keeping all appointments with provider; Reviewed medications and discussed importance of compliance.  Re-Advised to call and scheduled yearly follow up with cardiologist.   Falls Interventions:  (Status:  Goal Met.)  Discussed  potential causes of falls and Fall prevention strategies Reviewed medications  and discussed potential side effects of medications such as dizziness and frequent urination Advised patient of importance of notifying provider of falls Assessed falls since last outreach with RNCM.  Advised patient to take note of low pulse rates and write down any symptoms if noted. Advised to notify her doctor for pulse rated <50  Patient Goals/Self-Care Activities: Follow up with your doctors as recommended.  Call provider office for new symptoms or concerns  Continue to follow a low salt diet plan  Consider obtaining scales and weighing daily and check your blood pressure at least 1 -2 times per week.  Record your readings for your weight and blood pressure. Continue to take your medications as prescribed and refill timely Continue to wear hand splints at night as recommended.   Call to schedule yearly follow up with cardiologist.  Continue to monitor your pulse rate.  Make note of pulse rate less than 50 and if you are having any symptoms such as dizziness/ lightheadedness.  Report this to your provider as soon as possible.           Plan: No further follow up required: Goals met. Case closed  Quinn Plowman RN,BSN,CCM RN Care Manager Coordinator [place]  [phone]

## 2022-01-01 NOTE — Patient Instructions (Signed)
Visit Information  Talisha AZAIAH MELLO Congratulations on achieving your goals! It was a pleasure working with you, and I hope you continue to make great strides in improving your health.   Thank you for allowing me to share the care management and care coordination services that are available to you as part of your health plan and services through your primary care provider and medical home. Please reach out to me at [phone] if the care management/care coordination team may be of assistance to you in the future.   Quinn Plowman RN,BSN,CCM RN Care Manager Coordinator [place]  [phone]

## 2022-01-07 DIAGNOSIS — I10 Essential (primary) hypertension: Secondary | ICD-10-CM | POA: Diagnosis not present

## 2022-01-07 DIAGNOSIS — I5032 Chronic diastolic (congestive) heart failure: Secondary | ICD-10-CM | POA: Diagnosis not present

## 2022-01-08 ENCOUNTER — Ambulatory Visit: Payer: Medicare HMO | Admitting: Family Medicine

## 2022-01-17 ENCOUNTER — Ambulatory Visit (INDEPENDENT_AMBULATORY_CARE_PROVIDER_SITE_OTHER): Payer: Medicare HMO

## 2022-01-17 VITALS — Wt 148.0 lb

## 2022-01-17 DIAGNOSIS — Z0000 Encounter for general adult medical examination without abnormal findings: Secondary | ICD-10-CM | POA: Diagnosis not present

## 2022-01-17 NOTE — Patient Instructions (Signed)
Sara [name] , Thank you for taking time to come for your Medicare Wellness Visit. I appreciate your ongoing commitment to your health goals. Please review the following plan we discussed and let me know if I can assist you in the future.   Screening recommendations/referrals: Colonoscopy: aged out Mammogram: aged out Bone Density: aged out Recommended yearly ophthalmology/optometry visit for glaucoma screening and checkup Recommended yearly dental visit for hygiene and checkup  Vaccinations: Influenza vaccine: 03/17/21 Pneumococcal vaccine: 12/16/16 Tdap vaccine: 03/23/1998, due if have injury Shingles vaccine: Shingrix 12/25/17, need 2nd shot   Covid-19:06/25/19, 07/16/19, 03/11/20, 03/17/21, 10/14/20  Advanced directives: no  Conditions/risks identified: none  Next appointment: Follow up in one year for your annual wellness visit 01/20/23 @ 11:30 am by phone   Preventive Care 65 Years and Older, Female Preventive care refers to lifestyle choices and visits with your health care provider that can promote health and wellness. What does preventive care include? A yearly physical exam. This is also called an annual well check. Dental exams once or twice a year. Routine eye exams. Ask your health care provider how often you should have your eyes checked. Personal lifestyle choices, including: Daily care of your teeth and gums. Regular physical activity. Eating a healthy diet. Avoiding tobacco and drug use. Limiting alcohol use. Practicing safe sex. Taking low-dose aspirin every day. Taking vitamin and mineral supplements as recommended by your health care provider. What happens during an annual well check? The services and screenings done by your health care provider during your annual well check will depend on your age, overall health, lifestyle risk factors, and family history of disease. Counseling  Your health care provider may ask you questions about your: Alcohol use. Tobacco  use. Drug use. Emotional well-being. Home and relationship well-being. Sexual activity. Eating habits. History of falls. Memory and ability to understand (cognition). Work and work Statistician. Reproductive health. Screening  You may have the following tests or measurements: Height, weight, and BMI. Blood pressure. Lipid and cholesterol levels. These may be checked every 5 years, or more frequently if you are over 68 years old. Skin check. Lung cancer screening. You may have this screening every year starting at age 2 if you have a 30-pack-year history of smoking and currently smoke or have quit within the past 15 years. Fecal occult blood test (FOBT) of the stool. You may have this test every year starting at age 32. Flexible sigmoidoscopy or colonoscopy. You may have a sigmoidoscopy every 5 years or a colonoscopy every 10 years starting at age 39. Hepatitis C blood test. Hepatitis B blood test. Sexually transmitted disease (STD) testing. Diabetes screening. This is done by checking your blood sugar (glucose) after you have not eaten for a while (fasting). You may have this done every 1-3 years. Bone density scan. This is done to screen for osteoporosis. You may have this done starting at age 72. Mammogram. This may be done every 1-2 years. Talk to your health care provider about how often you should have regular mammograms. Talk with your health care provider about your test results, treatment options, and if necessary, the need for more tests. Vaccines  Your health care provider may recommend certain vaccines, such as: Influenza vaccine. This is recommended every year. Tetanus, diphtheria, and acellular pertussis (Tdap, Td) vaccine. You may need a Td booster every 10 years. Zoster vaccine. You may need this after age 59. Pneumococcal 13-valent conjugate (PCV13) vaccine. One dose is recommended after age 45. Pneumococcal polysaccharide (PPSV23)  vaccine. One dose is recommended  after age 54. Talk to your health care provider about which screenings and vaccines you need and how often you need them. This information is not intended to replace advice given to you by your health care provider. Make sure you discuss any questions you have with your health care provider. Document Released: 06/23/2015 Document Revised: 02/14/2016 Document Reviewed: 03/28/2015 Elsevier Interactive Patient Education  2017 [place] Prevention in the Home Falls can cause injuries. They can happen to people of all ages. There are many things you can do to make your home safe and to help prevent falls. What can I do on the outside of my home? Regularly fix the edges of walkways and driveways and fix any cracks. Remove anything that might make you trip as you walk through a door, such as a raised step or threshold. Trim any bushes or trees on the path to your home. Use bright outdoor lighting. Clear any walking paths of anything that might make someone trip, such as rocks or tools. Regularly check to see if handrails are loose or broken. Make sure that both sides of any steps have handrails. Any raised decks and porches should have guardrails on the edges. Have any leaves, snow, or ice cleared regularly. Use sand or salt on walking paths during winter. Clean up any spills in your garage right away. This includes oil or grease spills. What can I do in the bathroom? Use night lights. Install grab bars by the toilet and in the tub and shower. Do not use towel bars as grab bars. Use non-skid mats or decals in the tub or shower. If you need to sit down in the shower, use a plastic, non-slip stool. Keep the floor dry. Clean up any water that spills on the floor as soon as it happens. Remove soap buildup in the tub or shower regularly. Attach bath mats securely with double-sided non-slip rug tape. Do not have throw rugs and other things on the floor that can make you trip. What can I do  in the bedroom? Use night lights. Make sure that you have a light by your bed that is easy to reach. Do not use any sheets or blankets that are too big for your bed. They should not hang down onto the floor. Have a firm chair that has side arms. You can use this for support while you get dressed. Do not have throw rugs and other things on the floor that can make you trip. What can I do in the kitchen? Clean up any spills right away. Avoid walking on wet floors. Keep items that you use a lot in easy-to-reach places. If you need to reach something above you, use a strong step stool that has a grab bar. Keep electrical cords out of the way. Do not use floor polish or wax that makes floors slippery. If you must use wax, use non-skid floor wax. Do not have throw rugs and other things on the floor that can make you trip. What can I do with my stairs? Do not leave any items on the stairs. Make sure that there are handrails on both sides of the stairs and use them. Fix handrails that are broken or loose. Make sure that handrails are as long as the stairways. Check any carpeting to make sure that it is firmly attached to the stairs. Fix any carpet that is loose or worn. Avoid having throw rugs at the top or bottom  of the stairs. If you do have throw rugs, attach them to the floor with carpet tape. Make sure that you have a light switch at the top of the stairs and the bottom of the stairs. If you do not have them, ask someone to add them for you. What else can I do to help prevent falls? Wear shoes that: Do not have high heels. Have rubber bottoms. Are comfortable and fit you well. Are closed at the toe. Do not wear sandals. If you use a stepladder: Make sure that it is fully opened. Do not climb a closed stepladder. Make sure that both sides of the stepladder are locked into place. Ask someone to hold it for you, if possible. Clearly mark and make sure that you can see: Any grab bars or  handrails. First and last steps. Where the edge of each step is. Use tools that help you move around (mobility aids) if they are needed. These include: Canes. Walkers. Scooters. Crutches. Turn on the lights when you go into a dark area. Replace any light bulbs as soon as they burn out. Set up your furniture so you have a clear path. Avoid moving your furniture around. If any of your floors are uneven, fix them. If there are any pets around you, be aware of where they are. Review your medicines with your doctor. Some medicines can make you feel dizzy. This can increase your chance of falling. Ask your doctor what other things that you can do to help prevent falls. This information is not intended to replace advice given to you by your health care provider. Make sure you discuss any questions you have with your health care provider. Document Released: 03/23/2009 Document Revised: 11/02/2015 Document Reviewed: 07/01/2014 Elsevier Interactive Patient Education  2017 [employer].

## 2022-01-17 NOTE — Progress Notes (Signed)
Virtual Visit via Telephone Note  I connected with  Sara [name] on 01/17/22 at 11:00 AM EDT by telephone and verified that I am speaking with the correct person using two identifiers.  Location: Patient: home Provider: [place] Persons participating in the virtual visit: [place]   I discussed the limitations, risks, security and privacy concerns of performing an evaluation and management service by telephone and the availability of in person appointments. The patient expressed understanding and agreed to proceed.  Interactive audio and video telecommunications were attempted between this nurse and patient, however failed, due to patient having technical difficulties OR patient did not have access to video capability.  We continued and completed visit with audio only.  Some vital signs may be absent or patient reported.   Dionisio David, LPN  Subjective:   Sara [name] is a 88 y.o. female who presents for Medicare Annual (Subsequent) preventive examination.  Review of Systems           Objective:    There were no vitals filed for this visit. There is no height or weight on file to calculate BMI.     01/17/2022   11:02 AM 08/14/2021   10:46 AM 12/22/2020   10:24 AM 04/26/2020    4:15 PM 11/16/2019    3:03 PM 04/28/2019    9:05 AM 04/02/2019    8:54 AM  Advanced Directives  Does Patient Have a Medical Advance Directive? No Yes No No No No No  Would patient like information on creating a medical advance directive? No - Patient declined  No - Patient declined Yes (MAU/Ambulatory/Procedural Areas - Information given)  No - Patient declined No - Patient declined    Current Medications (verified) Outpatient Encounter Medications as of 01/17/2022  Medication Sig   acetaminophen (TYLENOL) 500 MG tablet Take 1,000 mg by mouth every 6 (six) hours as needed.   anastrozole (ARIMIDEX) 1 MG tablet Take 1 mg by mouth daily.   aspirin EC 81  MG tablet Take 81 mg by mouth daily.     Cholecalciferol (VITAMIN D-3) 1000 UNITS CAPS Take 1,000 Units by mouth daily.    diltiazem (CARDIZEM CD) 120 MG 24 hr capsule Take 1 capsule (120 mg total) by mouth daily.   Docusate Sodium (EQ STOOL SOFTENER PO) Take 100 mg by mouth daily as needed (constipation).   doxylamine, Sleep, (UNISOM) 25 MG tablet Take 25 mg by mouth at bedtime as needed for sleep.    hydrochlorothiazide (HYDRODIURIL) 25 MG tablet Take 1 tablet (25 mg total) by mouth daily.   nitroGLYCERIN (NITROSTAT) 0.4 MG SL tablet Place 1 tablet (0.4 mg total) under the tongue every 5 (five) minutes as needed for chest pain (max 3 doses in 15 minutes).   polyethylene glycol (MIRALAX / GLYCOLAX) packet Take 17 g by mouth daily.    potassium chloride (KLOR-CON) 10 MEQ tablet TAKE 1 TABLET EVERY DAY   amoxicillin (AMOXIL) 500 MG capsule  (Patient not taking: Reported on 01/17/2022)   vitamin B-12 (CYANOCOBALAMIN) 1000 MCG tablet Take 1,000 mcg by mouth daily. (Patient not taking: Reported on 01/17/2022)   No facility-administered encounter medications on file as of 01/17/2022.    Allergies (verified) Amoxicillin, Atorvastatin, Cefpodoxime proxetil, Clarithromycin, Fosamax [alendronate sodium], Furosemide, and Gabapentin   History: Past Medical History:  Diagnosis Date   Allergy    CAD (coronary artery disease)    Chronic diastolic (congestive) heart failure ([place])    Clinical trial participant  [facility] studies in familial dementia   Dyspnea 11/28/2016   Family history of breast cancer    Family history of lung cancer    Family history of prostate cancer    Frequent UTI    GERD (gastroesophageal reflux disease)    Hypertension    Osteoarthritis    hips, back   Osteopenia    Pneumonia    Spinal stenosis    Stroke ([place]) 1985   Past Surgical History:  Procedure Laterality Date   ABDOMINAL HYSTERECTOMY     partial has one ovary   APPENDECTOMY     BREAST LUMPECTOMY  WITH RADIOACTIVE SEED LOCALIZATION Right 04/07/2019   Procedure: RIGHT BREAST LUMPECTOMY WITH RADIOACTIVE SEED LOCALIZATION;  Surgeon: Jovita Kussmaul, MD;  Location: [place];  Service: General;  Laterality: Right;   CATARACT EXTRACTION Bilateral    LEFT HEART CATH AND CORONARY ANGIOGRAPHY N/A 11/29/2016   Procedure: Left Heart Cath and Coronary Angiography;  Surgeon: Sherren Mocha, MD;  Location: [place] [facility];  Service: Cardiovascular;  Laterality: N/A;   LUMBAR DISC SURGERY     x 3   rotator cuff surgery Right 2010   Family History  Problem Relation Age of Onset   Lung cancer Brother 62   Other Mother        kidney tumor   Hypertension Mother    Diabetes Sister    Diabetes Brother    Diabetes Sister        x 2   Colon cancer Maternal Uncle    Other Sister        intestine burst   Breast cancer Maternal Aunt        x 2   Breast cancer Daughter        unknown cancer   Diabetes Son        x 3   Diabetes Daughter    Breast cancer Sister        x2, diagnosed in 16s and 73s   Prostate cancer Maternal Uncle    Esophageal cancer Neg Hx    Rectal cancer Neg Hx    Stomach cancer Neg Hx    Social History   Socioeconomic History   Marital status: Married    Spouse name: Not on file   Number of children: 6   Years of education: Not on file   Highest education level: Not on file  Occupational History   Occupation: RETIRED    Employer: RETIRED  Tobacco Use   Smoking status: Never   Smokeless tobacco: Never  Vaping Use   Vaping Use: Never used  Substance and Sexual Activity   Alcohol use: No    Alcohol/week: 0.0 standard drinks of alcohol   Drug use: No   Sexual activity: Not on file  Other Topics Concern   Not on file  Social History Narrative   Not on file   Social Determinants of Health   Financial Resource Strain: Low Risk  (01/17/2022)   Overall Financial Resource Strain (CARDIA)    Difficulty of Paying Living Expenses: Not hard at all  Food Insecurity: No  Food Insecurity (01/17/2022)   Hunger Vital Sign    Worried About Running Out of Food in the Last Year: Never true    Ran Out of Food in the Last Year: Never true  Transportation Needs: No Transportation Needs (01/17/2022)   PRAPARE - Hydrologist (Medical): No    Lack of Transportation (Non-Medical): No  Physical Activity: Insufficiently Active (01/17/2022)   Exercise Vital Sign    Days of Exercise per Week: 3 days    Minutes of Exercise per Session: 30 min  Stress: No Stress Concern Present (01/17/2022)   [place]    Feeling of Stress : Not at all  Social Connections: Moderately Isolated (01/17/2022)   Social Connection and Isolation Panel [NHANES]    Frequency of Communication with Friends and Family: More than three times a week    Frequency of Social Gatherings with Friends and Family: Three times a week    Attends Religious Services: More than 4 times per year    Active Member of Clubs or Organizations: No    Attends Archivist Meetings: Never    Marital Status: Widowed    Tobacco Counseling Counseling given: Not Answered   Clinical Intake:  Pre-visit preparation completed: Yes  Pain : No/denies pain     Nutritional Risks: None Diabetes: No  How often do you need to have someone help you when you read instructions, pamphlets, or other written materials from your doctor or pharmacy?: 1 - Never  Diabetic?no  Interpreter Needed?: No  Information entered by :: Kirke Shaggy, LPN   Activities of Daily Living     No data to display          Patient Care Team: Tower, Wynelle Fanny, MD as PCP - General Marlou Porch Thana Farr, MD as PCP - Cardiology (Cardiology) Jovita Kussmaul, MD as Consulting Physician (General Surgery) Eppie Gibson, MD as Attending Physician (Radiation Oncology) Nicholas Lose, MD as Consulting Physician (Hematology and Oncology) Dannielle Karvonen, RN as  Case Manager  Indicate any recent Medical Services you may have received from other than [facility] providers in the past year (date may be approximate).     Assessment:   This is a routine wellness examination for Sara [name].  Hearing/Vision screen Hearing Screening - Comments:: Wearing aids Vision Screening - Comments:: Wears glasses- Dr.McFarland  Dietary issues and exercise activities discussed:     Goals Addressed             This Visit's Progress    DIET - EAT MORE FRUITS AND VEGETABLES         Depression Screen    01/17/2022   11:00 AM 01/09/2021    9:27 AM 12/22/2020   10:40 AM 01/05/2019    9:33 AM 10/14/2018   11:49 AM 12/18/2017   10:47 AM 12/16/2016    3:35 PM  PHQ 2/9 Scores  PHQ - 2 Score 0 0 0 0 1 0 0  PHQ- 9 Score 0    5 0 4    Fall Risk    01/17/2022   11:03 AM 07/24/2021    9:59 AM 07/24/2021    9:58 AM 01/09/2021    9:26 AM 12/22/2020   10:40 AM  Fall Risk   Falls in the past year? 1  1 0 0  Number falls in past yr: 0  1  0  Injury with Fall? 0 0     Risk for fall due to : History of fall(s) History of fall(s);Impaired mobility     Risk for fall due to: Comment  uses cane for ambulating outside     Follow up Falls evaluation completed;Falls prevention discussed Falls prevention discussed  Falls evaluation completed     FALL RISK PREVENTION PERTAINING TO THE HOME:  Any stairs in or around the home? No  If so, are there any without handrails? No  Home free of loose throw rugs in walkways, pet beds, electrical cords, etc? Yes  Adequate lighting in your home to reduce risk of falls? Yes   ASSISTIVE DEVICES UTILIZED TO PREVENT FALLS:  Life alert? No  Use of a cane, walker or w/c? Yes  Grab bars in the bathroom? No  Shower chair or bench in shower? No  Elevated toilet seat or a handicapped toilet? No    Cognitive Function:not completed     12/18/2017   10:45 AM  MMSE - Mini Mental State Exam  Orientation to time 5  Orientation to Place 5   Registration 3  Attention/ Calculation 0  Recall 1  Recall-comments unable to recall 2 of 3 words  Language- name 2 objects 0  Language- repeat 1  Language- follow 3 step command 3  Language- read & follow direction 0  Write a sentence 0  Copy design 0  Total score 18        Immunizations Immunization History  Administered Date(s) Administered   Fluad Quad(high Dose 65+) 02/19/2019   Influenza Split 05/20/2011   Influenza Whole 03/25/2006, 04/07/2008, 04/11/2009, 03/08/2010, 03/04/2012   Influenza,inj,Quad PF,6+ Mos 03/05/2013, 02/11/2014, 03/06/2015, 03/15/2016, 03/11/2017, 02/19/2020   Influenza-Unspecified 03/08/2018, 03/17/2021   PFIZER Comirnaty(Gray Top)Covid-19 Tri-Sucrose Vaccine 10/14/2020   PFIZER(Purple Top)SARS-COV-2 Vaccination 06/25/2019, 07/16/2019, 03/11/2020, 03/17/2021   Pneumococcal Conjugate-13 12/16/2016   Pneumococcal Polysaccharide-23 02/09/1996, 06/16/2013   Td 03/23/1998   Zoster Recombinat (Shingrix) 12/25/2017    TDAP status: Due, Education has been provided regarding the importance of this vaccine. Advised may receive this vaccine at local pharmacy or Health Dept. Aware to provide a copy of the vaccination record if obtained from local pharmacy or Health Dept. Verbalized acceptance and understanding.  Flu Vaccine status: Up to date  Pneumococcal vaccine status: Up to date  Covid-19 vaccine status: Completed vaccines  Qualifies for Shingles Vaccine? Yes   Zostavax completed No   Shingrix Completed?: No.    Education has been provided regarding the importance of this vaccine. Patient has been advised to call insurance company to determine out of pocket expense if they have not yet received this vaccine. Advised may also receive vaccine at local pharmacy or Health Dept. Verbalized acceptance and understanding.  Screening Tests Health Maintenance  Topic Date Due   Zoster Vaccines- Shingrix (2 of 2) 02/19/2018   COVID-19 Vaccine (6 - Pfizer risk  series) 05/12/2021   INFLUENZA VACCINE  01/08/2022   TETANUS/TDAP  01/04/2029 (Originally 03/23/2008)   MAMMOGRAM  03/05/2022   Pneumonia Vaccine 35+ Years old  Completed   DEXA SCAN  Completed   HPV VACCINES  Aged Out    Health Maintenance  Health Maintenance Due  Topic Date Due   Zoster Vaccines- Shingrix (2 of 2) 02/19/2018   COVID-19 Vaccine (6 - Pfizer risk series) 05/12/2021   INFLUENZA VACCINE  01/08/2022    Colorectal cancer screening: No longer required.   Mammogram status: No longer required due to age.  Bone Density status: Completed 03/05/21. Results reflect: Bone density results: OSTEOPOROSIS. Repeat every 2 years.  Lung Cancer Screening: (Low Dose CT Chest recommended if Age 31-80 years, 30 pack-year currently smoking OR have quit w/in 15years.) does not qualify.    Additional Screening:  Hepatitis C Screening: does not qualify; Completed no  Vision Screening: Recommended annual ophthalmology exams for early detection of glaucoma and other disorders of the eye. Is the patient up to date with their annual  eye exam?  Yes  Who is the provider or what is the name of the office in which the patient attends annual eye exams? Dr.McFarland If pt is not established with a provider, would they like to be referred to a provider to establish care? No .   Dental Screening: Recommended annual dental exams for proper oral hygiene  Community Resource Referral / Chronic Care Management: CRR required this visit?  No   CCM required this visit?  No      Plan:     I have personally reviewed and noted the following in the patient's chart:   Medical and social history Use of alcohol, tobacco or illicit drugs  Current medications and supplements including opioid prescriptions.  Functional ability and status Nutritional status Physical activity Advanced directives List of other physicians Hospitalizations, surgeries, and ER visits in previous 12  months Vitals Screenings to include cognitive, depression, and falls Referrals and appointments  In addition, I have reviewed and discussed with patient certain preventive protocols, quality metrics, and best practice recommendations. A written personalized care plan for preventive services as well as general preventive health recommendations were provided to patient.     Dionisio David, LPN   [id number]   Nurse Notes: none

## 2022-01-24 NOTE — Progress Notes (Signed)
Virtual Visit via Telephone Note  I connected with  Sara [name] on 01/24/22 at 11:00 AM EDT by telephone and verified that I am speaking with the correct person using two identifiers.  Location: Patient: home Provider: [place] Persons participating in the virtual visit: [place]   I discussed the limitations, risks, security and privacy concerns of performing an evaluation and management service by telephone and the availability of in person appointments. The patient expressed understanding and agreed to proceed.  Interactive audio and video telecommunications were attempted between this nurse and patient, however failed, due to patient having technical difficulties OR patient did not have access to video capability.  We continued and completed visit with audio only.  Some vital signs may be absent or patient reported.   Dionisio David, LPN  Subjective:   Sara [name] is a 88 y.o. female who presents for Medicare Annual (Subsequent) preventive examination.  Review of Systems     Cardiac Risk Factors include: advanced age (>70mn, >>81women);hypertension;dyslipidemia     Objective:    Today's Vitals   01/17/22 1113  Weight: 148 lb (67.1 kg)   Body mass index is 27.96 kg/m.     01/17/2022   11:02 AM 08/14/2021   10:46 AM 12/22/2020   10:24 AM 04/26/2020    4:15 PM 11/16/2019    3:03 PM 04/28/2019    9:05 AM 04/02/2019    8:54 AM  Advanced Directives  Does Patient Have a Medical Advance Directive? No Yes No No No No No  Would patient like information on creating a medical advance directive? No - Patient declined  No - Patient declined Yes (MAU/Ambulatory/Procedural Areas - Information given)  No - Patient declined No - Patient declined    Current Medications (verified) Outpatient Encounter Medications as of 01/17/2022  Medication Sig   acetaminophen (TYLENOL) 500 MG tablet Take 1,000 mg by mouth every 6 (six) hours as  needed.   anastrozole (ARIMIDEX) 1 MG tablet Take 1 mg by mouth daily.   aspirin EC 81 MG tablet Take 81 mg by mouth daily.     Cholecalciferol (VITAMIN D-3) 1000 UNITS CAPS Take 1,000 Units by mouth daily.    diltiazem (CARDIZEM CD) 120 MG 24 hr capsule Take 1 capsule (120 mg total) by mouth daily.   Docusate Sodium (EQ STOOL SOFTENER PO) Take 100 mg by mouth daily as needed (constipation).   doxylamine, Sleep, (UNISOM) 25 MG tablet Take 25 mg by mouth at bedtime as needed for sleep.    hydrochlorothiazide (HYDRODIURIL) 25 MG tablet Take 1 tablet (25 mg total) by mouth daily.   nitroGLYCERIN (NITROSTAT) 0.4 MG SL tablet Place 1 tablet (0.4 mg total) under the tongue every 5 (five) minutes as needed for chest pain (max 3 doses in 15 minutes).   polyethylene glycol (MIRALAX / GLYCOLAX) packet Take 17 g by mouth daily.    potassium chloride (KLOR-CON) 10 MEQ tablet TAKE 1 TABLET EVERY DAY   amoxicillin (AMOXIL) 500 MG capsule  (Patient not taking: Reported on 01/17/2022)   vitamin B-12 (CYANOCOBALAMIN) 1000 MCG tablet Take 1,000 mcg by mouth daily. (Patient not taking: Reported on 01/17/2022)   No facility-administered encounter medications on file as of 01/17/2022.    Allergies (verified) Amoxicillin, Atorvastatin, Cefpodoxime proxetil, Clarithromycin, Fosamax [alendronate sodium], Furosemide, and Gabapentin   History: Past Medical History:  Diagnosis Date   Allergy    CAD (coronary artery disease)    Chronic diastolic (congestive) heart failure ([place]  Clinical trial participant    [facility] studies in familial dementia   Dyspnea 11/28/2016   Family history of breast cancer    Family history of lung cancer    Family history of prostate cancer    Frequent UTI    GERD (gastroesophageal reflux disease)    Hypertension    Osteoarthritis    hips, back   Osteopenia    Pneumonia    Spinal stenosis    Stroke ([place]) 1985   Past Surgical History:  Procedure Laterality Date    ABDOMINAL HYSTERECTOMY     partial has one ovary   APPENDECTOMY     BREAST LUMPECTOMY WITH RADIOACTIVE SEED LOCALIZATION Right 04/07/2019   Procedure: RIGHT BREAST LUMPECTOMY WITH RADIOACTIVE SEED LOCALIZATION;  Surgeon: Jovita Kussmaul, MD;  Location: [facility];  Service: General;  Laterality: Right;   CATARACT EXTRACTION Bilateral    LEFT HEART CATH AND CORONARY ANGIOGRAPHY N/A 11/29/2016   Procedure: Left Heart Cath and Coronary Angiography;  Surgeon: Sherren Mocha, MD;  Location: [facility];  Service: Cardiovascular;  Laterality: N/A;   LUMBAR DISC SURGERY     x 3   rotator cuff surgery Right 2010   Family History  Problem Relation Age of Onset   Lung cancer Brother 63   Other Mother        kidney tumor   Hypertension Mother    Diabetes Sister    Diabetes Brother    Diabetes Sister        x 2   Colon cancer Maternal Uncle    Other Sister        intestine burst   Breast cancer Maternal Aunt        x 2   Breast cancer Daughter        unknown cancer   Diabetes Son        x 3   Diabetes Daughter    Breast cancer Sister        x2, diagnosed in 23s and 66s   Prostate cancer Maternal Uncle    Esophageal cancer Neg Hx    Rectal cancer Neg Hx    Stomach cancer Neg Hx    Social History   Socioeconomic History   Marital status: Married    Spouse name: Not on file   Number of children: 6   Years of education: Not on file   Highest education level: Not on file  Occupational History   Occupation: RETIRED    Employer: RETIRED  Tobacco Use   Smoking status: Never   Smokeless tobacco: Never  Vaping Use   Vaping Use: Never used  Substance and Sexual Activity   Alcohol use: No    Alcohol/week: 0.0 standard drinks of alcohol   Drug use: No   Sexual activity: Not on file  Other Topics Concern   Not on file  Social History Narrative   Not on file   Social Determinants of Health   Financial Resource Strain: Low Risk  (01/17/2022)   Overall Financial Resource Strain  (CARDIA)    Difficulty of Paying Living Expenses: Not hard at all  Food Insecurity: No Food Insecurity (01/17/2022)   Hunger Vital Sign    Worried About Running Out of Food in the Last Year: Never true    Ran Out of Food in the Last Year: Never true  Transportation Needs: No Transportation Needs (01/17/2022)   PRAPARE - Hydrologist (Medical): No  Lack of Transportation (Non-Medical): No  Physical Activity: Insufficiently Active (01/17/2022)   Exercise Vital Sign    Days of Exercise per Week: 3 days    Minutes of Exercise per Session: 30 min  Stress: No Stress Concern Present (01/17/2022)   [place]    Feeling of Stress : Not at all  Social Connections: Moderately Isolated (01/17/2022)   Social Connection and Isolation Panel [NHANES]    Frequency of Communication with Friends and Family: More than three times a week    Frequency of Social Gatherings with Friends and Family: Three times a week    Attends Religious Services: More than 4 times per year    Active Member of Clubs or Organizations: No    Attends Archivist Meetings: Never    Marital Status: Widowed    Tobacco Counseling Counseling given: Not Answered   Clinical Intake:  Pre-visit preparation completed: Yes  Pain : No/denies pain     Nutritional Risks: None Diabetes: No  How often do you need to have someone help you when you read instructions, pamphlets, or other written materials from your doctor or pharmacy?: 1 - Never  Diabetic?no  Interpreter Needed?: No  Information entered by :: Kirke Shaggy, LPN   Activities of Daily Living    01/17/2022   11:04 AM  In your present state of health, do you have any difficulty performing the following activities:  Hearing? 1  Vision? 0  Difficulty concentrating or making decisions? 0  Walking or climbing stairs? 0  Dressing or bathing? 0  Doing errands,  shopping? 0  Preparing Food and eating ? N  Using the Toilet? N  In the past six months, have you accidently leaked urine? N  Do you have problems with loss of bowel control? N  Managing your Medications? N  Managing your Finances? N  Housekeeping or managing your Housekeeping? N    Patient Care Team: Tower, Wynelle Fanny, MD as PCP - General Marlou Porch Thana Farr, MD as PCP - Cardiology (Cardiology) Jovita Kussmaul, MD as Consulting Physician (General Surgery) Eppie Gibson, MD as Attending Physician (Radiation Oncology) Nicholas Lose, MD as Consulting Physician (Hematology and Oncology) Dannielle Karvonen, RN as Case Manager  Indicate any recent Medical Services you may have received from other than [facility] providers in the past year (date may be approximate).     Assessment:   This is a routine wellness examination for Sara [name].  Hearing/Vision screen Hearing Screening - Comments:: Wearing aids Vision Screening - Comments:: Wears glasses- Dr.McFarland  Dietary issues and exercise activities discussed: Current Exercise Habits: Home exercise routine, Type of exercise: walking, Time (Minutes): 30, Frequency (Times/Week): 3, Weekly Exercise (Minutes/Week): 90, Intensity: Mild   Goals Addressed             This Visit's Progress    DIET - EAT MORE FRUITS AND VEGETABLES        Depression Screen    01/17/2022   11:00 AM 01/09/2021    9:27 AM 12/22/2020   10:40 AM 01/05/2019    9:33 AM 10/14/2018   11:49 AM 12/18/2017   10:47 AM 12/16/2016    3:35 PM  PHQ 2/9 Scores  PHQ - 2 Score 0 0 0 0 1 0 0  PHQ- 9 Score 0    5 0 4    Fall Risk    01/17/2022   11:03 AM 07/24/2021    9:59 AM 07/24/2021  9:58 AM 01/09/2021    9:26 AM 12/22/2020   10:40 AM  Fall Risk   Falls in the past year? 1  1 0 0  Number falls in past yr: 0  1  0  Injury with Fall? 0 0     Risk for fall due to : History of fall(s) History of fall(s);Impaired mobility     Risk for fall due to: Comment  uses cane for ambulating  outside     Follow up Falls evaluation completed;Falls prevention discussed Falls prevention discussed  Falls evaluation completed     FALL RISK PREVENTION PERTAINING TO THE HOME:  Any stairs in or around the home? No  If so, are there any without handrails? No  Home free of loose throw rugs in walkways, pet beds, electrical cords, etc? Yes  Adequate lighting in your home to reduce risk of falls? Yes   ASSISTIVE DEVICES UTILIZED TO PREVENT FALLS:  Life alert? No  Use of a cane, walker or w/c? Yes  Grab bars in the bathroom? No  Shower chair or bench in shower? No  Elevated toilet seat or a handicapped toilet? No    Cognitive Function:not completed PT A & O     12/18/2017   10:45 AM  MMSE - Mini Mental State Exam  Orientation to time 5  Orientation to Place 5  Registration 3  Attention/ Calculation 0  Recall 1  Recall-comments unable to recall 2 of 3 words  Language- name 2 objects 0  Language- repeat 1  Language- follow 3 step command 3  Language- read & follow direction 0  Write a sentence 0  Copy design 0  Total score 18        Immunizations Immunization History  Administered Date(s) Administered   Fluad Quad(high Dose 65+) 02/19/2019   Influenza Split 05/20/2011   Influenza Whole 03/25/2006, 04/07/2008, 04/11/2009, 03/08/2010, 03/04/2012   Influenza,inj,Quad PF,6+ Mos 03/05/2013, 02/11/2014, 03/06/2015, 03/15/2016, 03/11/2017, 02/19/2020   Influenza-Unspecified 03/08/2018, 03/17/2021   PFIZER Comirnaty(Gray Top)Covid-19 Tri-Sucrose Vaccine 10/14/2020   PFIZER(Purple Top)SARS-COV-2 Vaccination 06/25/2019, 07/16/2019, 03/11/2020, 03/17/2021   Pneumococcal Conjugate-13 12/16/2016   Pneumococcal Polysaccharide-23 02/09/1996, 06/16/2013   Td 03/23/1998   Zoster Recombinat (Shingrix) 12/25/2017    TDAP status: Due, Education has been provided regarding the importance of this vaccine. Advised may receive this vaccine at local pharmacy or Health Dept. Aware to  provide a copy of the vaccination record if obtained from local pharmacy or Health Dept. Verbalized acceptance and understanding.  Flu Vaccine status: Up to date  Pneumococcal vaccine status: Up to date  Covid-19 vaccine status: Completed vaccines  Qualifies for Shingles Vaccine? Yes   Zostavax completed No   Shingrix Completed?: No.    Education has been provided regarding the importance of this vaccine. Patient has been advised to call insurance company to determine out of pocket expense if they have not yet received this vaccine. Advised may also receive vaccine at local pharmacy or Health Dept. Verbalized acceptance and understanding.  Screening Tests Health Maintenance  Topic Date Due   Zoster Vaccines- Shingrix (2 of 2) 02/19/2018   COVID-19 Vaccine (6 - Pfizer risk series) 05/12/2021   INFLUENZA VACCINE  01/08/2022   TETANUS/TDAP  01/04/2029 (Originally 03/23/2008)   MAMMOGRAM  03/05/2022   Pneumonia Vaccine 35+ Years old  Completed   DEXA SCAN  Completed   HPV VACCINES  Aged Out    Health Maintenance  Health Maintenance Due  Topic Date Due   Zoster Vaccines- Shingrix (  2 of 2) 02/19/2018   COVID-19 Vaccine (6 - Pfizer risk series) 05/12/2021   INFLUENZA VACCINE  01/08/2022    Colorectal cancer screening: No longer required.   Mammogram status: No longer required due to age.  Bone Density status: Completed 03/05/21. Results reflect: Bone density results: OSTEOPOROSIS. Repeat every 2 years.  Lung Cancer Screening: (Low Dose CT Chest recommended if Age 22-80 years, 30 pack-year currently smoking OR have quit w/in 15years.) does not qualify.    Additional Screening:  Hepatitis C Screening: does not qualify; Completed no  Vision Screening: Recommended annual ophthalmology exams for early detection of glaucoma and other disorders of the eye. Is the patient up to date with their annual eye exam?  Yes  Who is the provider or what is the name of the office in which the  patient attends annual eye exams? Dr.McFarland If pt is not established with a provider, would they like to be referred to a provider to establish care? No .   Dental Screening: Recommended annual dental exams for proper oral hygiene  Community Resource Referral / Chronic Care Management: CRR required this visit?  No   CCM required this visit?  No      Plan:     I have personally reviewed and noted the following in the patient's chart:   Medical and social history Use of alcohol, tobacco or illicit drugs  Current medications and supplements including opioid prescriptions.  Functional ability and status Nutritional status Physical activity Advanced directives List of other physicians Hospitalizations, surgeries, and ER visits in previous 12 months Vitals Screenings to include cognitive, depression, and falls Referrals and appointments  In addition, I have reviewed and discussed with patient certain preventive protocols, quality metrics, and best practice recommendations. A written personalized care plan for preventive services as well as general preventive health recommendations were provided to patient.     Dionisio David, LPN   [id number]   Nurse Notes: none

## 2022-01-29 ENCOUNTER — Telehealth: Payer: Medicare HMO

## 2022-02-01 ENCOUNTER — Other Ambulatory Visit: Payer: Self-pay | Admitting: Family Medicine

## 2022-02-03 NOTE — Progress Notes (Unsigned)
Name: Sara [name]  MRN/ DOB: [id number], [birth year]    Age/ Sex: 88 y.o., female     PCP: Tower, Wynelle Fanny, MD   Reason for Endocrinology Evaluation: Right thyroid nodule     Initial Endocrinology Clinic Visit: 02/01/2020    PATIENT IDENTIFIER: Sara [name] is a 88 y.o., female with a past medical history of HTN, thyroid nodule, history of breast cancer( S/P right lumpectomy, chemo, and radiation in 2020), history of CHF. She has followed with [facility] since 02/01/2020 for consultative assistance with management of her right thyroid nodule.   HISTORICAL SUMMARY:   Patient was noted to have an incidental finding of an asymmetrical thyroid gland on CT Angio in 12/2019 during evaluation of thoracic aneurysm. This prompted a thyroid ultrasound demonstrating 4.3 cm right thyroid nodule meeting criteria for FNA.   She is status post right thyroid nodule 4.3 cm FNA with benign cytology (02/08/2020)  SUBJECTIVE:    Today (02/04/2022):  Sara [name] is here for a follow-up on right thyroid nodule   Weight stable  She denies local neck symptoms but has noted right posterior neck pain  Denies dysphagia  Denies palpitations but has shortness of breath  Has occasional tremors  constipation has resolved with miralax use   She follows with oncology for history of right breast cancer   HISTORY:  Past Medical History:  Past Medical History:  Diagnosis Date   Allergy    CAD (coronary artery disease)    Chronic diastolic (congestive) heart failure ([place])    Clinical trial participant    [facility] Genetic studies in familial dementia   Dyspnea 11/28/2016   Family history of breast cancer    Family history of lung cancer    Family history of prostate cancer    Frequent UTI    GERD (gastroesophageal reflux disease)    Hypertension    Osteoarthritis    hips, back   Osteopenia    Pneumonia    Spinal stenosis    Stroke ([place]) 1985   Past  Surgical History:  Past Surgical History:  Procedure Laterality Date   ABDOMINAL HYSTERECTOMY     partial has one ovary   APPENDECTOMY     BREAST LUMPECTOMY WITH RADIOACTIVE SEED LOCALIZATION Right 04/07/2019   Procedure: RIGHT BREAST LUMPECTOMY WITH RADIOACTIVE SEED LOCALIZATION;  Surgeon: Jovita Kussmaul, MD;  Location: [facility];  Service: General;  Laterality: Right;   CATARACT EXTRACTION Bilateral    LEFT HEART CATH AND CORONARY ANGIOGRAPHY N/A 11/29/2016   Procedure: Left Heart Cath and Coronary Angiography;  Surgeon: Sherren Mocha, MD;  Location: [facility];  Service: Cardiovascular;  Laterality: N/A;   LUMBAR DISC SURGERY     x 3   rotator cuff surgery Right 2010   Social History:  reports that she has never smoked. She has never used smokeless tobacco. She reports that she does not drink alcohol and does not use drugs. Family History:  Family History  Problem Relation Age of Onset   Lung cancer Brother 67   Other Mother        kidney tumor   Hypertension Mother    Diabetes Sister    Diabetes Brother    Diabetes Sister        x 2   Colon cancer Maternal Uncle    Other Sister        intestine burst   Breast cancer Maternal Aunt  x 2   Breast cancer Daughter        unknown cancer   Diabetes Son        x 3   Diabetes Daughter    Breast cancer Sister        x2, diagnosed in 45s and 59s   Prostate cancer Maternal Uncle    Esophageal cancer Neg Hx    Rectal cancer Neg Hx    Stomach cancer Neg Hx      HOME MEDICATIONS: Allergies as of 02/04/2022       Reactions   Amoxicillin    Reaction not known   Atorvastatin    aching   Cefpodoxime Proxetil    Reaction not known   Clarithromycin    Reaction not known   Fosamax [alendronate Sodium]    Body pain    Furosemide    REACTION: doesn't help   Gabapentin Swelling   Caused severe swellling        Medication List        Accurate as of February 04, 2022 10:12 AM. If you have any questions, ask  your nurse or doctor.          STOP taking these medications    amoxicillin 500 MG capsule Commonly known as: AMOXIL Stopped by: Dorita Sciara, MD   anastrozole 1 MG tablet Commonly known as: ARIMIDEX Stopped by: Dorita Sciara, MD       TAKE these medications    acetaminophen 500 MG tablet Commonly known as: TYLENOL Take 1,000 mg by mouth every 6 (six) hours as needed.   aspirin EC 81 MG tablet Take 81 mg by mouth daily.   cyanocobalamin 1000 MCG tablet Commonly known as: VITAMIN B12 Take 1,000 mcg by mouth daily.   diltiazem 120 MG 24 hr capsule Commonly known as: CARDIZEM CD TAKE 1 CAPSULE EVERY DAY   doxylamine (Sleep) 25 MG tablet Commonly known as: UNISOM Take 25 mg by mouth at bedtime as needed for sleep.   EQ STOOL SOFTENER PO Take 100 mg by mouth daily as needed (constipation).   hydrochlorothiazide 25 MG tablet Commonly known as: HYDRODIURIL TAKE 1 TABLET EVERY DAY   nitroGLYCERIN 0.4 MG SL tablet Commonly known as: NITROSTAT Place 1 tablet (0.4 mg total) under the tongue every 5 (five) minutes as needed for chest pain (max 3 doses in 15 minutes).   polyethylene glycol 17 g packet Commonly known as: MIRALAX / GLYCOLAX Take 17 g by mouth daily.   potassium chloride 10 MEQ tablet Commonly known as: KLOR-CON TAKE 1 TABLET EVERY DAY   Vitamin D-3 25 MCG (1000 UT) Caps Take 1,000 Units by mouth daily.          OBJECTIVE:   PHYSICAL EXAM: VS: BP 126/74 (BP Location: Left Arm, Patient Position: Sitting, Cuff Size: Large)   Pulse 84   Ht '5\' 1"'$  (1.549 m)   Wt 146 lb (66.2 kg)   SpO2 97%   BMI 27.59 kg/m    EXAM: General: Pt appears well and is in NAD  Neck: General: Supple without adenopathy. Thyroid: Thyroid size normal.  No goiter or nodules appreciated.  Lungs: Clear with good BS bilat with no rales, rhonchi, or wheezes  Heart: Auscultation: RRR.  Abdomen: Normoactive bowel sounds, soft, nontender, without masses or  organomegaly palpable  Extremities:  BL LE: No pretibial edema normal ROM and strength.  Mental Status: Judgment, insight: Intact Orientation: Oriented to time, place, and person Mood and affect: No depression,  anxiety, or agitation     DATA REVIEWED: Results for MASA, LUBIN (MRN [id number]) as of 02/02/2021 13:58  Ref. Range 01/09/2021 10:11  TSH Latest Ref Range: 0.35 - 5.50 uIU/mL 1.76     Thyroid ultrasound 02/20/2021  Estimated total number of nodules >/= 1 cm: 1   Number of spongiform nodules >/=  2 cm not described below (TR1): 0   Number of mixed cystic and solid nodules >/= 1.5 cm not described below ([place]): 0   _________________________________________________________   Nodule 1: 3.9 x 1.8 x 2.6 cm right mid thyroid nodule is not significantly changed in size since prior examination. Please correlate with prior biopsy results from 02/08/2020.   Nodule 2: Subcentimeter isoechoic solid nodule in the inferior left thyroid lobe does not meet criteria for FNA or imaging surveillance.   IMPRESSION: Previously biopsied right thyroid nodule has not significantly changed in size. Please correlate with prior results.     FNA right thyroid nodule 02/08/2020  Clinical History: Location: Right; Mid, Maximum Size: 4.3 cm; Other 2  dimensions: 3.1 x 2.0 cm, solid/almost completely solid (2), Isoechoic  (1), ACR TI-RADS Total Points 3.  Specimen Submitted:  A. THYROID, RIGHT LOBE, FINE NEEDLE ASPIRATION:    FINAL MICROSCOPIC DIAGNOSIS:  - Consistent with benign follicular nodule ([place] category II)    ASSESSMENT / PLAN / RECOMMENDATIONS:   Right thyroid nodule:   -Patient is clinically and biochemically euthyroid -No local neck symptoms -Status post benign FNA of the right thyroid nodule 01/2020 -We will proceed with a follow-up thyroid ultrasound     Follow-up in 1 year   Signed electronically by: Mack Guise, MD  [facility]  Endocrinology  [address] [place], [place] [address] Phone: [phone] FAX: [phone]      CC: Tower, Wynelle Fanny, MD [place] [address] Phone: [phone]  Fax: [phone]   Return to Endocrinology clinic as below: Future Appointments  Date Time Provider [place]  02/04/2022 10:30 AM Lavaris Sexson, Melanie Crazier, MD [facility] None  02/12/2022 10:30 AM Tower, Wynelle Fanny, MD [facility]  04/15/2022  1:45 PM Gardenia Phlegm, NP [facility] None  01/20/2023 11:30 AM [facility] NURSE HEALTH ADVISOR [facility]

## 2022-02-04 ENCOUNTER — Ambulatory Visit: Payer: Medicare HMO | Admitting: Internal Medicine

## 2022-02-04 ENCOUNTER — Encounter: Payer: Self-pay | Admitting: Internal Medicine

## 2022-02-04 VITALS — BP 126/74 | HR 84 | Ht 61.0 in | Wt 146.0 lb

## 2022-02-04 DIAGNOSIS — E041 Nontoxic single thyroid nodule: Secondary | ICD-10-CM

## 2022-02-04 LAB — TSH: TSH: 1.38 u[IU]/mL (ref 0.35–5.50)

## 2022-02-04 LAB — T4, FREE: Free T4: 0.93 ng/dL (ref 0.60–1.60)

## 2022-02-05 ENCOUNTER — Encounter: Payer: Self-pay | Admitting: Internal Medicine

## 2022-02-12 ENCOUNTER — Ambulatory Visit (INDEPENDENT_AMBULATORY_CARE_PROVIDER_SITE_OTHER): Payer: Medicare HMO | Admitting: Family Medicine

## 2022-02-12 ENCOUNTER — Encounter: Payer: Self-pay | Admitting: Family Medicine

## 2022-02-12 VITALS — BP 132/80 | HR 59 | Temp 97.8°F | Ht 61.0 in | Wt 147.1 lb

## 2022-02-12 DIAGNOSIS — I10 Essential (primary) hypertension: Secondary | ICD-10-CM | POA: Diagnosis not present

## 2022-02-12 DIAGNOSIS — E041 Nontoxic single thyroid nodule: Secondary | ICD-10-CM

## 2022-02-12 DIAGNOSIS — E7800 Pure hypercholesterolemia, unspecified: Secondary | ICD-10-CM | POA: Diagnosis not present

## 2022-02-12 DIAGNOSIS — M810 Age-related osteoporosis without current pathological fracture: Secondary | ICD-10-CM | POA: Diagnosis not present

## 2022-02-12 DIAGNOSIS — R7303 Prediabetes: Secondary | ICD-10-CM

## 2022-02-12 DIAGNOSIS — R2231 Localized swelling, mass and lump, right upper limb: Secondary | ICD-10-CM | POA: Diagnosis not present

## 2022-02-12 LAB — COMPREHENSIVE METABOLIC PANEL
ALT: 10 U/L (ref 0–35)
AST: 16 U/L (ref 0–37)
Albumin: 4 g/dL (ref 3.5–5.2)
Alkaline Phosphatase: 76 U/L (ref 39–117)
BUN: 15 mg/dL (ref 6–23)
CO2: 28 mEq/L (ref 19–32)
Calcium: 10 mg/dL (ref 8.4–10.5)
Chloride: 104 mEq/L (ref 96–112)
Creatinine, Ser: 0.84 mg/dL (ref 0.40–1.20)
GFR: 62.32 mL/min (ref >60.00)
Glucose, Bld: 86 mg/dL (ref 70–99)
Potassium: 4 mEq/L (ref 3.5–5.1)
Sodium: 140 mEq/L (ref 135–145)
Total Bilirubin: 0.5 mg/dL (ref 0.2–1.2)
Total Protein: 7.2 g/dL (ref 6.0–8.3)

## 2022-02-12 LAB — CBC WITH DIFFERENTIAL/PLATELET
Basophils Absolute: 0.1 10*3/uL (ref 0.0–0.1)
Basophils Relative: 1 % (ref 0.0–3.0)
Eosinophils Absolute: 0.1 10*3/uL (ref 0.0–0.7)
Eosinophils Relative: 1 % (ref 0.0–5.0)
HCT: 39.5 % (ref 36.0–46.0)
Hemoglobin: 13 g/dL (ref 12.0–15.0)
Lymphocytes Relative: 30.5 % (ref 12.0–46.0)
Lymphs Abs: 1.8 10*3/uL (ref 0.7–4.0)
MCHC: 32.8 g/dL (ref 30.0–36.0)
MCV: 87.4 fl (ref 78.0–100.0)
Monocytes Absolute: 0.6 10*3/uL (ref 0.1–1.0)
Monocytes Relative: 10.6 % (ref 3.0–12.0)
Neutro Abs: 3.3 10*3/uL (ref 1.4–7.7)
Neutrophils Relative %: 56.9 % (ref 43.0–77.0)
Platelets: 214 10*3/uL (ref 150.0–400.0)
RBC: 4.52 Mil/uL (ref 3.87–5.11)
RDW: 13.5 % (ref 11.5–15.5)
WBC: 5.9 10*3/uL (ref 4.0–10.5)

## 2022-02-12 LAB — LIPID PANEL
Cholesterol: 182 mg/dL (ref 0–200)
HDL: 53.9 mg/dL (ref >39.00)
LDL Cholesterol: 98 mg/dL (ref 0–99)
NonHDL: 128.23
Total CHOL/HDL Ratio: 3
Triglycerides: 149 mg/dL (ref 0.0–149.0)
VLDL: 29.8 mg/dL (ref 0.0–40.0)

## 2022-02-12 LAB — HEMOGLOBIN A1C: Hgb A1c MFr Bld: 6.3 % (ref 4.6–6.5)

## 2022-02-12 NOTE — Patient Instructions (Addendum)
Get your mammogram as planned next month   Labs today  Please start drinking more fluids  If you don't like water  Some diet lemonade is ok but try to get some water also  Caffeine free dilute tea is another option    Labs today   When you get light headed -check your blood pressure and let us know how it is  Be careful of the heat - this can make you light headed also   Labs today

## 2022-02-12 NOTE — Assessment & Plan Note (Signed)
Lab today Disc goals for lipids and reasons to control them Rev last labs with pt Rev low sat fat diet in detail Intolerant of statins Avoids beef but does eat butter frequently

## 2022-02-12 NOTE — Assessment & Plan Note (Signed)
Reviewed recent endocrinology notes  Watching with [place]  No symptoms  Last thyroid numbers were normal

## 2022-02-12 NOTE — Progress Notes (Signed)
Subjective:    Patient ID: Sara [name], female    DOB: [birth year], 88 y.o.   MRN: [id number]  HPI Pt presents for HTN   Wt Readings from Last 3 Encounters:  02/12/22 147 lb 2 oz (66.7 kg)  02/04/22 146 lb (66.2 kg)  01/17/22 148 lb (67.1 kg)   27.80 kg/m    HTN bp is stable today  No cp or palpitations or headaches or edema  No side effects to medicines  BP Readings from Last 3 Encounters:  02/12/22 132/80  02/04/22 126/74  09/26/21 (!) 144/72    Diltiazem 120 mg daily  Hctz 25 mg daily  No changes   Gets occ light headed  No falls  Does not drink enough fluids   Feeling tired at times  Some chronic back pain - limits her activity  Has to take breaks   Sees cardiology  Has DD Watching aortic enl   Pulse Readings from Last 3 Encounters:  02/12/22 (!) 59  02/04/22 84  09/26/21 61   Takes klor con Lab Results  Component Value Date   CREATININE 0.91 01/09/2021   BUN 15 01/09/2021   NA 141 01/09/2021   K 3.9 01/09/2021   CL 101 01/09/2021   CO2 31 01/09/2021   Intol of statins Lab Results  Component Value Date   CHOL 192 01/09/2021   HDL 64.60 01/09/2021   LDLCALC 99 01/09/2021   LDLDIRECT 143.1 06/16/2013   TRIG 143.0 01/09/2021   CHOLHDL 3 01/09/2021   Loves butter - may eat too much  Not a lot of beef  Eats chicken/not fried and some fish   No fried food   Prediabetes Lab Results  Component Value Date   HGBA1C 6.3 01/09/2021   Tries to eat healthy  Loves sugar but tries to limit it  Likes to bake     Dexa 2022 OP Intol or oral bisphos  Prev on anastrozole  Falls-none Fractures-none  Supplements -vit D Exercise -walking when tolerated     She saw Dr Kelton Pillar for thyroid nodule  3.9 cm R thyroid nodule is unchanged (had nl FNA in the past0  Smaller one on the L   Nl TSH and FT4 last month  Will f/u with her in a year    Review of Systems  Constitutional:  Negative for activity change, appetite change,  fatigue, fever and unexpected weight change.  HENT:  Negative for congestion, ear pain, rhinorrhea, sinus pressure and sore throat.   Eyes:  Negative for pain, redness and visual disturbance.  Respiratory:  Negative for cough, shortness of breath and wheezing.   Cardiovascular:  Negative for chest pain and palpitations.  Gastrointestinal:  Negative for abdominal pain, blood in stool, constipation and diarrhea.  Endocrine: Negative for polydipsia and polyuria.  Genitourinary:  Negative for dysuria, frequency and urgency.  Musculoskeletal:  Positive for arthralgias. Negative for back pain and myalgias.  Skin:  Negative for pallor and rash.  Allergic/Immunologic: Negative for environmental allergies.  Neurological:  Negative for dizziness, syncope and headaches.  Hematological:  Negative for adenopathy. Does not bruise/bleed easily.  Psychiatric/Behavioral:  Negative for decreased concentration and dysphoric mood. The patient is not nervous/anxious.        Objective:   Physical Exam Constitutional:      General: She is not in acute distress.    Appearance: Normal appearance. She is well-developed and normal weight. She is not ill-appearing or diaphoretic.  HENT:     Head: Normocephalic  and atraumatic.  Eyes:     Conjunctiva/sclera: Conjunctivae normal.     Pupils: Pupils are equal, round, and reactive to light.  Neck:     Thyroid: No thyromegaly.     Vascular: No carotid bruit or JVD.  Cardiovascular:     Rate and Rhythm: Normal rate and regular rhythm.     Heart sounds: Normal heart sounds.     No gallop.  Pulmonary:     Effort: Pulmonary effort is normal. No respiratory distress.     Breath sounds: Normal breath sounds. No wheezing or rales.  Abdominal:     General: There is no distension or abdominal bruit.     Palpations: Abdomen is soft.  Musculoskeletal:     Cervical back: Normal range of motion and neck supple.     Right lower leg: No edema.     Left lower leg: No  edema.     Comments: Mild kyphosis    Lymphadenopathy:     Cervical: No cervical adenopathy.  Skin:    General: Skin is warm and dry.     Coloration: Skin is not pale.     Findings: No rash.     Comments: Fatty mobile lump just under antecubital area of R arm Is not tender  Consistent with lipoma   Neurological:     Mental Status: She is alert.     Cranial Nerves: No cranial nerve deficit.     Coordination: Coordination normal.     Deep Tendon Reflexes: Reflexes are normal and symmetric. Reflexes normal.  Psychiatric:        Mood and Affect: Mood normal.           Assessment & Plan:   Problem List Items Addressed This Visit       Cardiovascular and Mediastinum   Essential hypertension - Primary    bp in fair control at this time  BP Readings from Last 1 Encounters:  02/12/22 132/80  No changes needed Pt occ feels light headed-asked her to check bp when this occurs and stay well hydrated and out of the heat  Most recent labs reviewed  Disc lifstyle change with low sodium diet and exercise  Labs ordered  Plan to continue Diltiazem 120 mg daily  hctz 25 mg daily        Relevant Orders   Lipid panel   Comprehensive metabolic panel   CBC with Differential/Platelet     Endocrine   Thyroid nodule    Reviewed recent endocrinology notes  Watching with [place]  No symptoms  Last thyroid numbers were normal        Musculoskeletal and Integument   Osteoporosis    dexa utd 2022 No falls or fx Intolerant to oral bisphosphonates No falls or fractures  Taking vitamin D As active as she can be         Other   HYPERCHOLESTEROLEMIA    Lab today Disc goals for lipids and reasons to control them Rev last labs with pt Rev low sat fat diet in detail Intolerant of statins Avoids beef but does eat butter frequently        Relevant Orders   Lipid panel   Prediabetes    Trying to limit sugar intake a1c ordered  disc imp of low glycemic diet and wt loss to  prevent DM2       Relevant Orders   Hemoglobin A1c   Skin lump of arm, right    Right antecubital   Does  not bother her  Suspect lipoma based on exm She declines [place] unless it becomes bothersome

## 2022-02-12 NOTE — Assessment & Plan Note (Signed)
Trying to limit sugar intake a1c ordered  disc imp of low glycemic diet and wt loss to prevent DM2

## 2022-02-12 NOTE — Assessment & Plan Note (Signed)
dexa utd 2022 No falls or fx Intolerant to oral bisphosphonates No falls or fractures  Taking vitamin D As active as she can be

## 2022-02-12 NOTE — Assessment & Plan Note (Addendum)
Right antecubital   Does not bother her  Suspect lipoma based on exm She declines [place] unless it becomes bothersome

## 2022-02-12 NOTE — Assessment & Plan Note (Addendum)
bp in fair control at this time  BP Readings from Last 1 Encounters:  02/12/22 132/80   No changes needed Pt occ feels light headed-asked her to check bp when this occurs and stay well hydrated and out of the heat  Most recent labs reviewed  Disc lifstyle change with low sodium diet and exercise  Labs ordered  Plan to continue Diltiazem 120 mg daily  hctz 25 mg daily

## 2022-02-13 ENCOUNTER — Encounter: Payer: Self-pay | Admitting: *Deleted

## 2022-02-25 ENCOUNTER — Ambulatory Visit
Admission: RE | Admit: 2022-02-25 | Discharge: 2022-02-25 | Disposition: A | Discharge status: Home / Self Care | Payer: Medicare HMO | Source: Ambulatory Visit | Attending: Internal Medicine | Admitting: Internal Medicine

## 2022-02-25 DIAGNOSIS — E041 Nontoxic single thyroid nodule: Secondary | ICD-10-CM | POA: Diagnosis not present

## 2022-02-26 ENCOUNTER — Encounter: Payer: Self-pay | Admitting: Internal Medicine

## 2022-03-11 DIAGNOSIS — Z1231 Encounter for screening mammogram for malignant neoplasm of breast: Secondary | ICD-10-CM | POA: Diagnosis not present

## 2022-03-11 LAB — HM MAMMOGRAPHY

## 2022-03-15 ENCOUNTER — Encounter: Payer: Self-pay | Admitting: Family Medicine

## 2022-04-14 NOTE — Progress Notes (Unsigned)
SURVIVORSHIP VISIT:    BRIEF ONCOLOGIC HISTORY:  Oncology History  Carcinoma of upper-outer quadrant of right breast in female, estrogen receptor positive ([place])  02/23/2019 Initial Diagnosis   Routine screening mammogram detected a 1.2cm mass in the right breast. Biopsy showed invasive mammary carcinoma with mammary carcinoma in situ, HER-2 - (1+), ER+ 100%, PR+ 5% Ki67 2%.    02/23/2019 Cancer Staging   Staging form: Breast, AJCC 8th Edition - Clinical stage from 02/23/2019: cT1c, cN0, cM0, GX, ER+, PR+, HER2-    04/07/2019 Cancer Staging   Staging form: Breast, AJCC 8th Edition - Pathologic stage from 04/07/2019: Stage IA (pT1c, pN0, cM0, G2, ER+, PR+, HER2-)    04/07/2019 Surgery   Right lumpectomy Marlou Starks) [phone]): IDC, grade 2, 1.3cm, with intermediate grade DCIS, clear margins. No regional lymph nodes were examined.   05/10/2019 - 05/28/2019 Radiation Therapy   The patient initially received a dose of 40.05 Gy in 15 fractions to the right breast and axilla using whole-breast tangent fields. This was delivered using a 3-D conformal technique. The pt did not receive a boost. The total dose was 40.05 Gy.   04/2019 - 04/2024 Anti-estrogen oral therapy   Anastrozole     INTERVAL HISTORY:  Sara [name] to review her survivorship care plan detailing her treatment course for breast cancer, as well as monitoring long-term side effects of that treatment, education regarding health maintenance, screening, and overall wellness and health promotion.     Overall, Sara [name] reports feeling quite well.  She underwent mammogram on March 12, 2022 that showed no evidence of malignancy and breast density category B.  She underwent bone density testing in 02/2021 that demonstrated osteoporosis with a t score of -4.0.  She could not tolerate oral bisphosphanates.    She has not been taking anastrozole for one year since her last appointment with Dr. Lindi Adie.  She notes she stopped the  treatment because her hands were hurting.  She tells me that the pain in the joints in her fingers has persisted.    REVIEW OF SYSTEMS:  Review of Systems  Constitutional:  Negative for appetite change, chills, fatigue, fever and unexpected weight change.  HENT:   Negative for hearing loss, lump/mass and trouble swallowing.   Eyes:  Negative for eye problems and icterus.  Respiratory:  Negative for chest tightness, cough and shortness of breath.   Cardiovascular:  Negative for chest pain, leg swelling and palpitations.  Gastrointestinal:  Negative for abdominal distention, abdominal pain, constipation, diarrhea, nausea and vomiting.  Endocrine: Negative for hot flashes.  Genitourinary:  Negative for difficulty urinating.   Musculoskeletal:  Positive for arthralgias.  Skin:  Negative for itching and rash.  Neurological:  Negative for dizziness, extremity weakness, headaches and numbness.  Hematological:  Negative for adenopathy. Does not bruise/bleed easily.  Psychiatric/Behavioral:  Negative for depression. The patient is not nervous/anxious.   Breast: Denies any new nodularity, masses, tenderness, nipple changes, or nipple discharge.      ONCOLOGY TREATMENT TEAM:  1. Surgeon:  Dr. Marlou Starks at [facility] 2. Medical Oncologist: Dr. Lindi Adie  3. Radiation Oncologist: Dr. Isidore Moos    PAST MEDICAL/SURGICAL HISTORY:  Past Medical History:  Diagnosis Date   Allergy    CAD (coronary artery disease)    Chronic diastolic (congestive) heart failure ([place])    Clinical trial participant    [facility] Genetic studies in familial dementia   Dyspnea 11/28/2016   Family history of breast cancer    Family  history of lung cancer    Family history of prostate cancer    Frequent UTI    GERD (gastroesophageal reflux disease)    Hypertension    Osteoarthritis    hips, back   Osteopenia    Pneumonia    Spinal stenosis    Stroke ([place]) 1985   Past Surgical History:  Procedure  Laterality Date   ABDOMINAL HYSTERECTOMY     partial has one ovary   APPENDECTOMY     BREAST LUMPECTOMY WITH RADIOACTIVE SEED LOCALIZATION Right 04/07/2019   Procedure: RIGHT BREAST LUMPECTOMY WITH RADIOACTIVE SEED LOCALIZATION;  Surgeon: Jovita Kussmaul, MD;  Location: [facility];  Service: General;  Laterality: Right;   CATARACT EXTRACTION Bilateral    LEFT HEART CATH AND CORONARY ANGIOGRAPHY N/A 11/29/2016   Procedure: Left Heart Cath and Coronary Angiography;  Surgeon: Sherren Mocha, MD;  Location: [facility];  Service: Cardiovascular;  Laterality: N/A;   LUMBAR DISC SURGERY     x 3   rotator cuff surgery Right 2010     ALLERGIES:  Allergies  Allergen Reactions   Amoxicillin     Reaction not known   Atorvastatin     aching   Cefpodoxime Proxetil     Reaction not known   Clarithromycin     Reaction not known   Fosamax [Alendronate Sodium]     Body pain    Furosemide     REACTION: doesn't help   Gabapentin Swelling    Caused severe swellling     CURRENT MEDICATIONS:  Outpatient Encounter Medications as of 04/15/2022  Medication Sig   acetaminophen (TYLENOL) 500 MG tablet Take 1,000 mg by mouth every 6 (six) hours as needed.   aspirin EC 81 MG tablet Take 81 mg by mouth daily.     Cholecalciferol (VITAMIN D-3) 1000 UNITS CAPS Take 1,000 Units by mouth daily.    diltiazem (CARDIZEM CD) 120 MG 24 hr capsule TAKE 1 CAPSULE EVERY DAY   Docusate Sodium (EQ STOOL SOFTENER PO) Take 100 mg by mouth daily as needed (constipation).   doxylamine, Sleep, (UNISOM) 25 MG tablet Take 25 mg by mouth at bedtime as needed for sleep.    hydrochlorothiazide (HYDRODIURIL) 25 MG tablet TAKE 1 TABLET EVERY DAY   nitroGLYCERIN (NITROSTAT) 0.4 MG SL tablet Place 1 tablet (0.4 mg total) under the tongue every 5 (five) minutes as needed for chest pain (max 3 doses in 15 minutes).   polyethylene glycol (MIRALAX / GLYCOLAX) packet Take 17 g by mouth daily.    potassium chloride (KLOR-CON) 10 MEQ  tablet TAKE 1 TABLET EVERY DAY   vitamin B-12 (CYANOCOBALAMIN) 1000 MCG tablet Take 1,000 mcg by mouth daily.   No facility-administered encounter medications on file as of 04/15/2022.     ONCOLOGIC FAMILY HISTORY:  Family History  Problem Relation Age of Onset   Lung cancer Brother 76   Other Mother        kidney tumor   Hypertension Mother    Diabetes Sister    Diabetes Brother    Diabetes Sister        x 2   Colon cancer Maternal Uncle    Other Sister        intestine burst   Breast cancer Maternal Aunt        x 2   Breast cancer Daughter        unknown cancer   Diabetes Son        x 3  Diabetes Daughter    Breast cancer Sister        x2, diagnosed in 51s and 32s   Prostate cancer Maternal Uncle    Esophageal cancer Neg Hx    Rectal cancer Neg Hx    Stomach cancer Neg Hx      SOCIAL HISTORY:  Social History   Socioeconomic History   Marital status: Married    Spouse name: Not on file   Number of children: 6   Years of education: Not on file   Highest education level: Not on file  Occupational History   Occupation: RETIRED    Employer: RETIRED  Tobacco Use   Smoking status: Never   Smokeless tobacco: Never  Vaping Use   Vaping Use: Never used  Substance and Sexual Activity   Alcohol use: No    Alcohol/week: 0.0 standard drinks of alcohol   Drug use: No   Sexual activity: Not on file  Other Topics Concern   Not on file  Social History Narrative   Not on file   Social Determinants of Health   Financial Resource Strain: Low Risk  (01/17/2022)   Overall Financial Resource Strain (CARDIA)    Difficulty of Paying Living Expenses: Not hard at all  Food Insecurity: No Food Insecurity (01/17/2022)   Hunger Vital Sign    Worried About Running Out of Food in the Last Year: Never true    Ran Out of Food in the Last Year: Never true  Transportation Needs: No Transportation Needs (01/17/2022)   PRAPARE - Hydrologist (Medical):  No    Lack of Transportation (Non-Medical): No  Physical Activity: Insufficiently Active (01/17/2022)   Exercise Vital Sign    Days of Exercise per Week: 3 days    Minutes of Exercise per Session: 30 min  Stress: No Stress Concern Present (01/17/2022)   [place]    Feeling of Stress : Not at all  Social Connections: Moderately Isolated (01/17/2022)   Social Connection and Isolation Panel [NHANES]    Frequency of Communication with Friends and Family: More than three times a week    Frequency of Social Gatherings with Friends and Family: Three times a week    Attends Religious Services: More than 4 times per year    Active Member of Clubs or Organizations: No    Attends Archivist Meetings: Never    Marital Status: Widowed  Intimate Partner Violence: Not At Risk (01/17/2022)   Humiliation, Afraid, Rape, and Kick questionnaire    Fear of Current or Ex-Partner: No    Emotionally Abused: No    Physically Abused: No    Sexually Abused: No     OBSERVATIONS/OBJECTIVE:  BP 137/69 (BP Location: Right Arm, Patient Position: Sitting)   Pulse 65   Temp 97.7 F (36.5 C) (Tympanic)   Resp 18   Ht _0  (1.549 m)   Wt 147 lb 12.8 oz (67 kg)   SpO2 99%   BMI 27.93 kg/m  GENERAL: Patient is a well appearing female in no acute distress HEENT:  Sclerae anicteric.  Oropharynx clear and moist. No ulcerations or evidence of oropharyngeal candidiasis. Neck is supple.  NODES:  No cervical, supraclavicular, or axillary lymphadenopathy palpated.  BREAST EXAM:  right breast s/p lumpectomy and radiation, slight area of fluctuance about 2 cm in size, well circumscribed in right upper breast consistent with a seroma, no sign of local recurrence, left breast  benign LUNGS:  Clear to auscultation bilaterally.  No wheezes or rhonchi. HEART:  Regular rate and rhythm. No murmur appreciated. ABDOMEN:  Soft, nontender.  Positive,  normoactive bowel sounds. No organomegaly palpated. MSK:  No focal spinal tenderness to palpation. Full range of motion bilaterally in the upper extremities. EXTREMITIES:  No peripheral edema.   SKIN:  Clear with no obvious rashes or skin changes. No nail dyscrasia. NEURO:  Nonfocal. Well oriented.  Appropriate affect.  LABORATORY DATA:  None for this visit.  DIAGNOSTIC IMAGING:  None for this visit.    ASSESSMENT AND PLAN:  Sara [name] is a pleasant 88 y.o. female with Stage IA right breast invasive ductal carcinoma, ER+/PR+/HER2-, diagnosed in 02/2019, treated with lumpectomy, adjuvant radiation therapy, and anti-estrogen therapy with Anastrozole beginning in 04/2019.  She presents to the Survivorship Clinic for our initial meeting and routine follow-up post-completion of treatment for breast cancer.    1. Stage IA right breast cancer:  Kevin has no clinical or radiographic sign of breast cancer recurrence.  She continues on observation alone.  She is due for repeat mammogram in 03/2023.    2. Arthralgias: This is more consistent with arthritis since the anastrozole has been discontinued x 1 year and the pain remains.  She is working through ROM and activity which helps keep this at [place] and has modified her activities such as holding a spoon to accommodate for these aches/pains.    3. Bone health:  She is following with Dr. Glori Bickers about her bone health. She was given education on activities to promote bone health.  4. Cancer screening, health maitnenance and wellness promotion:  Due to Sara [name]'s history and her age, she should receive screening for skin cancers.  She was encouraged to consume 5-7 servings of fruits and vegetables per day.  She was also encouraged to continue with her current activity level.  She was instructed to limit her alcohol consumption and continue to abstain from tobacco use.     Follow up instructions:    -Return to cancer center in one year for  long term survivorship  -Mammogram due in 03/2023  The patient was provided an opportunity to ask questions and all were answered. The patient agreed with the plan and demonstrated an understanding of the instructions.   Total encounter time:30 minutes*in face-to-face visit time, chart review, lab review, care coordination, order entry, and documentation of the encounter time.    Sara Bihari, NP 04/15/22 3:38 PM Medical Oncology and Hematology [facility] [place], [place] [address] Tel. [phone]    Fax. [phone]  *Total Encounter Time as defined by the Centers for Medicare and Medicaid Services includes, in addition to the face-to-face time of a patient visit (documented in the note above) non-face-to-face time: obtaining and reviewing outside history, ordering and reviewing medications, tests or procedures, care coordination (communications with other health care professionals or caregivers) and documentation in the medical record.

## 2022-04-15 ENCOUNTER — Other Ambulatory Visit: Payer: Self-pay

## 2022-04-15 ENCOUNTER — Inpatient Hospital Stay: Payer: Medicare HMO | Attending: Adult Health | Admitting: Adult Health

## 2022-04-15 ENCOUNTER — Encounter: Payer: Self-pay | Admitting: Adult Health

## 2022-04-15 VITALS — BP 137/69 | HR 65 | Temp 97.7°F | Resp 18 | Ht 61.0 in | Wt 147.8 lb

## 2022-04-15 DIAGNOSIS — Z79811 Long term (current) use of aromatase inhibitors: Secondary | ICD-10-CM | POA: Insufficient documentation

## 2022-04-15 DIAGNOSIS — Z803 Family history of malignant neoplasm of breast: Secondary | ICD-10-CM | POA: Diagnosis not present

## 2022-04-15 DIAGNOSIS — Z79899 Other long term (current) drug therapy: Secondary | ICD-10-CM | POA: Insufficient documentation

## 2022-04-15 DIAGNOSIS — Z800 Family history of malignant neoplasm of digestive organs: Secondary | ICD-10-CM | POA: Diagnosis not present

## 2022-04-15 DIAGNOSIS — K219 Gastro-esophageal reflux disease without esophagitis: Secondary | ICD-10-CM | POA: Diagnosis not present

## 2022-04-15 DIAGNOSIS — Z923 Personal history of irradiation: Secondary | ICD-10-CM | POA: Insufficient documentation

## 2022-04-15 DIAGNOSIS — Z7982 Long term (current) use of aspirin: Secondary | ICD-10-CM | POA: Diagnosis not present

## 2022-04-15 DIAGNOSIS — M8580 Other specified disorders of bone density and structure, unspecified site: Secondary | ICD-10-CM | POA: Insufficient documentation

## 2022-04-15 DIAGNOSIS — Z8673 Personal history of transient ischemic attack (TIA), and cerebral infarction without residual deficits: Secondary | ICD-10-CM | POA: Diagnosis not present

## 2022-04-15 DIAGNOSIS — I5032 Chronic diastolic (congestive) heart failure: Secondary | ICD-10-CM | POA: Diagnosis not present

## 2022-04-15 DIAGNOSIS — I110 Hypertensive heart disease with heart failure: Secondary | ICD-10-CM | POA: Diagnosis not present

## 2022-04-15 DIAGNOSIS — Z170 Estrogen receptor positive status [ER+]: Secondary | ICD-10-CM | POA: Diagnosis not present

## 2022-04-15 DIAGNOSIS — Z801 Family history of malignant neoplasm of trachea, bronchus and lung: Secondary | ICD-10-CM | POA: Diagnosis not present

## 2022-04-15 DIAGNOSIS — M1990 Unspecified osteoarthritis, unspecified site: Secondary | ICD-10-CM | POA: Diagnosis not present

## 2022-04-15 DIAGNOSIS — M79646 Pain in unspecified finger(s): Secondary | ICD-10-CM | POA: Insufficient documentation

## 2022-04-15 DIAGNOSIS — I2510 Atherosclerotic heart disease of native coronary artery without angina pectoris: Secondary | ICD-10-CM | POA: Diagnosis not present

## 2022-04-15 DIAGNOSIS — C50411 Malignant neoplasm of upper-outer quadrant of right female breast: Secondary | ICD-10-CM | POA: Insufficient documentation

## 2022-05-23 ENCOUNTER — Ambulatory Visit (INDEPENDENT_AMBULATORY_CARE_PROVIDER_SITE_OTHER): Payer: Medicare HMO | Admitting: Family Medicine

## 2022-05-23 ENCOUNTER — Encounter: Payer: Self-pay | Admitting: Family Medicine

## 2022-05-23 VITALS — BP 134/66 | HR 59 | Temp 97.3°F | Ht 60.0 in | Wt 148.5 lb

## 2022-05-23 DIAGNOSIS — H6122 Impacted cerumen, left ear: Secondary | ICD-10-CM

## 2022-05-23 DIAGNOSIS — M542 Cervicalgia: Secondary | ICD-10-CM

## 2022-05-23 NOTE — Patient Instructions (Addendum)
Try some warm compresses on your neck You can also try voltaren gel 1% over the counter up to four times daily on the painful area   If not improving we can consider some physical therapy and follow up with orthopedics   Use some ear wax solution (like debrox) over the counter in the left ear daily  For the next 1-2 weeks If if you don't get more wax out we can try to irrigate the ear again  The right ear looks ok

## 2022-05-23 NOTE — Assessment & Plan Note (Addendum)
Ongoing with DDD and DJD  Rev last imaging Reassuring exam   Inst to try gentle heat for 10 min  Supportive pillow Voltaren gel 1% otc up to four times daily  Given rehab handout with stretches to try Update if not starting to improve in a week or if worsening

## 2022-05-23 NOTE — Assessment & Plan Note (Addendum)
Bilateral but worse on the left   This affects hearing and makes her feel dizzy  She uses hearing aides   Able to irrigate some from L but not all Inst to use debrox daily and f/u in 1-2 wk if not resolved

## 2022-05-23 NOTE — Progress Notes (Signed)
Subjective:    Patient ID: Sara [name], female    DOB: 09-26-1931, 88 y.o.   MRN: [id number]  HPI Pt presents for ear and neck problems    Wt Readings from Last 3 Encounters:  05/23/22 148 lb 8 oz (67.4 kg)  04/15/22 147 lb 12.8 oz (67 kg)  02/12/22 147 lb 2 oz (66.7 kg)   29.00 kg/m   Ears are stopped up  Head feels woozy /off balance and this is how it makes her feel  Uses hearing aides   Had some teeth pulled  Lost 2 teeth - radiation from her breast cancer caused problems   Had thyroid nodule bx in 2021- benign  Saw endocrinology for f/u in August and planned thyroid US for f/u  02/2022 thyroid US noted multiple nodules/ all stable   Lab Results  Component Value Date   TSH 1.38 02/04/2022     Neck pain  H/o deg disc dz in her neck  Both sides Feels tight  Has not used heat   Neck film 01/2020 Narrative & Impression  CLINICAL DATA:  Neck and arm pain   EXAM: CERVICAL SPINE - COMPLETE 4+ VIEW   COMPARISON:  12/09/2006   FINDINGS: Seven cervical segments are well visualized. Vertebral body is well maintained. Multilevel osteophytic changes are noted anteriorly increased in the interval from the prior exam. No anterolisthesis is noted. Multilevel neural foraminal narrowing is seen. Multilevel facet hypertrophic changes are noted. No soft tissue abnormality is seen. The odontoid is within normal limits.   IMPRESSION: Multilevel degenerative change progressed in the interval from the prior exam. No acute abnormality is noted.       BP Readings from Last 3 Encounters:  05/23/22 134/66  04/15/22 137/69  02/12/22 132/80   Pulse Readings from Last 3 Encounters:  05/23/22 (!) 59  04/15/22 65  02/12/22 (!) 59    Patient Active Problem List   Diagnosis Date Noted   Skin lump of arm, right 02/12/2022   Cerumen impaction 09/12/2021   Aortic root enlargement ([place]) 01/09/2021   Neck pain 01/18/2020   Left shoulder pain 01/18/2020   Pain  in both upper arms 01/18/2020   Thyroid nodule 01/04/2020   Flank pain 11/22/2019   Hand pain, right 10/18/2019   Family history of breast cancer    Family history of prostate cancer    Family history of lung cancer    Carcinoma of upper-outer quadrant of right breast in female, estrogen receptor positive ([place]) 02/23/2019   Pre-operative clearance 02/21/2019   History of breast cancer 02/21/2019   Medicare annual wellness visit, subsequent 01/05/2019   Encounter for screening mammogram for breast cancer 01/05/2019   Knee pain, left 10/14/2018   Arthritis of left hip 07/29/2018   Bradycardia 07/01/2018   Left ear pain 07/01/2018   Chronic diastolic heart failure ([place]) 12/27/2017   Lumbar spinal stenosis 67/05/4579   Diastolic dysfunction 99/83/3825   Estrogen deficiency 12/16/2016   Routine general medical examination at a health care facility 12/16/2016   CAD (coronary artery disease) 12/04/2016   Mucocele, 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   Eczema 03/06/2015   Prediabetes 02/11/2014   CONSTIPATION, CHRONIC 07/03/2010   Osteoporosis 06/24/2008   HYPERCHOLESTEROLEMIA 12/08/2006   Essential hypertension 12/08/2006   GERD 12/08/2006   H/O herpes zoster 12/08/2006   Past Medical History:  Diagnosis Date   Allergy    CAD (coronary artery disease)    Chronic diastolic (congestive) heart failure (HCC)    Clinical trial  participant    [facility] studies in familial dementia   Dyspnea 11/28/2016   Family history of breast cancer    Family history of lung cancer    Family history of prostate cancer    Frequent UTI    GERD (gastroesophageal reflux disease)    Hypertension    Osteoarthritis    hips, back   Osteopenia    Pneumonia    Spinal stenosis    Stroke ([place]) 1985   Past Surgical History:  Procedure Laterality Date   ABDOMINAL HYSTERECTOMY     partial has one ovary   APPENDECTOMY     BREAST LUMPECTOMY WITH RADIOACTIVE SEED LOCALIZATION Right 04/07/2019   Procedure:  RIGHT BREAST LUMPECTOMY WITH RADIOACTIVE SEED LOCALIZATION;  Surgeon: Jovita Kussmaul, MD;  Location: [facility];  Service: General;  Laterality: Right;   CATARACT EXTRACTION Bilateral    LEFT HEART CATH AND CORONARY ANGIOGRAPHY N/A 11/29/2016   Procedure: Left Heart Cath and Coronary Angiography;  Surgeon: Sherren Mocha, MD;  Location: [facility];  Service: Cardiovascular;  Laterality: N/A;   LUMBAR DISC SURGERY     x 3   rotator cuff surgery Right 2010   Social History   Tobacco Use   Smoking status: Never   Smokeless tobacco: Never  Vaping Use   Vaping Use: Never used  Substance Use Topics   Alcohol use: No    Alcohol/week: 0.0 standard drinks of alcohol   Drug use: No   Family History  Problem Relation Age of Onset   Lung cancer Brother 22   Other Mother        kidney tumor   Hypertension Mother    Diabetes Sister    Diabetes Brother    Diabetes Sister        x 2   Colon cancer Maternal Uncle    Other Sister        intestine burst   Breast cancer Maternal Aunt        x 2   Breast cancer Daughter        unknown cancer   Diabetes Son        x 3   Diabetes Daughter    Breast cancer Sister        x2, diagnosed in 51s and 18s   Prostate cancer Maternal Uncle    Esophageal cancer Neg Hx    Rectal cancer Neg Hx    Stomach cancer Neg Hx    Allergies  Allergen Reactions   Amoxicillin     Reaction not known   Atorvastatin     aching   Cefpodoxime Proxetil     Reaction not known   Clarithromycin     Reaction not known   Fosamax [Alendronate Sodium]     Body pain    Furosemide     REACTION: doesn't help   Gabapentin Swelling    Caused severe swellling   Current Outpatient Medications on File Prior to Visit  Medication Sig Dispense Refill   acetaminophen (TYLENOL) 500 MG tablet Take 1,000 mg by mouth every 6 (six) hours as needed.     aspirin EC 81 MG tablet Take 81 mg by mouth daily.       Cholecalciferol (VITAMIN D-3) 1000 UNITS CAPS Take 1,000 Units by  mouth daily.      diltiazem (CARDIZEM CD) 120 MG 24 hr capsule TAKE 1 CAPSULE EVERY DAY 90 capsule 3   Docusate Sodium (EQ STOOL SOFTENER PO) Take 100  mg by mouth daily as needed (constipation).     doxylamine, Sleep, (UNISOM) 25 MG tablet Take 25 mg by mouth at bedtime as needed for sleep.      hydrochlorothiazide (HYDRODIURIL) 25 MG tablet TAKE 1 TABLET EVERY DAY 90 tablet 3   nitroGLYCERIN (NITROSTAT) 0.4 MG SL tablet Place 1 tablet (0.4 mg total) under the tongue every 5 (five) minutes as needed for chest pain (max 3 doses in 15 minutes). 25 tablet 3   polyethylene glycol (MIRALAX / GLYCOLAX) packet Take 17 g by mouth daily.      potassium chloride (KLOR-CON) 10 MEQ tablet TAKE 1 TABLET EVERY DAY 90 tablet 3   vitamin B-12 (CYANOCOBALAMIN) 1000 MCG tablet Take 1,000 mcg by mouth daily.     No current facility-administered medications on file prior to visit.    Review of Systems     Objective:   Physical Exam Constitutional:      General: She is not in acute distress.    Appearance: Normal appearance. She is normal weight. She is not ill-appearing.  HENT:     Head: Normocephalic and atraumatic.     Right Ear: There is impacted cerumen.     Left Ear: There is impacted cerumen.     Ears:     Comments: Left ear canal-total impaction   Right -mild cerumen / can see TM  Procedure: Cerumen Disimpaction After consent obtained   Warm water was applied and gentle ear lavage performed on   ear.    There were no complications and following the disimpaction the tympanic membrane were visible on the bilateral. Tympanic membranes are intact following the procedure.  Auditory canals are improved but not 100% clear on left.   The patient reported mild relief of symptoms after removal of cerumen. She tolerated the procedure well.       Nose: Nose normal.     Mouth/Throat:     Mouth: Mucous membranes are moist.  Eyes:     General:        Right eye: No discharge.        Left eye: No  discharge.     Conjunctiva/sclera: Conjunctivae normal.     Pupils: Pupils are equal, round, and reactive to light.  Neck:     Vascular: No carotid bruit.     Comments: Some tenderness in L cervical musculature with tightness  Little to no bony tenderness  Pain with full flexion and L rotation   No neuro changes  Cardiovascular:     Heart sounds: Normal heart sounds.  Pulmonary:     Effort: Pulmonary effort is normal. No respiratory distress.     Breath sounds: Normal breath sounds. No wheezing or rales.  Musculoskeletal:     Cervical back: Neck supple. Tenderness present. No rigidity.  Lymphadenopathy:     Cervical: No cervical adenopathy.  Skin:    General: Skin is warm and dry.     Coloration: Skin is not pale.     Findings: No erythema or rash.  Neurological:     Mental Status: She is alert.     Cranial Nerves: No cranial nerve deficit.  Psychiatric:        Mood and Affect: Mood normal.           Assessment & Plan:   Problem List Items Addressed This Visit       Nervous and Auditory   Cerumen impaction - Primary    Bilateral but worse on the left  This affects hearing and makes her feel dizzy  She uses hearing aides   Able to irrigate some from L but not all Inst to use debrox daily and f/u in 1-2 wk if not resolved          Other   Neck pain    Ongoing with DDD and DJD  Rev last imaging Reassuring exam   Inst to try gentle heat for 10 min  Supportive pillow Voltaren gel 1% otc up to four times daily  Given rehab handout with stretches to try Update if not starting to improve in a week or if worsening

## 2022-05-29 DIAGNOSIS — H6120 Impacted cerumen, unspecified ear: Secondary | ICD-10-CM | POA: Diagnosis not present

## 2022-05-29 DIAGNOSIS — H6123 Impacted cerumen, bilateral: Secondary | ICD-10-CM | POA: Diagnosis not present

## 2022-07-16 ENCOUNTER — Ambulatory Visit: Payer: Medicare HMO | Attending: Cardiology | Admitting: Cardiology

## 2022-07-16 VITALS — BP 158/74 | HR 56 | Ht 62.0 in | Wt 144.6 lb

## 2022-07-16 DIAGNOSIS — E119 Type 2 diabetes mellitus without complications: Secondary | ICD-10-CM | POA: Diagnosis not present

## 2022-07-16 DIAGNOSIS — I2510 Atherosclerotic heart disease of native coronary artery without angina pectoris: Secondary | ICD-10-CM | POA: Diagnosis not present

## 2022-07-16 DIAGNOSIS — I7789 Other specified disorders of arteries and arterioles: Secondary | ICD-10-CM

## 2022-07-16 DIAGNOSIS — I110 Hypertensive heart disease with heart failure: Secondary | ICD-10-CM | POA: Diagnosis not present

## 2022-07-16 DIAGNOSIS — I5032 Chronic diastolic (congestive) heart failure: Secondary | ICD-10-CM | POA: Diagnosis not present

## 2022-07-16 NOTE — Progress Notes (Signed)
Cardiology Office Note:    Date:  07/16/2022   ID:  Sara [name], DOB 1932-03-14, MRN [id number]  PCP:  Abner Greenspan, MD   [facility] Providers Cardiologist:  Candee Furbish, MD     Referring MD: Abner Greenspan, MD    History of Present Illness:    Sara [name] is a 89 y.o. female here for follow-up of coronary artery disease, mild with no PCI intervention on prior cardiac catheterization with mild anterolateral wall hypokinesis EDP 18 mmHg, 11/29/2016.  Also has chronic diastolic heart failure hypertension GERD.  Dyspnea on exertion ER visit within the last year.  Her mother lived to age 72.   Ventricular bigeminy was noted on telemetry during the hospital stay creating an effective pulse rate in the 40s.   Lasix was listed as a allergy on her medications.  She is not recalling the reason why she is allergic to Lasix.  She is on hydrochlorothiazide   Her daughter was intolerant to Lipitor.  She also stopped her atorvastatin back on early October 2018.   She could not tolerate PFTs.  She has been feeling fatigue.  No chest pain.  TSH recently normal.  Hemoglobin recently normal.  Past Medical History:  Diagnosis Date   Allergy    CAD (coronary artery disease)    Chronic diastolic (congestive) heart failure (HCC)    Clinical trial participant    [facility] Genetic studies in familial dementia   Dyspnea 11/28/2016   Family history of breast cancer    Family history of lung cancer    Family history of prostate cancer    Frequent UTI    GERD (gastroesophageal reflux disease)    Hypertension    Osteoarthritis    hips, back   Osteopenia    Pneumonia    Spinal stenosis    Stroke ([place]) 1985    Past Surgical History:  Procedure Laterality Date   ABDOMINAL HYSTERECTOMY     partial has one ovary   APPENDECTOMY     BREAST LUMPECTOMY WITH RADIOACTIVE SEED LOCALIZATION Right 04/07/2019   Procedure: RIGHT BREAST LUMPECTOMY WITH RADIOACTIVE SEED  LOCALIZATION;  Surgeon: Jovita Kussmaul, MD;  Location: [facility];  Service: General;  Laterality: Right;   CATARACT EXTRACTION Bilateral    LEFT HEART CATH AND CORONARY ANGIOGRAPHY N/A 11/29/2016   Procedure: Left Heart Cath and Coronary Angiography;  Surgeon: Sherren Mocha, MD;  Location: [facility];  Service: Cardiovascular;  Laterality: N/A;   LUMBAR DISC SURGERY     x 3   rotator cuff surgery Right 2010    Current Medications: Current Meds  Medication Sig   acetaminophen (TYLENOL) 500 MG tablet Take 1,000 mg by mouth every 6 (six) hours as needed.   aspirin EC 81 MG tablet Take 81 mg by mouth daily.     Cholecalciferol (VITAMIN D-3) 1000 UNITS CAPS Take 1,000 Units by mouth daily.    diltiazem (CARDIZEM CD) 120 MG 24 hr capsule TAKE 1 CAPSULE EVERY DAY   Docusate Sodium (EQ STOOL SOFTENER PO) Take 100 mg by mouth daily as needed (constipation).   doxylamine, Sleep, (UNISOM) 25 MG tablet Take 25 mg by mouth at bedtime as needed for sleep.    hydrochlorothiazide (HYDRODIURIL) 25 MG tablet TAKE 1 TABLET EVERY DAY   nitroGLYCERIN (NITROSTAT) 0.4 MG SL tablet Place 1 tablet (0.4 mg total) under the tongue every 5 (five) minutes as needed for chest pain (max 3 doses in 15 minutes).  polyethylene glycol (MIRALAX / GLYCOLAX) packet Take 17 g by mouth daily.    potassium chloride (KLOR-CON) 10 MEQ tablet TAKE 1 TABLET EVERY DAY   vitamin B-12 (CYANOCOBALAMIN) 1000 MCG tablet Take 1,000 mcg by mouth daily.     Allergies:   Amoxicillin, Atorvastatin, Cefpodoxime proxetil, Clarithromycin, Fosamax [alendronate sodium], Furosemide, and Gabapentin   Social History   Socioeconomic History   Marital status: Married    Spouse name: Not on file   Number of children: 6   Years of education: Not on file   Highest education level: Not on file  Occupational History   Occupation: RETIRED    Employer: RETIRED  Tobacco Use   Smoking status: Never   Smokeless tobacco: Never  Vaping Use    Vaping Use: Never used  Substance and Sexual Activity   Alcohol use: No    Alcohol/week: 0.0 standard drinks of alcohol   Drug use: No   Sexual activity: Not on file  Other Topics Concern   Not on file  Social History Narrative   Not on file   Social Determinants of Health   Financial Resource Strain: Low Risk  (01/17/2022)   Overall Financial Resource Strain (CARDIA)    Difficulty of Paying Living Expenses: Not hard at all  Food Insecurity: No Food Insecurity (01/17/2022)   Hunger Vital Sign    Worried About Running Out of Food in the Last Year: Never true    [place] in the Last Year: Never true  Transportation Needs: No Transportation Needs (01/17/2022)   PRAPARE - Hydrologist (Medical): No    Lack of Transportation (Non-Medical): No  Physical Activity: Insufficiently Active (01/17/2022)   Exercise Vital Sign    Days of Exercise per Week: 3 days    Minutes of Exercise per Session: 30 min  Stress: No Stress Concern Present (01/17/2022)   [place]    Feeling of Stress : Not at all  Social Connections: Moderately Isolated (01/17/2022)   Social Connection and Isolation Panel [NHANES]    Frequency of Communication with Friends and Family: More than three times a week    Frequency of Social Gatherings with Friends and Family: Three times a week    Attends Religious Services: More than 4 times per year    Active Member of Clubs or Organizations: No    Attends Archivist Meetings: Never    Marital Status: Widowed     Family History: The patient's family history includes Breast cancer in her daughter, maternal aunt, and sister; Colon cancer in her maternal uncle; Diabetes in her brother, daughter, sister, sister, and son; Hypertension in her mother; Lung cancer (age of onset: 74) in her brother; Other in her mother and sister; Prostate cancer in her maternal uncle. There is  no history of Esophageal cancer, Rectal cancer, or Stomach cancer.  ROS:   Please see the history of present illness.     All other systems reviewed and are negative.  EKGs/Labs/Other Studies Reviewed:    The following studies were reviewed today: LHC on 11/29/16 which showed moderate stenosis of small to medium caliber non dominant RCA (70%) and minimal irregularities of left system, EF 50-55% with mild segmental LV dysfunction of distal anterolateral wall and mildly elevated filling pressures. Medical therapy was recommended.    Diagnostic Dominance: Left    12/10/19:    1. Left ventricular ejection fraction, by estimation, is 60  to 65%. The  left ventricle has normal function. The left ventricle has no regional  wall motion abnormalities. Left ventricular diastolic parameters are  indeterminate.   2. Right ventricular systolic function is normal. The right ventricular  size is normal. There is moderately elevated pulmonary artery systolic  pressure. The estimated right ventricular systolic pressure is 41.2 mmHg.   3. The mitral valve is normal in structure. Mild to moderate mitral valve  regurgitation.   4. Tricuspid valve regurgitation is mild to moderate.   5. The aortic valve is tricuspid. Aortic valve regurgitation is mild.  Mild aortic valve sclerosis is present, with no evidence of aortic valve  stenosis.   6. Pulmonic valve regurgitation is moderate.   7. The inferior vena cava is normal in size with greater than 50%  respiratory variability, suggesting right atrial pressure of 3 mmHg.   8. Aortic dilatation noted. Aneurysm of the ascending aorta, measuring 45  mm. Recommend CTA or MRA for further evaluation    CTA 8/78/67: Mild uncomplicated fusiform aneurysmal dilatation of the ascending thoracic aorta measuring 42 mm in diameter    EKG:   07/16/2022-sinus bradycardia 56 PVC x 2 noted.  Left axis deviation. Prior SB 59 LAFB  Recent Labs: 02/04/2022: TSH  1.38 02/12/2022: ALT 10; BUN 15; Creatinine, Ser 0.84; Hemoglobin 13.0; Platelets 214.0; Potassium 4.0; Sodium 140  Recent Lipid Panel    Component Value Date/Time   CHOL 182 02/12/2022 1110   TRIG 149.0 02/12/2022 1110   HDL 53.90 02/12/2022 1110   CHOLHDL 3 02/12/2022 1110   VLDL 29.8 02/12/2022 1110   LDLCALC 98 02/12/2022 1110   LDLDIRECT 143.1 06/16/2013 1510        Physical Exam:    VS:  BP (!) 158/74   Pulse (!) 56   Ht '5\' 2"'$  (1.575 m)   Wt 144 lb 9.6 oz (65.6 kg)   SpO2 95%   BMI 26.45 kg/m     Wt Readings from Last 3 Encounters:  07/16/22 144 lb 9.6 oz (65.6 kg)  05/23/22 148 lb 8 oz (67.4 kg)  04/15/22 147 lb 12.8 oz (67 kg)     GEN:  Well nourished, well developed in no acute distress HEENT: Normal NECK: No JVD; No carotid bruits LYMPHATICS: No lymphadenopathy CARDIAC: RRR, no murmurs, rubs, gallops RESPIRATORY:  Clear to auscultation without rales, wheezing or rhonchi  ABDOMEN: Soft, non-tender, non-distended MUSCULOSKELETAL:  No edema; No deformity  SKIN: Warm and dry NEUROLOGIC:  Alert and oriented x 3 PSYCHIATRIC:  Normal affect   No changes in physical exam from prior  ASSESSMENT:    1. Coronary artery disease involving native coronary artery of native heart without angina pectoris   2. Aortic root enlargement (HCC)     PLAN:    In order of problems listed above:   CAD (coronary artery disease) Mild nonobstructive disease previously seen on cardiac catheterization.  Continue with goal-directed medical therapy.  She is on aspirin 81 mg continuing to treat diabetes.  No changes made.  Continue to exercise.  Aortic root enlargement (HCC) 42 mm on CT scan. Will monitor.  Overall stable.  Fatigue Likely multifactorial.  Continue to encourage activity and exercise.  Lab work recently has been reassuring.    Medication Adjustments/Labs and Tests Ordered: Current medicines are reviewed at length with the patient today.  Concerns regarding  medicines are outlined above.  Orders Placed This Encounter  Procedures   EKG 12-Lead   No orders of the defined  types were placed in this encounter.    Patient Instructions  Medication Instructions:  The current medical regimen is effective;  continue present plan and medications.  *If you need a refill on your cardiac medications before your next appointment, please call your pharmacy*  Follow-Up: At [facility], you and your health needs are our priority.  As part of our continuing mission to provide you with exceptional heart care, we have created designated Provider Care Teams.  These Care Teams include your primary Cardiologist (physician) and Advanced Practice Providers (APPs -  Physician Assistants and Nurse Practitioners) who all work together to provide you with the care you need, when you need it.  We recommend signing up for the patient portal called "MyChart".  Sign up information is provided on this After Visit Summary.  MyChart is used to connect with patients for Virtual Visits (Telemedicine).  Patients are able to view lab/test results, encounter notes, upcoming appointments, etc.  Non-urgent messages can be sent to your provider as well.   To learn more about what you can do with MyChart, go to [email or web address].    Your next appointment:   1 year(s)  Provider:   Candee Furbish, MD        Signed, Candee Furbish, MD  07/16/2022 4:54 PM    [place]

## 2022-07-16 NOTE — Patient Instructions (Signed)

## 2022-11-18 ENCOUNTER — Other Ambulatory Visit: Payer: Self-pay | Admitting: Family Medicine

## 2023-01-20 ENCOUNTER — Ambulatory Visit (INDEPENDENT_AMBULATORY_CARE_PROVIDER_SITE_OTHER): Payer: Medicare HMO

## 2023-01-20 VITALS — Ht 62.0 in | Wt 144.0 lb

## 2023-01-20 DIAGNOSIS — Z1231 Encounter for screening mammogram for malignant neoplasm of breast: Secondary | ICD-10-CM | POA: Diagnosis not present

## 2023-01-20 DIAGNOSIS — Z0000 Encounter for general adult medical examination without abnormal findings: Secondary | ICD-10-CM | POA: Diagnosis not present

## 2023-01-20 NOTE — Patient Instructions (Addendum)
Sara [name] , Thank you for taking time to come for your Medicare Wellness Visit. I appreciate your ongoing commitment to your health goals. Please review the following plan we discussed and let me know if I can assist you in the future.   Referrals/Orders/Follow-Ups/Clinician Recommendations: Aim for 30 minutes of exercise or brisk walking, 6-8 glasses of water, and 5 servings of fruits and vegetables each day.   You have an order for:  []   2D Mammogram  [x]   3D Mammogram  []   Bone Density     Please call for appointment:  [facility] ([facility]) [address] [place], [place] [address] [phone]  [facility] [address] [place], [place] [address] [phone]  [facility] [address] [place], [place] [address] [phone]  [facility] [place] [facility] [address] [place], [place] [address] [phone]  [facility] [address] [place], [place] [address] [phone]    Make sure to wear two-piece clothing.  No lotions, powders, or deodorants the day of the appointment. Make sure to bring picture ID and insurance card.  Bring list of medications you are currently taking including any supplements.   Schedule your [place] screening mammogram through MyChart!   Log into your MyChart account.  Go to 'Visit' (or 'Appointments' if on mobile App) --> Schedule an Appointment  Under 'Select a Reason for Visit' choose the Mammogram Screening option.  Complete the pre-visit questions and select the time and place that best fits your schedule.    This is a list of the screening recommended for you and due dates:  Health Maintenance  Topic Date Due   DTaP/Tdap/Td vaccine (2 - Tdap) 03/23/2008   Zoster (Shingles) Vaccine (2 of 2) 02/19/2018   COVID-19 Vaccine (6 - 2023-24 season) 02/08/2022   Medicare Annual Wellness Visit  01/18/2023   Flu Shot   01/09/2023   Mammogram  03/12/2023   Pneumonia Vaccine  Completed   DEXA scan (bone density measurement)  Completed   HPV Vaccine  Aged Out    Advanced directives: (Copy Requested) Please bring a copy of your health care power of attorney and living will to the office to be added to your chart at your convenience.  Next Medicare Annual Wellness Visit scheduled for next year: Yes  Preventive Care 11 Years and Older, Female Preventive care refers to lifestyle choices and visits with your health care provider that can promote health and wellness. What does preventive care include? A yearly physical exam. This is also called an annual well check. Dental exams once or twice a year. Routine eye exams. Ask your health care provider how often you should have your eyes checked. Personal lifestyle choices, including: Daily care of your teeth and gums. Regular physical activity. Eating a healthy diet. Avoiding tobacco and drug use. Limiting alcohol use. Practicing safe sex. Taking low-dose aspirin every day. Taking vitamin and mineral supplements as recommended by your health care provider. What happens during an annual well check? The services and screenings done by your health care provider during your annual well check will depend on your age, overall health, lifestyle risk factors, and family history of disease. Counseling  Your health care provider may ask you questions about your: Alcohol use. Tobacco use. Drug use. Emotional well-being. Home and relationship well-being. Sexual activity. Eating habits. History of falls. Memory and ability to understand (cognition). Work and work Astronomer.  Reproductive health. Screening  You may have the following tests or measurements: Height, weight, and BMI. Blood pressure. Lipid and cholesterol levels. These may be checked every 5 years, or more frequently if you are over 19 years old. Skin check. Lung cancer screening. You may have  this screening every year starting at age 74 if you have a 30-pack-year history of smoking and currently smoke or have quit within the past 15 years. Fecal occult blood test (FOBT) of the stool. You may have this test every year starting at age 36. Flexible sigmoidoscopy or colonoscopy. You may have a sigmoidoscopy every 5 years or a colonoscopy every 10 years starting at age 57. Hepatitis C blood test. Hepatitis B blood test. Sexually transmitted disease (STD) testing. Diabetes screening. This is done by checking your blood sugar (glucose) after you have not eaten for a while (fasting). You may have this done every 1-3 years. Bone density scan. This is done to screen for osteoporosis. You may have this done starting at age 52. Mammogram. This may be done every 1-2 years. Talk to your health care provider about how often you should have regular mammograms. Talk with your health care provider about your test results, treatment options, and if necessary, the need for more tests. Vaccines  Your health care provider may recommend certain vaccines, such as: Influenza vaccine. This is recommended every year. Tetanus, diphtheria, and acellular pertussis (Tdap, Td) vaccine. You may need a Td booster every 10 years. Zoster vaccine. You may need this after age 31. Pneumococcal 13-valent conjugate (PCV13) vaccine. One dose is recommended after age 11. Pneumococcal polysaccharide (PPSV23) vaccine. One dose is recommended after age 34. Talk to your health care provider about which screenings and vaccines you need and how often you need them. This information is not intended to replace advice given to you by your health care provider. Make sure you discuss any questions you have with your health care provider. Document Released: 06/23/2015 Document Revised: 02/14/2016 Document Reviewed: 03/28/2015 Elsevier Interactive Patient Education  2017 [employer].  Fall Prevention in the Home Falls can cause  injuries. They can happen to people of all ages. There are many things you can do to make your home safe and to help prevent falls. What can I do on the outside of my home? Regularly fix the edges of walkways and driveways and fix any cracks. Remove anything that might make you trip as you walk through a door, such as a raised step or threshold. Trim any bushes or trees on the path to your home. Use bright outdoor lighting. Clear any walking paths of anything that might make someone trip, such as rocks or tools. Regularly check to see if handrails are loose or broken. Make sure that both sides of any steps have handrails. Any raised decks and porches should have guardrails on the edges. Have any leaves, snow, or ice cleared regularly. Use sand or salt on walking paths during winter. Clean up any spills in your garage right away. This includes oil or grease spills. What can I do in the bathroom? Use night lights. Install grab bars by the toilet and in the tub and shower. Do not use towel bars as grab bars. Use non-skid mats or decals in the tub or shower. If you need to sit down in the shower, use a plastic, non-slip stool. Keep the floor dry. Clean up any water that spills on the floor as soon as it happens. Remove soap buildup in the  tub or shower regularly. Attach bath mats securely with double-sided non-slip rug tape. Do not have throw rugs and other things on the floor that can make you trip. What can I do in the bedroom? Use night lights. Make sure that you have a light by your bed that is easy to reach. Do not use any sheets or blankets that are too big for your bed. They should not hang down onto the floor. Have a firm chair that has side arms. You can use this for support while you get dressed. Do not have throw rugs and other things on the floor that can make you trip. What can I do in the kitchen? Clean up any spills right away. Avoid walking on wet floors. Keep items that you  use a lot in easy-to-reach places. If you need to reach something above you, use a strong step stool that has a grab bar. Keep electrical cords out of the way. Do not use floor polish or wax that makes floors slippery. If you must use wax, use non-skid floor wax. Do not have throw rugs and other things on the floor that can make you trip. What can I do with my stairs? Do not leave any items on the stairs. Make sure that there are handrails on both sides of the stairs and use them. Fix handrails that are broken or loose. Make sure that handrails are as long as the stairways. Check any carpeting to make sure that it is firmly attached to the stairs. Fix any carpet that is loose or worn. Avoid having throw rugs at the top or bottom of the stairs. If you do have throw rugs, attach them to the floor with carpet tape. Make sure that you have a light switch at the top of the stairs and the bottom of the stairs. If you do not have them, ask someone to add them for you. What else can I do to help prevent falls? Wear shoes that: Do not have high heels. Have rubber bottoms. Are comfortable and fit you well. Are closed at the toe. Do not wear sandals. If you use a stepladder: Make sure that it is fully opened. Do not climb a closed stepladder. Make sure that both sides of the stepladder are locked into place. Ask someone to hold it for you, if possible. Clearly mark and make sure that you can see: Any grab bars or handrails. First and last steps. Where the edge of each step is. Use tools that help you move around (mobility aids) if they are needed. These include: Canes. Walkers. Scooters. Crutches. Turn on the lights when you go into a dark area. Replace any light bulbs as soon as they burn out. Set up your furniture so you have a clear path. Avoid moving your furniture around. If any of your floors are uneven, fix them. If there are any pets around you, be aware of where they are. Review your  medicines with your doctor. Some medicines can make you feel dizzy. This can increase your chance of falling. Ask your doctor what other things that you can do to help prevent falls. This information is not intended to replace advice given to you by your health care provider. Make sure you discuss any questions you have with your health care provider. Document Released: 03/23/2009 Document Revised: 11/02/2015 Document Reviewed: 07/01/2014 Elsevier Interactive Patient Education  2017 [employer].

## 2023-01-20 NOTE — Progress Notes (Signed)
Subjective:   Sara [name] is a 89 y.o. female who presents for Medicare Annual (Subsequent) preventive examination.  Visit Complete: Virtual  I connected with  Everlene Other on 01/20/23 by a audio enabled telemedicine application and verified that I am speaking with the correct person using two identifiers.  Patient Location: Home  Provider Location: Home Office  I discussed the limitations of evaluation and management by telemedicine. The patient expressed understanding and agreed to proceed.  Vital Signs: Unable to obtain new vitals due to this being a telehealth visit. HT and WT pt reported.  Review of Systems      Cardiac Risk Factors include: advanced age (>13men, >68 women);hypertension;sedentary lifestyle     Objective:    Today's Vitals   01/20/23 1449 01/20/23 1451  Weight: 144 lb (65.3 kg)   Height: 5\' 2"  (1.575 m)   PainSc:  0-No pain   Body mass index is 26.34 kg/m.     01/20/2023    3:01 PM 04/15/2022    1:32 PM 01/17/2022   11:02 AM 08/14/2021   10:46 AM 12/22/2020   10:24 AM 04/26/2020    4:15 PM 11/16/2019    3:03 PM  Advanced Directives  Does Patient Have a Medical Advance Directive? Yes No No Yes No No No  Type of Estate agent of [place];Living will        Copy of Healthcare Power of Attorney in Chart? No - copy requested        Would patient like information on creating a medical advance directive?  No - Patient declined No - Patient declined  No - Patient declined Yes (MAU/Ambulatory/Procedural Areas - Information given)     Current Medications (verified) Outpatient Encounter Medications as of 01/20/2023  Medication Sig   acetaminophen (TYLENOL) 500 MG tablet Take 1,000 mg by mouth every 6 (six) hours as needed.   aspirin EC 81 MG tablet Take 81 mg by mouth daily.     Cholecalciferol (VITAMIN D-3) 1000 UNITS CAPS Take 1,000 Units by mouth daily.    diltiazem (CARDIZEM CD) 120 MG 24 hr capsule TAKE 1 CAPSULE  EVERY DAY   Docusate Sodium (EQ STOOL SOFTENER PO) Take 100 mg by mouth daily as needed (constipation).   doxylamine, Sleep, (UNISOM) 25 MG tablet Take 25 mg by mouth at bedtime as needed for sleep.    hydrochlorothiazide (HYDRODIURIL) 25 MG tablet TAKE 1 TABLET EVERY DAY   nitroGLYCERIN (NITROSTAT) 0.4 MG SL tablet Place 1 tablet (0.4 mg total) under the tongue every 5 (five) minutes as needed for chest pain (max 3 doses in 15 minutes).   polyethylene glycol (MIRALAX / GLYCOLAX) packet Take 17 g by mouth daily.    potassium chloride (KLOR-CON) 10 MEQ tablet TAKE 1 TABLET EVERY DAY   vitamin B-12 (CYANOCOBALAMIN) 1000 MCG tablet Take 1,000 mcg by mouth daily.   No facility-administered encounter medications on file as of 01/20/2023.    Allergies (verified) Amoxicillin, Atorvastatin, Cefpodoxime proxetil, Clarithromycin, Fosamax [alendronate sodium], Furosemide, and Gabapentin   History: Past Medical History:  Diagnosis Date   Allergy    CAD (coronary artery disease)    Chronic diastolic (congestive) heart failure (HCC)    Clinical trial participant    [facility] Genetic studies in familial dementia   Dyspnea 11/28/2016   Family history of breast cancer    Family history of lung cancer    Family history of prostate cancer    Frequent UTI  GERD (gastroesophageal reflux disease)    Hypertension    Osteoarthritis    hips, back   Osteopenia    Pneumonia    Spinal stenosis    Stroke (HCC) 1985   Past Surgical History:  Procedure Laterality Date   ABDOMINAL HYSTERECTOMY     partial has one ovary   APPENDECTOMY     BREAST LUMPECTOMY WITH RADIOACTIVE SEED LOCALIZATION Right 04/07/2019   Procedure: RIGHT BREAST LUMPECTOMY WITH RADIOACTIVE SEED LOCALIZATION;  Surgeon: Griselda Miner, MD;  Location: [facility] OR;  Service: General;  Laterality: Right;   CATARACT EXTRACTION Bilateral    LEFT HEART CATH AND CORONARY ANGIOGRAPHY N/A 11/29/2016   Procedure: Left Heart Cath and Coronary  Angiography;  Surgeon: Tonny Bollman, MD;  Location: [facility] INVASIVE CV LAB;  Service: Cardiovascular;  Laterality: N/A;   LUMBAR DISC SURGERY     x 3   rotator cuff surgery Right 2010   Family History  Problem Relation Age of Onset   Lung cancer Brother 43   Other Mother        kidney tumor   Hypertension Mother    Diabetes Sister    Diabetes Brother    Diabetes Sister        x 2   Colon cancer Maternal Uncle    Other Sister        intestine burst   Breast cancer Maternal Aunt        x 2   Breast cancer Daughter        unknown cancer   Diabetes Son        x 3   Diabetes Daughter    Breast cancer Sister        x2, diagnosed in 31s and 63s   Prostate cancer Maternal Uncle    Esophageal cancer Neg Hx    Rectal cancer Neg Hx    Stomach cancer Neg Hx    Social History   Socioeconomic History   Marital status: Widowed    Spouse name: Not on file   Number of children: 6   Years of education: Not on file   Highest education level: Not on file  Occupational History   Occupation: RETIRED    Employer: RETIRED  Tobacco Use   Smoking status: Never   Smokeless tobacco: Never  Vaping Use   Vaping status: Never Used  Substance and Sexual Activity   Alcohol use: No    Alcohol/week: 0.0 standard drinks of alcohol   Drug use: No   Sexual activity: Not on file  Other Topics Concern   Not on file  Social History Narrative   Not on file   Social Determinants of Health   Financial Resource Strain: Low Risk  (01/20/2023)   Overall Financial Resource Strain (CARDIA)    Difficulty of Paying Living Expenses: Not hard at all  Food Insecurity: No Food Insecurity (01/20/2023)   Hunger Vital Sign    Worried About Running Out of Food in the Last Year: Never true    Ran Out of Food in the Last Year: Never true  Transportation Needs: No Transportation Needs (01/20/2023)   PRAPARE - Administrator, Civil Service (Medical): No    Lack of Transportation (Non-Medical): No   Physical Activity: Insufficiently Active (01/20/2023)   Exercise Vital Sign    Days of Exercise per Week: 7 days    Minutes of Exercise per Session: 10 min  Stress: No Stress Concern Present (01/20/2023)   [employer] of  Occupational Health - Occupational Stress Questionnaire    Feeling of Stress : Not at all  Social Connections: Moderately Integrated (01/20/2023)   Social Connection and Isolation Panel [NHANES]    Frequency of Communication with Friends and Family: More than three times a week    Frequency of Social Gatherings with Friends and Family: More than three times a week    Attends Religious Services: More than 4 times per year    Active Member of [employer] or Organizations: Yes    Attends Banker Meetings: Never    Marital Status: Widowed  Recent Concern: Social Connections - Moderately Isolated (01/20/2023)   Social Connection and Isolation Panel [NHANES]    Frequency of Communication with Friends and Family: More than three times a week    Frequency of Social Gatherings with Friends and Family: More than three times a week    Attends Religious Services: More than 4 times per year    Active Member of [employer] or Organizations: No    Attends Banker Meetings: Never    Marital Status: Widowed    Tobacco Counseling Counseling given: Not Answered   Clinical Intake:  Pre-visit preparation completed: Yes  Pain : 0-10 Pain Score: 0-No pain     BMI - recorded: 26.34 Nutritional Status: BMI 25 -29 Overweight Nutritional Risks: None Diabetes: No  How often do you need to have someone help you when you read instructions, pamphlets, or other written materials from your doctor or pharmacy?: 1 - Never  Interpreter Needed?: No  Information entered by :: C. LPN   Activities of Daily Living    01/20/2023    3:05 PM  In your present state of health, do you have any difficulty performing the following activities:  Hearing? 0  Vision? 0   Difficulty concentrating or making decisions? 1  Comment Occasionally forgets  Walking or climbing stairs? 0  Dressing or bathing? 0  Doing errands, shopping? 0  Preparing Food and eating ? N  Using the Toilet? N  In the past six months, have you accidently leaked urine? N  Do you have problems with loss of bowel control? N  Managing your Medications? N  Managing your Finances? N  Housekeeping or managing your Housekeeping? N    Patient Care Team: Tower, Audrie Gallus, MD as PCP - General Anne Fu Veverly Fells, MD as PCP - Cardiology (Cardiology) Griselda Miner, MD as Consulting Physician (General Surgery) Lonie Peak, MD as Attending Physician (Radiation Oncology) Serena Croissant, MD as Consulting Physician (Hematology and Oncology) Otho Ket, RN as Case Manager  Indicate any recent Medical Services you may have received from other than [facility] providers in the past year (date may be approximate).     Assessment:   This is a routine wellness examination for Adelie.  Hearing/Vision screen Hearing Screening - Comments:: aids Vision Screening - Comments:: Glasses - Dr.McFarland - Pt will call for appointment.  Dietary issues and exercise activities discussed:     Goals Addressed             This Visit's Progress    Patient Stated       Stay active       Depression Screen    01/20/2023    3:00 PM 01/17/2022   11:00 AM 01/09/2021    9:27 AM 12/22/2020   10:40 AM 01/05/2019    9:33 AM 10/14/2018   11:49 AM 12/18/2017   10:47 AM  PHQ 2/9 Scores  PHQ -  2 Score 0 0 0 0 0 1 0  PHQ- 9 Score  0    5 0    Fall Risk    01/20/2023    3:05 PM 01/17/2022   11:03 AM 07/24/2021    9:59 AM 07/24/2021    9:58 AM 01/09/2021    9:26 AM  Fall Risk   Falls in the past year? 0 1  1 0  Number falls in past yr: 0 0  1   Injury with Fall? 0 0 0    Risk for fall due to : No Fall Risks History of fall(s) History of fall(s);Impaired mobility    Risk for fall due to: Comment   uses cane for  ambulating outside    Follow up Falls prevention discussed;Falls evaluation completed Falls evaluation completed;Falls prevention discussed Falls prevention discussed  Falls evaluation completed    MEDICARE RISK AT HOME:  Medicare Risk at Home - 01/20/23 1507     Any stairs in or around the home? Yes    If so, are there any without handrails? No    Home free of loose throw rugs in walkways, pet beds, electrical cords, etc? Yes    Adequate lighting in your home to reduce risk of falls? Yes    Life alert? No    Use of a cane, walker or w/c? Yes   cane   Grab bars in the bathroom? Yes    Shower chair or bench in shower? Yes    Elevated toilet seat or a handicapped toilet? Yes             TIMED UP AND GO:  Was the test performed?  No    Cognitive Function:    12/18/2017   10:45 AM  MMSE - Mini Mental State Exam  Orientation to time 5  Orientation to Place 5  Registration 3  Attention/ Calculation 0  Recall 1  Recall-comments unable to recall 2 of 3 words  Language- name 2 objects 0  Language- repeat 1  Language- follow 3 step command 3  Language- read & follow direction 0  Write a sentence 0  Copy design 0  Total score 18        01/20/2023    3:08 PM  6CIT Screen  What Year? 0 points  What month? 0 points  What time? 0 points  Count back from 20 0 points  Months in reverse 0 points  Repeat phrase 6 points  Total Score 6 points    Immunizations Immunization History  Administered Date(s) Administered   Fluad Quad(high Dose 65+) 02/19/2019   Influenza Split 05/20/2011   Influenza Whole 03/25/2006, 04/07/2008, 04/11/2009, 03/08/2010, 03/04/2012   Influenza,inj,Quad PF,6+ Mos 03/05/2013, 02/11/2014, 03/06/2015, 03/15/2016, 03/11/2017, 02/19/2020   Influenza-Unspecified 03/08/2018, 03/17/2021   PFIZER Comirnaty(Gray Top)Covid-19 Tri-Sucrose Vaccine 10/14/2020   PFIZER(Purple Top)SARS-COV-2 Vaccination 06/25/2019, 07/16/2019, 03/11/2020, 03/17/2021    Pneumococcal Conjugate-13 12/16/2016   Pneumococcal Polysaccharide-23 02/09/1996, 06/16/2013   Td 03/23/1998   Zoster Recombinant(Shingrix) 12/25/2017    TDAP status: Due, Education has been provided regarding the importance of this vaccine. Advised may receive this vaccine at local pharmacy or Health Dept. Aware to provide a copy of the vaccination record if obtained from local pharmacy or Health Dept. Verbalized acceptance and understanding.  Flu Vaccine status: Due, Education has been provided regarding the importance of this vaccine. Advised may receive this vaccine at local pharmacy or Health Dept. Aware to provide a copy of the vaccination record if obtained from  local pharmacy or Health Dept. Verbalized acceptance and understanding.  Pneumococcal vaccine status: Up to date  Covid-19 vaccine status: Information provided on how to obtain vaccines.   Qualifies for Shingles Vaccine? Yes   Zostavax completed  unknown   Shingrix Completed?: No.    Education has been provided regarding the importance of this vaccine. Patient has been advised to call insurance company to determine out of pocket expense if they have not yet received this vaccine. Advised may also receive vaccine at local pharmacy or Health Dept. Verbalized acceptance and understanding.  Screening Tests Health Maintenance  Topic Date Due   DTaP/Tdap/Td (2 - Tdap) 03/23/2008   Zoster Vaccines- Shingrix (2 of 2) 02/19/2018   COVID-19 Vaccine (6 - 2023-24 season) 02/08/2022   INFLUENZA VACCINE  01/09/2023   MAMMOGRAM  03/12/2023   Medicare Annual Wellness (AWV)  01/20/2024   Pneumonia Vaccine 39+ Years old  Completed   DEXA SCAN  Completed   HPV VACCINES  Aged Out    Health Maintenance  Health Maintenance Due  Topic Date Due   DTaP/Tdap/Td (2 - Tdap) 03/23/2008   Zoster Vaccines- Shingrix (2 of 2) 02/19/2018   COVID-19 Vaccine (6 - 2023-24 season) 02/08/2022   INFLUENZA VACCINE  01/09/2023    Colorectal cancer  screening: No longer required.   Mammogram status: Completed 03/11/22. Repeat every year  Bone scan - Declined  Lung Cancer Screening: (Low Dose CT Chest recommended if Age 19-80 years, 20 pack-year currently smoking OR have quit w/in 15years.) does not qualify.   Lung Cancer Screening Referral: no  Additional Screening:  Hepatitis C Screening: does not qualify; Completed no  Vision Screening: Recommended annual ophthalmology exams for early detection of glaucoma and other disorders of the eye. Is the patient up to date with their annual eye exam?  No , pt stated she will call for appointment. Who is the provider or what is the name of the office in which the patient attends annual eye exams? Dr.McFarland If pt is not established with a provider, would they like to be referred to a provider to establish care? Yes .   Dental Screening: Recommended annual dental exams for proper oral hygiene  Community Resource Referral / Chronic Care Management: CRR required this visit?  No   CCM required this visit?  No     Plan:     I have personally reviewed and noted the following in the patient's chart:   Medical and social history Use of alcohol, tobacco or illicit drugs  Current medications and supplements including opioid prescriptions. Patient is not currently taking opioid prescriptions. Functional ability and status Nutritional status Physical activity Advanced directives List of other physicians Hospitalizations, surgeries, and ER visits in previous 12 months Vitals Screenings to include cognitive, depression, and falls Referrals and appointments  In addition, I have reviewed and discussed with patient certain preventive protocols, quality metrics, and best practice recommendations. A written personalized care plan for preventive services as well as general preventive health recommendations were provided to patient.     Maryan Puls, LPN   1/61/0960   After Visit  Summary: (MyChart) Due to this being a telephonic visit, the after visit summary with patients personalized plan was offered to patient via MyChart   Nurse Notes: none

## 2023-01-31 ENCOUNTER — Other Ambulatory Visit: Payer: Self-pay | Admitting: Family Medicine

## 2023-02-06 ENCOUNTER — Encounter: Payer: Self-pay | Admitting: Internal Medicine

## 2023-02-06 ENCOUNTER — Ambulatory Visit: Payer: Medicare HMO | Admitting: Internal Medicine

## 2023-02-06 VITALS — BP 138/86 | HR 64 | Ht 62.0 in | Wt 146.0 lb

## 2023-02-06 DIAGNOSIS — E041 Nontoxic single thyroid nodule: Secondary | ICD-10-CM | POA: Diagnosis not present

## 2023-02-06 NOTE — Progress Notes (Signed)
Name: Sara [name]  MRN/ DOB: [id number], 1931-07-24    Age/ Sex: 89 y.o., female     PCP: Tower, Audrie Gallus, MD   Reason for Endocrinology Evaluation: Right thyroid nodule     Initial Endocrinology Clinic Visit: 02/01/2020    PATIENT IDENTIFIER: Sara [name] is a 89 y.o., female with a past medical history of HTN, thyroid nodule, history of breast cancer( S/P right lumpectomy, chemo, and radiation in 2020), history of CHF. She has followed with [facility] since 02/01/2020 for consultative assistance with management of her right thyroid nodule.   HISTORICAL SUMMARY:   Patient was noted to have an incidental finding of an asymmetrical thyroid gland on CT Angio in 12/2019 during evaluation of thoracic aneurysm. This prompted a thyroid ultrasound demonstrating 4.3 cm right thyroid nodule meeting criteria for FNA.   She is status post right thyroid nodule 4.3 cm FNA with benign cytology (02/08/2020)  SUBJECTIVE:    Today (02/06/2023):  Sara [name] is here for a follow-up on right thyroid nodule   She denies local neck symptoms  Has noted fatigue but denies fatigue  Continue with occasional tremors  Has occasional constipation but no diarrhea   She follows with oncology for history of right breast cancer She follows with cardiology for CAD and aortic root enlargement   HISTORY:  Past Medical History:  Past Medical History:  Diagnosis Date   Allergy    CAD (coronary artery disease)    Chronic diastolic (congestive) heart failure (HCC)    Clinical trial participant    [facility] Genetic studies in familial dementia   Dyspnea 11/28/2016   Family history of breast cancer    Family history of lung cancer    Family history of prostate cancer    Frequent UTI    GERD (gastroesophageal reflux disease)    Hypertension    Osteoarthritis    hips, back   Osteopenia    Pneumonia    Spinal stenosis    Stroke (HCC) 1985   Past Surgical  History:  Past Surgical History:  Procedure Laterality Date   ABDOMINAL HYSTERECTOMY     partial has one ovary   APPENDECTOMY     BREAST LUMPECTOMY WITH RADIOACTIVE SEED LOCALIZATION Right 04/07/2019   Procedure: RIGHT BREAST LUMPECTOMY WITH RADIOACTIVE SEED LOCALIZATION;  Surgeon: Griselda Miner, MD;  Location: [facility] OR;  Service: General;  Laterality: Right;   CATARACT EXTRACTION Bilateral    LEFT HEART CATH AND CORONARY ANGIOGRAPHY N/A 11/29/2016   Procedure: Left Heart Cath and Coronary Angiography;  Surgeon: Tonny Bollman, MD;  Location: [facility] INVASIVE CV LAB;  Service: Cardiovascular;  Laterality: N/A;   LUMBAR DISC SURGERY     x 3   rotator cuff surgery Right 2010   Social History:  reports that she has never smoked. She has never used smokeless tobacco. She reports that she does not drink alcohol and does not use drugs. Family History:  Family History  Problem Relation Age of Onset   Lung cancer Brother 82   Other Mother        kidney tumor   Hypertension Mother    Diabetes Sister    Diabetes Brother    Diabetes Sister        x 2   Colon cancer Maternal Uncle    Other Sister        intestine burst   Breast cancer Maternal Aunt        x 2  Breast cancer Daughter        unknown cancer   Diabetes Son        x 3   Diabetes Daughter    Breast cancer Sister        x2, diagnosed in 43s and 59s   Prostate cancer Maternal Uncle    Esophageal cancer Neg Hx    Rectal cancer Neg Hx    Stomach cancer Neg Hx      HOME MEDICATIONS: Allergies as of 02/06/2023       Reactions   Amoxicillin    Reaction not known   Atorvastatin    aching   Cefpodoxime Proxetil    Reaction not known   Clarithromycin    Reaction not known   Fosamax [alendronate Sodium]    Body pain    Furosemide    REACTION: doesn't help   Gabapentin Swelling   Caused severe swellling        Medication List        Accurate as of February 06, 2023 10:14 AM. If you have any questions, ask your nurse  or doctor.          acetaminophen 500 MG tablet Commonly known as: TYLENOL Take 1,000 mg by mouth every 6 (six) hours as needed.   aspirin EC 81 MG tablet Take 81 mg by mouth daily.   cyanocobalamin 1000 MCG tablet Commonly known as: VITAMIN B12 Take 1,000 mcg by mouth daily.   diltiazem 120 MG 24 hr capsule Commonly known as: CARDIZEM CD TAKE 1 CAPSULE EVERY DAY   doxylamine (Sleep) 25 MG tablet Commonly known as: UNISOM Take 25 mg by mouth at bedtime as needed for sleep.   EQ STOOL SOFTENER PO Take 100 mg by mouth daily as needed (constipation).   hydrochlorothiazide 25 MG tablet Commonly known as: HYDRODIURIL TAKE 1 TABLET EVERY DAY   nitroGLYCERIN 0.4 MG SL tablet Commonly known as: NITROSTAT Place 1 tablet (0.4 mg total) under the tongue every 5 (five) minutes as needed for chest pain (max 3 doses in 15 minutes).   polyethylene glycol 17 g packet Commonly known as: MIRALAX / GLYCOLAX Take 17 g by mouth daily.   potassium chloride 10 MEQ tablet Commonly known as: KLOR-CON TAKE 1 TABLET EVERY DAY   Vitamin D-3 25 MCG (1000 UT) Caps Take 1,000 Units by mouth daily.          OBJECTIVE:   PHYSICAL EXAM: VS: BP 138/86 (BP Location: Left Arm, Patient Position: Sitting, Cuff Size: Large)   Pulse 64   Ht 5\' 2"  (1.575 m)   Wt 146 lb (66.2 kg)   SpO2 97%   BMI 26.70 kg/m    EXAM: General: Pt appears well and is in NAD  Neck: General: Supple without adenopathy. Thyroid: Thyroid size normal.  No nodules appreciated on today's exam  Lungs: Clear with good BS bilat   Heart: Auscultation: RRR.  Extremities:  BL LE: No pretibial edema   Mental Status: Judgment, insight: Intact Orientation: Oriented to time, place, and person Mood and affect: No depression, anxiety, or agitation     DATA REVIEWED:   Latest Reference Range & Units 02/06/23 10:16  TSH 0.35 - 5.50 uIU/mL 1.32  T4,Free(Direct) 0.60 - 1.60 ng/dL 3.23      Thyroid ultrasound  02/25/2022   Nodule labeled 1 in the right mid thyroid, 3.6 cm, decreased from 3.9 cm. Nodule has undergone prior biopsy. Assuming benign result, no further specific follow-up would be indicated.  Nodule labeled 2, inferior left thyroid, 7 mm. Nodule does not meet criteria for surveillance.   No adenopathy   Recommendations follow those established by the new ACR TI-RADS criteria (J Am Coll Radiol 2017;14:587-595).   IMPRESSION: Similar appearance of heterogeneous/multinodular thyroid as above.    FNA right thyroid nodule 02/08/2020  Clinical History: Location: Right; Mid, Maximum Size: 4.3 cm; Other 2  dimensions: 3.1 x 2.0 cm, solid/almost completely solid (2), Isoechoic  (1), ACR TI-RADS Total Points 3.  Specimen Submitted:  A. THYROID, RIGHT LOBE, FINE NEEDLE ASPIRATION:    FINAL MICROSCOPIC DIAGNOSIS:  - Consistent with benign follicular nodule (Bethesda category II)    ASSESSMENT / PLAN / RECOMMENDATIONS:   Right thyroid nodule:   -Patient is clinically  euthyroid -No local neck symptoms -Status post benign FNA of the right thyroid nodule 01/2020 -Last year ultrasound shows decreased in size, will repeat again this year to confirm a 3-year stability.  If nodule remains stable no need for endocrinology follow-up unless there is a clinical concern.     Follow-up pending thyroid ultrasound results   Signed electronically by: Lyndle Herrlich, MD  [facility] Endocrinology  [facility] [address] [place], [place] [address] Phone: [phone] FAX: [phone]      CC: Tower, Audrie Gallus, MD [address] [place] [address] Phone: [phone]  Fax: [phone]   Return to Endocrinology clinic as below: Future Appointments  Date Time Provider Department Center  02/11/2023 11:00 AM Tower, Audrie Gallus, MD [facility]  04/16/2023 10:15 AM Loa Socks, NP [facility] None  01/26/2024  3:00 PM [facility]  ANNUAL WELLNESS VISIT 1 [facility]

## 2023-02-07 ENCOUNTER — Encounter: Payer: Self-pay | Admitting: Internal Medicine

## 2023-02-07 LAB — T4, FREE: Free T4: 1.07 ng/dL (ref 0.60–1.60)

## 2023-02-07 LAB — TSH: TSH: 1.32 u[IU]/mL (ref 0.35–5.50)

## 2023-02-08 ENCOUNTER — Other Ambulatory Visit: Payer: Self-pay

## 2023-02-08 ENCOUNTER — Emergency Department (HOSPITAL_COMMUNITY)
Admission: EM | Admit: 2023-02-08 | Discharge: 2023-02-08 | Disposition: A | Discharge status: Home / Self Care | Payer: Medicare HMO | Attending: Emergency Medicine | Admitting: Emergency Medicine

## 2023-02-08 ENCOUNTER — Emergency Department (HOSPITAL_COMMUNITY): Payer: Medicare HMO

## 2023-02-08 ENCOUNTER — Encounter (HOSPITAL_COMMUNITY): Payer: Self-pay

## 2023-02-08 ENCOUNTER — Ambulatory Visit
Admission: EM | Admit: 2023-02-08 | Discharge: 2023-02-08 | Disposition: A | Discharge status: Other Acute Inpatient Hospital | Payer: Medicare HMO | Source: Home / Self Care

## 2023-02-08 DIAGNOSIS — I5032 Chronic diastolic (congestive) heart failure: Secondary | ICD-10-CM | POA: Diagnosis not present

## 2023-02-08 DIAGNOSIS — R0600 Dyspnea, unspecified: Secondary | ICD-10-CM | POA: Diagnosis not present

## 2023-02-08 DIAGNOSIS — R42 Dizziness and giddiness: Secondary | ICD-10-CM

## 2023-02-08 DIAGNOSIS — R0602 Shortness of breath: Secondary | ICD-10-CM | POA: Diagnosis not present

## 2023-02-08 DIAGNOSIS — M542 Cervicalgia: Secondary | ICD-10-CM | POA: Insufficient documentation

## 2023-02-08 DIAGNOSIS — Z1152 Encounter for screening for COVID-19: Secondary | ICD-10-CM | POA: Diagnosis not present

## 2023-02-08 DIAGNOSIS — Z7982 Long term (current) use of aspirin: Secondary | ICD-10-CM | POA: Insufficient documentation

## 2023-02-08 LAB — RESP PANEL BY RT-PCR (RSV, FLU A&B, COVID)  RVPGX2
Influenza A by PCR: NEGATIVE
Influenza B by PCR: NEGATIVE
Resp Syncytial Virus by PCR: NEGATIVE
SARS Coronavirus 2 by RT PCR: NEGATIVE

## 2023-02-08 LAB — CBC
HCT: 44.8 % (ref 36.0–46.0)
Hemoglobin: 14.3 g/dL (ref 12.0–15.0)
MCH: 28.5 pg (ref 26.0–34.0)
MCHC: 31.9 g/dL (ref 30.0–36.0)
MCV: 89.2 fL (ref 80.0–100.0)
Platelets: 206 10*3/uL (ref 150–400)
RBC: 5.02 MIL/uL (ref 3.87–5.11)
RDW: 12.6 % (ref 11.5–15.5)
WBC: 7.1 10*3/uL (ref 4.0–10.5)
nRBC: 0 % (ref 0.0–0.2)

## 2023-02-08 LAB — URINALYSIS, ROUTINE W REFLEX MICROSCOPIC
Bacteria, UA: NONE SEEN
Bilirubin Urine: NEGATIVE
Glucose, UA: NEGATIVE mg/dL
Hgb urine dipstick: NEGATIVE
Ketones, ur: NEGATIVE mg/dL
Nitrite: NEGATIVE
Protein, ur: NEGATIVE mg/dL
Specific Gravity, Urine: 1.009 (ref 1.005–1.030)
pH: 7 (ref 5.0–8.0)

## 2023-02-08 LAB — BASIC METABOLIC PANEL
Anion gap: 17 — ABNORMAL HIGH (ref 5–15)
BUN: 12 mg/dL (ref 8–23)
CO2: 23 mmol/L (ref 22–32)
Calcium: 10.1 mg/dL (ref 8.9–10.3)
Chloride: 100 mmol/L (ref 98–111)
Creatinine, Ser: 0.81 mg/dL (ref 0.44–1.00)
GFR, Estimated: >60 mL/min (ref >60)
Glucose, Bld: 111 mg/dL — ABNORMAL HIGH (ref 70–99)
Potassium: 3.9 mmol/L (ref 3.5–5.1)
Sodium: 140 mmol/L (ref 135–145)

## 2023-02-08 LAB — TROPONIN I (HIGH SENSITIVITY)
Troponin I (High Sensitivity): 27 ng/L — ABNORMAL HIGH (ref <18)
Troponin I (High Sensitivity): 29 ng/L — ABNORMAL HIGH (ref <18)

## 2023-02-08 LAB — CBG MONITORING, ED: Glucose-Capillary: 98 mg/dL (ref 70–99)

## 2023-02-08 LAB — BRAIN NATRIURETIC PEPTIDE: B Natriuretic Peptide: 227.9 pg/mL — ABNORMAL HIGH (ref 0.0–100.0)

## 2023-02-08 NOTE — ED Triage Notes (Signed)
Pt reports dizziness and shortness of breath that started over night, states she couldn't sleep much due to feeling dizzy. Pt denies chest pain or headache. Pt also reports right sided neck pain

## 2023-02-08 NOTE — ED Triage Notes (Signed)
Here with daughter.   Note: Hard of hearing. "All night long it has been hard to breath, sob, with right sided neck pain". Some runny nose. No sore throat. No chest pain. Some cough "at times". "Dizziness started this morning, I feel unsteady".

## 2023-02-08 NOTE — ED Provider Notes (Signed)
[place] EMERGENCY DEPARTMENT AT [facility] Provider Note   CSN: [id number] Arrival date & time: 02/08/23  1152     History  Chief Complaint  Patient presents with   Dizziness   Shortness of Breath    Sara [name] is a 89 y.o. female.  This is a 89 year old female here today for dyspnea, lightheadedness, right sided neck pain.  Patient has a past history significant for diastolic heart failure, coronary artery disease.  Patient says that she felt this when she woke up, and felt fatigued as well.  She has not had any chest pain.  She went to an urgent care recommended she come to the emergency department for additional testing.   Dizziness Associated symptoms: shortness of breath   Shortness of Breath      Home Medications Prior to Admission medications   Medication Sig Start Date End Date Taking? Authorizing Provider  acetaminophen (TYLENOL) 500 MG tablet Take 1,000 mg by mouth every 6 (six) hours as needed.    [provider]  aspirin EC 81 MG tablet Take 81 mg by mouth daily.      [provider]  Cholecalciferol (VITAMIN D-3) 1000 UNITS CAPS Take 1,000 Units by mouth daily.     [provider]  diltiazem (CARDIZEM CD) 120 MG 24 hr capsule TAKE 1 CAPSULE EVERY DAY 01/31/23   Tower, Audrie Gallus, MD  Docusate Sodium (EQ STOOL SOFTENER PO) Take 100 mg by mouth daily as needed (constipation).    [provider]  doxylamine, Sleep, (UNISOM) 25 MG tablet Take 25 mg by mouth at bedtime as needed for sleep.     [provider]  hydrochlorothiazide (HYDRODIURIL) 25 MG tablet TAKE 1 TABLET EVERY DAY 01/31/23   Tower, Audrie Gallus, MD  nitroGLYCERIN (NITROSTAT) 0.4 MG SL tablet Place 1 tablet (0.4 mg total) under the tongue every 5 (five) minutes as needed for chest pain (max 3 doses in 15 minutes). 01/02/18   Tower, Audrie Gallus, MD  polyethylene glycol (MIRALAX / GLYCOLAX) packet Take 17 g by mouth daily.     [provider]   potassium chloride (KLOR-CON) 10 MEQ tablet TAKE 1 TABLET EVERY DAY 11/18/22   Tower, Audrie Gallus, MD  vitamin B-12 (CYANOCOBALAMIN) 1000 MCG tablet Take 1,000 mcg by mouth daily.    [provider]      Allergies    Amoxicillin, Atorvastatin, Cefpodoxime proxetil, Clarithromycin, Fosamax [alendronate sodium], Furosemide, and Gabapentin    Review of Systems   Review of Systems  Respiratory:  Positive for shortness of breath.   Neurological:  Positive for dizziness.    Physical Exam Updated Vital Signs BP (!) 156/77   Pulse (!) 57   Temp 98.4 F (36.9 C) (Oral)   Resp 11   SpO2 99%  Physical Exam Vitals and nursing note reviewed.  Constitutional:      General: She is not in acute distress. HENT:     Head: Normocephalic.  Neck:     Vascular: No JVD.     Trachea: No tracheal deviation.  Cardiovascular:     Rate and Rhythm: Normal rate and regular rhythm.  Pulmonary:     Effort: Pulmonary effort is normal. No tachypnea.     Breath sounds: Normal breath sounds.  Chest:     Chest wall: No deformity, crepitus or edema. There is no dullness to percussion.  Abdominal:     Palpations: Abdomen is soft.  Musculoskeletal:     Cervical back:  Normal range of motion and neck supple.  Skin:    General: Skin is warm and dry.     Coloration: Skin is not cyanotic.  Neurological:     General: No focal deficit present.     Mental Status: She is alert and oriented to person, place, and time.     ED Results / Procedures / Treatments   Labs (all labs ordered are listed, but only abnormal results are displayed) Labs Reviewed  BASIC METABOLIC PANEL - Abnormal; Notable for the following components:      Result Value   Glucose, Bld 111 (*)    Anion gap 17 (*)    All other components within normal limits  URINALYSIS, ROUTINE W REFLEX MICROSCOPIC - Abnormal; Notable for the following components:   Color, Urine STRAW (*)    Leukocytes,Ua SMALL (*)    All other components within  normal limits  RESP PANEL BY RT-PCR (RSV, FLU A&B, COVID)  RVPGX2  CBC  BRAIN NATRIURETIC PEPTIDE  CBG MONITORING, ED  TROPONIN I (HIGH SENSITIVITY)    EKG None  Radiology No results found.  Procedures Procedures    Medications Ordered in ED Medications - No data to display  ED Course/ Medical Decision Making/ A&P                                 Medical Decision Making This is an 89 year old female who is here today for fatigue, dyspnea, right sided neck pain.  Plan-overall patient looks very well.  Her vital signs are normal, her physical exam is also reassuring.  Patient with labs done at triage, and I reviewed her CBC and BMP which are normal.  She has no leukocytosis, anemia, electrolytes and kidney function are normal.  Will order a chest x-ray on the patient, troponin, BNP.  Patient ambulatory at her baseline.  Reassessment-with the patient's report of right-sided neck pain, had also considered ordering angiography on the patient to assess for dissection.  The patient the patient's family did not wish to pursue this further.  I think that this is reasonable.  Through shared decision-making, discussed pros and cons of imaging.  Will discharge patient.  She is looking forward to going to a barbecue this evening.  Amount and/or Complexity of Data Reviewed Labs: ordered. Radiology: ordered.           Final Clinical Impression(s) / ED Diagnoses Final diagnoses:  None    Rx / DC Orders ED Discharge Orders     None         Arletha Pili, DO 02/08/23 1701

## 2023-02-08 NOTE — ED Provider Notes (Signed)
Patient here today for evaluation of dizziness, shortness of breath and right sided neck pain. EKG appears similar to prior. Recommended further evaluation in the ED for stat labs and imaging. Patient is agreeable and granddaughter will transport via POV.   Tomi Bamberger, PA-C 02/08/23 [phone]

## 2023-02-08 NOTE — Discharge Instructions (Addendum)
Your testing that was done today was normal.  You can follow-up with your primary care doctor.  Make sure again plenty of water to drink over the next few days.  Come back to the emergency department if you develop worsening of these symptoms.

## 2023-02-08 NOTE — ED Notes (Signed)
Patient is being discharged from the Urgent Care and sent to the Emergency Department via POV . Per RM, patient is in need of higher level of care due to SOB. Patient is aware and verbalizes understanding of plan of care.  Vitals:   02/08/23 1116 02/08/23 1119  BP: (!) 180/80 (!) 157/86  Pulse: 66   Resp: 20   Temp: 98.7 F (37.1 C)   SpO2: 96%

## 2023-02-11 ENCOUNTER — Ambulatory Visit (INDEPENDENT_AMBULATORY_CARE_PROVIDER_SITE_OTHER): Payer: Medicare HMO | Admitting: Family Medicine

## 2023-02-11 ENCOUNTER — Encounter: Payer: Self-pay | Admitting: Family Medicine

## 2023-02-11 VITALS — BP 128/78 | HR 56 | Temp 97.6°F | Ht 61.5 in | Wt 145.4 lb

## 2023-02-11 DIAGNOSIS — I10 Essential (primary) hypertension: Secondary | ICD-10-CM

## 2023-02-11 DIAGNOSIS — I5032 Chronic diastolic (congestive) heart failure: Secondary | ICD-10-CM | POA: Diagnosis not present

## 2023-02-11 DIAGNOSIS — R7303 Prediabetes: Secondary | ICD-10-CM | POA: Diagnosis not present

## 2023-02-11 DIAGNOSIS — Z23 Encounter for immunization: Secondary | ICD-10-CM

## 2023-02-11 DIAGNOSIS — J301 Allergic rhinitis due to pollen: Secondary | ICD-10-CM

## 2023-02-11 DIAGNOSIS — K148 Other diseases of tongue: Secondary | ICD-10-CM

## 2023-02-11 DIAGNOSIS — J309 Allergic rhinitis, unspecified: Secondary | ICD-10-CM | POA: Insufficient documentation

## 2023-02-11 DIAGNOSIS — I7789 Other specified disorders of arteries and arterioles: Secondary | ICD-10-CM

## 2023-02-11 DIAGNOSIS — M810 Age-related osteoporosis without current pathological fracture: Secondary | ICD-10-CM | POA: Diagnosis not present

## 2023-02-11 DIAGNOSIS — E2839 Other primary ovarian failure: Secondary | ICD-10-CM

## 2023-02-11 LAB — LIPID PANEL
Cholesterol: 171 mg/dL (ref 0–200)
HDL: 51.6 mg/dL (ref >39.00)
LDL Cholesterol: 87 mg/dL (ref 0–99)
NonHDL: 119.36
Total CHOL/HDL Ratio: 3
Triglycerides: 162 mg/dL — ABNORMAL HIGH (ref 0.0–149.0)
VLDL: 32.4 mg/dL (ref 0.0–40.0)

## 2023-02-11 LAB — CBC WITH DIFFERENTIAL/PLATELET
Basophils Absolute: 0 10*3/uL (ref 0.0–0.1)
Basophils Relative: 0.8 % (ref 0.0–3.0)
Eosinophils Absolute: 0.1 10*3/uL (ref 0.0–0.7)
Eosinophils Relative: 1.4 % (ref 0.0–5.0)
HCT: 42.9 % (ref 36.0–46.0)
Hemoglobin: 13.6 g/dL (ref 12.0–15.0)
Lymphocytes Relative: 29.4 % (ref 12.0–46.0)
Lymphs Abs: 1.8 10*3/uL (ref 0.7–4.0)
MCHC: 31.6 g/dL (ref 30.0–36.0)
MCV: 88.2 fl (ref 78.0–100.0)
Monocytes Absolute: 0.7 10*3/uL (ref 0.1–1.0)
Monocytes Relative: 10.8 % (ref 3.0–12.0)
Neutro Abs: 3.5 10*3/uL (ref 1.4–7.7)
Neutrophils Relative %: 57.6 % (ref 43.0–77.0)
Platelets: 215 10*3/uL (ref 150.0–400.0)
RBC: 4.86 Mil/uL (ref 3.87–5.11)
RDW: 13.2 % (ref 11.5–15.5)
WBC: 6.2 10*3/uL (ref 4.0–10.5)

## 2023-02-11 LAB — COMPREHENSIVE METABOLIC PANEL
ALT: 13 U/L (ref 0–35)
AST: 17 U/L (ref 0–37)
Albumin: 4.2 g/dL (ref 3.5–5.2)
Alkaline Phosphatase: 82 U/L (ref 39–117)
BUN: 18 mg/dL (ref 6–23)
CO2: 29 meq/L (ref 19–32)
Calcium: 10.2 mg/dL (ref 8.4–10.5)
Chloride: 103 meq/L (ref 96–112)
Creatinine, Ser: 0.84 mg/dL (ref 0.40–1.20)
GFR: 61.89 mL/min (ref >60.00)
Glucose, Bld: 85 mg/dL (ref 70–99)
Potassium: 4.2 meq/L (ref 3.5–5.1)
Sodium: 140 meq/L (ref 135–145)
Total Bilirubin: 0.6 mg/dL (ref 0.2–1.2)
Total Protein: 7.1 g/dL (ref 6.0–8.3)

## 2023-02-11 LAB — HEMOGLOBIN A1C: Hgb A1c MFr Bld: 6.1 % (ref 4.6–6.5)

## 2023-02-11 NOTE — Assessment & Plan Note (Signed)
A1c ordered disc imp of low glycemic diet and wt loss to prevent DM2  

## 2023-02-11 NOTE — Progress Notes (Signed)
Subjective:    Patient ID: Sara [name], female    DOB: [birth year], 89 y.o.   MRN: [id number]  HPI  Wt Readings from Last 3 Encounters:  02/11/23 145 lb 6 oz (65.9 kg)  02/08/23 145 lb 15.1 oz (66.2 kg)  02/06/23 146 lb (66.2 kg)   27.02 kg/m  Vitals:   02/11/23 1045  BP: 128/78  Pulse: (!) 56  Temp: 97.6 F (36.4 C)  SpO2: 95%   Pt presents for follow up of chronic medical problems and ER visit on 8/31 HTN, prediabetes and hyperlipidemia  Also a spot on her tongue   Pt presented to ER on 8/31 for c/o dizziness and shortness of breath She has history of diastoic HF in past   Spot on tongue  Over a year  Not bothersome -just aware it is there   Had normal vitals and exam  Labs were reassuring     Lab Results  Component Value Date   NA 140 02/08/2023   K 3.9 02/08/2023   CO2 23 02/08/2023   GLUCOSE 111 (H) 02/08/2023   BUN 12 02/08/2023   CREATININE 0.81 02/08/2023   CALCIUM 10.1 02/08/2023   GFR 62.32 02/12/2022   GFRNONAA >60 02/08/2023   Lab Results  Component Value Date   WBC 7.1 02/08/2023   HGB 14.3 02/08/2023   HCT 44.8 02/08/2023   MCV 89.2 02/08/2023   PLT 206 02/08/2023   Urinalysis clear with tr leuk BNP  227 (lower than past)  Troponin 29, 27  Neg resp panel  DG Chest 1 View  Result Date: 02/08/2023 CLINICAL DATA:  Dyspnea, dizziness, short of breath EXAM: CHEST  1 VIEW COMPARISON:  11/16/2019 FINDINGS: Single frontal view of the chest demonstrates mild enlargement of the cardiac silhouette, which may be due to AP portable technique and positioning. No airspace disease, effusion, or pneumothorax. No acute bony abnormalities. IMPRESSION: 1. Enlarged cardiac silhouette, likely projectional. 2. No acute airspace disease. Electronically Signed   By: Sharlet Salina M.D.   On: 02/08/2023 14:52    Less dizzy now  ? If it was sinuses  Her baseline is mildly shortness of breath and fatigued - age? Diastolic dysfunction Is cold  natured   HTN bp is stable today  No cp or palpitations or headaches or edema  No side effects to medicines  BP Readings from Last 3 Encounters:  02/11/23 128/78  02/08/23 (!) 142/105  02/08/23 (!) 157/86   Diltiazem 120 mg daily  hctz 25 mg daily   Pulse Readings from Last 3 Encounters:  02/11/23 (!) 56  02/08/23 (!) 59  02/08/23 66   This is baseline for bradycardia  Has CAD Takes low dose asa    Hyperlipidemia  Lab Results  Component Value Date   CHOL 182 02/12/2022   HDL 53.90 02/12/2022   LDLCALC 98 02/12/2022   LDLDIRECT 143.1 06/16/2013   TRIG 149.0 02/12/2022   CHOLHDL 3 02/12/2022   Intol of all statins   Prediabetes Lab Results  Component Value Date   HGBA1C 6.3 02/12/2022   Osteoporosis  Dexa 2022  Falls-none  Fracture -none  Intol of bisphos oral  Taking vit D Some walking      Patient Active Problem List   Diagnosis Date Noted   Tongue lesion 02/11/2023   Allergic rhinitis 02/11/2023   Skin lump of arm, right 02/12/2022   Aortic root enlargement (HCC) 01/09/2021   Neck pain 01/18/2020   Left shoulder pain 01/18/2020  Pain in both upper arms 01/18/2020   Thyroid nodule 01/04/2020   Flank pain 11/22/2019   Hand pain, right 10/18/2019   Family history of breast cancer    Family history of prostate cancer    Family history of lung cancer    Carcinoma of upper-outer quadrant of right breast in female, estrogen receptor positive (HCC) 02/23/2019   Pre-operative clearance 02/21/2019   History of breast cancer 02/21/2019   Medicare annual wellness visit, subsequent 01/05/2019   Encounter for screening mammogram for breast cancer 01/05/2019   Knee pain, left 10/14/2018   Arthritis of left hip 07/29/2018   Bradycardia 07/01/2018   Left ear pain 07/01/2018   Chronic diastolic heart failure (HCC) 12/27/2017   Lumbar spinal stenosis 03/11/2017   Diastolic dysfunction 01/14/2017   Estrogen deficiency 12/16/2016   Routine general medical  examination at a health care facility 12/16/2016   CAD (coronary artery disease) 12/04/2016   Mucocele, 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   Eczema 03/06/2015   Prediabetes 02/11/2014   CONSTIPATION, CHRONIC 07/03/2010   Osteoporosis 06/24/2008   HYPERCHOLESTEROLEMIA 12/08/2006   Essential hypertension 12/08/2006   GERD 12/08/2006   H/O herpes zoster 12/08/2006   Past Medical History:  Diagnosis Date   Allergy    CAD (coronary artery disease)    Chronic diastolic (congestive) heart failure (HCC)    Clinical trial participant    [facility] Genetic studies in familial dementia   Dyspnea 11/28/2016   Family history of breast cancer    Family history of lung cancer    Family history of prostate cancer    Frequent UTI    GERD (gastroesophageal reflux disease)    Hypertension    Osteoarthritis    hips, back   Osteopenia    Pneumonia    Spinal stenosis    Stroke (HCC) 1985   Past Surgical History:  Procedure Laterality Date   ABDOMINAL HYSTERECTOMY     partial has one ovary   APPENDECTOMY     BREAST LUMPECTOMY WITH RADIOACTIVE SEED LOCALIZATION Right 04/07/2019   Procedure: RIGHT BREAST LUMPECTOMY WITH RADIOACTIVE SEED LOCALIZATION;  Surgeon: Griselda Miner, MD;  Location: [facility] OR;  Service: General;  Laterality: Right;   CATARACT EXTRACTION Bilateral    LEFT HEART CATH AND CORONARY ANGIOGRAPHY N/A 11/29/2016   Procedure: Left Heart Cath and Coronary Angiography;  Surgeon: Tonny Bollman, MD;  Location: [facility] INVASIVE CV LAB;  Service: Cardiovascular;  Laterality: N/A;   LUMBAR DISC SURGERY     x 3   rotator cuff surgery Right 2010   Social History   Tobacco Use   Smoking status: Never   Smokeless tobacco: Never  Vaping Use   Vaping status: Never Used  Substance Use Topics   Alcohol use: No    Alcohol/week: 0.0 standard drinks of alcohol   Drug use: No   Family History  Problem Relation Age of Onset   Lung cancer Brother 15   [name] Mother        kidney tumor   Hypertension Mother     Diabetes Sister    Diabetes Brother    Diabetes Sister        x 2   Colon cancer Maternal Uncle    [name] Sister        intestine burst   Breast cancer Maternal Aunt        x 2   Breast cancer Daughter        unknown cancer   Diabetes Son  x 3   Diabetes Daughter    Breast cancer Sister        x2, diagnosed in 49s and 49s   Prostate cancer Maternal Uncle    Esophageal cancer Neg Hx    Rectal cancer Neg Hx    Stomach cancer Neg Hx    Allergies  Allergen Reactions   Amoxicillin     Reaction not known   Atorvastatin     aching   Cefpodoxime Proxetil     Reaction not known   Clarithromycin     Reaction not known   Fosamax [Alendronate Sodium]     Body pain    Furosemide     REACTION: doesn't help   Gabapentin Swelling    Caused severe swellling   Current Outpatient Medications on File Prior to Visit  Medication Sig Dispense Refill   acetaminophen (TYLENOL) 500 MG tablet Take 1,000 mg by mouth every 6 (six) hours as needed.     aspirin EC 81 MG tablet Take 81 mg by mouth daily.       Cholecalciferol (VITAMIN D-3) 1000 UNITS CAPS Take 1,000 Units by mouth daily.      diltiazem (CARDIZEM CD) 120 MG 24 hr capsule TAKE 1 CAPSULE EVERY DAY 90 capsule 0   Docusate Sodium (EQ STOOL SOFTENER PO) Take 100 mg by mouth daily as needed (constipation).     doxylamine, Sleep, (UNISOM) 25 MG tablet Take 25 mg by mouth at bedtime as needed for sleep.      hydrochlorothiazide (HYDRODIURIL) 25 MG tablet TAKE 1 TABLET EVERY DAY 90 tablet 0   nitroGLYCERIN (NITROSTAT) 0.4 MG SL tablet Place 1 tablet (0.4 mg total) under the tongue every 5 (five) minutes as needed for chest pain (max 3 doses in 15 minutes). 25 tablet 3   polyethylene glycol (MIRALAX / GLYCOLAX) packet Take 17 g by mouth daily.      potassium chloride (KLOR-CON) 10 MEQ tablet TAKE 1 TABLET EVERY DAY 90 tablet 3   vitamin B-12 (CYANOCOBALAMIN) 1000 MCG tablet Take 1,000 mcg by mouth daily.     No current  facility-administered medications on file prior to visit.    Review of Systems  Constitutional:  Positive for fatigue. Negative for activity change, appetite change, fever and unexpected weight change.  HENT:  Negative for congestion, ear pain, rhinorrhea, sinus pressure and sore throat.   Eyes:  Negative for pain, redness and visual disturbance.  Respiratory:  Negative for cough, shortness of breath and wheezing.        Shortness of breath on exertion- is baseline/ no change   Cardiovascular:  Negative for chest pain and palpitations.  Gastrointestinal:  Negative for abdominal pain, blood in stool, constipation and diarrhea.  Endocrine: Negative for polydipsia and polyuria.  Genitourinary:  Negative for dysuria, frequency and urgency.  Musculoskeletal:  Positive for arthralgias. Negative for back pain and myalgias.  Skin:  Negative for pallor and rash.  Allergic/Immunologic: Negative for environmental allergies.  Neurological:  Positive for dizziness. Negative for tremors, seizures, syncope, facial asymmetry, speech difficulty, weakness, numbness and headaches.  Hematological:  Negative for adenopathy. Does not bruise/bleed easily.  Psychiatric/Behavioral:  Negative for decreased concentration and dysphoric mood. The patient is not nervous/anxious.        Objective:   Physical Exam Constitutional:      General: She is not in acute distress.    Appearance: Normal appearance. She is well-developed and normal weight. She is not ill-appearing or diaphoretic.  HENT:  Head: Normocephalic and atraumatic.     Mouth/Throat:     Mouth: Mucous membranes are moist.  Eyes:     General: No scleral icterus.    Conjunctiva/sclera: Conjunctivae normal.     Pupils: Pupils are equal, round, and reactive to light.  Neck:     Thyroid: No thyromegaly.     Vascular: No carotid bruit or JVD.  Cardiovascular:     Rate and Rhythm: Regular rhythm. Bradycardia present.     Heart sounds: Normal heart  sounds.     No gallop.  Pulmonary:     Effort: Pulmonary effort is normal. No respiratory distress.     Breath sounds: Normal breath sounds. No stridor. No wheezing, rhonchi or rales.     Comments: No crackles  Abdominal:     General: There is no distension or abdominal bruit.     Palpations: Abdomen is soft. There is no mass.     Tenderness: There is no abdominal tenderness. There is no guarding.  Musculoskeletal:     Cervical back: Normal range of motion and neck supple.     Right lower leg: No edema.     Left lower leg: No edema.  Lymphadenopathy:     Cervical: No cervical adenopathy.  Skin:    General: Skin is warm and dry.     Coloration: Skin is not pale.     Findings: No rash.  Neurological:     Mental Status: She is alert.     Cranial Nerves: No cranial nerve deficit.     Motor: No weakness.     Coordination: Coordination normal.     Gait: Gait normal.     Deep Tendon Reflexes: Reflexes are normal and symmetric. Reflexes normal.  Psychiatric:        Mood and Affect: Mood normal.           Assessment & Plan:   Problem List Items Addressed This Visit       Cardiovascular and Mediastinum   Chronic diastolic heart failure (HCC) - Primary    Unsure if this is what caused shortness of breath day of ER visit Reviewed hospital records, lab results and studies in detail   Improved today  Some degree of exercise intol Will follow up with cardiology on schedule       Essential hypertension    bp in fair control at this time  BP Readings from Last 1 Encounters:  02/11/23 128/78    Most recent labs reviewed  Disc lifstyle change with low sodium diet and exercise  Labs ordered  Plan to continue Diltiazem 120 mg daily  hctz 25 mg daily        Relevant Orders   TSH   Lipid panel   Comprehensive metabolic panel   CBC with Differential/Platelet     Respiratory   Allergic rhinitis    This may add to occational positional dizziness/ear fullness Recommend  trial of claritin 10 mg daily for sneezing/rhinorrhea and above  Update if not starting to improve in a week or if worsening  Call back and Er precautions noted in detail today          Digestive   Tongue lesion    Small round /smooth lesion on right lat tongue (long time?)  Resembles tastebud hypertrophy or cyst   Ref to ENT for further eval      Relevant Orders   Ambulatory referral to ENT     Musculoskeletal and Integument   Osteoporosis  Due for dexa Ordered   Pt will call to schedule         [name]   Aortic root enlargement (HCC)    Continue to control lipids and HTN No symptoms or clinical change Sees cardiology      Estrogen deficiency    Dexa ordered       Relevant Orders   DG Bone Density   Prediabetes    A1c ordered disc imp of low glycemic diet and wt loss to prevent DM2       Relevant Orders   Hemoglobin A1c   [name] Visit Diagnoses     Need for influenza vaccination       Relevant Orders   Flu Vaccine Trivalent High Dose (Fluad) (Completed)

## 2023-02-11 NOTE — Assessment & Plan Note (Signed)
Small round /smooth lesion on right lat tongue (long time?)  Resembles tastebud hypertrophy or cyst   Ref to ENT for further eval

## 2023-02-11 NOTE — Patient Instructions (Addendum)
I put the referral in for ENT specialist for tongue  Please let us know if you don't hear in 1-2 weeks   Take care of yourself   Labs today   Flu shot today also   For allergy /sinus problems Try claritin 10 mg daily (store brand is fine)    You are due for a bone density test  Please call and schedule later this week  You have an order for:  []   2D Mammogram  []   3D Mammogram  [x]   Bone Density     Please call for appointment:   []   [facility]  [address] [place] [address]  [phone]  []   [facility])   [address]  [place], [place] [address]  [phone]  []   [facility]      [address] [place], [place]        [phone]         [x]   [facility]  [address] [place], [place]  [phone]  []  [facility]   [address]   [place], [place] [address]  [phone]  []  [facility]  [address] [place], [place] [address] [phone]    Make sure to wear two piece clothing  No lotions powders or deodorants the day of the appointment Make sure to bring picture ID and insurance card.  Bring list of medications you are currently taking including any supplements.   Schedule your screening mammogram through MyChart!   Select [facility] imaging sites can now be scheduled through MyChart.  Log into your MyChart account.  Go to 'Visit' (or 'Appointments' if  on mobile App) --> Schedule an  Appointment  Under 'Select a Reason for Visit' choose the Mammogram  Screening option.  Complete the pre-visit questions  and select the time and place that  best fits your schedule

## 2023-02-11 NOTE — Assessment & Plan Note (Signed)
bp in fair control at this time  BP Readings from Last 1 Encounters:  02/11/23 128/78    Most recent labs reviewed  Disc lifstyle change with low sodium diet and exercise  Labs ordered  Plan to continue Diltiazem 120 mg daily  hctz 25 mg daily

## 2023-02-11 NOTE — Assessment & Plan Note (Signed)
Due for dexa Ordered   Pt will call to schedule

## 2023-02-11 NOTE — Assessment & Plan Note (Signed)
Unsure if this is what caused shortness of breath day of ER visit Reviewed hospital records, lab results and studies in detail   Improved today  Some degree of exercise intol Will follow up with cardiology on schedule

## 2023-02-11 NOTE — Assessment & Plan Note (Signed)
Continue to control lipids and HTN No symptoms or clinical change Sees cardiology

## 2023-02-11 NOTE — Assessment & Plan Note (Signed)
This may add to occational positional dizziness/ear fullness Recommend trial of claritin 10 mg daily for sneezing/rhinorrhea and above  Update if not starting to improve in a week or if worsening  Call back and Er precautions noted in detail today

## 2023-02-11 NOTE — Assessment & Plan Note (Signed)
Dexa ordered

## 2023-02-12 LAB — TSH: TSH: 1.3 u[IU]/mL (ref 0.35–5.50)

## 2023-02-24 ENCOUNTER — Telehealth: Payer: Self-pay

## 2023-02-24 NOTE — Telephone Encounter (Signed)
Transition Care Management Follow-up Telephone Call Date of discharge and from where: [facility] 8/31 How have you been since you were released from the hospital? Pt stated she is doing good and has followed up with Providers Any questions or concerns? No  Items Reviewed: Did the pt receive and understand the discharge instructions provided? Yes  Medications obtained and verified? Yes  Other? No  Any new allergies since your discharge? No  Dietary orders reviewed? No Do you have support at home? No     Follow up appointments reviewed:  PCP Hospital f/u appt confirmed? No  Scheduled to see  on  @ . Specialist Hospital f/u appt confirmed? Yes  Scheduled to see  on  @ . Are transportation arrangements needed? No  If their condition worsens, is the pt aware to call PCP or go to the Emergency Dept.? Yes Was the patient provided with contact information for the PCP's office or ED? Yes Was to pt encouraged to call back with questions or concerns? Yes

## 2023-03-18 DIAGNOSIS — E2839 Other primary ovarian failure: Secondary | ICD-10-CM | POA: Diagnosis not present

## 2023-03-18 DIAGNOSIS — Z1231 Encounter for screening mammogram for malignant neoplasm of breast: Secondary | ICD-10-CM | POA: Diagnosis not present

## 2023-03-18 DIAGNOSIS — M8588 Other specified disorders of bone density and structure, other site: Secondary | ICD-10-CM | POA: Diagnosis not present

## 2023-03-18 LAB — HM DEXA SCAN

## 2023-03-18 LAB — HM MAMMOGRAPHY

## 2023-03-19 ENCOUNTER — Encounter: Payer: Self-pay | Admitting: Family Medicine

## 2023-03-20 ENCOUNTER — Encounter: Payer: Self-pay | Admitting: Family Medicine

## 2023-03-24 ENCOUNTER — Telehealth: Payer: Self-pay | Admitting: Family Medicine

## 2023-03-24 DIAGNOSIS — M810 Age-related osteoporosis without current pathological fracture: Secondary | ICD-10-CM

## 2023-03-24 NOTE — Telephone Encounter (Signed)
Patient called in and would like go ahead with the referral to the endocrinologist. Thank you!

## 2023-03-24 NOTE — Telephone Encounter (Signed)
I put the referral in  Please let us know if you don't hear in 1-2 weeks

## 2023-03-24 NOTE — Telephone Encounter (Signed)
See PCP's comments on last DEXA

## 2023-03-31 DIAGNOSIS — M810 Age-related osteoporosis without current pathological fracture: Secondary | ICD-10-CM | POA: Diagnosis not present

## 2023-04-03 ENCOUNTER — Institutional Professional Consult (permissible substitution) (INDEPENDENT_AMBULATORY_CARE_PROVIDER_SITE_OTHER): Payer: Medicare HMO | Admitting: Otolaryngology

## 2023-04-14 ENCOUNTER — Other Ambulatory Visit: Payer: Self-pay | Admitting: Family Medicine

## 2023-04-16 ENCOUNTER — Inpatient Hospital Stay: Payer: Medicare HMO | Attending: Adult Health | Admitting: Adult Health

## 2023-04-16 ENCOUNTER — Encounter: Payer: Self-pay | Admitting: Adult Health

## 2023-04-16 ENCOUNTER — Inpatient Hospital Stay: Payer: Medicare HMO

## 2023-04-16 VITALS — BP 174/80 | HR 72 | Temp 97.5°F | Resp 18 | Wt 145.6 lb

## 2023-04-16 DIAGNOSIS — M79642 Pain in left hand: Secondary | ICD-10-CM | POA: Diagnosis not present

## 2023-04-16 DIAGNOSIS — M810 Age-related osteoporosis without current pathological fracture: Secondary | ICD-10-CM | POA: Insufficient documentation

## 2023-04-16 DIAGNOSIS — C50411 Malignant neoplasm of upper-outer quadrant of right female breast: Secondary | ICD-10-CM

## 2023-04-16 DIAGNOSIS — Z170 Estrogen receptor positive status [ER+]: Secondary | ICD-10-CM | POA: Diagnosis not present

## 2023-04-16 DIAGNOSIS — G8929 Other chronic pain: Secondary | ICD-10-CM | POA: Diagnosis not present

## 2023-04-16 DIAGNOSIS — K219 Gastro-esophageal reflux disease without esophagitis: Secondary | ICD-10-CM | POA: Insufficient documentation

## 2023-04-16 DIAGNOSIS — R52 Pain, unspecified: Secondary | ICD-10-CM | POA: Diagnosis not present

## 2023-04-16 DIAGNOSIS — G6289 Other specified polyneuropathies: Secondary | ICD-10-CM | POA: Diagnosis not present

## 2023-04-16 DIAGNOSIS — E7800 Pure hypercholesterolemia, unspecified: Secondary | ICD-10-CM | POA: Diagnosis not present

## 2023-04-16 DIAGNOSIS — M79641 Pain in right hand: Secondary | ICD-10-CM | POA: Insufficient documentation

## 2023-04-16 DIAGNOSIS — Z8673 Personal history of transient ischemic attack (TIA), and cerebral infarction without residual deficits: Secondary | ICD-10-CM | POA: Diagnosis not present

## 2023-04-16 DIAGNOSIS — M1990 Unspecified osteoarthritis, unspecified site: Secondary | ICD-10-CM | POA: Insufficient documentation

## 2023-04-16 DIAGNOSIS — I5032 Chronic diastolic (congestive) heart failure: Secondary | ICD-10-CM | POA: Diagnosis not present

## 2023-04-16 DIAGNOSIS — Z8042 Family history of malignant neoplasm of prostate: Secondary | ICD-10-CM | POA: Insufficient documentation

## 2023-04-16 DIAGNOSIS — Z79811 Long term (current) use of aromatase inhibitors: Secondary | ICD-10-CM | POA: Diagnosis not present

## 2023-04-16 DIAGNOSIS — F0390 Unspecified dementia without behavioral disturbance: Secondary | ICD-10-CM | POA: Diagnosis not present

## 2023-04-16 DIAGNOSIS — Z803 Family history of malignant neoplasm of breast: Secondary | ICD-10-CM | POA: Insufficient documentation

## 2023-04-16 DIAGNOSIS — Z853 Personal history of malignant neoplasm of breast: Secondary | ICD-10-CM | POA: Diagnosis not present

## 2023-04-16 DIAGNOSIS — Z639 Problem related to primary support group, unspecified: Secondary | ICD-10-CM | POA: Insufficient documentation

## 2023-04-16 LAB — CBC WITH DIFFERENTIAL (CANCER CENTER ONLY)
Abs Immature Granulocytes: 0.01 10*3/uL (ref 0.00–0.07)
Basophils Absolute: 0.1 10*3/uL (ref 0.0–0.1)
Basophils Relative: 1 %
Eosinophils Absolute: 0.1 10*3/uL (ref 0.0–0.5)
Eosinophils Relative: 1 %
HCT: 43.2 % (ref 36.0–46.0)
Hemoglobin: 13.6 g/dL (ref 12.0–15.0)
Immature Granulocytes: 0 %
Lymphocytes Relative: 23 %
Lymphs Abs: 1.5 10*3/uL (ref 0.7–4.0)
MCH: 28 pg (ref 26.0–34.0)
MCHC: 31.5 g/dL (ref 30.0–36.0)
MCV: 89.1 fL (ref 80.0–100.0)
Monocytes Absolute: 0.6 10*3/uL (ref 0.1–1.0)
Monocytes Relative: 10 %
Neutro Abs: 4.2 10*3/uL (ref 1.7–7.7)
Neutrophils Relative %: 65 %
Platelet Count: 215 10*3/uL (ref 150–400)
RBC: 4.85 MIL/uL (ref 3.87–5.11)
RDW: 13.1 % (ref 11.5–15.5)
WBC Count: 6.4 10*3/uL (ref 4.0–10.5)
nRBC: 0 % (ref 0.0–0.2)

## 2023-04-16 LAB — VITAMIN B12: Vitamin B-12: 546 pg/mL (ref 180–914)

## 2023-04-16 LAB — FOLATE: Folate: 8.1 ng/mL (ref >5.9)

## 2023-04-16 NOTE — Progress Notes (Signed)
Cancer Center Cancer Follow up:    Tower, Sara Gallus, MD [address] [place] [address]   DIAGNOSIS:  Cancer Staging  Carcinoma of upper-outer quadrant of right breast in female, estrogen receptor positive (HCC) Staging form: Breast, AJCC 8th Edition - Clinical stage from 02/23/2019: cT1c, cN0, cM0, GX, ER+, PR+, HER2- - Signed by Lonie Peak, MD on 02/23/2019 Stage prefix: Initial diagnosis Histologic grading system: 3 grade system - Pathologic stage from 04/07/2019: Stage IA (pT1c, pN0, cM0, G2, ER+, PR+, HER2-) - Signed by Loa Socks, NP on 07/19/2019 Stage prefix: Initial diagnosis Histologic grading system: 3 grade system   SUMMARY OF ONCOLOGIC HISTORY: Oncology History  Carcinoma of upper-outer quadrant of right breast in female, estrogen receptor positive (HCC)  02/23/2019 Initial Diagnosis   Routine screening mammogram detected a 1.2cm mass in the right breast. Biopsy showed invasive mammary carcinoma with mammary carcinoma in situ, HER-2 - (1+), ER+ 100%, PR+ 5% Ki67 2%.    02/23/2019 Cancer Staging   Staging form: Breast, AJCC 8th Edition - Clinical stage from 02/23/2019: cT1c, cN0, cM0, GX, ER+, PR+, HER2-    04/07/2019 Cancer Staging   Staging form: Breast, AJCC 8th Edition - Pathologic stage from 04/07/2019: Stage IA (pT1c, pN0, cM0, G2, ER+, PR+, HER2-)    04/07/2019 Surgery   Right lumpectomy Carolynne Edouard) [phone]): IDC, grade 2, 1.3cm, with intermediate grade DCIS, clear margins. No regional lymph nodes were examined.   05/10/2019 - 05/28/2019 Radiation Therapy   The patient initially received a dose of 40.05 Gy in 15 fractions to the right breast and axilla using whole-breast tangent fields. This was delivered using a 3-D conformal technique. The pt did not receive a boost. The total dose was 40.05 Gy.   04/2019 - 04/2024 Anti-estrogen oral therapy   Anastrozole     CURRENT THERAPY: observation  INTERVAL  HISTORY: Everlene Other 89 y.o. female  with a history of breast cancer, presents with persistent burning pain, particularly in the legs. She reports that the pain is severe enough to disrupt sleep and daily activities. The patient attributes this pain to anastrozole, a medication she stopped taking in November 2022 due to hand pain. Despite discontinuing the medication, the burning pain has persisted and even worsened. The patient also reports a change in the color of her legs, which she describes as appearing "burned."  In addition to the burning pain, the patient has noticed a growth on her tongue, which does not cause discomfort. She also reports difficulty with fine motor tasks such as writing and buttoning due to the pain in her hands.  Maia reports feeling upset due to family issues and expresses frustration with her current health status. The patient's quality of life appears to be impacted by her physical symptoms and emotional distress.  The patient was seen by an endocrinologist. She has been advised to consider a shot for osteoporosis but expresses fear that it may increase her pain. The patient is currently taking vitamin B12 and vitamin D supplements. She has also tried over-the-counter pain relief with limited success.  The patient's most recent mammogram, conducted on March 18, 2023, showed no evidence of malignancy. She continues to monitor her breast health annually due to her history of breast cancer.   Patient Active Problem List   Diagnosis Date Noted   Tongue lesion 02/11/2023   Allergic rhinitis 02/11/2023   Skin lump of arm, right 02/12/2022   Aortic root enlargement (HCC) 01/09/2021  Neck pain 01/18/2020   Left shoulder pain 01/18/2020   Pain in both upper arms 01/18/2020   Thyroid nodule 01/04/2020   Flank pain 11/22/2019   Hand pain, right 10/18/2019   Family history of breast cancer    Family history of prostate cancer    Family history of lung  cancer    Carcinoma of upper-outer quadrant of right breast in female, estrogen receptor positive (HCC) 02/23/2019   Pre-operative clearance 02/21/2019   History of breast cancer 02/21/2019   Medicare annual wellness visit, subsequent 01/05/2019   Encounter for screening mammogram for breast cancer 01/05/2019   Knee pain, left 10/14/2018   Arthritis of left hip 07/29/2018   Bradycardia 07/01/2018   Left ear pain 07/01/2018   Chronic diastolic heart failure (HCC) 12/27/2017   Lumbar spinal stenosis 03/11/2017   Diastolic dysfunction 01/14/2017   Estrogen deficiency 12/16/2016   Routine general medical examination at a health care facility 12/16/2016   CAD (coronary artery disease) 12/04/2016   Mucocele, 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  MEDICAL HISTORY: Past Medical History:  Diagnosis Date   Allergy    CAD (coronary artery disease)    Chronic diastolic (congestive) heart failure (HCC)    Clinical trial participant    [facility] Genetic studies in familial dementia   Dyspnea 11/28/2016   Family history of breast cancer    Family history of lung cancer    Family history of prostate cancer    Frequent UTI    GERD (gastroesophageal reflux disease)    Hypertension    Osteoarthritis    hips, back   Osteopenia    Pneumonia    Spinal stenosis    Stroke [facility]) 1985    SURGICAL HISTORY: Past Surgical History:  Procedure Laterality Date   ABDOMINAL HYSTERECTOMY     partial has one ovary   APPENDECTOMY     BREAST LUMPECTOMY WITH RADIOACTIVE SEED LOCALIZATION Right 04/07/2019   Procedure: RIGHT BREAST LUMPECTOMY WITH  RADIOACTIVE SEED LOCALIZATION;  Surgeon: Griselda Miner, MD;  Location: [facility] OR;  Service: General;  Laterality: Right;   CATARACT EXTRACTION Bilateral    LEFT HEART CATH AND CORONARY ANGIOGRAPHY N/A 11/29/2016   Procedure: Left Heart Cath and Coronary Angiography;  Surgeon: Tonny Bollman, MD;  Location: [facility] INVASIVE CV LAB;  Service: Cardiovascular;  Laterality: N/A;   LUMBAR DISC SURGERY     x 3   rotator cuff surgery Right 2010    SOCIAL HISTORY: Social History   Socioeconomic History   Marital status: Widowed    Spouse name: Not on file   Number of children: 6   Years of education: Not on file   Highest education level: Not on file  Occupational History   Occupation: RETIRED    Employer: RETIRED  Tobacco Use   Smoking status: Never   Smokeless tobacco: Never  Vaping Use   Vaping status: Never Used  Substance and Sexual Activity   Alcohol use: No    Alcohol/week: 0.0 standard drinks of alcohol   Drug use: No   Sexual activity: Not Currently  Other Topics Concern   Not on file  Social History Narrative   Not on file   Social  Determinants of Health   Financial Resource Strain: Low Risk  (01/20/2023)   Overall Financial Resource Strain (CARDIA)    Difficulty of Paying Living Expenses: Not hard at all  Food Insecurity: No Food Insecurity (01/20/2023)   Hunger Vital Sign    Worried About Running Out of Food in the Last Year: Never true    Ran Out of Food in the Last Year: Never true  Transportation Needs: No Transportation Needs (01/20/2023)   PRAPARE - Administrator, Civil Service (Medical): No    Lack of Transportation (Non-Medical): No  Physical Activity: Insufficiently Active (01/20/2023)   Exercise Vital Sign    Days of Exercise per Week: 7 days    Minutes of Exercise per Session: 10 min  Stress: No Stress Concern Present (01/20/2023)   [employer] of Occupational Health - Occupational Stress Questionnaire    Feeling of Stress : Not at all  Social  Connections: Moderately Integrated (01/20/2023)   Social Connection and Isolation Panel [NHANES]    Frequency of Communication with Friends and Family: More than three times a week    Frequency of Social Gatherings with Friends and Family: More than three times a week    Attends Religious Services: More than 4 times per year    Active Member of [employer] or Organizations: Yes    Attends Banker Meetings: Never    Marital Status: Widowed  Recent Concern: Social Connections - Moderately Isolated (01/20/2023)   Social Connection and Isolation Panel [NHANES]    Frequency of Communication with Friends and Family: More than three times a week    Frequency of Social Gatherings with Friends and Family: More than three times a week    Attends Religious Services: More than 4 times per year    Active Member of [employer] or Organizations: No    Attends Banker Meetings: Never    Marital Status: Widowed  Intimate Partner Violence: Not At Risk (01/20/2023)   Humiliation, Afraid, Rape, and Kick questionnaire    Fear of Current or Ex-Partner: No    Emotionally Abused: No    Physically Abused: No    Sexually Abused: No    FAMILY HISTORY: Family History  Problem Relation Age of Onset   Lung cancer Brother 42   Other Mother        kidney tumor   Hypertension Mother    Diabetes Sister    Diabetes Brother    Diabetes Sister        x 2   Colon cancer Maternal Uncle    Other Sister        intestine burst   Breast cancer Maternal Aunt        x 2   Breast cancer Daughter        unknown cancer   Diabetes Son        x 3   Diabetes Daughter    Breast cancer Sister        x2, diagnosed in 77s and 69s   Prostate cancer Maternal Uncle    Esophageal cancer Neg Hx    Rectal cancer Neg Hx    Stomach cancer Neg Hx     Review of Systems  Constitutional:  Negative for appetite change, chills, fatigue, fever and unexpected weight change.  HENT:   Negative for hearing loss, lump/mass,  sore throat, trouble swallowing and voice change.   Eyes:  Negative for eye problems and icterus.  Respiratory:  Negative for chest tightness,  cough and shortness of breath.   Cardiovascular:  Negative for chest pain, leg swelling and palpitations.  Gastrointestinal:  Negative for abdominal distention, abdominal pain, constipation, diarrhea, nausea and vomiting.  Endocrine: Negative for hot flashes.  Genitourinary:  Negative for difficulty urinating.   Musculoskeletal:  Negative for arthralgias and gait problem.  Skin:  Negative for itching and rash.  Neurological:  Positive for numbness. Negative for dizziness, extremity weakness, gait problem and headaches.  Hematological:  Negative for adenopathy. Does not bruise/bleed easily.  Psychiatric/Behavioral:  Negative for depression. The patient is not nervous/anxious.       PHYSICAL EXAMINATION    Vitals:   04/16/23 0959  BP: (!) 174/80  Pulse: 72  Resp: 18  Temp: (!) 97.5 F (36.4 C)  SpO2: 96%    Physical Exam Constitutional:      General: She is not in acute distress.    Appearance: Normal appearance. She is not toxic-appearing.  HENT:     Head: Normocephalic and atraumatic.     Mouth/Throat:     Mouth: Mucous membranes are moist.     Pharynx: Oropharynx is clear. No oropharyngeal exudate or posterior oropharyngeal erythema.  Eyes:     General: No scleral icterus. Cardiovascular:     Rate and Rhythm: Normal rate and regular rhythm.     Pulses: Normal pulses.     Heart sounds: Normal heart sounds.  Pulmonary:     Effort: Pulmonary effort is normal.     Breath sounds: Normal breath sounds.  Chest:     Comments: Right breast s/p lumpectomy and radiation, no sign of local recurrence, left breast benign.  Abdominal:     General: Abdomen is flat. Bowel sounds are normal. There is no distension.     Palpations: Abdomen is soft.     Tenderness: There is no abdominal tenderness.  Musculoskeletal:        General: No  swelling.     Cervical back: Neck supple.  Lymphadenopathy:     Cervical: No cervical adenopathy.  Skin:    General: Skin is warm and dry.     Findings: No rash.  Neurological:     General: No focal deficit present.     Mental Status: She is alert.  Psychiatric:        Mood and Affect: Mood normal.        Behavior: Behavior normal.       ASSESSMENT and THERAPY PLAN:   Carcinoma of upper-outer quadrant of right breast in female, estrogen receptor positive (HCC) 04/25/2019:Routine screening mammogram detected a 1.2cm mass in the right breast. Biopsy showed invasive mammary carcinoma with mammary carcinoma in situ, HER-2 - (1+), ER+ 100%, PR+ 5% Ki67 2%.  T1c N0 stage Ia clinical stage   Recommendations: 1.  Right lumpectomy 04/07/2019: Grade 2 IDC 1.3 cm with intermediate grade DCIS, margins negative, negative for lymphovascular or perineural invasion, ER 100%, PR 5%, HER-2 -1+, Ki-67 2%, T1c Nx stage Ia 2. did not recommend adjuvant radiation therapy  3. Adjuvant antiestrogen therapy with anastrozole 04/2019-04/2021 stopped secondary to hand pain  History of Breast Cancer On observation alone. Stopped Anastrozole in November 2022 due to hand pain. Annual mammograms, most recent on March 18, 2023, showed no mammographic evidence of malignancy. -Continue annual mammograms. -Breast exam today benign.  Chronic Pain Reports of burning pain, especially in legs, which patient attributes to previous Anastrozole use. Pain has not resolved despite discontinuation of Anastrozole. Pain is affecting sleep and daily activities. -Lab  testing today to evaluate for alternative etiology of pain -Consider referral to neurology for further evaluation of persistent burning pain.  Osteoporosis Bone density testing showed significant bone loss. Patient has been advised to consider Prolia, but is hesitant due to potential for increased pain. -Follow up with endocrinology as directed -Continue to  monitor and discuss potential treatments, taking into account patient's pain tolerance and quality of life.  Oral Lesion Patient reports a growth on her tongue, not painful. -Recommend evaluation by her dentist for potential removal or referral to a specialist.  General Health Maintenance -Continue Vitamin B12 and Vitamin D supplementation.  RTC in 1 year for continued long term follow-up.    All questions were answered. The patient knows to call the clinic with any problems, questions or concerns. We can certainly see the patient much sooner if necessary.  Total encounter time:30 minutes*in face-to-face visit time, chart review, lab review, care coordination, order entry, and documentation of the encounter time.    Lillard Anes, NP 04/16/23 11:58 AM Medical Oncology and Hematology [facility] [address] [place], [place] [address] Tel. [phone]    Fax. [phone]  *Total Encounter Time as defined by the Centers for Medicare and Medicaid Services includes, in addition to the face-to-face time of a patient visit (documented in the note above) non-face-to-face time: obtaining and reviewing outside history, ordering and reviewing medications, tests or procedures, care coordination (communications with other health care professionals or caregivers) and documentation in the medical record.

## 2023-04-16 NOTE — Assessment & Plan Note (Signed)
04/25/2019:Routine screening mammogram detected a 1.2cm mass in the right breast. Biopsy showed invasive mammary carcinoma with mammary carcinoma in situ, HER-2 - (1+), ER+ 100%, PR+ 5% Ki67 2%.  T1c N0 stage Ia clinical stage   Recommendations: 1.  Right lumpectomy 04/07/2019: Grade 2 IDC 1.3 cm with intermediate grade DCIS, margins negative, negative for lymphovascular or perineural invasion, ER 100%, PR 5%, HER-2 -1+, Ki-67 2%, T1c Nx stage Ia 2. did not recommend adjuvant radiation therapy  3. Adjuvant antiestrogen therapy with anastrozole 04/2019-04/2021 stopped secondary to hand pain  History of Breast Cancer On observation alone. Stopped Anastrozole in November 2022 due to hand pain. Annual mammograms, most recent on March 18, 2023, showed no mammographic evidence of malignancy. -Continue annual mammograms. -Breast exam today benign.  Chronic Pain Reports of burning pain, especially in legs, which patient attributes to previous Anastrozole use. Pain has not resolved despite discontinuation of Anastrozole. Pain is affecting sleep and daily activities. -Lab testing today to evaluate for alternative etiology of pain -Consider referral to neurology for further evaluation of persistent burning pain.  Osteoporosis Bone density testing showed significant bone loss. Patient has been advised to consider Prolia, but is hesitant due to potential for increased pain. -Follow up with endocrinology as directed -Continue to monitor and discuss potential treatments, taking into account patient's pain tolerance and quality of life.  Oral Lesion Patient reports a growth on her tongue, not painful. -Recommend evaluation by her dentist for potential removal or referral to a specialist.  General Health Maintenance -Continue Vitamin B12 and Vitamin D supplementation.  RTC in 1 year for continued long term follow-up.

## 2023-04-21 ENCOUNTER — Telehealth: Payer: Self-pay | Admitting: Family Medicine

## 2023-04-21 LAB — MULTIPLE MYELOMA PANEL, SERUM
Albumin SerPl Elph-Mcnc: 4.2 g/dL (ref 2.9–4.4)
Albumin/Glob SerPl: 1.5 (ref 0.7–1.7)
Alpha 1: 0.2 g/dL (ref 0.0–0.4)
Alpha2 Glob SerPl Elph-Mcnc: 0.7 g/dL (ref 0.4–1.0)
B-Globulin SerPl Elph-Mcnc: 1 g/dL (ref 0.7–1.3)
Gamma Glob SerPl Elph-Mcnc: 1 g/dL (ref 0.4–1.8)
Globulin, Total: 2.9 g/dL (ref 2.2–3.9)
IgA: 366 mg/dL (ref 64–422)
IgG (Immunoglobin G), Serum: 1154 mg/dL (ref 586–1602)
IgM (Immunoglobulin M), Srm: 68 mg/dL (ref 26–217)
Total Protein ELP: 7.1 g/dL (ref 6.0–8.5)

## 2023-04-21 NOTE — Telephone Encounter (Signed)
Current DPR doesn't list Britta Mccreedy

## 2023-04-21 NOTE — Telephone Encounter (Signed)
Pt's daughter, Britta Mccreedy, called stating the pt's behavior lately hasn't been pleasant. Britta Mccreedy states the pt is quick to get upset with others & goes off on everyone. Britta Mccreedy states the pt won't answer her phones for family nor will she communicate with family. Britta Mccreedy states she's worried about the pt's behavior due to the pt having a history of harming herself. Britta Mccreedy is requesting advise. Call back # [phone]

## 2023-04-21 NOTE — Telephone Encounter (Signed)
I looked under fyi and I think Britta Mccreedy was listed? In any case=needs to follow up for this  Thanks for the heads up  Would be optimal if family member comes with her

## 2023-04-22 NOTE — Telephone Encounter (Signed)
Called Tanda Rockers (daughter listed on DPR), I advised her of Dr. Royden Purl comments she said she will get with family and call back and schedule an appt when someone is able to come with her

## 2023-05-29 ENCOUNTER — Encounter (INDEPENDENT_AMBULATORY_CARE_PROVIDER_SITE_OTHER): Payer: Self-pay | Admitting: Otolaryngology

## 2023-05-29 ENCOUNTER — Ambulatory Visit (INDEPENDENT_AMBULATORY_CARE_PROVIDER_SITE_OTHER): Payer: Medicare HMO | Admitting: Otolaryngology

## 2023-05-29 VITALS — BP 160/73 | HR 62 | Wt 142.0 lb

## 2023-05-29 DIAGNOSIS — Z853 Personal history of malignant neoplasm of breast: Secondary | ICD-10-CM

## 2023-05-29 DIAGNOSIS — K148 Other diseases of tongue: Secondary | ICD-10-CM

## 2023-05-29 DIAGNOSIS — D3702 Neoplasm of uncertain behavior of tongue: Secondary | ICD-10-CM

## 2023-05-29 DIAGNOSIS — E042 Nontoxic multinodular goiter: Secondary | ICD-10-CM

## 2023-05-29 DIAGNOSIS — E041 Nontoxic single thyroid nodule: Secondary | ICD-10-CM

## 2023-05-29 NOTE — Progress Notes (Signed)
ENT CONSULT:  Reason for Consult: tongue lesion   HPI: Discussed the use of AI scribe software for clinical note transcription with the patient, who gave verbal consent to proceed.  History of Present Illness   The patient is an 89 yoF, with a history of breast cancer treated with lumpectomy and radiation in December 2020, presents with a tongue lesion noticed approximately a year ago. The lesion, which the patient discovered themselves, has remained stable in size and is not associated with pain. The patient denies any recent trauma to the area such as biting the tongue. They have a history of snuff use, but this was many years ago and they deny any heavy alcohol use. The patient wears upper dentures and has a few remaining lower teeth, which are not currently causing any discomfort. The patient has not had any biopsies of the tongue lesion. They also report occasional neck pain, but deny any lumps in the neck or difficulty swallowing. The patient is currently on a daily regimen of 81mg  aspirin, but denies use of any blood thinners.         Records Reviewed:  Oncology office note NP Cognetto 04/16/23 DIAGNOSIS:  Cancer Staging  Carcinoma of upper-outer quadrant of right breast in female, estrogen receptor positive (HCC) Staging form: Breast, AJCC 8th Edition - Clinical stage from 02/23/2019: cT1c, cN0, cM0, GX, ER+, PR+, HER2- - Signed by Lonie Peak, MD on 02/23/2019 Stage prefix: Initial diagnosis Histologic grading system: 3 grade system - Pathologic stage from 04/07/2019: Stage IA (pT1c, pN0, cM0, G2, ER+, PR+, HER2-) - Signed by Loa Socks, NP on 07/19/2019 Stage prefix: Initial diagnosis Histologic grading system: 3 grade system   Past Medical History:  Diagnosis Date   Allergy    CAD (coronary artery disease)    Chronic diastolic (congestive) heart failure (HCC)    Clinical trial participant    [facility] Genetic studies in familial dementia   Dyspnea 11/28/2016    Family history of breast cancer    Family history of lung cancer    Family history of prostate cancer    Frequent UTI    GERD (gastroesophageal reflux disease)    Hypertension    Osteoarthritis    hips, back   Osteopenia    Pneumonia    Spinal stenosis    Stroke (HCC) 1985    Past Surgical History:  Procedure Laterality Date   ABDOMINAL HYSTERECTOMY     partial has one ovary   APPENDECTOMY     BREAST LUMPECTOMY WITH RADIOACTIVE SEED LOCALIZATION Right 04/07/2019   Procedure: RIGHT BREAST LUMPECTOMY WITH RADIOACTIVE SEED LOCALIZATION;  Surgeon: Griselda Miner, MD;  Location: [facility] OR;  Service: General;  Laterality: Right;   CATARACT EXTRACTION Bilateral    LEFT HEART CATH AND CORONARY ANGIOGRAPHY N/A 11/29/2016   Procedure: Left Heart Cath and Coronary Angiography;  Surgeon: Tonny Bollman, MD;  Location: [facility] INVASIVE CV LAB;  Service: Cardiovascular;  Laterality: N/A;   LUMBAR DISC SURGERY     x 3   rotator cuff surgery Right 2010    Family History  Problem Relation Age of Onset   Lung cancer Brother 52   Other Mother        kidney tumor   Hypertension Mother    Diabetes Sister    Diabetes Brother    Diabetes Sister        x 2   Colon cancer Maternal Uncle    Other Sister  intestine burst   Breast cancer Maternal Aunt        x 2   Breast cancer Daughter        unknown cancer   Diabetes Son        x 3   Diabetes Daughter    Breast cancer Sister        x2, diagnosed in 45s and 31s   Prostate cancer Maternal Uncle    Esophageal cancer Neg Hx    Rectal cancer Neg Hx    Stomach cancer Neg Hx     Social History:  reports that she has never smoked. She has never used smokeless tobacco. She reports that she does not drink alcohol and does not use drugs.  Allergies:  Allergies  Allergen Reactions   Amoxicillin     Reaction not known   Atorvastatin     aching   Cefpodoxime Proxetil     Reaction not known   Clarithromycin     Reaction not known   Fosamax  [Alendronate Sodium]     Body pain    Furosemide     REACTION: doesn't help   Gabapentin Swelling    Caused severe swellling    Medications: I have reviewed the patient's current medications.  The PMH, PSH, Medications, Allergies, and SH were reviewed and updated.  ROS: Constitutional: Negative for fever, weight loss and weight gain. Cardiovascular: Negative for chest pain and dyspnea on exertion. Respiratory: Is not experiencing shortness of breath at rest. Gastrointestinal: Negative for nausea and vomiting. Neurological: Negative for headaches. Psychiatric: The patient is not nervous/anxious  Blood pressure (!) 160/73, pulse 62, weight 142 lb (64.4 kg), SpO2 98%.  PHYSICAL EXAM:  Exam: General: Well-developed, well-nourished Respiratory Respiratory effort: Equal inspiration and expiration without stridor Cardiovascular Peripheral Vascular: Warm extremities with equal color/perfusion Eyes: No nystagmus with equal extraocular motion bilaterally Neuro/Psych/Balance: Patient oriented to person, place, and time; Appropriate mood and affect; Gait is intact with no imbalance; Cranial nerves I-XII are intact Head and Face Inspection: Normocephalic and atraumatic without mass or lesion Palpation: Facial skeleton intact without bony stepoffs Salivary Glands: No mass or tenderness Facial Strength: Facial motility symmetric and full bilaterally ENT Pinna: External ear intact and fully developed External canal: Canal is patent with intact skin Tympanic Membrane: Clear and mobile External Nose: No scar or anatomic deformity Internal Nose: Septum is relatively straight on anterior rhinoscopy. No polyp, or purulence. Mucosal edema and erythema present.  Bilateral inferior turbinate hypertrophy.  Lips, Teeth, and gums: Mucosa viable, upper dentures in place TMJ: No pain to palpation with full mobility Oral cavity/oropharynx: No erythema or exudate, right lateral tongue with a papilloma  like growth, smooth and round no surrounding leuko- or erythroplakia  Neck Neck and Trachea: Midline trachea without mass or lesion Thyroid: No mass or nodularity Lymphatics: No lymphadenopathy  Procedure: none  Studies Reviewed:CXR 02/08/23 CLINICAL DATA:  Dyspnea, dizziness, short of breath   EXAM: CHEST  1 VIEW   COMPARISON:  11/16/2019   FINDINGS: Single frontal view of the chest demonstrates mild enlargement of the cardiac silhouette, which may be due to AP portable technique and positioning. No airspace disease, effusion, or pneumothorax. No acute bony abnormalities.   IMPRESSION: 1. Enlarged cardiac silhouette, likely projectional. 2. No acute airspace disease.  Thyroid U/S 02/25/22 CLINICAL DATA:  89 year old female with a history thyroid nodules   EXAM: THYROID ULTRASOUND   TECHNIQUE: Ultrasound examination of the thyroid gland and adjacent soft tissues was performed.  COMPARISON:  02/20/2021, prior biopsy 02/08/2020   FINDINGS: Parenchymal Echotexture: Moderately heterogenous   Isthmus: 0.2 cm   Right lobe: 4.6 cm x 2.1 cm x 2.3 cm   Left lobe: 4.5 cm x 1.2 cm   _________________________________________________________   Estimated total number of nodules >/= 1 cm: 1   Number of spongiform nodules >/=  2 cm not described below (TR1): 0   Number of mixed cystic and solid nodules >/= 1.5 cm not described below (TR2): 0   _________________________________________________________   Nodule labeled 1 in the right mid thyroid, 3.6 cm, decreased from 3.9 cm. Nodule has undergone prior biopsy. Assuming benign result, no further specific follow-up would be indicated.   Nodule labeled 2, inferior left thyroid, 7 mm. Nodule does not meet criteria for surveillance.   No adenopathy   Recommendations follow those established by the new ACR TI-RADS criteria (J Am Coll Radiol 2017;14:587-595).   IMPRESSION: Similar appearance of heterogeneous/multinodular  thyroid as above.  2021 FNA result   CYTOLOGY - NON PAP CASE: [id number] PATIENT: Sharita Memmer Non-Gynecological Cytology Report     Clinical History: Location: Right; Mid, Maximum Size: 4.3 cm; Other 2 dimensions: 3.1 x 2.0 cm, solid/almost completely solid (2), Isoechoic (1), ACR TI-RADS Total Points 3. Specimen Submitted:  A. THYROID, RIGHT LOBE, FINE NEEDLE ASPIRATION:   FINAL MICROSCOPIC DIAGNOSIS: - Consistent with benign follicular nodule (Bethesda category II)            Assessment/Plan: Encounter Diagnoses  Name Primary?   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 No pain, growth, or concerning features. History of chewing tobacco many years ago, no smoking or heavy alcohol use. No prior biopsies. Patient prefers excision based on oncologist's recommendation. Procedure will be in-office, under local anesthesia, with minimal risks. Tissue will be sent to pathology for definitive diagnosis. - Schedule in-office excision - Obtain insurance preapproval - Perform procedure early January - Send tissue to pathology  Breast Cancer Hx Breast cancer treated with lumpectomy and radiation in December 2020. Currently in remission with no evidence of recurrence. - Continue regular oncology follow-up  General Health Maintenance No significant dental issues. Wears upper dentures, few lower teeth remaining. No smoking or heavy alcohol use. No neck lumps or concerning symptoms. - Recommend regular dental check-ups and denture adjustments  Follow-up - schedule excision procedure in two weeks - Message surgery schedulers to arrange  procedure.     Thank you for allowing me to participate in the care of this patient. Please do not hesitate to contact me with any questions or concerns.   Ashok Croon, MD Otolaryngology [facility] Phone: [phone] Fax: [phone]    05/29/2023, 9:58 AM

## 2023-06-18 ENCOUNTER — Encounter (INDEPENDENT_AMBULATORY_CARE_PROVIDER_SITE_OTHER): Payer: Self-pay | Admitting: Otolaryngology

## 2023-06-18 ENCOUNTER — Other Ambulatory Visit (HOSPITAL_COMMUNITY)
Admission: RE | Admit: 2023-06-18 | Discharge: 2023-06-18 | Disposition: A | Discharge status: Home / Self Care | Payer: Medicare HMO | Source: Ambulatory Visit | Attending: Otolaryngology | Admitting: Otolaryngology

## 2023-06-18 ENCOUNTER — Ambulatory Visit (INDEPENDENT_AMBULATORY_CARE_PROVIDER_SITE_OTHER): Payer: Medicare HMO | Admitting: Otolaryngology

## 2023-06-18 VITALS — BP 163/77 | HR 56

## 2023-06-18 DIAGNOSIS — K148 Other diseases of tongue: Secondary | ICD-10-CM | POA: Diagnosis not present

## 2023-06-18 DIAGNOSIS — D3702 Neoplasm of uncertain behavior of tongue: Secondary | ICD-10-CM

## 2023-06-18 DIAGNOSIS — D1779 Benign lipomatous neoplasm of other sites: Secondary | ICD-10-CM | POA: Diagnosis not present

## 2023-06-18 MED ORDER — TRAMADOL HCL 50 MG PO TABS: 50.0000 mg | ORAL_TABLET | Freq: Four times a day (QID) | ORAL | 0 refills | Status: AC | PRN

## 2023-06-18 MED ORDER — CHLORHEXIDINE GLUCONATE 0.12 % MT SOLN: 15.0000 mL | Freq: Two times a day (BID) | OROMUCOSAL | 0 refills | Status: AC

## 2023-06-18 NOTE — Progress Notes (Signed)
 Procedure: Excision of oral tongue lesion without closure (58889)   ATTENDING: Elena Larry, M.D.   PREOPERATIVE DIAGNOSIS(ES): right tongue lesion  POSTOPERATIVE DIAGNOSIS(ES): Same  PROCEDURE PERFORMED: biopsy of the right tongue lesion   INDICATIONS FOR PROCEDURE: oral tongue lesion  The risks and benefits of the surgical procedure have been explained in detail to the patient and they have elected to proceed.  CONSENT:  Informed consent was obtained prior to the procedure after discussion of risks, benefits, and alternatives and expected outcomes were discussed with the patient; consent placed in chart. The possibilities of reaction to medication, bleeding, infection, inadequate biopsy, the need for additional procedures, failure to diagnose a condition, and creating a complication requiring transfusion or operation were discussed with the patient. The patient concurred with the proposed plan, giving informed consent.    UNIVERSAL PROTOCOL/ TIMEOUT: Preprocedure verification is complete- patient verified and consents confirmed.  H&P REVIEW: The patient's history and physical were reviewed today prior to procedure. All medications were reviewed and updated as well.  ANESTHESIA: local anesthesia with 1% lidocaine , 1:100,000 epinephrine   PROCEDURE DETAILS: The patient was brought to the clinic and placed in a seated position.  The mucosa over the lesion was anesthetized.  The lesion was dissected from surrounding structures and removed. The incision was made at the border of the lesion.  It was sent for permanent pathology. Hemostasis was obtained using Afrin soaked pledgets and Silver Nitrate. There were no complications and the patient tolerated the procedure well.  ESTIMATED BLOOD LOSS: Minimal  SPECIMEN(S) REMOVED: Right lateral tongue lesion  DISPOSITION OF SPECIMEN(S): Permanent pathology  FINDINGS:  No evidence of significant bleeding or other complication  CONDITION:  Stable  COMPLICATIONS:The patient tolerated the procedure well without apparent complications and was ambulatory.  NOTES: given rx for Peridex  mouthwash

## 2023-06-19 LAB — SURGICAL PATHOLOGY

## 2023-07-02 ENCOUNTER — Ambulatory Visit (INDEPENDENT_AMBULATORY_CARE_PROVIDER_SITE_OTHER): Payer: Medicare HMO | Admitting: Otolaryngology

## 2023-07-09 ENCOUNTER — Ambulatory Visit (INDEPENDENT_AMBULATORY_CARE_PROVIDER_SITE_OTHER): Payer: Medicare HMO | Admitting: Otolaryngology

## 2023-08-06 ENCOUNTER — Ambulatory Visit (INDEPENDENT_AMBULATORY_CARE_PROVIDER_SITE_OTHER)
Admission: RE | Admit: 2023-08-06 | Discharge: 2023-08-06 | Disposition: A | Discharge status: Home / Self Care | Payer: Medicare HMO | Source: Ambulatory Visit | Attending: Family Medicine | Admitting: Family Medicine

## 2023-08-06 ENCOUNTER — Encounter: Payer: Self-pay | Admitting: Family Medicine

## 2023-08-06 ENCOUNTER — Ambulatory Visit (INDEPENDENT_AMBULATORY_CARE_PROVIDER_SITE_OTHER): Payer: Medicare HMO | Admitting: Family Medicine

## 2023-08-06 VITALS — BP 136/74 | HR 63 | Temp 98.7°F | Ht 61.5 in | Wt 145.0 lb

## 2023-08-06 DIAGNOSIS — J069 Acute upper respiratory infection, unspecified: Secondary | ICD-10-CM

## 2023-08-06 DIAGNOSIS — R042 Hemoptysis: Secondary | ICD-10-CM | POA: Insufficient documentation

## 2023-08-06 DIAGNOSIS — I700 Atherosclerosis of aorta: Secondary | ICD-10-CM | POA: Diagnosis not present

## 2023-08-06 DIAGNOSIS — R058 Other specified cough: Secondary | ICD-10-CM | POA: Diagnosis not present

## 2023-08-06 DIAGNOSIS — L304 Erythema intertrigo: Secondary | ICD-10-CM

## 2023-08-06 MED ORDER — NYSTATIN 100000 UNIT/GM EX CREA: 1.0000 | TOPICAL_CREAM | Freq: Two times a day (BID) | CUTANEOUS | 0 refills | Status: AC | PRN

## 2023-08-06 MED ORDER — PREDNISONE 10 MG PO TABS: ORAL_TABLET | ORAL | 0 refills | Status: DC

## 2023-08-06 NOTE — Assessment & Plan Note (Addendum)
 Exacerbation today/ chronic in past  Mainly in right axilla and under right breast In past in groin  Discussed goal to keep clean and dry  Prescription nystatin cream to start bid Update if not starting to improve in a week or if worsening  Call back and Er precautions noted in detail today

## 2023-08-06 NOTE — Progress Notes (Signed)
 Subjective:    Patient ID: Sara [name], female    DOB: 10-Jan-1932, 89 y.o.   MRN: [id number]  HPI  Wt Readings from Last 3 Encounters:  08/06/23 145 lb (65.8 kg)  05/29/23 142 lb (64.4 kg)  04/16/23 145 lb 9.6 oz (66 kg)   26.95 kg/m  Vitals:   08/06/23 1141  BP: 136/74  Pulse: 63  Temp: 98.7 F (37.1 C)  SpO2: 98%    Pt presents with uri symptoms for over a week and rash in r axilla   Cough  Productive yellow  Some blood in sputum  A little wheezing if she exerts herself  Tired  Some sore throat Nasal congestion  Ears are full/harder to hear   No fever  Is normal to feel cold   No n/v/d     Rash in right armpit and under r breast  Red Itchy  Tries not to scratch No swelling or drainage    Over the counter for cold symptoms  Tussin  Tylenol   Cxr today DG Chest 2 View Result Date: 08/06/2023 CLINICAL DATA:  Productive cough and hemoptysis. EXAM: CHEST - 2 VIEW COMPARISON:  Chest radiograph dated 02/08/2023. FINDINGS: No focal consolidation, pleural effusion, pneumothorax. The cardiac silhouette is within normal limits. Atherosclerotic calcification of the aorta. Degenerative changes of spine. No acute osseous pathology. IMPRESSION: No active cardiopulmonary disease. Electronically Signed   By: Elgie Collard M.D.   On: 08/06/2023 13:36       Patient Active Problem List   Diagnosis Date Noted   Hemoptysis 08/06/2023   URI with cough and congestion 08/06/2023   Tongue lesion 02/11/2023   Allergic rhinitis 02/11/2023   Skin lump of arm, right 02/12/2022   Aortic root enlargement (HCC) 01/09/2021   Neck pain 01/18/2020   Left shoulder pain 01/18/2020   Pain in both upper arms 01/18/2020   Thyroid nodule 01/04/2020   Flank pain 11/22/2019   Hand pain, right 10/18/2019   Family history of breast cancer    Family history of prostate cancer    Family history of lung cancer    Carcinoma of upper-outer quadrant of right breast in  female, estrogen receptor positive (HCC) 02/23/2019   Pre-operative clearance 02/21/2019   History of breast cancer 02/21/2019   Medicare annual wellness visit, subsequent 01/05/2019   Encounter for screening mammogram for breast cancer 01/05/2019   Knee pain, left 10/14/2018   Arthritis of left hip 07/29/2018   Bradycardia 07/01/2018   Left ear pain 07/01/2018   Chronic diastolic heart failure (HCC) 12/27/2017   Lumbar spinal stenosis 03/11/2017   Diastolic dysfunction 01/14/2017   Estrogen deficiency 12/16/2016   Routine general medical examination at a health care facility 12/16/2016   CAD (coronary artery disease) 12/04/2016   Intertrigo 03/15/2016   Mucocele, 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   Eczema 03/06/2015   Prediabetes 02/11/2014   CONSTIPATION, CHRONIC 07/03/2010   Osteoporosis 06/24/2008   HYPERCHOLESTEROLEMIA 12/08/2006   Essential hypertension 12/08/2006   GERD 12/08/2006   H/O herpes zoster 12/08/2006   Past Medical History:  Diagnosis Date   Allergy    CAD (coronary artery disease)    Chronic diastolic (congestive) heart failure (HCC)    Clinical trial participant    [facility] Genetic studies in familial dementia   Dyspnea 11/28/2016   Family history of breast cancer    Family history of lung cancer    Family history of prostate cancer    Frequent UTI  GERD (gastroesophageal reflux disease)    Hypertension    Osteoarthritis    hips, back   Osteopenia    Pneumonia    Spinal stenosis    Stroke (HCC) 1985   Past Surgical History:  Procedure Laterality Date   ABDOMINAL HYSTERECTOMY     partial has one ovary   APPENDECTOMY     BREAST LUMPECTOMY WITH RADIOACTIVE SEED LOCALIZATION Right 04/07/2019   Procedure: RIGHT BREAST LUMPECTOMY WITH RADIOACTIVE SEED LOCALIZATION;  Surgeon: Griselda Miner, MD;  Location: [facility] OR;  Service: General;  Laterality: Right;   CATARACT EXTRACTION Bilateral    LEFT HEART CATH AND CORONARY ANGIOGRAPHY N/A 11/29/2016   Procedure: Left  Heart Cath and Coronary Angiography;  Surgeon: Tonny Bollman, MD;  Location: [facility] INVASIVE CV LAB;  Service: Cardiovascular;  Laterality: N/A;   LUMBAR DISC SURGERY     x 3   rotator cuff surgery Right 2010   Social History   Tobacco Use   Smoking status: Never   Smokeless tobacco: Never  Vaping Use   Vaping status: Never Used  Substance Use Topics   Alcohol use: No    Alcohol/week: 0.0 standard drinks of alcohol   Drug use: No   Family History  Problem Relation Age of Onset   Lung cancer Brother 45   [name] Mother        kidney tumor   Hypertension Mother    Diabetes Sister    Diabetes Brother    Diabetes Sister        x 2   Colon cancer Maternal Uncle    [name] Sister        intestine burst   Breast cancer Maternal Aunt        x 2   Breast cancer Daughter        unknown cancer   Diabetes Son        x 3   Diabetes Daughter    Breast cancer Sister        x2, diagnosed in 47s and 72s   Prostate cancer Maternal Uncle    Esophageal cancer Neg Hx    Rectal cancer Neg Hx    Stomach cancer Neg Hx    Allergies  Allergen Reactions   Amoxicillin     Reaction not known   Atorvastatin     aching   Cefpodoxime Proxetil     Reaction not known   Clarithromycin     Reaction not known   Fosamax [Alendronate Sodium]     Body pain    Furosemide     REACTION: doesn't help   Gabapentin Swelling    Caused severe swellling   Current Outpatient Medications on File Prior to Visit  Medication Sig Dispense Refill   acetaminophen (TYLENOL) 500 MG tablet Take 1,000 mg by mouth every 6 (six) hours as needed.     aspirin EC 81 MG tablet Take 81 mg by mouth daily.       Cholecalciferol (VITAMIN D-3) 1000 UNITS CAPS Take 1,000 Units by mouth daily.      diltiazem (CARDIZEM CD) 120 MG 24 hr capsule TAKE 1 CAPSULE EVERY DAY 90 capsule 3   doxylamine, Sleep, (UNISOM) 25 MG tablet Take 25 mg by mouth at bedtime as needed for sleep.      hydrochlorothiazide (HYDRODIURIL) 25 MG tablet  TAKE 1 TABLET EVERY DAY 90 tablet 3   nitroGLYCERIN (NITROSTAT) 0.4 MG SL tablet Place 1 tablet (0.4 mg total) under the tongue every 5 (five)  minutes as needed for chest pain (max 3 doses in 15 minutes). 25 tablet 3   polyethylene glycol (MIRALAX / GLYCOLAX) packet Take 17 g by mouth daily.      potassium chloride (KLOR-CON) 10 MEQ tablet TAKE 1 TABLET EVERY DAY 90 tablet 3   vitamin B-12 (CYANOCOBALAMIN) 1000 MCG tablet Take 1,000 mcg by mouth daily.     No current facility-administered medications on file prior to visit.    Review of Systems  Constitutional:  Positive for appetite change and fatigue. Negative for fever.  HENT:  Positive for congestion, postnasal drip, rhinorrhea, sinus pressure, sneezing and sore throat. Negative for ear pain.   Eyes:  Negative for pain and discharge.  Respiratory:  Positive for cough. Negative for shortness of breath, wheezing and stridor.   Cardiovascular:  Negative for chest pain.       Mild swelling in ankles a bit worse lately   Gastrointestinal:  Negative for diarrhea, nausea and vomiting.  Genitourinary:  Negative for frequency, hematuria and urgency.  Musculoskeletal:  Negative for arthralgias and myalgias.  Skin:  Positive for rash.  Neurological:  Negative for dizziness, weakness, light-headedness and headaches.  Psychiatric/Behavioral:  Negative for confusion and dysphoric mood.        Objective:   Physical Exam Constitutional:      General: She is not in acute distress.    Appearance: Normal appearance. She is well-developed and normal weight. She is not ill-appearing, toxic-appearing or diaphoretic.  HENT:     Head: Normocephalic and atraumatic.     Comments: Nares are injected and congested      Right Ear: Tympanic membrane, ear canal and external ear normal.     Left Ear: Tympanic membrane, ear canal and external ear normal.     Ears:     Comments: Scant cerumen bilat    Nose: Congestion and rhinorrhea present.      Mouth/Throat:     Mouth: Mucous membranes are moist.     Pharynx: Oropharynx is clear. No oropharyngeal exudate or posterior oropharyngeal erythema.     Comments: Clear pnd  Eyes:     General:        Right eye: No discharge.        Left eye: No discharge.     Conjunctiva/sclera: Conjunctivae normal.     Pupils: Pupils are equal, round, and reactive to light.  Cardiovascular:     Rate and Rhythm: Normal rate.     Heart sounds: Normal heart sounds.  Pulmonary:     Effort: Pulmonary effort is normal. No respiratory distress.     Breath sounds: No stridor. Rhonchi present. No wheezing or rales.     Comments: Scattered rhonchi  Scant wheeze only on prolonged expiration   No rales or crackles  Cough is harsh   Some upper airway sounds  Chest:     Chest wall: No tenderness.  Musculoskeletal:     Cervical back: Normal range of motion and neck supple.     Comments: Right ankle is larger than left Not tender No pitting edema   Lymphadenopathy:     Cervical: No cervical adenopathy.  Skin:    General: Skin is warm and dry.     Capillary Refill: Capillary refill takes less than 2 seconds.     Findings: Rash present.     Comments: Erythema-patchy with scalloped border in r axilla and under right breast   Neurological:     Mental Status: She is alert.  Cranial Nerves: No cranial nerve deficit.  Psychiatric:        Mood and Affect: Mood normal.           Assessment & Plan:   Problem List Items Addressed This Visit       Respiratory   URI with cough and congestion - Primary   For over a week Malaise but no fever Worsened cough with yellow phlegm and now some blood  Reassuring exam / rhonchi  Suspect bronchitis Cxr now is reassuring -no infiltrate  Will treat for bronchitis with course of prednisone (discussed side effects)  Disc symptomatic care - see instructions on AVS Call back and Er precautions noted in detail today         Relevant Medications   nystatin  cream (MYCOSTATIN)   [name] Relevant Orders   DG Chest 2 View (Completed)     Musculoskeletal and Integument   Intertrigo   Exacerbation today/ chronic in past  Mainly in right axilla and under right breast In past in groin  Discussed goal to keep clean and dry  Prescription nystatin cream to start bid Update if not starting to improve in a week or if worsening  Call back and Er precautions noted in detail today          [name]   Hemoptysis   With hard cough from uri  Cxr ordered -reassuring /clear  Encouraged pt to call if this does not improve with treatment of bronchitis       Relevant Orders   DG Chest 2 View (Completed)

## 2023-08-06 NOTE — Assessment & Plan Note (Addendum)
 For over a week Malaise but no fever Worsened cough with yellow phlegm and now some blood  Reassuring exam / rhonchi  Suspect bronchitis Cxr now is reassuring -no infiltrate  Will treat for bronchitis with course of prednisone (discussed side effects)  Disc symptomatic care - see instructions on AVS Call back and Er precautions noted in detail today

## 2023-08-06 NOTE — Patient Instructions (Addendum)
 I think you have bronchitis , but want to make sure there is no pneumonia  Chest xray today   We will call you later today with the xray result and decide what to treat you with   Drink fluids and rest  Tussin you are taking is  good for cough and congestion  Nasal saline for congestion as needed  Tylenol for fever or pain or headache  Please alert [place] if symptoms worsen (if severe or short of breath please go to the ER)   I think you may have a yeast rash under your arm and breast  I sent in nystatin cream to try  Try to keep the area clean and dry  Update if not starting to improve in a week or if worsening

## 2023-08-06 NOTE — Assessment & Plan Note (Addendum)
 With hard cough from uri  Cxr ordered -reassuring /clear  Encouraged pt to call if this does not improve with treatment of bronchitis

## 2023-08-26 ENCOUNTER — Encounter: Payer: Self-pay | Admitting: Family Medicine

## 2023-08-26 ENCOUNTER — Ambulatory Visit (INDEPENDENT_AMBULATORY_CARE_PROVIDER_SITE_OTHER): Admitting: Family Medicine

## 2023-08-26 VITALS — BP 138/75 | HR 54 | Temp 98.7°F | Ht 61.5 in | Wt 144.2 lb

## 2023-08-26 DIAGNOSIS — M48061 Spinal stenosis, lumbar region without neurogenic claudication: Secondary | ICD-10-CM | POA: Diagnosis not present

## 2023-08-26 DIAGNOSIS — M79604 Pain in right leg: Secondary | ICD-10-CM | POA: Insufficient documentation

## 2023-08-26 DIAGNOSIS — H6123 Impacted cerumen, bilateral: Secondary | ICD-10-CM

## 2023-08-26 NOTE — Patient Instructions (Addendum)
 Voltaren gel is ok to use - you can try it on the outside of your hip as well as the leg  Same with ice- the outside of hip as well as leg   Tylenol is ok   Try not to sleep on the right side   I put the referral in for orthopedics  Please let us know if you don't hear in 1-2 weeks   You have wax in both ears  Get Debrox ear solution over the counter - 2-3 times per week (as directed)  It will help loosen wax so we can flush it out later   Follow up here in 2-3 weeks for an ear irrigation (flush)

## 2023-08-26 NOTE — Assessment & Plan Note (Signed)
 Bilateral Dry  Causing some discomfort  Recommend use of debrox over the counter 2-3 times weekly  Then follow up here 2-3 weeks for visit and attempt irrigation

## 2023-08-26 NOTE — Assessment & Plan Note (Addendum)
 This may be adding to right leg pain  Reviewed old ortho notes/operative  Dr Otelia Sergeant in the past

## 2023-08-26 NOTE — Progress Notes (Signed)
 Subjective:    Patient ID: Sara [name], female    DOB: 10-03-1931, 89 y.o.   MRN: [id number]  HPI  Wt Readings from Last 3 Encounters:  08/26/23 144 lb 4 oz (65.4 kg)  08/06/23 145 lb (65.8 kg)  05/29/23 142 lb (64.4 kg)   26.81 kg/m  Vitals:   08/26/23 1520 08/26/23 1601  BP: (!) 148/84 138/75  Pulse: (!) 54   Temp: 98.7 F (37.1 C)   SpO2: 97%     Pt presents for acute on chronic leg pain  Some pain in ear/ right side also -intermittent   Pain in her right leg  Years  Getting worse  Knee bone looks more prominent - stopped anastrozole in fall 2022  Keeping her up at night  Below knee  Can radiate up and down the whole leg and back   Not numb or tingling   Ice is helpful  Has not tried heat   Has tried voltaren gel   Triggered by lying on that side     Feels like the right side is weaker than the left     History of LS degeneration with neurogenic claudication    saw Dr Otelia Sergeant Gaylord Shih in past  Has had fusion LS , laminectomy, diskectomy  History of knee pain -has seen Dr Magnus Ivan and PA Richardean Canal   PT in 2023  Patient Active Problem List   Diagnosis Date Noted   Right leg pain 08/26/2023   Hemoptysis 08/06/2023   URI with cough and congestion 08/06/2023   Tongue lesion 02/11/2023   Allergic rhinitis 02/11/2023   Skin lump of arm, right 02/12/2022   Cerumen impaction 09/12/2021   Aortic root enlargement (HCC) 01/09/2021   Neck pain 01/18/2020   Left shoulder pain 01/18/2020   Pain in both upper arms 01/18/2020   Thyroid nodule 01/04/2020   Flank pain 11/22/2019   Hand pain, right 10/18/2019   Family history of breast cancer    Family history of prostate cancer    Family history of lung cancer    Carcinoma of upper-outer quadrant of right breast in female, estrogen receptor positive (HCC) 02/23/2019   Pre-operative clearance 02/21/2019   History of breast cancer 02/21/2019   Medicare annual wellness visit, subsequent  01/05/2019   Encounter for screening mammogram for breast cancer 01/05/2019   Knee pain, left 10/14/2018   Arthritis of left hip 07/29/2018   Bradycardia 07/01/2018   Left ear pain 07/01/2018   Chronic diastolic heart failure (HCC) 12/27/2017   Lumbar spinal stenosis 03/11/2017   Diastolic dysfunction 01/14/2017   Estrogen deficiency 12/16/2016   Routine general medical examination at a health care facility 12/16/2016   CAD (coronary artery disease) 12/04/2016   Intertrigo 03/15/2016   Mucocele, 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   Eczema 03/06/2015   Prediabetes 02/11/2014   CONSTIPATION, CHRONIC 07/03/2010   Osteoporosis 06/24/2008   HYPERCHOLESTEROLEMIA 12/08/2006   Essential hypertension 12/08/2006   GERD 12/08/2006   H/O herpes zoster 12/08/2006   Past Medical History:  Diagnosis Date   Allergy    CAD (coronary artery disease)    Chronic diastolic (congestive) heart failure (HCC)    Clinical trial participant    [facility] Genetic studies in familial dementia   Dyspnea 11/28/2016   Family history of breast cancer    Family history of lung cancer    Family history of prostate cancer    Frequent UTI    GERD (gastroesophageal reflux disease)    Hypertension  Osteoarthritis    hips, back   Osteopenia    Pneumonia    Spinal stenosis    Stroke [facility]) 1985   Past Surgical History:  Procedure Laterality Date   ABDOMINAL HYSTERECTOMY     partial has one ovary   APPENDECTOMY     BREAST LUMPECTOMY WITH RADIOACTIVE SEED LOCALIZATION Right 04/07/2019   Procedure: RIGHT BREAST LUMPECTOMY WITH RADIOACTIVE SEED LOCALIZATION;  Surgeon: Griselda Miner, MD;  Location: [facility] OR;  Service: General;  Laterality: Right;   CATARACT EXTRACTION Bilateral    LEFT HEART CATH AND CORONARY ANGIOGRAPHY N/A 11/29/2016   Procedure: Left Heart Cath and Coronary Angiography;  Surgeon: Tonny Bollman, MD;  Location: [facility] INVASIVE CV LAB;  Service: Cardiovascular;  Laterality: N/A;   LUMBAR DISC SURGERY     x 3    rotator cuff surgery Right 2010   Social History   Tobacco Use   Smoking status: Never   Smokeless tobacco: Never  Vaping Use   Vaping status: Never Used  Substance Use Topics   Alcohol use: No    Alcohol/week: 0.0 standard drinks of alcohol   Drug use: No   Family History  Problem Relation Age of Onset   Lung cancer Brother 60   [name] Mother        kidney tumor   Hypertension Mother    Diabetes Sister    Diabetes Brother    Diabetes Sister        x 2   Colon cancer Maternal Uncle    [name] Sister        intestine burst   Breast cancer Maternal Aunt        x 2   Breast cancer Daughter        unknown cancer   Diabetes Son        x 3   Diabetes Daughter    Breast cancer Sister        x2, diagnosed in 56s and 68s   Prostate cancer Maternal Uncle    Esophageal cancer Neg Hx    Rectal cancer Neg Hx    Stomach cancer Neg Hx    Allergies  Allergen Reactions   Amoxicillin     Reaction not known   Atorvastatin     aching   Cefpodoxime Proxetil     Reaction not known   Clarithromycin     Reaction not known   Fosamax [Alendronate Sodium]     Body pain    Furosemide     REACTION: doesn't help   Gabapentin Swelling    Caused severe swellling   Current Outpatient Medications on File Prior to Visit  Medication Sig Dispense Refill   acetaminophen (TYLENOL) 500 MG tablet Take 1,000 mg by mouth every 6 (six) hours as needed.     aspirin EC 81 MG tablet Take 81 mg by mouth daily.       Cholecalciferol (VITAMIN D-3) 1000 UNITS CAPS Take 1,000 Units by mouth daily.      diltiazem (CARDIZEM CD) 120 MG 24 hr capsule TAKE 1 CAPSULE EVERY DAY 90 capsule 3   doxylamine, Sleep, (UNISOM) 25 MG tablet Take 25 mg by mouth at bedtime as needed for sleep.      hydrochlorothiazide (HYDRODIURIL) 25 MG tablet TAKE 1 TABLET EVERY DAY 90 tablet 3   nitroGLYCERIN (NITROSTAT) 0.4 MG SL tablet Place 1 tablet (0.4 mg total) under the tongue every 5 (five) minutes as needed for chest pain  (max 3 doses in 15  minutes). 25 tablet 3   nystatin cream (MYCOSTATIN) Apply 1 Application topically 2 (two) times daily as needed for dry skin. To affected itchy areas 30 g 0   polyethylene glycol (MIRALAX / GLYCOLAX) packet Take 17 g by mouth daily.      potassium chloride (KLOR-CON) 10 MEQ tablet TAKE 1 TABLET EVERY DAY 90 tablet 3   vitamin B-12 (CYANOCOBALAMIN) 1000 MCG tablet Take 1,000 mcg by mouth daily.     No current facility-administered medications on file prior to visit.    Review of Systems  Constitutional:  Positive for fatigue. Negative for activity change, appetite change, fever and unexpected weight change.  HENT:  Positive for ear pain and hearing loss. Negative for congestion, facial swelling, rhinorrhea, sinus pressure and sore throat.        Ears feel full  Occational pain   Eyes:  Negative for pain, redness and visual disturbance.  Respiratory:  Negative for cough, shortness of breath and wheezing.   Cardiovascular:  Negative for chest pain and palpitations.  Gastrointestinal:  Negative for abdominal pain, blood in stool, constipation and diarrhea.  Endocrine: Negative for polydipsia and polyuria.  Genitourinary:  Negative for dysuria, frequency and urgency.  Musculoskeletal:  Positive for arthralgias, back pain and myalgias.  Skin:  Negative for pallor and rash.  Allergic/Immunologic: Negative for environmental allergies.  Neurological:  Negative for dizziness, syncope and headaches.  Hematological:  Negative for adenopathy. Does not bruise/bleed easily.  Psychiatric/Behavioral:  Negative for decreased concentration and dysphoric mood. The patient is not nervous/anxious.        Objective:   Physical Exam Constitutional:      General: She is not in acute distress.    Appearance: Normal appearance. She is well-developed and normal weight. She is not ill-appearing or diaphoretic.  HENT:     Head: Normocephalic and atraumatic.     Right Ear: There is impacted  cerumen.     Left Ear: There is impacted cerumen.     Ears:     Comments: Dry cerumen bilaterally  Eyes:     General:        Right eye: No discharge.        Left eye: No discharge.     Conjunctiva/sclera: Conjunctivae normal.     Pupils: Pupils are equal, round, and reactive to light.  Neck:     Thyroid: No thyromegaly.     Vascular: No carotid bruit or JVD.  Cardiovascular:     Rate and Rhythm: Normal rate and regular rhythm.     Heart sounds: Normal heart sounds.     No gallop.  Pulmonary:     Effort: Pulmonary effort is normal. No respiratory distress.     Breath sounds: Normal breath sounds. No wheezing or rales.  Abdominal:     General: There is no distension or abdominal bruit.     Palpations: Abdomen is soft.  Musculoskeletal:     Cervical back: Normal range of motion and neck supple.     Right lower leg: No edema.     Left lower leg: No edema.     Comments: LS-pain with flex 30 deg and ext 5 deg (limited)  Normal lateral bend  No spinous tenderness / scar noted  No SI joint tenderness  Right greater trochanter is tender  No tenderness in knee or in area of pain of lateral calf/shin  No rash  Some mild knee deformity bilaterally  Normal perfusion of LEs  Gait favors LLE  Lymphadenopathy:     Cervical: No cervical adenopathy.  Skin:    General: Skin is warm and dry.     Coloration: Skin is not pale.     Findings: No rash.  Neurological:     Mental Status: She is alert.     Sensory: No sensory deficit.     Motor: No weakness.     Coordination: Coordination normal.     Deep Tendon Reflexes: Reflexes are normal and symmetric. Reflexes normal.  Psychiatric:        Mood and Affect: Mood normal.           Assessment & Plan:   Problem List Items Addressed This Visit       Nervous and Auditory   Cerumen impaction   Bilateral Dry  Causing some discomfort  Recommend use of debrox over the counter 2-3 times weekly  Then follow up here 2-3 weeks for  visit and attempt irrigation          [name]   Right leg pain - Primary   From greater trochanter to ankle Worse over lateral calf  Worsened by lying on right side and keeping her from sleeping   May be radicular - with history of deg disk in LS and several surgeries Also possible trochanteric bursitis- some tenderness on exam   Referral to ortho done  Disc symptomatic care - see instructions on AVS Will try moving the voltaren gel and cool compress up to lateral hip area as well  30  Minutes were spent today both face to face and in the chart obtaining history, reviewing records (ortho and operative)  and test results, performing exam , educating and discussing treatment options, and placing orders           Relevant Orders   Ambulatory referral to Orthopedic Surgery   Lumbar spinal stenosis   This may be adding to right leg pain  Reviewed old ortho notes/operative  Dr Otelia Sergeant in the past         Relevant Orders   Ambulatory referral to Orthopedic Surgery

## 2023-08-26 NOTE — Assessment & Plan Note (Addendum)
 From greater trochanter to ankle Worse over lateral calf  Worsened by lying on right side and keeping her from sleeping   May be radicular - with history of deg disk in LS and several surgeries Also possible trochanteric bursitis- some tenderness on exam   Referral to ortho done  Disc symptomatic care - see instructions on AVS Will try moving the voltaren gel and cool compress up to lateral hip area as well  30  Minutes were spent today both face to face and in the chart obtaining history, reviewing records (ortho and operative)  and test results, performing exam , educating and discussing treatment options, and placing orders

## 2023-09-08 ENCOUNTER — Other Ambulatory Visit (INDEPENDENT_AMBULATORY_CARE_PROVIDER_SITE_OTHER): Payer: Self-pay

## 2023-09-08 ENCOUNTER — Other Ambulatory Visit: Payer: Self-pay | Admitting: Family Medicine

## 2023-09-08 ENCOUNTER — Ambulatory Visit: Admitting: Physician Assistant

## 2023-09-08 DIAGNOSIS — G8929 Other chronic pain: Secondary | ICD-10-CM

## 2023-09-08 DIAGNOSIS — M25551 Pain in right hip: Secondary | ICD-10-CM

## 2023-09-08 DIAGNOSIS — M5441 Lumbago with sciatica, right side: Secondary | ICD-10-CM | POA: Diagnosis not present

## 2023-09-08 NOTE — Progress Notes (Addendum)
 HPI: Sara [name] comes in today for right hip pain along with low back pain.  She has been seen by Dr. Richardo Chandler and Dr. Denney Fisherman in the past for her low back.  Has had 3 surgeries on her lumbar spine including 1 done by Dr. Richardo Chandler.  She states she has had pain in her low back and right hip region for over a year now no known injury.  Heaviness in the right leg.  Pain does radiate down to her ankle at times.  She describes a tingling sensation down the leg somewhat burning.  She uses a cane to ambulate.  She has pain when laying on the right hip at night.  States the pain down the leg does not awaken her.  Denies any saddle anesthesia like symptoms weight change changes fevers or chills.  She feels the pain is getting worse.  She cannot stand for long periods of time due to the pain down the leg.  Review of systems: See HPI otherwise negative  Physical exam: General Well-developed well-nourished female no acute distress mood and affect appropriate.  Psych alert and oriented x 3 Lower extremities: 5 out of 5 strength throughout lower extremities negative straight leg raise bilaterally.  She has full forward flexion is able to touch her toes.  She has limited extension and some discomfort with extension lumbar spine. Bilateral hips: Good range of motion without pain.  Tenderness over the trochanteric region bilateral hips.  Radiographs: AP pelvis and lateral view right hip: Bilateral hips well located.  No acute fractures acute findings.  Both hips appear well-preserved.  Surgical clips over the pubic symphysis area.  Also retained hardware in the lower lumbar spine.  Lumbar spine 2 views: No acute fractures.Status post L4-5 fusion with no hardware failure.  Slight retrolisthesis at L2-3.  L3-4 L1-2 disc space narrowing.  No acute findings   Impression: Low back pain with radicular symptoms right leg  Plan: Recommend MRI of the lumbar spine to evaluate radiculopathy rule out spinal stenosis.  Questions were  encouraged and answered at length.  Will have her follow-up after the MRI to go over results discuss further treatment.

## 2023-09-10 ENCOUNTER — Other Ambulatory Visit: Payer: Self-pay | Admitting: Radiology

## 2023-09-10 DIAGNOSIS — G8929 Other chronic pain: Secondary | ICD-10-CM

## 2023-09-16 ENCOUNTER — Ambulatory Visit (INDEPENDENT_AMBULATORY_CARE_PROVIDER_SITE_OTHER): Admitting: Family Medicine

## 2023-09-16 ENCOUNTER — Encounter: Payer: Self-pay | Admitting: Family Medicine

## 2023-09-16 VITALS — BP 139/72 | HR 52 | Temp 98.2°F | Ht 61.5 in | Wt 145.2 lb

## 2023-09-16 DIAGNOSIS — H6123 Impacted cerumen, bilateral: Secondary | ICD-10-CM | POA: Diagnosis not present

## 2023-09-16 NOTE — Patient Instructions (Addendum)
 Ears look so much better  In the next few days , hearing should improve more as the water drains out   If you get worse or have any ear pain let us know   Be careful if you feel off balance

## 2023-09-16 NOTE — Progress Notes (Signed)
 Subjective:    Patient ID: Sara [name], female    DOB: 1932/03/16, 89 y.o.   MRN: [id number]  HPI  Wt Readings from Last 3 Encounters:  09/16/23 145 lb 4 oz (65.9 kg)  08/26/23 144 lb 4 oz (65.4 kg)  08/06/23 145 lb (65.8 kg)   27.00 kg/m  Vitals:   09/16/23 1522 09/16/23 1602  BP: (!) 144/78 139/72  Pulse: (!) 52   Temp: 98.2 F (36.8 C)   SpO2: 96%    Pt presents for follow up  Cerumen impaction   Used drops as advised  Thinks she can hear a little better    Patient Active Problem List   Diagnosis Date Noted   Right leg pain 08/26/2023   Hemoptysis 08/06/2023   URI with cough and congestion 08/06/2023   Tongue lesion 02/11/2023   Allergic rhinitis 02/11/2023   Skin lump of arm, right 02/12/2022   Cerumen impaction 09/12/2021   Aortic root enlargement (HCC) 01/09/2021   Neck pain 01/18/2020   Left shoulder pain 01/18/2020   Pain in both upper arms 01/18/2020   Thyroid nodule 01/04/2020   Flank pain 11/22/2019   Hand pain, right 10/18/2019   Family history of breast cancer    Family history of prostate cancer    Family history of lung cancer    Carcinoma of upper-outer quadrant of right breast in female, estrogen receptor positive (HCC) 02/23/2019   Pre-operative clearance 02/21/2019   History of breast cancer 02/21/2019   Medicare annual wellness visit, subsequent 01/05/2019   Encounter for screening mammogram for breast cancer 01/05/2019   Knee pain, left 10/14/2018   Arthritis of left hip 07/29/2018   Bradycardia 07/01/2018   Left ear pain 07/01/2018   Chronic diastolic heart failure (HCC) 12/27/2017   Lumbar spinal stenosis 03/11/2017   Diastolic dysfunction 01/14/2017   Estrogen deficiency 12/16/2016   Routine general medical examination at a health care facility 12/16/2016   CAD (coronary artery disease) 12/04/2016   Intertrigo 03/15/2016   Mucocele, 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   Eczema 03/06/2015   Prediabetes 02/11/2014   CONSTIPATION,  CHRONIC 07/03/2010   Osteoporosis 06/24/2008   HYPERCHOLESTEROLEMIA 12/08/2006   Essential hypertension 12/08/2006   GERD 12/08/2006   H/O herpes zoster 12/08/2006   Past Medical History:  Diagnosis Date   Allergy    CAD (coronary artery disease)    Chronic diastolic (congestive) heart failure (HCC)    Clinical trial participant    [facility] Genetic studies in familial dementia   Dyspnea 11/28/2016   Family history of breast cancer    Family history of lung cancer    Family history of prostate cancer    Frequent UTI    GERD (gastroesophageal reflux disease)    Hypertension    Osteoarthritis    hips, back   Osteopenia    Pneumonia    Spinal stenosis    Stroke (HCC) 1985   Past Surgical History:  Procedure Laterality Date   ABDOMINAL HYSTERECTOMY     partial has one ovary   APPENDECTOMY     BREAST LUMPECTOMY WITH RADIOACTIVE SEED LOCALIZATION Right 04/07/2019   Procedure: RIGHT BREAST LUMPECTOMY WITH RADIOACTIVE SEED LOCALIZATION;  Surgeon: Griselda Miner, MD;  Location: [facility] OR;  Service: General;  Laterality: Right;   CATARACT EXTRACTION Bilateral    LEFT HEART CATH AND CORONARY ANGIOGRAPHY N/A 11/29/2016   Procedure: Left Heart Cath and Coronary Angiography;  Surgeon: Tonny Bollman, MD;  Location: [facility] INVASIVE CV LAB;  Service: Cardiovascular;  Laterality: N/A;   LUMBAR DISC SURGERY     x 3   rotator cuff surgery Right 2010   Social History   Tobacco Use   Smoking status: Never   Smokeless tobacco: Never  Vaping Use   Vaping status: Never Used  Substance Use Topics   Alcohol use: No    Alcohol/week: 0.0 standard drinks of alcohol   Drug use: No   Family History  Problem Relation Age of Onset   Lung cancer Brother 57   [name] Mother        kidney tumor   Hypertension Mother    Diabetes Sister    Diabetes Brother    Diabetes Sister        x 2   Colon cancer Maternal Uncle    [name] Sister        intestine burst   Breast cancer Maternal Aunt        x 2    Breast cancer Daughter        unknown cancer   Diabetes Son        x 3   Diabetes Daughter    Breast cancer Sister        x2, diagnosed in 105s and 43s   Prostate cancer Maternal Uncle    Esophageal cancer Neg Hx    Rectal cancer Neg Hx    Stomach cancer Neg Hx    Allergies  Allergen Reactions   Amoxicillin     Reaction not known   Atorvastatin     aching   Cefpodoxime Proxetil     Reaction not known   Clarithromycin     Reaction not known   Fosamax [Alendronate Sodium]     Body pain    Furosemide     REACTION: doesn't help   Gabapentin Swelling    Caused severe swellling   Current Outpatient Medications on File Prior to Visit  Medication Sig Dispense Refill   acetaminophen (TYLENOL) 500 MG tablet Take 1,000 mg by mouth every 6 (six) hours as needed.     aspirin EC 81 MG tablet Take 81 mg by mouth daily.       Cholecalciferol (VITAMIN D-3) 1000 UNITS CAPS Take 1,000 Units by mouth daily.      diltiazem (CARDIZEM CD) 120 MG 24 hr capsule TAKE 1 CAPSULE EVERY DAY 90 capsule 3   doxylamine, Sleep, (UNISOM) 25 MG tablet Take 25 mg by mouth at bedtime as needed for sleep.      hydrochlorothiazide (HYDRODIURIL) 25 MG tablet TAKE 1 TABLET EVERY DAY 90 tablet 3   nitroGLYCERIN (NITROSTAT) 0.4 MG SL tablet Place 1 tablet (0.4 mg total) under the tongue every 5 (five) minutes as needed for chest pain (max 3 doses in 15 minutes). 25 tablet 3   nystatin cream (MYCOSTATIN) Apply 1 Application topically 2 (two) times daily as needed for dry skin. To affected itchy areas 30 g 0   polyethylene glycol (MIRALAX / GLYCOLAX) packet Take 17 g by mouth daily.      potassium chloride (KLOR-CON) 10 MEQ tablet TAKE 1 TABLET EVERY DAY 90 tablet 0   vitamin B-12 (CYANOCOBALAMIN) 1000 MCG tablet Take 1,000 mcg by mouth daily.     No current facility-administered medications on file prior to visit.    Review of Systems  Constitutional:  Negative for fever.  HENT:  Positive for ear pain and  hearing loss. Negative for ear discharge.        Some  occational discomfort /fullness in ears /especially the right   Musculoskeletal:  Positive for back pain.       Objective:   Physical Exam Constitutional:      General: She is not in acute distress.    Appearance: Normal appearance. She is normal weight. She is not ill-appearing.  HENT:     Head: Normocephalic and atraumatic.     Right Ear: Tympanic membrane and ear canal normal. There is impacted cerumen.     Left Ear: Tympanic membrane and ear canal normal. There is impacted cerumen.     Ears:     Comments: Procedure: Cerumen Disimpaction After consent obtained  Warm water was applied and gentle ear lavage performed on both ears   There were no complications and following the disimpaction the tympanic membrane were visible on the bilateral. Tympanic membranes are intact following the procedure.  Auditory canals are normal.  The patient reported relief of symptoms after removal of cerumen.  Pt tolerated procedure well      Nose: No congestion.     Mouth/Throat:     Mouth: Mucous membranes are moist.     Pharynx: Oropharynx is clear.  Eyes:     Conjunctiva/sclera: Conjunctivae normal.     Pupils: Pupils are equal, round, and reactive to light.  Cardiovascular:     Rate and Rhythm: Regular rhythm. Bradycardia present.  Skin:    General: Skin is warm and dry.     Findings: No erythema or rash.  Neurological:     Mental Status: She is alert.     Cranial Nerves: No cranial nerve deficit.  Psychiatric:        Mood and Affect: Mood normal.           Assessment & Plan:   Problem List Items Addressed This Visit       Nervous and Auditory   Cerumen impaction - Primary   Much improved/resolved after use of debrox and simple irrigation  Pt has baseline hearing loss but this improved after procedure also   Encouraged to periodically use debrox  Call back and Er precautions noted in detail today  - if ear pain or  dizziness

## 2023-09-16 NOTE — Assessment & Plan Note (Signed)
 Much improved/resolved after use of debrox and simple irrigation  Pt has baseline hearing loss but this improved after procedure also   Encouraged to periodically use debrox  Call back and Er precautions noted in detail today  - if ear pain or dizziness

## 2023-09-19 ENCOUNTER — Encounter: Payer: Self-pay | Admitting: Orthopedic Surgery

## 2023-09-20 ENCOUNTER — Ambulatory Visit
Admission: RE | Admit: 2023-09-20 | Discharge: 2023-09-20 | Disposition: A | Discharge status: Home / Self Care | Source: Ambulatory Visit | Attending: Physician Assistant | Admitting: Physician Assistant

## 2023-09-20 DIAGNOSIS — Z981 Arthrodesis status: Secondary | ICD-10-CM | POA: Diagnosis not present

## 2023-09-20 DIAGNOSIS — M48061 Spinal stenosis, lumbar region without neurogenic claudication: Secondary | ICD-10-CM | POA: Diagnosis not present

## 2023-09-20 DIAGNOSIS — G8929 Other chronic pain: Secondary | ICD-10-CM

## 2023-10-06 ENCOUNTER — Ambulatory Visit (INDEPENDENT_AMBULATORY_CARE_PROVIDER_SITE_OTHER): Admitting: Physician Assistant

## 2023-10-06 ENCOUNTER — Encounter: Payer: Self-pay | Admitting: Physician Assistant

## 2023-10-06 DIAGNOSIS — M48061 Spinal stenosis, lumbar region without neurogenic claudication: Secondary | ICD-10-CM | POA: Diagnosis not present

## 2023-10-06 NOTE — Progress Notes (Signed)
 HPI: Mrs. Shacklett returns today to review the MRI of her lumbar spine.  She states her symptoms have remained unchanged.  Her main complaint though is low back pain that is worse with standing.  She also notes pain into her thighs and continued some burning-like sensation down both legs.  And right is worse than the left. MRI images are reviewed with the patient.  MRI shows disc space narrowing at L1-2.  Mild foraminal narrowing bilaterally.  L2-3 chronic left paracentral protrusion.  Bilateral foraminal impingement severe on the left moderate on the right.  Compressive spinal stenosis at L2-3.  L3-4 degenerative disc disease with left foraminal impingement moderate.  L4-5 status post posterior lateral solid fusion with no impingement.  L5-S1 patent canal and foramina.  Impression: Lumbar spinal stenosis  Plan: Given patient's continued pain recommend epidural steroid injection lumbar spine for spinal stenosis.  Will set her up for an epidural steroid injection with Dr. Daisey Dryer in the near future.  Patient's daughter is to be called at [phone] to.  Follow-up pain persist or becomes worse.  Questions were encouraged and answered.

## 2023-10-14 ENCOUNTER — Telehealth: Payer: Self-pay | Admitting: Physical Medicine and Rehabilitation

## 2023-10-14 NOTE — Telephone Encounter (Signed)
 Patient's daughter called. Returning a call to schedule with [facility].

## 2023-10-27 ENCOUNTER — Encounter: Admitting: Physical Medicine and Rehabilitation

## 2023-11-20 ENCOUNTER — Other Ambulatory Visit: Payer: Self-pay | Admitting: Family Medicine

## 2024-01-26 ENCOUNTER — Ambulatory Visit (INDEPENDENT_AMBULATORY_CARE_PROVIDER_SITE_OTHER): Payer: Medicare HMO

## 2024-01-26 VITALS — BP 160/73 | Ht 61.5 in | Wt 145.0 lb

## 2024-01-26 DIAGNOSIS — Z0000 Encounter for general adult medical examination without abnormal findings: Secondary | ICD-10-CM

## 2024-01-26 NOTE — Progress Notes (Signed)
 Because this visit was a virtual/telehealth visit,  certain criteria was not obtained, such a blood pressure, CBG if applicable, and timed get up and go. Any medications not marked as taking were not mentioned during the medication reconciliation part of the visit. Any vitals not documented were not able to be obtained due to this being a telehealth visit or patient was unable to self-report a recent blood pressure reading due to a lack of equipment at home via telehealth. Vitals that have been documented are verbally provided by the patient.  This visit was performed by a medical professional under my direct supervision. I was immediately available for consultation/collaboration. I have reviewed and agree with the Annual Wellness Visit documentation.  Subjective:   Sara [name] is a 89 y.o. who presents for a Medicare Wellness preventive visit.  As a reminder, Annual Wellness Visits don't include a physical exam, and some assessments may be limited, especially if this visit is performed virtually. We may recommend an in-person follow-up visit with your provider if needed.  Visit Complete: Virtual I connected with  Sara [name] on 01/26/24 by a audio enabled telemedicine application and verified that I am speaking with the correct person using two identifiers.  Patient Location: Home  Provider Location: Home Office  I discussed the limitations of evaluation and management by telemedicine. The patient expressed understanding and agreed to proceed.  Vital Signs: Because this visit was a virtual/telehealth visit, some criteria may be missing or patient reported. Any vitals not documented were not able to be obtained and vitals that have been documented are patient reported.  VideoDeclined- This patient declined Librarian, academic. Therefore the visit was completed with audio only.  Persons Participating in Visit: Patient.  AWV Questionnaire: No:  Patient Medicare AWV questionnaire was not completed prior to this visit.  Cardiac Risk Factors include: advanced age (>64men, >34 women);hypertension     Objective:    Today's Vitals   01/26/24 1555  BP: (!) 160/73  Weight: 145 lb (65.8 kg)  Height: 5' 1.5 (1.562 m)   Body mass index is 26.95 kg/m.     01/26/2024    3:55 PM 01/20/2023    3:01 PM 04/15/2022    1:32 PM 01/17/2022   11:02 AM 08/14/2021   10:46 AM 12/22/2020   10:24 AM 04/26/2020    4:15 PM  Advanced Directives  Does Patient Have a Medical Advance Directive? Yes Yes No No Yes No No  Type of Estate agent of [place];Living will Healthcare Power of [place];Living will       Does patient want to make changes to medical advance directive? No - Patient declined        Copy of Healthcare Power of Attorney in Chart? No - copy requested No - copy requested       Would patient like information on creating a medical advance directive?   No - Patient declined No - Patient declined  No - Patient declined Yes (MAU/Ambulatory/Procedural Areas - Information given)    Current Medications (verified) Outpatient Encounter Medications as of 01/26/2024  Medication Sig   acetaminophen  (TYLENOL ) 500 MG tablet Take 1,000 mg by mouth every 6 (six) hours as needed.   aspirin  EC 81 MG tablet Take 81 mg by mouth daily.     Cholecalciferol  (VITAMIN D -3) 1000 UNITS CAPS Take 1,000 Units by mouth daily.    diltiazem  (CARDIZEM  CD) 120 MG 24 hr capsule TAKE 1 CAPSULE EVERY DAY   doxylamine,  Sleep, (UNISOM) 25 MG tablet Take 25 mg by mouth at bedtime as needed for sleep.    hydrochlorothiazide  (HYDRODIURIL ) 25 MG tablet TAKE 1 TABLET EVERY DAY   nitroGLYCERIN  (NITROSTAT ) 0.4 MG SL tablet Place 1 tablet (0.4 mg total) under the tongue every 5 (five) minutes as needed for chest pain (max 3 doses in 15 minutes).   nystatin  cream (MYCOSTATIN ) Apply 1 Application topically 2 (two) times daily as needed for dry skin. To affected  itchy areas   polyethylene glycol (MIRALAX  / GLYCOLAX ) packet Take 17 g by mouth daily.    potassium chloride  (KLOR-CON ) 10 MEQ tablet TAKE 1 TABLET EVERY DAY   vitamin B-12 (CYANOCOBALAMIN ) 1000 MCG tablet Take 1,000 mcg by mouth daily.   No facility-administered encounter medications on file as of 01/26/2024.    Allergies (verified) Amoxicillin, Atorvastatin , Cefpodoxime proxetil, Clarithromycin, Fosamax  [alendronate  sodium], Furosemide, and Gabapentin   History: Past Medical History:  Diagnosis Date   Allergy    CAD (coronary artery disease)    Chronic diastolic (congestive) heart failure (HCC)    Clinical trial participant    [facility] Genetic studies in familial dementia   Dyspnea 11/28/2016   Family history of breast cancer    Family history of lung cancer    Family history of prostate cancer    Frequent UTI    GERD (gastroesophageal reflux disease)    Hypertension    Osteoarthritis    hips, back   Osteopenia    Pneumonia    Spinal stenosis    Stroke (HCC) 1985   Past Surgical History:  Procedure Laterality Date   ABDOMINAL HYSTERECTOMY     partial has one ovary   APPENDECTOMY     BREAST LUMPECTOMY WITH RADIOACTIVE SEED LOCALIZATION Right 04/07/2019   Procedure: RIGHT BREAST LUMPECTOMY WITH RADIOACTIVE SEED LOCALIZATION;  Surgeon: Curvin Deward MOULD, MD;  Location: [facility] OR;  Service: General;  Laterality: Right;   CATARACT EXTRACTION Bilateral    LEFT HEART CATH AND CORONARY ANGIOGRAPHY N/A 11/29/2016   Procedure: Left Heart Cath and Coronary Angiography;  Surgeon: Wonda Sharper, MD;  Location: [facility] INVASIVE CV LAB;  Service: Cardiovascular;  Laterality: N/A;   LUMBAR DISC SURGERY     x 3   rotator cuff surgery Right 2010   Family History  Problem Relation Age of Onset   Lung cancer Brother 56   Other Mother        kidney tumor   Hypertension Mother    Diabetes Sister    Diabetes Brother    Diabetes Sister        x 2   Colon cancer Maternal Uncle    Other  Sister        intestine burst   Breast cancer Maternal Aunt        x 2   Breast cancer Daughter        unknown cancer   Diabetes Son        x 3   Diabetes Daughter    Breast cancer Sister        x2, diagnosed in 36s and 107s   Prostate cancer Maternal Uncle    Esophageal cancer Neg Hx    Rectal cancer Neg Hx    Stomach cancer Neg Hx    Social History   Socioeconomic History   Marital status: Widowed    Spouse name: Not on file   Number of children: 6   Years of education: Not on file   Highest education  level: Not on file  Occupational History   Occupation: RETIRED    Employer: RETIRED  Tobacco Use   Smoking status: Never   Smokeless tobacco: Never  Vaping Use   Vaping status: Never Used  Substance and Sexual Activity   Alcohol use: No    Alcohol/week: 0.0 standard drinks of alcohol   Drug use: No   Sexual activity: Not Currently  Other Topics Concern   Not on file  Social History Narrative   Not on file   Social Drivers of Health   Financial Resource Strain: Low Risk  (01/26/2024)   Overall Financial Resource Strain (CARDIA)    Difficulty of Paying Living Expenses: Not hard at all  Food Insecurity: No Food Insecurity (01/26/2024)   Hunger Vital Sign    Worried About Running Out of Food in the Last Year: Never true    Ran Out of Food in the Last Year: Never true  Transportation Needs: No Transportation Needs (01/26/2024)   PRAPARE - Administrator, Civil Service (Medical): No    Lack of Transportation (Non-Medical): No  Physical Activity: Insufficiently Active (01/26/2024)   Exercise Vital Sign    Days of Exercise per Week: 7 days    Minutes of Exercise per Session: 10 min  Stress: No Stress Concern Present (01/26/2024)   [employer] of Occupational Health - Occupational Stress Questionnaire    Feeling of Stress: Not at all  Social Connections: Moderately Integrated (01/26/2024)   Social Connection and Isolation Panel    Frequency of  Communication with Friends and Family: More than three times a week    Frequency of Social Gatherings with Friends and Family: More than three times a week    Attends Religious Services: More than 4 times per year    Active Member of [employer] or Organizations: Yes    Attends Banker Meetings: Never    Marital Status: Widowed    Tobacco Counseling Counseling given: Not Answered    Clinical Intake:  Pre-visit preparation completed: Yes  Pain : No/denies pain     BMI - recorded: 26.95 Nutritional Status: BMI 25 -29 Overweight Nutritional Risks: None Diabetes: No  Lab Results  Component Value Date   HGBA1C 6.1 02/11/2023   HGBA1C 6.3 02/12/2022   HGBA1C 6.3 01/09/2021     How often do you need to have someone help you when you read instructions, pamphlets, or other written materials from your doctor or pharmacy?: 1 - Never  Interpreter Needed?: No      Activities of Daily Living     01/26/2024    3:59 PM  In your present state of health, do you have any difficulty performing the following activities:  Hearing? 0  Vision? 0  Difficulty concentrating or making decisions? 0  Walking or climbing stairs? 0  Dressing or bathing? 0  Doing errands, shopping? 0  Preparing Food and eating ? N  Using the Toilet? N  In the past six months, have you accidently leaked urine? Y  Do you have problems with loss of bowel control? N  Managing your Medications? N  Managing your Finances? N  Housekeeping or managing your Housekeeping? N    Patient Care Team: Tower, Laine LABOR, MD as PCP - General Jeffrie Oneil BROCKS, MD as PCP - Cardiology (Cardiology) Curvin Deward MOULD, MD as Consulting Physician (General Surgery) Izell Domino, MD as Attending Physician (Radiation Oncology) Odean Potts, MD as Consulting Physician (Hematology and Oncology)  I have updated your  Care Teams any recent Medical Services you may have received from other providers in the past year.      Assessment:   This is a routine wellness examination for Sara [name].  Hearing/Vision screen Hearing Screening - Comments:: Wears hearing aids  Vision Screening - Comments:: Patient wears readers   Goals Addressed             This Visit's Progress    Patient Stated   On track    Stay active       Depression Screen     01/26/2024    4:02 PM 09/16/2023    3:32 PM 02/11/2023   10:53 AM 01/20/2023    3:00 PM 01/17/2022   11:00 AM 01/09/2021    9:27 AM 12/22/2020   10:40 AM  PHQ 2/9 Scores  PHQ - 2 Score 2 0 0 0 0 0 0  PHQ- 9 Score 2 1 2   0      Fall Risk     01/26/2024    3:58 PM 09/16/2023    3:32 PM 01/20/2023    3:05 PM 01/17/2022   11:03 AM 07/24/2021    9:59 AM  Fall Risk   Falls in the past year? 1 0 0 1   Number falls in past yr: 0 0 0 0   Injury with Fall? 0 0 0 0 0  Risk for fall due to : No Fall Risks No Fall Risks No Fall Risks History of fall(s) History of fall(s);Impaired mobility  Risk for fall due to: Comment     uses cane for ambulating outside  Follow up Falls evaluation completed Falls evaluation completed Falls prevention discussed;Falls evaluation completed Falls evaluation completed;Falls prevention discussed  Falls prevention discussed      Data saved with a previous flowsheet row definition    MEDICARE RISK AT HOME:  Medicare Risk at Home Any stairs in or around the home?: Yes If so, are there any without handrails?: No Home free of loose throw rugs in walkways, pet beds, electrical cords, etc?: Yes Adequate lighting in your home to reduce risk of falls?: Yes Life alert?: No Use of a cane, walker or w/c?: Yes Grab bars in the bathroom?: Yes Shower chair or bench in shower?: Yes Elevated toilet seat or a handicapped toilet?: Yes  TIMED UP AND GO:  Was the test performed?  No  Cognitive Function: 6CIT completed    12/18/2017   10:45 AM  MMSE - Mini Mental State Exam  Orientation to time 5  Orientation to Place 5  Registration 3  Attention/  Calculation 0  Recall 1  Recall-comments unable to recall 2 of 3 words  Language- name 2 objects 0  Language- repeat 1  Language- follow 3 step command 3  Language- read & follow direction 0  Write a sentence 0  Copy design 0  Total score 18        01/26/2024    4:03 PM 01/20/2023    3:08 PM  6CIT Screen  What Year? 0 points 0 points  What month? 0 points 0 points  What time? 0 points 0 points  Count back from 20 0 points 0 points  Months in reverse 0 points 0 points  Repeat phrase 0 points 6 points  Total Score 0 points 6 points    Immunizations Immunization History  Administered Date(s) Administered   Fluad Quad(high Dose 65+) 02/19/2019   Fluad Trivalent(High Dose 65+) 02/11/2023   Influenza Split 05/20/2011   Influenza  Whole 03/25/2006, 04/07/2008, 04/11/2009, 03/08/2010, 03/04/2012   Influenza, High Dose Seasonal PF 02/25/2022   Influenza,inj,Quad PF,6+ Mos 03/05/2013, 02/11/2014, 03/06/2015, 03/15/2016, 03/11/2017, 02/19/2020   Influenza-Unspecified 03/08/2018, 03/17/2021   PFIZER Comirnaty(Gray Top)Covid-19 Tri-Sucrose Vaccine 10/14/2020   PFIZER(Purple Top)SARS-COV-2 Vaccination 06/25/2019, 07/16/2019, 03/11/2020, 03/17/2021   Pneumococcal Conjugate-13 12/16/2016   Pneumococcal Polysaccharide-23 02/09/1996, 06/16/2013   Td 03/23/1998   Unspecified SARS-COV-2 Vaccination 02/25/2022   Zoster Recombinant(Shingrix) 12/25/2017, 02/25/2022    Screening Tests Health Maintenance  Topic Date Due   INFLUENZA VACCINE  01/09/2024   COVID-19 Vaccine (7 - 2024-25 season) 09/10/2024 (Originally 02/09/2023)   DTaP/Tdap/Td (2 - Tdap) 09/15/2024 (Originally 03/23/2008)   MAMMOGRAM  03/17/2024   Medicare Annual Wellness (AWV)  01/25/2025   Pneumococcal Vaccine: 50+ Years  Completed   DEXA SCAN  Completed   Zoster Vaccines- Shingrix  Completed   HPV VACCINES  Aged Out   Meningococcal B Vaccine  Aged Out   Pneumococcal Vaccine  Discontinued    Health Maintenance  Health  Maintenance Due  Topic Date Due   INFLUENZA VACCINE  01/09/2024   Health Maintenance Items Addressed:   Additional Screening:  Vision Screening: Recommended annual ophthalmology exams for early detection of glaucoma and other disorders of the eye. Would you like a referral to an eye doctor? No    Dental Screening: Recommended annual dental exams for proper oral hygiene  Community Resource Referral / Chronic Care Management: CRR required this visit?  No   CCM required this visit?  No   Plan:    I have personally reviewed and noted the following in the patient's chart:   Medical and social history Use of alcohol, tobacco or illicit drugs  Current medications and supplements including opioid prescriptions. Patient is not currently taking opioid prescriptions. Functional ability and status Nutritional status Physical activity Advanced directives List of other physicians Hospitalizations, surgeries, and ER visits in previous 12 months Vitals Screenings to include cognitive, depression, and falls Referrals and appointments  In addition, I have reviewed and discussed with patient certain preventive protocols, quality metrics, and best practice recommendations. A written personalized care plan for preventive services as well as general preventive health recommendations were provided to patient.   Lyle MARLA Right, [place]   01/26/2024   After Visit Summary: (MyChart) Due to this being a telephonic visit, the after visit summary with patients personalized plan was offered to patient via MyChart   Notes: Nothing significant to report at this time.

## 2024-01-26 NOTE — Patient Instructions (Signed)
 Sara [name] , Thank you for taking time out of your busy schedule to complete your Annual Wellness Visit with me. I enjoyed our conversation and look forward to speaking with you again next year. I, as well as your care team,  appreciate your ongoing commitment to your health goals. Please review the following plan we discussed and let me know if I can assist you in the future. Your Game plan/ To Do List    Referrals: If you haven't heard from the office you've been referred to, please reach out to them at the phone provided.   Follow up Visits: We will see or speak with you next year for your Next Medicare AWV with our clinical staff Have you seen your provider in the last 6 months (3 months if uncontrolled diabetes)? No  Clinician Recommendations:  Aim for 30 minutes of exercise or brisk walking, 6-8 glasses of water, and 5 servings of fruits and vegetables each day.       This is a list of the screenings recommended for you:  Health Maintenance  Topic Date Due   Flu Shot  01/09/2024   COVID-19 Vaccine (7 - 2024-25 season) 09/10/2024*   DTaP/Tdap/Td vaccine (2 - Tdap) 09/15/2024*   Mammogram  03/17/2024   Medicare Annual Wellness Visit  01/25/2025   Pneumococcal Vaccine for age over 48  Completed   DEXA scan (bone density measurement)  Completed   Zoster (Shingles) Vaccine  Completed   HPV Vaccine  Aged Out   Meningitis B Vaccine  Aged Out   Pneumococcal Vaccine  Discontinued  *Topic was postponed. The date shown is not the original due date.    Advanced directives: (Declined) Advance directive discussed with you today. Even though you declined this today, please call our office should you change your mind, and we can give you the proper paperwork for you to fill out. Advance Care Planning is important because it:  [x]  Makes sure you receive the medical care that is consistent with your values, goals, and preferences  [x]  It provides guidance to your family and loved ones and  reduces their decisional burden about whether or not they are making the right decisions based on your wishes.  Follow the link provided in your after visit summary or read over the paperwork we have mailed to you to help you started getting your Advance Directives in place. If you need assistance in completing these, please reach out to us  so that we can help you!  See attachments for Preventive Care and Fall Prevention Tips.

## 2024-02-02 ENCOUNTER — Other Ambulatory Visit: Payer: Self-pay | Admitting: Family Medicine

## 2024-03-17 ENCOUNTER — Ambulatory Visit: Admitting: Physical Medicine and Rehabilitation

## 2024-03-17 ENCOUNTER — Encounter: Payer: Self-pay | Admitting: Physical Medicine and Rehabilitation

## 2024-03-17 DIAGNOSIS — M5441 Lumbago with sciatica, right side: Secondary | ICD-10-CM | POA: Diagnosis not present

## 2024-03-17 DIAGNOSIS — M48062 Spinal stenosis, lumbar region with neurogenic claudication: Secondary | ICD-10-CM

## 2024-03-17 DIAGNOSIS — G8929 Other chronic pain: Secondary | ICD-10-CM | POA: Diagnosis not present

## 2024-03-17 DIAGNOSIS — M5416 Radiculopathy, lumbar region: Secondary | ICD-10-CM

## 2024-03-17 MED ORDER — TRAMADOL HCL 50 MG PO TABS: 50.0000 mg | ORAL_TABLET | Freq: Three times a day (TID) | ORAL | 0 refills | Status: AC | PRN

## 2024-03-17 NOTE — Progress Notes (Signed)
 Sara [name] - 89 y.o. female MRN [id number]  Date of birth: 09-Aug-1931  Office Visit Note: Visit Date: 03/17/2024 PCP: Randeen Laine LABOR, MD Referred by: Tower, Laine LABOR, MD  Subjective: Chief Complaint  Patient presents with   Lower Back - Pain   HPI: Sara [name] is a 89 y.o. female who comes in today as a self referral for evaluation of chronic, worsening and severe bilateral lower back pain radiating down right leg. She is previous patient of Dr. Lynwood Better. Pain ongoing for several years, worsens with prolonged standing and walking. She describes pain as burning and sore sensation, currently rates as 8 out of 10. Some relief of pain with home exercise regimen, rest and use of medications. Recent lumbar MRI imaging shows advanced spinal canal stenosis at L2-L3. Uncomplicated L4-L5 fusion. She has history of (3) lumbar spine surguries. Patient was scheduled to undergo right L3-L4 interlaminar epidural steroid injection in our office this past May. She had to cancel her appointment due to family issues. She is currently using cane to assist with ambulation. Patient denies focal weakness, numbness and tingling. No recent trauma or falls.      Review of Systems  Musculoskeletal:  Positive for back pain.  Neurological:  Negative for tingling, sensory change, focal weakness and weakness.  All other systems reviewed and are negative.  Otherwise per HPI.  Assessment & Plan: Visit Diagnoses:    ICD-10-CM   1. Chronic bilateral low back pain with right-sided sciatica  G89.29 Ambulatory referral to Physical Medicine Rehab   M54.41     2. Radiculopathy, lumbar region  M54.16 Ambulatory referral to Physical Medicine Rehab    3. Spinal stenosis of lumbar region with neurogenic claudication  M48.062 Ambulatory referral to Physical Medicine Rehab       Plan: Findings:  Chronic, worsening and severe bilateral lower back pain radiating down right lower extremity.  Patient  continues to have severe pain despite good conservative therapy such as home exercise regimen, rest and use of medications.  Patient's clinical presentation and exam are consistent with neurogenic claudication as a result of spinal canal stenosis.  I discussed recent lumbar MRI imaging from May of this year.  There is advanced spinal canal stenosis noted at the level of L2-L3.  Patient was quite confused about the origin of her pain.  I explained to her that central canal stenosis in the lumbar spine can cause pain and paresthesias in the legs.  We discussed treatment plan in detail today.  Next step is to perform diagnostic and hopefully therapeutic right L3-L4 interlaminar epidural steroid injection under fluoroscopic guidance.  She is not currently taking anticoagulant medication.  If good relief of pain with injection we can repeat this procedure infrequently as needed.  I discussed injection procedure with her in detail today, she has no questions at this time.  I did refill short course of Tramadol  for her to take for severe discomfort.  No red flag symptoms noted upon exam today.    Meds & Orders:  Meds ordered this encounter  Medications   traMADol  (ULTRAM ) 50 MG tablet    Sig: Take 1 tablet (50 mg total) by mouth every 8 (eight) hours as needed.    Dispense:  20 tablet    Refill:  0    Orders Placed This Encounter  Procedures   Ambulatory referral to Physical Medicine Rehab    Follow-up: Return for Right L3-L4 interlaminar epidural steroid injection.   Procedures: No  procedures performed      Clinical History: Study Result  Narrative & Impression CLINICAL DATA:  Lumbar spinal stenosis   EXAM: MRI LUMBAR SPINE WITHOUT CONTRAST   TECHNIQUE: Multiplanar, multisequence MR imaging of the lumbar spine was performed. No intravenous contrast was administered.   COMPARISON:  10/29/2018   FINDINGS: Segmentation:  Standard.   Alignment: Levoscoliosis and L4-5 anterolisthesis.  Mild L1-2 and L2-3 retrolisthesis.   Vertebrae: No fracture, evidence of discitis, or bone lesion. Endplate edema posteriorly at L3-4, degenerative.   Conus medullaris and cauda equina: Conus extends to the L1-2 level. Conus and cauda equina appear normal.   Paraspinal and other soft tissues: Atrophy of intrinsic back muscles with subcutaneous scarring   Disc levels:   T12- L1: Spondylitic spurring with small left paracentral protrusion. Negative facets   L1-L2: Disc space narrowing and bulging with endplate and facet spurring. Mild bilateral foraminal narrowing   L2-L3: Disc collapse and endplate degeneration with endplate ridging and facet spurring on both sides. Left paracentral protrusion which is chronic. Advanced and compressive spinal stenosis. Bilateral foraminal impingement, advanced on the left and moderate on the right.   L3-L4: Disc narrowing and bulging with small left paracentral protrusion and buttressing osteophyte, unchanged. Disc bulging, disc height loss, and asymmetric left facet spurring causes left foraminal impingement that is moderate   L4-L5: PLIF with posterior-lateral solid fusion and no impingement   L5-S1:Disc narrowing and bulging with degenerative facet spurring. Posterior decompression with patent canal and foramina.   IMPRESSION: 1. Generalized lumbar spine degeneration without focal or notable progression since 2020. 2. L2-3 advanced spinal stenosis and L2-3, L3-4 left foraminal impingement. 3. Uncomplicated L4-5 fusion.     Electronically Signed   By: Dorn Roulette M.D.   On: 10/03/2023 10:35   She reports that she has never smoked. She has never used smokeless tobacco. No results for input(s): HGBA1C, LABURIC in the last 8760 hours.  Objective:  VS:  HT:    WT:   BMI:     BP:   HR: bpm  TEMP: ( )  RESP:  Physical Exam Vitals and nursing note reviewed.  HENT:     Head: Normocephalic and atraumatic.     Right Ear:  External ear normal.     Left Ear: External ear normal.     Nose: Nose normal.     Mouth/Throat:     Mouth: Mucous membranes are moist.  Eyes:     Extraocular Movements: Extraocular movements intact.  Cardiovascular:     Rate and Rhythm: Normal rate.     Pulses: Normal pulses.  Pulmonary:     Effort: Pulmonary effort is normal.  Abdominal:     General: Abdomen is flat. There is no distension.  Musculoskeletal:        General: Tenderness present.     Cervical back: Normal range of motion.     Comments: Patient is slow to rise from seated position to standing. Good lumbar range of motion. No pain noted with facet loading. 5/5 strength noted with bilateral hip flexion, knee flexion/extension, ankle dorsiflexion/plantarflexion and EHL. No clonus noted bilaterally. No pain upon palpation of greater trochanters. No pain with internal/external rotation of bilateral hips. Sensation intact bilaterally. Negative slump test bilaterally. Ambulates with cane, gait slow and unsteady.  Skin:    General: Skin is warm and dry.     Capillary Refill: Capillary refill takes less than 2 seconds.  Neurological:     General: No focal deficit present.  Mental Status: She is alert and oriented to person, place, and time.  Psychiatric:        Mood and Affect: Mood normal.        Behavior: Behavior normal.     Ortho Exam  Imaging: No results found.  Past Medical/Family/Surgical/Social History: Medications & Allergies reviewed per EMR, new medications updated. Patient Active Problem List   Diagnosis Date Noted   Right leg pain 08/26/2023   Hemoptysis 08/06/2023   URI with cough and congestion 08/06/2023   Tongue lesion 02/11/2023   Allergic rhinitis 02/11/2023   Skin lump of arm, right 02/12/2022   Cerumen impaction 09/12/2021   Aortic root enlargement 01/09/2021   Neck pain 01/18/2020   Left shoulder pain 01/18/2020   Pain in both upper arms 01/18/2020   Thyroid  nodule 01/04/2020   Flank  pain 11/22/2019   Hand pain, right 10/18/2019   Family history of breast cancer    Family history of prostate cancer    Family history of lung cancer    Carcinoma of upper-outer quadrant of right breast in female, estrogen receptor positive (HCC) 02/23/2019   Pre-operative clearance 02/21/2019   History of breast cancer 02/21/2019   Medicare annual wellness visit, subsequent 01/05/2019   Encounter for screening mammogram for breast cancer 01/05/2019   Knee pain, left 10/14/2018   Arthritis of left hip 07/29/2018   Bradycardia 07/01/2018   Left ear pain 07/01/2018   Chronic diastolic heart failure (HCC) 12/27/2017   Lumbar spinal stenosis 03/11/2017   Diastolic dysfunction 01/14/2017   Estrogen deficiency 12/16/2016   Routine general medical examination at a health care facility 12/16/2016   CAD (coronary artery disease) 12/04/2016   Intertrigo 03/15/2016   Mucocele, 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   Eczema 03/06/2015   Prediabetes 02/11/2014   CONSTIPATION, CHRONIC 07/03/2010   Osteoporosis 06/24/2008   HYPERCHOLESTEROLEMIA 12/08/2006   Essential hypertension 12/08/2006   GERD 12/08/2006   H/O herpes zoster 12/08/2006   Past Medical History:  Diagnosis Date   Allergy    CAD (coronary artery disease)    Chronic diastolic (congestive) heart failure (HCC)    Clinical trial participant    [facility] Genetic studies in familial dementia   Dyspnea 11/28/2016   Family history of breast cancer    Family history of lung cancer    Family history of prostate cancer    Frequent UTI    GERD (gastroesophageal reflux disease)    Hypertension    Osteoarthritis    hips, back   Osteopenia    Pneumonia    Spinal stenosis    Stroke (HCC) 1985   Family History  Problem Relation Age of Onset   Lung cancer Brother 75   Other Mother        kidney tumor   Hypertension Mother    Diabetes Sister    Diabetes Brother    Diabetes Sister        x 2   Colon cancer Maternal Uncle    Other Sister         intestine burst   Breast cancer Maternal Aunt        x 2   Breast cancer Daughter        unknown cancer   Diabetes Son        x 3   Diabetes Daughter    Breast cancer Sister        x2, diagnosed in 52s and 40s   Prostate cancer Maternal Uncle    Esophageal  cancer Neg Hx    Rectal cancer Neg Hx    Stomach cancer Neg Hx    Past Surgical History:  Procedure Laterality Date   ABDOMINAL HYSTERECTOMY     partial has one ovary   APPENDECTOMY     BREAST LUMPECTOMY WITH RADIOACTIVE SEED LOCALIZATION Right 04/07/2019   Procedure: RIGHT BREAST LUMPECTOMY WITH RADIOACTIVE SEED LOCALIZATION;  Surgeon: Curvin Deward MOULD, MD;  Location: [facility] OR;  Service: General;  Laterality: Right;   CATARACT EXTRACTION Bilateral    LEFT HEART CATH AND CORONARY ANGIOGRAPHY N/A 11/29/2016   Procedure: Left Heart Cath and Coronary Angiography;  Surgeon: Wonda Sharper, MD;  Location: [facility] INVASIVE CV LAB;  Service: Cardiovascular;  Laterality: N/A;   LUMBAR DISC SURGERY     x 3   rotator cuff surgery Right 2010   Social History   Occupational History   Occupation: RETIRED    Employer: RETIRED  Tobacco Use   Smoking status: Never   Smokeless tobacco: Never  Vaping Use   Vaping status: Never Used  Substance and Sexual Activity   Alcohol use: No    Alcohol/week: 0.0 standard drinks of alcohol   Drug use: No   Sexual activity: Not Currently

## 2024-03-17 NOTE — Progress Notes (Signed)
 Pain Scale   Average Pain 10 Patient advising she he's chronic lower back pain radiating to right hip and leg  pain.        +Driver, -BT, -Dye Allergies.

## 2024-03-23 DIAGNOSIS — Z1231 Encounter for screening mammogram for malignant neoplasm of breast: Secondary | ICD-10-CM | POA: Diagnosis not present

## 2024-03-23 LAB — HM MAMMOGRAPHY

## 2024-03-24 ENCOUNTER — Encounter: Payer: Self-pay | Admitting: Family Medicine

## 2024-04-12 ENCOUNTER — Other Ambulatory Visit: Payer: Self-pay

## 2024-04-12 ENCOUNTER — Ambulatory Visit: Admitting: Physical Medicine and Rehabilitation

## 2024-04-12 VITALS — BP 169/90 | HR 53

## 2024-04-12 DIAGNOSIS — M5416 Radiculopathy, lumbar region: Secondary | ICD-10-CM | POA: Diagnosis not present

## 2024-04-12 MED ORDER — METHYLPREDNISOLONE ACETATE 40 MG/ML IJ SUSP
40.0000 mg | Freq: Once | INTRAMUSCULAR | Status: AC
  Administered 2024-04-12: 40 mg

## 2024-04-12 NOTE — Progress Notes (Unsigned)
 Pain Scale   Average Pain 9 Patietn advising she has chronic lower back pain that radiates to her right leg annd pain increases when sitting for along time and decreases when walking and moving.        +Driver, -BT, -Dye Allergies.

## 2024-04-13 NOTE — Progress Notes (Signed)
 Sara [name] - 89 y.o. female MRN [id number]  Date of birth: 1932-04-01  Office Visit Note: Visit Date: 04/12/2024 PCP: Randeen Laine LABOR, MD Referred by: Tower, Laine LABOR, MD  Subjective: Chief Complaint  Patient presents with   Lower Back - Pain   HPI:  Sara [name] is a 89 y.o. female who comes in today at the request of Duwaine Pouch, FNP and Tory Gaskins, PA-C for planned Right L3-4 Lumbar Interlaminar epidural steroid injection with fluoroscopic guidance.  The patient has failed conservative care including home exercise, medications, time and activity modification.  This injection will be diagnostic and hopefully therapeutic.  Please see requesting physician notes for further details and justification.  She tells me she gets more right low back pain then really into the hip and leg which occurs occasionally.  She does have pretty significant stenosis at L2-3 with an open space at L3-4 which could have been decompressed at some point looking at the MRI but no known history of that and then fusion at L4-5.  Depending on relief would look at L5 to her L3 transforaminal injection versus facet block.    ROS Otherwise per HPI.  Assessment & Plan: Visit Diagnoses:    ICD-10-CM   1. Lumbar radiculopathy  M54.16 XR C-ARM NO REPORT    Epidural Steroid injection    methylPREDNISolone  acetate (DEPO-MEDROL ) injection 40 mg      Plan: No additional findings.   Meds & Orders:  Meds ordered this encounter  Medications   methylPREDNISolone  acetate (DEPO-MEDROL ) injection 40 mg    Orders Placed This Encounter  Procedures   XR C-ARM NO REPORT   Epidural Steroid injection    Follow-up: Return for visit to requesting provider as needed.   Procedures: No procedures performed  Lumbar Epidural Steroid Injection - Interlaminar Approach with Fluoroscopic Guidance  Patient: Sara [name]      Date of Birth: November 29, 1931 MRN: [id number] PCP: Randeen Laine LABOR,  MD      Visit Date: 04/12/2024   Universal Protocol:     Consent Given By: the patient  Position: PRONE  Additional Comments: Vital signs were monitored before and after the procedure. Patient was prepped and draped in the usual sterile fashion. The correct patient, procedure, and site was verified.   Injection Procedure Details:   Procedure diagnoses: Lumbar radiculopathy [M54.16]   Meds Administered:  Meds ordered this encounter  Medications   methylPREDNISolone  acetate (DEPO-MEDROL ) injection 40 mg     Laterality: Right  Location/Site:  L3-4  Needle: 3.5 in., 20 ga. Tuohy  Needle Placement: Paramedian epidural  Findings:   -Comments: Excellent flow of contrast into the epidural space.  Several passing attempts were made and clear feel of epidural entry was made although not really good loss-of-resistance and really cannot see the flow contrast.  Ultimately did complete injection right at the facet joint and interlaminar space with good end feel but again suboptimal of contrast.  Procedure Details: Using a paramedian approach from the side mentioned above, the region overlying the inferior lamina was localized under fluoroscopic visualization and the soft tissues overlying this structure were infiltrated with 4 ml. of 1% Lidocaine  without Epinephrine . The Tuohy needle was inserted into the epidural space using a paramedian approach.   The epidural space was localized using loss of resistance along with counter oblique bi-planar fluoroscopic views.  After negative aspirate for air, blood, and CSF, a 2 ml. volume of Isovue -250 was injected into the epidural space and  the flow of contrast was observed. Radiographs were obtained for documentation purposes.    The injectate was administered into the level noted above.   Additional Comments:  The patient tolerated the procedure well Dressing: 2 x 2 sterile gauze and Band-Aid    Post-procedure details: Patient was  observed during the procedure. Post-procedure instructions were reviewed.  Patient left the clinic in stable condition.   Clinical History: Study Result  Narrative & Impression CLINICAL DATA:  Lumbar spinal stenosis   EXAM: MRI LUMBAR SPINE WITHOUT CONTRAST   TECHNIQUE: Multiplanar, multisequence MR imaging of the lumbar spine was performed. No intravenous contrast was administered.   COMPARISON:  10/29/2018   FINDINGS: Segmentation:  Standard.   Alignment: Levoscoliosis and L4-5 anterolisthesis. Mild L1-2 and L2-3 retrolisthesis.   Vertebrae: No fracture, evidence of discitis, or bone lesion. Endplate edema posteriorly at L3-4, degenerative.   Conus medullaris and cauda equina: Conus extends to the L1-2 level. Conus and cauda equina appear normal.   Paraspinal and other soft tissues: Atrophy of intrinsic back muscles with subcutaneous scarring   Disc levels:   T12- L1: Spondylitic spurring with small left paracentral protrusion. Negative facets   L1-L2: Disc space narrowing and bulging with endplate and facet spurring. Mild bilateral foraminal narrowing   L2-L3: Disc collapse and endplate degeneration with endplate ridging and facet spurring on both sides. Left paracentral protrusion which is chronic. Advanced and compressive spinal stenosis. Bilateral foraminal impingement, advanced on the left and moderate on the right.   L3-L4: Disc narrowing and bulging with small left paracentral protrusion and buttressing osteophyte, unchanged. Disc bulging, disc height loss, and asymmetric left facet spurring causes left foraminal impingement that is moderate   L4-L5: PLIF with posterior-lateral solid fusion and no impingement   L5-S1:Disc narrowing and bulging with degenerative facet spurring. Posterior decompression with patent canal and foramina.   IMPRESSION: 1. Generalized lumbar spine degeneration without focal or notable progression since 2020. 2. L2-3  advanced spinal stenosis and L2-3, L3-4 left foraminal impingement. 3. Uncomplicated L4-5 fusion.     Electronically Signed   By: Dorn Roulette M.D.   On: 10/03/2023 10:35     Objective:  VS:  HT:    WT:   BMI:     BP:(!) 169/90  HR:(!) 53bpm  TEMP: ( )  RESP:  Physical Exam Vitals and nursing note reviewed.  Constitutional:      General: She is not in acute distress.    Appearance: Normal appearance. She is not ill-appearing.  HENT:     Head: Normocephalic and atraumatic.     Right Ear: External ear normal.     Left Ear: External ear normal.  Eyes:     Extraocular Movements: Extraocular movements intact.  Cardiovascular:     Rate and Rhythm: Normal rate.     Pulses: Normal pulses.  Pulmonary:     Effort: Pulmonary effort is normal. No respiratory distress.  Abdominal:     General: There is no distension.     Palpations: Abdomen is soft.  Musculoskeletal:        General: Tenderness present.     Cervical back: Neck supple.     Right lower leg: No edema.     Left lower leg: No edema.     Comments: Patient has good distal strength with no pain over the greater trochanters.  No clonus or focal weakness.  Skin:    Findings: No erythema, lesion or rash.  Neurological:     General: No  focal deficit present.     Mental Status: She is alert and oriented to person, place, and time.     Cranial Nerves: No cranial nerve deficit.     Sensory: No sensory deficit.     Motor: No weakness or abnormal muscle tone.     Coordination: Coordination normal.     Gait: Gait abnormal.  Psychiatric:        Mood and Affect: Mood normal.        Behavior: Behavior normal.      Imaging: XR C-ARM NO REPORT Result Date: 04/12/2024 Please see Notes tab for imaging impression.

## 2024-04-13 NOTE — Procedures (Signed)
 Lumbar Epidural Steroid Injection - Interlaminar Approach with Fluoroscopic Guidance  Patient: Sara [name]      Date of Birth: [birth year] MRN: [id number] PCP: Randeen Laine LABOR, MD      Visit Date: 04/12/2024   Universal Protocol:     Consent Given By: the patient  Position: PRONE  Additional Comments: Vital signs were monitored before and after the procedure. Patient was prepped and draped in the usual sterile fashion. The correct patient, procedure, and site was verified.   Injection Procedure Details:   Procedure diagnoses: Lumbar radiculopathy [M54.16]   Meds Administered:  Meds ordered this encounter  Medications   methylPREDNISolone  acetate (DEPO-MEDROL ) injection 40 mg     Laterality: Right  Location/Site:  L3-4  Needle: 3.5 in., 20 ga. Tuohy  Needle Placement: Paramedian epidural  Findings:   -Comments: Excellent flow of contrast into the epidural space.  Several passing attempts were made and clear feel of epidural entry was made although not really good loss-of-resistance and really cannot see the flow contrast.  Ultimately did complete injection right at the facet joint and interlaminar space with good end feel but again suboptimal of contrast.  Procedure Details: Using a paramedian approach from the side mentioned above, the region overlying the inferior lamina was localized under fluoroscopic visualization and the soft tissues overlying this structure were infiltrated with 4 ml. of 1% Lidocaine  without Epinephrine . The Tuohy needle was inserted into the epidural space using a paramedian approach.   The epidural space was localized using loss of resistance along with counter oblique bi-planar fluoroscopic views.  After negative aspirate for air, blood, and CSF, a 2 ml. volume of Isovue -250 was injected into the epidural space and the flow of contrast was observed. Radiographs were obtained for documentation purposes.    The injectate was administered  into the level noted above.   Additional Comments:  The patient tolerated the procedure well Dressing: 2 x 2 sterile gauze and Band-Aid    Post-procedure details: Patient was observed during the procedure. Post-procedure instructions were reviewed.  Patient left the clinic in stable condition.

## 2024-04-16 ENCOUNTER — Inpatient Hospital Stay: Payer: Medicare HMO | Attending: Adult Health | Admitting: Adult Health

## 2024-04-16 ENCOUNTER — Encounter: Payer: Self-pay | Admitting: Adult Health

## 2024-04-16 ENCOUNTER — Other Ambulatory Visit: Payer: Self-pay | Admitting: Family Medicine

## 2024-04-16 VITALS — BP 168/70 | HR 64 | Temp 97.6°F | Resp 17 | Wt 141.1 lb

## 2024-04-16 DIAGNOSIS — Z170 Estrogen receptor positive status [ER+]: Secondary | ICD-10-CM

## 2024-04-16 DIAGNOSIS — M810 Age-related osteoporosis without current pathological fracture: Secondary | ICD-10-CM | POA: Diagnosis not present

## 2024-04-16 DIAGNOSIS — C50411 Malignant neoplasm of upper-outer quadrant of right female breast: Secondary | ICD-10-CM

## 2024-04-16 DIAGNOSIS — Z17411 Hormone receptor positive with human epidermal growth factor receptor 2 negative status: Secondary | ICD-10-CM | POA: Diagnosis not present

## 2024-04-16 DIAGNOSIS — Z79811 Long term (current) use of aromatase inhibitors: Secondary | ICD-10-CM | POA: Diagnosis not present

## 2024-04-16 MED ORDER — CLOTRIMAZOLE-BETAMETHASONE 1-0.05 % EX CREA: 1.0000 | TOPICAL_CREAM | Freq: Two times a day (BID) | CUTANEOUS | 0 refills | Status: AC

## 2024-04-16 NOTE — Progress Notes (Signed)
 [facility] Cancer Follow up:    Tower, Laine LABOR, MD [address] [place] [address]   DIAGNOSIS: Cancer Staging  Carcinoma of upper-outer quadrant of right breast in female, estrogen receptor positive (HCC) Staging form: Breast, AJCC 8th Edition - Clinical stage from 02/23/2019: cT1c, cN0, cM0, GX, ER+, PR+, HER2- - Signed by Izell Domino, MD on 02/23/2019 Stage prefix: Initial diagnosis Histologic grading system: 3 grade system - Pathologic stage from 04/07/2019: Stage IA (pT1c, pN0, cM0, G2, ER+, PR+, HER2-) - Signed by Crawford Morna Pickle, NP on 07/19/2019 Stage prefix: Initial diagnosis Histologic grading system: 3 grade system    SUMMARY OF ONCOLOGIC HISTORY: Oncology History  Carcinoma of upper-outer quadrant of right breast in female, estrogen receptor positive (HCC)  02/23/2019 Initial Diagnosis   Routine screening mammogram detected a 1.2cm mass in the right breast. Biopsy showed invasive mammary carcinoma with mammary carcinoma in situ, HER-2 - (1+), ER+ 100%, PR+ 5% Ki67 2%.    02/23/2019 Cancer Staging   Staging form: Breast, AJCC 8th Edition - Clinical stage from 02/23/2019: cT1c, cN0, cM0, GX, ER+, PR+, HER2-    04/07/2019 Cancer Staging   Staging form: Breast, AJCC 8th Edition - Pathologic stage from 04/07/2019: Stage IA (pT1c, pN0, cM0, G2, ER+, PR+, HER2-)    04/07/2019 Surgery   Right lumpectomy Osker) [phone]): IDC, grade 2, 1.3cm, with intermediate grade DCIS, clear margins. No regional lymph nodes were examined.   05/10/2019 - 05/28/2019 Radiation Therapy   The patient initially received a dose of 40.05 Gy in 15 fractions to the right breast and axilla using whole-breast tangent fields. This was delivered using a 3-D conformal technique. The pt did not receive a boost. The total dose was 40.05 Gy.   04/2019 - 04/2024 Anti-estrogen oral therapy   Anastrozole      CURRENT THERAPY:  INTERVAL HISTORY: Discussed the use  of AI scribe software for clinical note transcription with the patient, who gave verbal consent to proceed.  History of Present Illness Sara [name] is an 89 year old female who presents for follow-up after completing anastrozole  therapy.  She completed anastrozole  therapy this month following a lumpectomy and adjuvant radiation for breast cancer diagnosed in 2020. Her most recent mammogram was on March 23, 2024, with no new breast changes or issues noted.  She experiences soreness in her leg, which is increasing in size and is painful. The pain is described as burning, and she sometimes applies ice for relief. She occasionally uses a cream with antifungal and steroid properties.  She has osteoporosis and was recommended Prolia last year by endocrinology, however did not proceed with treatment at the time.  She does not recall this visit when I asked her about it.       Patient Active Problem List   Diagnosis Date Noted   Right leg pain 08/26/2023   Hemoptysis 08/06/2023   URI with cough and congestion 08/06/2023   Tongue lesion 02/11/2023   Allergic rhinitis 02/11/2023   Skin lump of arm, right 02/12/2022   Cerumen impaction 09/12/2021   Aortic root enlargement 01/09/2021   Neck pain 01/18/2020   Left shoulder pain 01/18/2020   Pain in both upper arms 01/18/2020   Thyroid  nodule 01/04/2020   Flank pain 11/22/2019   Hand pain, right 10/18/2019   Family history of breast cancer    Family history of prostate cancer    Family history of lung cancer    Carcinoma of upper-outer quadrant  of right breast in female, estrogen receptor positive (HCC) 02/23/2019   Pre-operative clearance 02/21/2019   History of breast cancer 02/21/2019   Medicare annual wellness visit, subsequent 01/05/2019   Encounter for screening mammogram for breast cancer 01/05/2019   Knee pain, left 10/14/2018   Arthritis of left hip 07/29/2018   Bradycardia 07/01/2018   Left ear pain 07/01/2018    Chronic diastolic heart failure (HCC) 12/27/2017   Lumbar spinal stenosis 03/11/2017   Diastolic dysfunction 01/14/2017   Estrogen deficiency 12/16/2016   Routine general medical examination at a health care facility 12/16/2016   CAD (coronary artery disease) 12/04/2016   Intertrigo 03/15/2016   Mucocele, 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  MEDICAL HISTORY: Past Medical History:  Diagnosis Date   Allergy    CAD (coronary artery disease)    Chronic diastolic (congestive) heart failure (HCC)    Clinical trial participant    [facility] Genetic studies in familial dementia   Dyspnea 11/28/2016   Family history of breast cancer    Family history of lung cancer    Family history of prostate cancer    Frequent UTI    GERD (gastroesophageal reflux disease)    Hypertension    Osteoarthritis    hips, back   Osteopenia    Pneumonia    Spinal stenosis    Stroke [facility]) 1985    SURGICAL HISTORY: Past Surgical History:  Procedure Laterality Date   ABDOMINAL HYSTERECTOMY     partial has one ovary   APPENDECTOMY     BREAST LUMPECTOMY WITH RADIOACTIVE SEED LOCALIZATION Right 04/07/2019   Procedure: RIGHT BREAST LUMPECTOMY WITH RADIOACTIVE SEED LOCALIZATION;  Surgeon: Curvin Deward MOULD, MD;  Location: [facility] OR;  Service: General;  Laterality: Right;   CATARACT EXTRACTION Bilateral    LEFT HEART CATH AND CORONARY ANGIOGRAPHY N/A 11/29/2016   Procedure: Left Heart Cath and Coronary Angiography;  Surgeon: Wonda Sharper, MD;  Location: [facility] INVASIVE CV LAB;  Service: Cardiovascular;  Laterality: N/A;   LUMBAR DISC SURGERY     x 3   rotator cuff  surgery Right 2010    SOCIAL HISTORY: Social History   Socioeconomic History   Marital status: Widowed    Spouse name: Not on file   Number of children: 6   Years of education: Not on file   Highest education level: Not on file  Occupational History   Occupation: RETIRED    Employer: RETIRED  Tobacco Use   Smoking status: Never   Smokeless tobacco: Never  Vaping Use   Vaping status: Never Used  Substance and Sexual Activity   Alcohol use: No    Alcohol/week: 0.0 standard drinks of alcohol   Drug use: No   Sexual activity: Not Currently  Other Topics Concern   Not on file  Social History Narrative   Not on file   Social Drivers of Health   Financial Resource Strain: Low Risk  (01/26/2024)   Overall Financial Resource Strain (CARDIA)    Difficulty of Paying Living Expenses: Not hard at all  Food Insecurity: No Food Insecurity (01/26/2024)   Hunger Vital Sign    Worried About Running Out of Food in the Last Year: Never true  Ran Out of Food in the Last Year: Never true  Transportation Needs: No Transportation Needs (01/26/2024)   PRAPARE - Administrator, Civil Service (Medical): No    Lack of Transportation (Non-Medical): No  Physical Activity: Insufficiently Active (01/26/2024)   Exercise Vital Sign    Days of Exercise per Week: 7 days    Minutes of Exercise per Session: 10 min  Stress: No Stress Concern Present (01/26/2024)   [employer] of Occupational Health - Occupational Stress Questionnaire    Feeling of Stress: Not at all  Social Connections: Moderately Integrated (01/26/2024)   Social Connection and Isolation Panel    Frequency of Communication with Friends and Family: More than three times a week    Frequency of Social Gatherings with Friends and Family: More than three times a week    Attends Religious Services: More than 4 times per year    Active Member of [employer] or Organizations: Yes    Attends Banker Meetings: Never     Marital Status: Widowed  Intimate Partner Violence: Not At Risk (01/26/2024)   Humiliation, Afraid, Rape, and Kick questionnaire    Fear of Current or Ex-Partner: No    Emotionally Abused: No    Physically Abused: No    Sexually Abused: No    FAMILY HISTORY: Family History  Problem Relation Age of Onset   Lung cancer Brother 61   Other Mother        kidney tumor   Hypertension Mother    Diabetes Sister    Diabetes Brother    Diabetes Sister        x 2   Colon cancer Maternal Uncle    Other Sister        intestine burst   Breast cancer Maternal Aunt        x 2   Breast cancer Daughter        unknown cancer   Diabetes Son        x 3   Diabetes Daughter    Breast cancer Sister        x2, diagnosed in 42s and 56s   Prostate cancer Maternal Uncle    Esophageal cancer Neg Hx    Rectal cancer Neg Hx    Stomach cancer Neg Hx     Review of Systems  Constitutional:  Negative for appetite change, chills, fatigue, fever and unexpected weight change.  HENT:   Negative for hearing loss, lump/mass and trouble swallowing.   Eyes:  Negative for eye problems and icterus.  Respiratory:  Negative for chest tightness, cough and shortness of breath.   Cardiovascular:  Negative for chest pain, leg swelling and palpitations.  Gastrointestinal:  Negative for abdominal distention, abdominal pain, constipation, diarrhea, nausea and vomiting.  Endocrine: Negative for hot flashes.  Genitourinary:  Negative for difficulty urinating.   Musculoskeletal:  Negative for arthralgias.  Skin:  Negative for itching and rash.  Neurological:  Negative for dizziness, extremity weakness, headaches and numbness.  Hematological:  Negative for adenopathy. Does not bruise/bleed easily.  Psychiatric/Behavioral:  Negative for depression. The patient is not nervous/anxious.       PHYSICAL EXAMINATION    Vitals:   04/16/24 1106  BP: (!) 168/70  Pulse: 64  Resp: 17  Temp: 97.6 F (36.4 C)  SpO2: 97%     Physical Exam Constitutional:      General: She is not in acute distress.    Appearance: Normal appearance. She is not toxic-appearing.  HENT:     Head: Normocephalic and atraumatic.     Mouth/Throat:     Mouth: Mucous membranes are moist.     Pharynx: Oropharynx is clear. No oropharyngeal exudate or posterior oropharyngeal erythema.  Eyes:     General: No scleral icterus. Cardiovascular:     Rate and Rhythm: Normal rate and regular rhythm.     Pulses: Normal pulses.     Heart sounds: Normal heart sounds.  Pulmonary:     Effort: Pulmonary effort is normal.     Breath sounds: Normal breath sounds.  Chest:     Comments: Right breast s/p lumpectomy and radiation, no sign of local recurrence, left breast benign Abdominal:     General: Abdomen is flat. Bowel sounds are normal. There is no distension.     Palpations: Abdomen is soft.     Tenderness: There is no abdominal tenderness.  Musculoskeletal:        General: No swelling.     Cervical back: Neck supple.  Lymphadenopathy:     Cervical: No cervical adenopathy.     Upper Body:     Right upper body: No supraclavicular or axillary adenopathy.     Left upper body: No supraclavicular or axillary adenopathy.  Skin:    General: Skin is warm and dry.     Findings: No rash.  Neurological:     General: No focal deficit present.     Mental Status: She is alert.  Psychiatric:        Mood and Affect: Mood normal.        Behavior: Behavior normal.      ASSESSMENT and THERAPY PLAN:   Assessment and Plan Assessment & Plan History of right breast cancer, status post lumpectomy and adjuvant therapy Recent mammogram on October 14th, 2025, showed no evidence of malignancy. - Continue annual mammograms. - Monitor for any new breast changes or issues.  Chronic pruritic and enlarging lesion of leg, possible dermatitis or fungal infection Differential includes inflammation, allergic reaction, or fungal infection. - Prescribed  Lotrisone  cream - Advised to monitor the lesion and report if it does not improve. - Will consider referral to dermatology if the lesion continues to grow.  Osteoporosis Significant osteoporosis with previous recommendation for Prolia injection by endocrinology. She previously expressed concern about potential side effects, including achiness in the bones after the first shot. Oral medications for osteoporosis have been associated with allergic reactions. Discussed the importance of preventing fractures, especially given her age, as a hip fracture can be life-threatening. - Recommended contacting endocrinology to revisit the discussion about Prolia injection--I wrote down their office number for her to call - Emphasized the importance of preventing fractures due to the risk associated with her age.   RTC in 1 year for long term f/u.    All questions were answered. The patient knows to call the clinic with any problems, questions or concerns. We can certainly see the patient much sooner if necessary.  Total encounter time:30 minutes*in face-to-face visit time, chart review, lab review, care coordination, order entry, and documentation of the encounter time.    Morna Kendall, NP 04/16/24 11:15 AM Medical Oncology and Hematology [facility] [address] [place], [place] [address] Tel. [phone]    Fax. [phone]  *Total Encounter Time as defined by the Centers for Medicare and Medicaid Services includes, in addition to the face-to-face time of a patient visit (documented in the note above) non-face-to-face time: obtaining and reviewing outside history, ordering  and reviewing medications, tests or procedures, care coordination (communications with other health care professionals or caregivers) and documentation in the medical record.

## 2024-05-11 DIAGNOSIS — I10 Essential (primary) hypertension: Secondary | ICD-10-CM | POA: Diagnosis not present

## 2024-05-11 DIAGNOSIS — M8580 Other specified disorders of bone density and structure, unspecified site: Secondary | ICD-10-CM | POA: Diagnosis not present

## 2024-05-11 DIAGNOSIS — E049 Nontoxic goiter, unspecified: Secondary | ICD-10-CM | POA: Diagnosis not present

## 2024-05-11 DIAGNOSIS — Z9989 Dependence on other enabling machines and devices: Secondary | ICD-10-CM | POA: Diagnosis not present

## 2024-05-11 DIAGNOSIS — Z853 Personal history of malignant neoplasm of breast: Secondary | ICD-10-CM | POA: Diagnosis not present

## 2024-05-11 DIAGNOSIS — Z7982 Long term (current) use of aspirin: Secondary | ICD-10-CM | POA: Diagnosis not present

## 2024-05-12 ENCOUNTER — Encounter: Payer: Self-pay | Admitting: Family Medicine

## 2024-05-12 ENCOUNTER — Ambulatory Visit: Admitting: Family Medicine

## 2024-05-12 ENCOUNTER — Ambulatory Visit: Payer: Self-pay | Admitting: Family Medicine

## 2024-05-12 VITALS — BP 136/70 | HR 55 | Temp 98.2°F | Ht 61.0 in | Wt 140.2 lb

## 2024-05-12 DIAGNOSIS — J301 Allergic rhinitis due to pollen: Secondary | ICD-10-CM | POA: Diagnosis not present

## 2024-05-12 DIAGNOSIS — R5382 Chronic fatigue, unspecified: Secondary | ICD-10-CM | POA: Diagnosis not present

## 2024-05-12 DIAGNOSIS — H938X3 Other specified disorders of ear, bilateral: Secondary | ICD-10-CM | POA: Insufficient documentation

## 2024-05-12 DIAGNOSIS — H6123 Impacted cerumen, bilateral: Secondary | ICD-10-CM | POA: Diagnosis not present

## 2024-05-12 DIAGNOSIS — R7303 Prediabetes: Secondary | ICD-10-CM

## 2024-05-12 DIAGNOSIS — R5383 Other fatigue: Secondary | ICD-10-CM | POA: Insufficient documentation

## 2024-05-12 DIAGNOSIS — I5032 Chronic diastolic (congestive) heart failure: Secondary | ICD-10-CM

## 2024-05-12 DIAGNOSIS — E7800 Pure hypercholesterolemia, unspecified: Secondary | ICD-10-CM

## 2024-05-12 DIAGNOSIS — M542 Cervicalgia: Secondary | ICD-10-CM | POA: Diagnosis not present

## 2024-05-12 DIAGNOSIS — M48061 Spinal stenosis, lumbar region without neurogenic claudication: Secondary | ICD-10-CM | POA: Diagnosis not present

## 2024-05-12 DIAGNOSIS — I10 Essential (primary) hypertension: Secondary | ICD-10-CM

## 2024-05-12 LAB — COMPREHENSIVE METABOLIC PANEL WITH GFR
ALT: 13 U/L (ref 0–35)
AST: 18 U/L (ref 0–37)
Albumin: 4.4 g/dL (ref 3.5–5.2)
Alkaline Phosphatase: 60 U/L (ref 39–117)
BUN: 19 mg/dL (ref 6–23)
CO2: 31 meq/L (ref 19–32)
Calcium: 10 mg/dL (ref 8.4–10.5)
Chloride: 102 meq/L (ref 96–112)
Creatinine, Ser: 0.79 mg/dL (ref 0.40–1.20)
GFR: 66.04 mL/min (ref >60.00)
Glucose, Bld: 99 mg/dL (ref 70–99)
Potassium: 3.7 meq/L (ref 3.5–5.1)
Sodium: 141 meq/L (ref 135–145)
Total Bilirubin: 0.6 mg/dL (ref 0.2–1.2)
Total Protein: 7.1 g/dL (ref 6.0–8.3)

## 2024-05-12 LAB — CBC WITH DIFFERENTIAL/PLATELET
Basophils Absolute: 0 K/uL (ref 0.0–0.1)
Basophils Relative: 0.9 % (ref 0.0–3.0)
Eosinophils Absolute: 0.1 K/uL (ref 0.0–0.7)
Eosinophils Relative: 1.1 % (ref 0.0–5.0)
HCT: 40.5 % (ref 36.0–46.0)
Hemoglobin: 13.2 g/dL (ref 12.0–15.0)
Lymphocytes Relative: 28.4 % (ref 12.0–46.0)
Lymphs Abs: 1.6 K/uL (ref 0.7–4.0)
MCHC: 32.5 g/dL (ref 30.0–36.0)
MCV: 87.6 fl (ref 78.0–100.0)
Monocytes Absolute: 0.5 K/uL (ref 0.1–1.0)
Monocytes Relative: 9.4 % (ref 3.0–12.0)
Neutro Abs: 3.4 K/uL (ref 1.4–7.7)
Neutrophils Relative %: 60.2 % (ref 43.0–77.0)
Platelets: 186 K/uL (ref 150.0–400.0)
RBC: 4.63 Mil/uL (ref 3.87–5.11)
RDW: 13.7 % (ref 11.5–15.5)
WBC: 5.6 K/uL (ref 4.0–10.5)

## 2024-05-12 LAB — LIPID PANEL
Cholesterol: 195 mg/dL (ref 0–200)
HDL: 66.7 mg/dL (ref >39.00)
LDL Cholesterol: 110 mg/dL — ABNORMAL HIGH (ref 0–99)
NonHDL: 128.44
Total CHOL/HDL Ratio: 3
Triglycerides: 90 mg/dL (ref 0.0–149.0)
VLDL: 18 mg/dL (ref 0.0–40.0)

## 2024-05-12 LAB — HEMOGLOBIN A1C: Hgb A1c MFr Bld: 5.9 % (ref 4.6–6.5)

## 2024-05-12 LAB — SEDIMENTATION RATE: Sed Rate: 13 mm/h (ref 0–30)

## 2024-05-12 LAB — TSH: TSH: 1.36 u[IU]/mL (ref 0.35–5.50)

## 2024-05-12 MED ORDER — FLUTICASONE PROPIONATE 50 MCG/ACT NA SUSP: 2.0000 | Freq: Every day | NASAL | 6 refills | Status: AC

## 2024-05-12 NOTE — Patient Instructions (Addendum)
 Try and eat regularly  Get lean protein with every meal   The following are examples of protein in diet  Meat (lean)  Fish  Eggs  Dairy products  Soy products  Oat milk  Almond milk Nuts and nut butters  legumes Dried beans   Avoid added sugars in your diet when you can  Try to get most of your carbohydrates from produce (with the exception of white potatoes) and whole grains Eat less bread/pasta/rice/snack foods/cereals/sweets and other items from the middle of the grocery store (processed carbs)  Be as active as you can be   Ears look ok- not enough ear wax to flush out today  Sometimes ear fullness can come from nasal congestion  I sent in flonase nasal spray to dry   Touch base with orthopedics about neck and leg pain   Lab today for fatigue

## 2024-05-12 NOTE — Progress Notes (Signed)
 Subjective:    Patient ID: Sara [name], female    DOB: 05/25/1932, 89 y.o.   MRN: [id number]  HPI  Wt Readings from Last 3 Encounters:  05/12/24 140 lb 4 oz (63.6 kg)  04/16/24 141 lb 1.6 oz (64 kg)  01/26/24 145 lb (65.8 kg)   26.50 kg/m  Vitals:   05/12/24 1013 05/12/24 1042  BP: (!) 146/80 136/70  Pulse: (!) 55   Temp: 98.2 F (36.8 C)   SpO2: 96%    Pt presents for c/o Ear fullness Neck pain  Fatigue  HTN   Fatigue  About 6 months  Unsure if age related  Has to take more breaks  Does get sleepy and takes naps   Tries to get the same amount done every day    Still has leg pain  Treating her back  Shots have not helped   Still has neck pain     HTN bp is stable today  No cp or palpitations or headaches or edema  No side effects to medicines  BP Readings from Last 3 Encounters:  05/12/24 136/70  04/16/24 (!) 168/70  04/12/24 (!) 169/90     Lab Results  Component Value Date   NA 140 02/11/2023   K 4.2 02/11/2023   CO2 29 02/11/2023   GLUCOSE 85 02/11/2023   BUN 18 02/11/2023   CREATININE 0.84 02/11/2023   CALCIUM  10.2 02/11/2023   GFR 61.89 02/11/2023   GFRNONAA >60 02/08/2023   Diltiazem  120 mg daily  Hydrochlorothiazide  25 mg daily     CS xray 2023  Cervical spine 2 views: No acute fractures.  Diffuse degenerative changes  with anterior endplate spurring.  This space overall well-maintained.   Loss of lordotic curvature.   Lab Results  Component Value Date   WBC 6.4 04/16/2023   HGB 13.6 04/16/2023   HCT 43.2 04/16/2023   MCV 89.1 04/16/2023   PLT 215 04/16/2023   Lab Results  Component Value Date   ALT 13 02/11/2023   AST 17 02/11/2023   ALKPHOS 82 02/11/2023   BILITOT 0.6 02/11/2023   Lab Results  Component Value Date   TSH 1.30 02/11/2023   No results found for: IRON, TIBC, FERRITIN Lab Results  Component Value Date   ESRSEDRATE 10 03/08/2010   Lab Results  Component Value Date    VITAMINB12 546 04/16/2023     Patient Active Problem List   Diagnosis Date Noted   Fatigue 05/12/2024   Ear pressure, bilateral 05/12/2024   Right leg pain 08/26/2023   Tongue lesion 02/11/2023   Allergic rhinitis 02/11/2023   Skin lump of arm, right 02/12/2022   Cerumen impaction 09/12/2021   Aortic root enlargement 01/09/2021   Neck pain 01/18/2020   Pain in both upper arms 01/18/2020   Thyroid  nodule 01/04/2020   Hand pain, right 10/18/2019   Family history of breast cancer    Family history of prostate cancer    Family history of lung cancer    Carcinoma of upper-outer quadrant of right breast in female, estrogen receptor positive (HCC) 02/23/2019   History of breast cancer 02/21/2019   Encounter for screening mammogram for breast cancer 01/05/2019   Knee pain, left 10/14/2018   Arthritis of left hip 07/29/2018   Bradycardia 07/01/2018   Chronic diastolic heart failure (HCC) 12/27/2017   Lumbar spinal stenosis 03/11/2017   Diastolic dysfunction 01/14/2017   Estrogen deficiency 12/16/2016   Routine general medical examination at a health care facility  12/16/2016   CAD (coronary artery disease) 12/04/2016   Intertrigo 03/15/2016   Mucocele, 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   Eczema 03/06/2015   Prediabetes 02/11/2014   CONSTIPATION, CHRONIC 07/03/2010   Osteoporosis 06/24/2008   HYPERCHOLESTEROLEMIA 12/08/2006   Essential hypertension 12/08/2006   GERD 12/08/2006   H/O herpes zoster 12/08/2006   Past Medical History:  Diagnosis Date   Allergy    CAD (coronary artery disease)    Chronic diastolic (congestive) heart failure (HCC)    Clinical trial participant    [facility] Genetic studies in familial dementia   Dyspnea 11/28/2016   Family history of breast cancer    Family history of lung cancer    Family history of prostate cancer    Frequent UTI    GERD (gastroesophageal reflux disease)    Hypertension    Osteoarthritis    hips, back   Osteopenia    Pneumonia     Spinal stenosis    Stroke (HCC) 1985   Past Surgical History:  Procedure Laterality Date   ABDOMINAL HYSTERECTOMY     partial has one ovary   APPENDECTOMY     BREAST LUMPECTOMY WITH RADIOACTIVE SEED LOCALIZATION Right 04/07/2019   Procedure: RIGHT BREAST LUMPECTOMY WITH RADIOACTIVE SEED LOCALIZATION;  Surgeon: Curvin Deward MOULD, MD;  Location: [facility] OR;  Service: General;  Laterality: Right;   CATARACT EXTRACTION Bilateral    LEFT HEART CATH AND CORONARY ANGIOGRAPHY N/A 11/29/2016   Procedure: Left Heart Cath and Coronary Angiography;  Surgeon: Wonda Sharper, MD;  Location: [facility] INVASIVE CV LAB;  Service: Cardiovascular;  Laterality: N/A;   LUMBAR DISC SURGERY     x 3   rotator cuff surgery Right 2010   Social History   Tobacco Use   Smoking status: Never   Smokeless tobacco: Never  Vaping Use   Vaping status: Never Used  Substance Use Topics   Alcohol use: No    Alcohol/week: 0.0 standard drinks of alcohol   Drug use: No   Family History  Problem Relation Age of Onset   Lung cancer Brother 59   Other Mother        kidney tumor   Hypertension Mother    Diabetes Sister    Diabetes Brother    Diabetes Sister        x 2   Colon cancer Maternal Uncle    Other Sister        intestine burst   Breast cancer Maternal Aunt        x 2   Breast cancer Daughter        unknown cancer   Diabetes Son        x 3   Diabetes Daughter    Breast cancer Sister        x2, diagnosed in 49s and 16s   Prostate cancer Maternal Uncle    Esophageal cancer Neg Hx    Rectal cancer Neg Hx    Stomach cancer Neg Hx    Allergies  Allergen Reactions   Amoxicillin     Reaction not known   Atorvastatin      aching   Cefpodoxime Proxetil     Reaction not known   Clarithromycin     Reaction not known   Fosamax  [Alendronate  Sodium]     Body pain    Furosemide     REACTION: doesn't help   Gabapentin Swelling    Caused severe swellling   Current Outpatient Medications on File Prior to Visit   Medication  Sig Dispense Refill   acetaminophen  (TYLENOL ) 500 MG tablet Take 1,000 mg by mouth every 6 (six) hours as needed.     aspirin  EC 81 MG tablet Take 81 mg by mouth daily.       Cholecalciferol  (VITAMIN D -3) 1000 UNITS CAPS Take 1,000 Units by mouth daily.      clotrimazole -betamethasone  (LOTRISONE ) cream Apply 1 Application topically 2 (two) times daily. 30 g 0   diltiazem  (CARDIZEM  CD) 120 MG 24 hr capsule TAKE 1 CAPSULE EVERY DAY 90 capsule 1   doxylamine, Sleep, (UNISOM) 25 MG tablet Take 25 mg by mouth at bedtime as needed for sleep.      hydrochlorothiazide  (HYDRODIURIL ) 25 MG tablet TAKE 1 TABLET EVERY DAY 90 tablet 1   nitroGLYCERIN  (NITROSTAT ) 0.4 MG SL tablet Place 1 tablet (0.4 mg total) under the tongue every 5 (five) minutes as needed for chest pain (max 3 doses in 15 minutes). 25 tablet 3   nystatin  cream (MYCOSTATIN ) Apply 1 Application topically 2 (two) times daily as needed for dry skin. To affected itchy areas 30 g 0   polyethylene glycol (MIRALAX  / GLYCOLAX ) packet Take 17 g by mouth daily.      potassium chloride  (KLOR-CON ) 10 MEQ tablet TAKE 1 TABLET EVERY DAY 90 tablet 1   traMADol  (ULTRAM ) 50 MG tablet Take 1 tablet (50 mg total) by mouth every 8 (eight) hours as needed. 20 tablet 0   vitamin B-12 (CYANOCOBALAMIN ) 1000 MCG tablet Take 1,000 mcg by mouth daily.     No current facility-administered medications on file prior to visit.    Review of Systems  Constitutional:  Positive for fatigue. Negative for activity change, appetite change, fever and unexpected weight change.  HENT:  Positive for congestion and hearing loss. Negative for ear discharge, ear pain, rhinorrhea, sinus pressure and sore throat.   Eyes:  Negative for pain, redness and visual disturbance.  Respiratory:  Negative for cough, shortness of breath and wheezing.   Cardiovascular:  Negative for chest pain and palpitations.  Gastrointestinal:  Negative for abdominal pain, blood in stool,  constipation and diarrhea.  Endocrine: Negative for polydipsia and polyuria.  Genitourinary:  Negative for dysuria, frequency and urgency.  Musculoskeletal:  Positive for arthralgias, back pain, myalgias and neck pain. Negative for joint swelling.  Skin:  Negative for pallor and rash.  Allergic/Immunologic: Negative for environmental allergies.  Neurological:  Negative for dizziness, syncope and headaches.  Hematological:  Negative for adenopathy. Does not bruise/bleed easily.  Psychiatric/Behavioral:  Negative for decreased concentration and dysphoric mood. The patient is not nervous/anxious.        Objective:   Physical Exam Constitutional:      General: She is not in acute distress.    Appearance: Normal appearance. She is well-developed and normal weight. She is not ill-appearing or diaphoretic.  HENT:     Head: Normocephalic and atraumatic.     Right Ear: Tympanic membrane normal.     Left Ear: Tympanic membrane normal.     Ears:     Comments: Scant cerumen at entrance of each ear TMs are slightly dull   Has hearing aides that work well    Nose: Congestion present.     Comments: Some clear rhinorrhea     Mouth/Throat:     Mouth: Mucous membranes are moist.     Pharynx: Oropharynx is clear.  Eyes:     Conjunctiva/sclera: Conjunctivae normal.     Pupils: Pupils are equal, round, and reactive to light.  Neck:     Thyroid : No thyromegaly.     Vascular: No carotid bruit or JVD.     Comments: Some tenderness in lower cervical musculature and trapezius bilat   Some pain with full extension  No bony tenderness  Cardiovascular:     Rate and Rhythm: Normal rate and regular rhythm.     Heart sounds: Normal heart sounds.     No gallop.  Pulmonary:     Effort: Pulmonary effort is normal. No respiratory distress.     Breath sounds: Normal breath sounds. No wheezing or rales.  Abdominal:     General: There is no distension or abdominal bruit.     Palpations: Abdomen is soft.   Musculoskeletal:     Cervical back: Normal range of motion and neck supple. Tenderness present.     Right lower leg: No edema.     Left lower leg: No edema.  Lymphadenopathy:     Cervical: No cervical adenopathy.  Skin:    General: Skin is warm and dry.     Coloration: Skin is not pale.     Findings: No rash.  Neurological:     Mental Status: She is alert.     Cranial Nerves: No cranial nerve deficit.     Coordination: Coordination normal.     Deep Tendon Reflexes: Reflexes are normal and symmetric. Reflexes normal.  Psychiatric:        Mood and Affect: Mood normal.           Assessment & Plan:   Problem List Items Addressed This Visit       Cardiovascular and Mediastinum   Essential hypertension   bp in fair control at this time  BP Readings from Last 1 Encounters:  05/12/24 136/70    Most recent labs reviewed  Disc lifstyle change with low sodium diet and exercise  Labs ordered  Plan to continue Diltiazem  120 mg daily  hctz 25 mg daily        Chronic diastolic heart failure (HCC)   No clinical changes Reassuring exam  Has cardiology care         Respiratory   Allergic rhinitis   This and some nasal congestion may be causing sensation of ear fullness Sent flonase to pharmacy to try  Update if not starting to improve in a week or if worsening  Call back and Er precautions noted in detail today          Nervous and Auditory   Cerumen impaction   Today not bad-small amount of cerumen at entrance to ears  No impaction  Using hearing aides  Will continue to monitor         Other   Prediabetes   A1c ordered  Per pt -may have had more sugar in diet lately   disc imp of low glycemic diet and wt loss to prevent DM2   Encouraged more protein in diet       Relevant Orders   Hemoglobin A1c   Neck pain   Chronic Neck and shoulder girdle Reviewed last xray-deg change Urged to d/w ortho and perhaps consider PT Did add esr to lab today (doubt  PMR but in the differential)       Relevant Orders   Sedimentation Rate   Lumbar spinal stenosis   Ongoing  Causing leg pain  Continues ortho care with injections       HYPERCHOLESTEROLEMIA   Disc goals for lipids and reasons to control them Rev  last labs with pt Rev low sat fat diet in detail  Lab today  Intol of statins       Relevant Orders   Comprehensive metabolic panel with GFR   Lipid panel   Fatigue - Primary   Suspect multi factorial Age and chronic pain may play a role Reassuring exam and vitals  Lab today      Relevant Orders   CBC with Differential/Platelet   Comprehensive metabolic panel with GFR   TSH   Ear pressure, bilateral   Suspect some intermittent ETD Not much cerumen Prescription flonase to try   Update if not starting to improve in a week or if worsening  Call back and Er precautions noted in detail today

## 2024-05-12 NOTE — Assessment & Plan Note (Signed)
 Suspect multi factorial Age and chronic pain may play a role Reassuring exam and vitals  Lab today

## 2024-05-12 NOTE — Assessment & Plan Note (Signed)
 No clinical changes Reassuring exam  Has cardiology care

## 2024-05-12 NOTE — Assessment & Plan Note (Signed)
 Ongoing  Causing leg pain  Continues ortho care with injections

## 2024-05-12 NOTE — Assessment & Plan Note (Signed)
 bp in fair control at this time  BP Readings from Last 1 Encounters:  05/12/24 136/70    Most recent labs reviewed  Disc lifstyle change with low sodium diet and exercise  Labs ordered  Plan to continue Diltiazem  120 mg daily  hctz 25 mg daily

## 2024-05-12 NOTE — Assessment & Plan Note (Signed)
 This and some nasal congestion may be causing sensation of ear fullness Sent flonase to pharmacy to try  Update if not starting to improve in a week or if worsening  Call back and Er precautions noted in detail today

## 2024-05-12 NOTE — Assessment & Plan Note (Signed)
 Suspect some intermittent ETD Not much cerumen Prescription flonase to try   Update if not starting to improve in a week or if worsening  Call back and Er precautions noted in detail today

## 2024-05-12 NOTE — Assessment & Plan Note (Signed)
 Today not bad-small amount of cerumen at entrance to ears  No impaction  Using hearing aides  Will continue to monitor

## 2024-05-12 NOTE — Assessment & Plan Note (Signed)
 Chronic Neck and shoulder girdle Reviewed last xray-deg change Urged to d/w ortho and perhaps consider PT Did add esr to lab today (doubt PMR but in the differential)

## 2024-05-12 NOTE — Assessment & Plan Note (Signed)
 Disc goals for lipids and reasons to control them Rev last labs with pt Rev low sat fat diet in detail  Lab today  Intol of statins

## 2024-05-12 NOTE — Assessment & Plan Note (Signed)
 A1c ordered  Per pt -may have had more sugar in diet lately   disc imp of low glycemic diet and wt loss to prevent DM2   Encouraged more protein in diet

## 2024-05-14 ENCOUNTER — Telehealth: Payer: Self-pay | Admitting: *Deleted

## 2024-05-14 MED ORDER — POTASSIUM CHLORIDE ER 10 MEQ PO TBCR: 10.0000 meq | EXTENDED_RELEASE_TABLET | Freq: Every day | ORAL | 1 refills | Status: AC

## 2024-05-14 NOTE — Telephone Encounter (Signed)
 Copied from CRM #[id number]. Topic: Clinical - Prescription Issue >> May 14, 2024  9:04 AM Rosina BIRCH wrote: Reason for CRM: patient called stating she could have made a mistake and crossed out her Totassium medication. Patient stated she need a refill on it sent to [facility] and she want the medication put back on her list. [employer] stated they can not send it anymore until they receive a request and the patient think the provider want  her to continue to take it  [phone]

## 2024-05-14 NOTE — Telephone Encounter (Signed)
 Potassium was never removed from med list, Rx refilled

## 2024-06-28 ENCOUNTER — Other Ambulatory Visit: Payer: Self-pay | Admitting: Family Medicine

## 2024-07-16 ENCOUNTER — Ambulatory Visit: Payer: Self-pay

## 2024-07-16 NOTE — Telephone Encounter (Signed)
 Appt scheduled with PCP on 07/19/24

## 2024-07-16 NOTE — Telephone Encounter (Signed)
 FYI Only or Action Required?: FYI only for provider: appointment scheduled on 07/19/24.  Patient was last seen in primary care on 05/12/2024 by Randeen Laine LABOR, MD.  Called Nurse Triage reporting Fatigue.  Symptoms began about a month ago.  Interventions attempted: Nothing.  Symptoms are: stable.  Triage Disposition: See PCP When Office is Open (Within 3 Days)  Patient/caregiver understands and will follow disposition?: Yes   Reason for Disposition  [1] Fatigue (i.e., tires easily, decreased energy) AND [2] persists > 1 week  Answer Assessment - Initial Assessment Questions 1. DESCRIPTION: Describe how you are feeling.     Fatigue, tired, no energy  2. SEVERITY: How bad is it?  Can you stand and walk?     Feels like it worsens sometimes  3. ONSET: When did these symptoms begin? (e.g., hours, days, weeks, months)     A month or longer  4. CAUSE: What do you think is causing the weakness or fatigue? (e.g., not drinking enough fluids, medical problem, trouble sleeping)     Thyroid   5. NEW MEDICINES:  Have you started on any new medicines recently? (e.g., opioid pain medicines, benzodiazepines, muscle relaxants, antidepressants, antihistamines, neuroleptics, beta blockers)     hydrochlorothiazide  (HYDRODIURIL ) 25 MG tablet, diltiazem  (CARDIZEM  CD) 120 MG 24 hr capsul  6. OTHER SYMPTOMS: Do you have any other symptoms? (e.g., chest pain, fever, cough, SOB, vomiting, diarrhea, bleeding, other areas of pain)     Neck discomfort, noticing a grape sized lump on left side of neck.  Protocols used: Weakness (Generalized) and Fatigue-A-AH  Message from [place] J sent at 07/16/2024 10:19 AM EST  Reason for Triage: Patient has been feeling very weak and fatigued. She is also short of breathe but does not have any chest pains.

## 2024-07-16 NOTE — Telephone Encounter (Signed)
 Will see patient then Agree with ER and UC precautions

## 2024-07-19 ENCOUNTER — Ambulatory Visit: Admitting: Family Medicine

## 2025-04-19 ENCOUNTER — Inpatient Hospital Stay: Admitting: Adult Health
